# Patient Record
Sex: Female | Born: 1935 | Race: Black or African American | Hispanic: No | Marital: Single | Smoking: Never smoker
Health system: Southern US, Community
[De-identification: ages and names within clinical notes are randomized; demographics above are authoritative.]

## PROBLEM LIST (undated history)

## (undated) DIAGNOSIS — E7800 Pure hypercholesterolemia, unspecified: Secondary | ICD-10-CM

## (undated) DIAGNOSIS — E119 Type 2 diabetes mellitus without complications: Secondary | ICD-10-CM

## (undated) DIAGNOSIS — D136 Benign neoplasm of pancreas: Secondary | ICD-10-CM

## (undated) DIAGNOSIS — M1990 Unspecified osteoarthritis, unspecified site: Secondary | ICD-10-CM

## (undated) DIAGNOSIS — F411 Generalized anxiety disorder: Secondary | ICD-10-CM

## (undated) DIAGNOSIS — E669 Obesity, unspecified: Secondary | ICD-10-CM

## (undated) DIAGNOSIS — I440 Atrioventricular block, first degree: Secondary | ICD-10-CM

## (undated) DIAGNOSIS — D649 Anemia, unspecified: Secondary | ICD-10-CM

## (undated) DIAGNOSIS — K5730 Diverticulosis of large intestine without perforation or abscess without bleeding: Secondary | ICD-10-CM

## (undated) DIAGNOSIS — G43009 Migraine without aura, not intractable, without status migrainosus: Secondary | ICD-10-CM

## (undated) DIAGNOSIS — I10 Essential (primary) hypertension: Secondary | ICD-10-CM

## (undated) DIAGNOSIS — K219 Gastro-esophageal reflux disease without esophagitis: Secondary | ICD-10-CM

## (undated) DIAGNOSIS — I4892 Unspecified atrial flutter: Secondary | ICD-10-CM

## (undated) DIAGNOSIS — G4733 Obstructive sleep apnea (adult) (pediatric): Secondary | ICD-10-CM

## (undated) DIAGNOSIS — E559 Vitamin D deficiency, unspecified: Secondary | ICD-10-CM

## (undated) DIAGNOSIS — I509 Heart failure, unspecified: Secondary | ICD-10-CM

## (undated) DIAGNOSIS — J411 Mucopurulent chronic bronchitis: Secondary | ICD-10-CM

## (undated) DIAGNOSIS — K7460 Unspecified cirrhosis of liver: Secondary | ICD-10-CM

## (undated) DIAGNOSIS — D472 Monoclonal gammopathy: Secondary | ICD-10-CM

## (undated) DIAGNOSIS — H269 Unspecified cataract: Secondary | ICD-10-CM

## (undated) DIAGNOSIS — N189 Chronic kidney disease, unspecified: Secondary | ICD-10-CM

## (undated) HISTORY — DX: Generalized anxiety disorder: F41.1

## (undated) HISTORY — DX: Vitamin D deficiency, unspecified: E55.9

## (undated) HISTORY — DX: Obstructive sleep apnea (adult) (pediatric): G47.33

## (undated) HISTORY — DX: Migraine without aura, not intractable, without status migrainosus: G43.009

## (undated) HISTORY — DX: Unspecified osteoarthritis, unspecified site: M19.90

## (undated) HISTORY — DX: Monoclonal gammopathy: D47.2

## (undated) HISTORY — DX: Diverticulosis of large intestine without perforation or abscess without bleeding: K57.30

## (undated) HISTORY — PX: COLONOSCOPY: SHX174

## (undated) HISTORY — DX: Obesity, unspecified: E66.9

## (undated) HISTORY — DX: Mucopurulent chronic bronchitis: J41.1

## (undated) HISTORY — DX: Type 2 diabetes mellitus without complications: E11.9

## (undated) HISTORY — DX: Benign neoplasm of pancreas: D13.6

## (undated) HISTORY — DX: Pure hypercholesterolemia, unspecified: E78.00

## (undated) HISTORY — DX: Essential (primary) hypertension: I10

## (undated) HISTORY — DX: Anemia, unspecified: D64.9

---

## 1970-02-26 HISTORY — PX: ABDOMINAL HYSTERECTOMY: SHX81

## 2001-11-11 ENCOUNTER — Inpatient Hospital Stay (HOSPITAL_COMMUNITY): Admission: EM | Admit: 2001-11-11 | Discharge: 2001-11-15 | Payer: Self-pay | Admitting: Emergency Medicine

## 2001-11-11 ENCOUNTER — Encounter: Payer: Self-pay | Admitting: Pulmonary Disease

## 2001-11-11 ENCOUNTER — Encounter: Payer: Self-pay | Admitting: Emergency Medicine

## 2001-11-12 ENCOUNTER — Encounter: Payer: Self-pay | Admitting: Internal Medicine

## 2001-11-13 ENCOUNTER — Encounter: Payer: Self-pay | Admitting: Internal Medicine

## 2001-11-14 ENCOUNTER — Encounter: Payer: Self-pay | Admitting: Internal Medicine

## 2001-11-14 ENCOUNTER — Encounter (INDEPENDENT_AMBULATORY_CARE_PROVIDER_SITE_OTHER): Payer: Self-pay | Admitting: *Deleted

## 2002-02-26 HISTORY — PX: OTHER SURGICAL HISTORY: SHX169

## 2004-01-06 ENCOUNTER — Ambulatory Visit: Payer: Self-pay | Admitting: Pulmonary Disease

## 2004-01-27 ENCOUNTER — Ambulatory Visit: Payer: Self-pay | Admitting: Adult Health

## 2004-02-17 ENCOUNTER — Ambulatory Visit: Payer: Self-pay | Admitting: Pulmonary Disease

## 2004-03-02 ENCOUNTER — Ambulatory Visit: Payer: Self-pay | Admitting: Pulmonary Disease

## 2004-04-27 ENCOUNTER — Ambulatory Visit: Payer: Self-pay | Admitting: Pulmonary Disease

## 2004-09-07 ENCOUNTER — Ambulatory Visit: Payer: Self-pay | Admitting: Pulmonary Disease

## 2005-01-04 ENCOUNTER — Ambulatory Visit: Payer: Self-pay | Admitting: Pulmonary Disease

## 2005-01-08 ENCOUNTER — Ambulatory Visit: Payer: Self-pay | Admitting: Pulmonary Disease

## 2005-02-01 ENCOUNTER — Ambulatory Visit: Payer: Self-pay | Admitting: Pulmonary Disease

## 2005-07-19 ENCOUNTER — Ambulatory Visit: Payer: Self-pay | Admitting: Pulmonary Disease

## 2005-07-30 ENCOUNTER — Ambulatory Visit: Payer: Self-pay | Admitting: Pulmonary Disease

## 2006-01-24 ENCOUNTER — Ambulatory Visit: Payer: Self-pay | Admitting: Pulmonary Disease

## 2006-01-24 LAB — CONVERTED CEMR LAB
Bacteria, U Microscopic: NEGATIVE /hpf
Crystals: NEGATIVE
Nitrite: NEGATIVE
Urine Glucose: NEGATIVE mg/dL
Urobilinogen, UA: 0.2 (ref 0.0–1.0)

## 2006-01-31 ENCOUNTER — Ambulatory Visit: Payer: Self-pay | Admitting: Pulmonary Disease

## 2006-04-18 ENCOUNTER — Encounter: Admission: RE | Admit: 2006-04-18 | Discharge: 2006-04-18 | Payer: Self-pay | Admitting: Orthopedic Surgery

## 2006-08-12 ENCOUNTER — Ambulatory Visit: Payer: Self-pay | Admitting: Pulmonary Disease

## 2006-08-15 ENCOUNTER — Ambulatory Visit: Payer: Self-pay | Admitting: Pulmonary Disease

## 2006-08-15 LAB — CONVERTED CEMR LAB
ALT: 17 units/L (ref 0–40)
AST: 17 units/L (ref 0–37)
Albumin: 3.9 g/dL (ref 3.5–5.2)
Alkaline Phosphatase: 60 units/L (ref 39–117)
Alpha-1-Globulin: 4.1 % (ref 2.9–4.9)
Alpha-2-Globulin: 14.3 % — ABNORMAL HIGH (ref 7.1–11.8)
BUN: 22 mg/dL (ref 6–23)
Basophils Absolute: 0 10*3/uL (ref 0.0–0.1)
Calcium: 9.6 mg/dL (ref 8.4–10.5)
Chloride: 113 meq/L — ABNORMAL HIGH (ref 96–112)
Cholesterol: 141 mg/dL (ref 0–200)
Creatinine, Ser: 1 mg/dL (ref 0.4–1.2)
Eosinophils Absolute: 0.2 10*3/uL (ref 0.0–0.6)
GFR calc non Af Amer: 58 mL/min
HCT: 31.2 % — ABNORMAL LOW (ref 36.0–46.0)
Hgb A1c MFr Bld: 6.3 % — ABNORMAL HIGH (ref 4.6–6.0)
IgA: 176 mg/dL (ref 68–378)
IgM, Serum: 60 mg/dL (ref 60–263)
LDL Cholesterol: 59 mg/dL (ref 0–99)
M-Spike, %: 0.37
MCHC: 33.7 g/dL (ref 30.0–36.0)
MCV: 78.4 fL (ref 78.0–100.0)
Monocytes Relative: 10.1 % (ref 3.0–11.0)
Neutrophils Relative %: 57.2 % (ref 43.0–77.0)
Platelets: 379 10*3/uL (ref 150–400)
RBC: 3.98 M/uL (ref 3.87–5.11)
RDW: 14 % (ref 11.5–14.6)
Total Bilirubin: 0.3 mg/dL (ref 0.3–1.2)
Total CHOL/HDL Ratio: 2.1
Total Protein, Serum Electrophoresis: 7.8 g/dL (ref 6.0–8.3)
Triglycerides: 80 mg/dL (ref 0–149)

## 2006-12-07 DIAGNOSIS — F411 Generalized anxiety disorder: Secondary | ICD-10-CM | POA: Insufficient documentation

## 2006-12-07 DIAGNOSIS — M1990 Unspecified osteoarthritis, unspecified site: Secondary | ICD-10-CM | POA: Insufficient documentation

## 2007-02-10 ENCOUNTER — Ambulatory Visit: Payer: Self-pay | Admitting: Pulmonary Disease

## 2007-02-10 DIAGNOSIS — E7800 Pure hypercholesterolemia, unspecified: Secondary | ICD-10-CM | POA: Insufficient documentation

## 2007-02-10 DIAGNOSIS — E669 Obesity, unspecified: Secondary | ICD-10-CM | POA: Insufficient documentation

## 2007-02-10 DIAGNOSIS — K5730 Diverticulosis of large intestine without perforation or abscess without bleeding: Secondary | ICD-10-CM | POA: Insufficient documentation

## 2007-02-10 DIAGNOSIS — E1169 Type 2 diabetes mellitus with other specified complication: Secondary | ICD-10-CM | POA: Insufficient documentation

## 2007-02-10 DIAGNOSIS — J42 Unspecified chronic bronchitis: Secondary | ICD-10-CM | POA: Insufficient documentation

## 2007-02-10 DIAGNOSIS — G43009 Migraine without aura, not intractable, without status migrainosus: Secondary | ICD-10-CM | POA: Insufficient documentation

## 2007-02-10 DIAGNOSIS — D472 Monoclonal gammopathy: Secondary | ICD-10-CM

## 2007-02-10 DIAGNOSIS — I10 Essential (primary) hypertension: Secondary | ICD-10-CM

## 2007-02-10 DIAGNOSIS — D649 Anemia, unspecified: Secondary | ICD-10-CM

## 2007-02-19 ENCOUNTER — Emergency Department (HOSPITAL_COMMUNITY): Admission: EM | Admit: 2007-02-19 | Discharge: 2007-02-19 | Payer: Self-pay | Admitting: Emergency Medicine

## 2007-08-11 ENCOUNTER — Ambulatory Visit: Payer: Self-pay | Admitting: Pulmonary Disease

## 2007-08-16 DIAGNOSIS — D136 Benign neoplasm of pancreas: Secondary | ICD-10-CM | POA: Insufficient documentation

## 2007-08-16 LAB — CONVERTED CEMR LAB
Albumin ELP: 55.8 % (ref 55.8–66.1)
Beta Globulin: 6.2 % (ref 4.7–7.2)
Gamma Globulin: 14.6 % (ref 11.1–18.8)
IgA: 213 mg/dL (ref 68–378)
IgG (Immunoglobin G), Serum: 1260 mg/dL (ref 694–1618)
M-Spike, %: 0.41
Vit D, 1,25-Dihydroxy: 20 — ABNORMAL LOW (ref 30–89)

## 2007-08-18 LAB — CONVERTED CEMR LAB
ALT: 15 units/L (ref 0–35)
AST: 17 units/L (ref 0–37)
Albumin: 3.7 g/dL (ref 3.5–5.2)
Alkaline Phosphatase: 65 units/L (ref 39–117)
BUN: 16 mg/dL (ref 6–23)
Bilirubin, Direct: 0.1 mg/dL (ref 0.0–0.3)
CO2: 26 meq/L (ref 19–32)
Cholesterol: 223 mg/dL (ref 0–200)
Direct LDL: 142.5 mg/dL
Eosinophils Relative: 11 % — ABNORMAL HIGH (ref 0.0–5.0)
Glucose, Bld: 124 mg/dL — ABNORMAL HIGH (ref 70–99)
HCT: 30 % — ABNORMAL LOW (ref 36.0–46.0)
HDL: 55.5 mg/dL (ref >39.0)
Hgb A1c MFr Bld: 6.4 % — ABNORMAL HIGH (ref 4.6–6.0)
Lymphocytes Relative: 20.9 % (ref 12.0–46.0)
Monocytes Relative: 7.7 % (ref 3.0–12.0)
Platelets: 399 10*3/uL (ref 150–400)
Potassium: 4.4 meq/L (ref 3.5–5.1)
RBC: 3.83 M/uL — ABNORMAL LOW (ref 3.87–5.11)
RDW: 13.4 % (ref 11.5–14.6)
Saturation Ratios: 16.9 % — ABNORMAL LOW (ref 20.0–50.0)
Sodium: 139 meq/L (ref 135–145)
TSH: 1.32 microintl units/mL (ref 0.35–5.50)
Total CHOL/HDL Ratio: 4
Total Protein: 7.1 g/dL (ref 6.0–8.3)
VLDL: 21 mg/dL (ref 0–40)

## 2008-02-09 ENCOUNTER — Ambulatory Visit: Payer: Self-pay | Admitting: Pulmonary Disease

## 2008-02-09 DIAGNOSIS — E559 Vitamin D deficiency, unspecified: Secondary | ICD-10-CM | POA: Insufficient documentation

## 2008-02-15 LAB — CONVERTED CEMR LAB
ALT: 22 units/L (ref 0–35)
Basophils Absolute: 0.1 10*3/uL (ref 0.0–0.1)
Basophils Relative: 1 % (ref 0.0–3.0)
CO2: 24 meq/L (ref 19–32)
Calcium: 9.8 mg/dL (ref 8.4–10.5)
Cholesterol: 159 mg/dL (ref 0–200)
Creatinine, Ser: 1.2 mg/dL (ref 0.4–1.2)
Glucose, Bld: 126 mg/dL — ABNORMAL HIGH (ref 70–99)
HCT: 31.2 % — ABNORMAL LOW (ref 36.0–46.0)
Hemoglobin: 10.4 g/dL — ABNORMAL LOW (ref 12.0–15.0)
Hgb A1c MFr Bld: 6.1 % — ABNORMAL HIGH (ref 4.6–6.0)
LDL Cholesterol: 58 mg/dL (ref 0–99)
Lymphocytes Relative: 19.9 % (ref 12.0–46.0)
MCHC: 33.3 g/dL (ref 30.0–36.0)
Monocytes Absolute: 0.6 10*3/uL (ref 0.1–1.0)
Neutro Abs: 4.6 10*3/uL (ref 1.4–7.7)
RBC: 3.94 M/uL (ref 3.87–5.11)
Total Protein: 7.2 g/dL (ref 6.0–8.3)
Vit D, 1,25-Dihydroxy: 30 (ref 30–89)

## 2008-07-12 IMAGING — CR DG KNEE 1-2V*L*
2 series · 2 of 2 positions shown · non-contrast
Comparison: [HOSPITAL] at [HOSPITAL] right knee radiograph, 04/18/06.

CLINICAL DATA: Bilateral anterior, right greater than left, knee pain for many years.
 LEFT KNEE ? TWO VIEWS:

[view not recorded (1 of 2)]
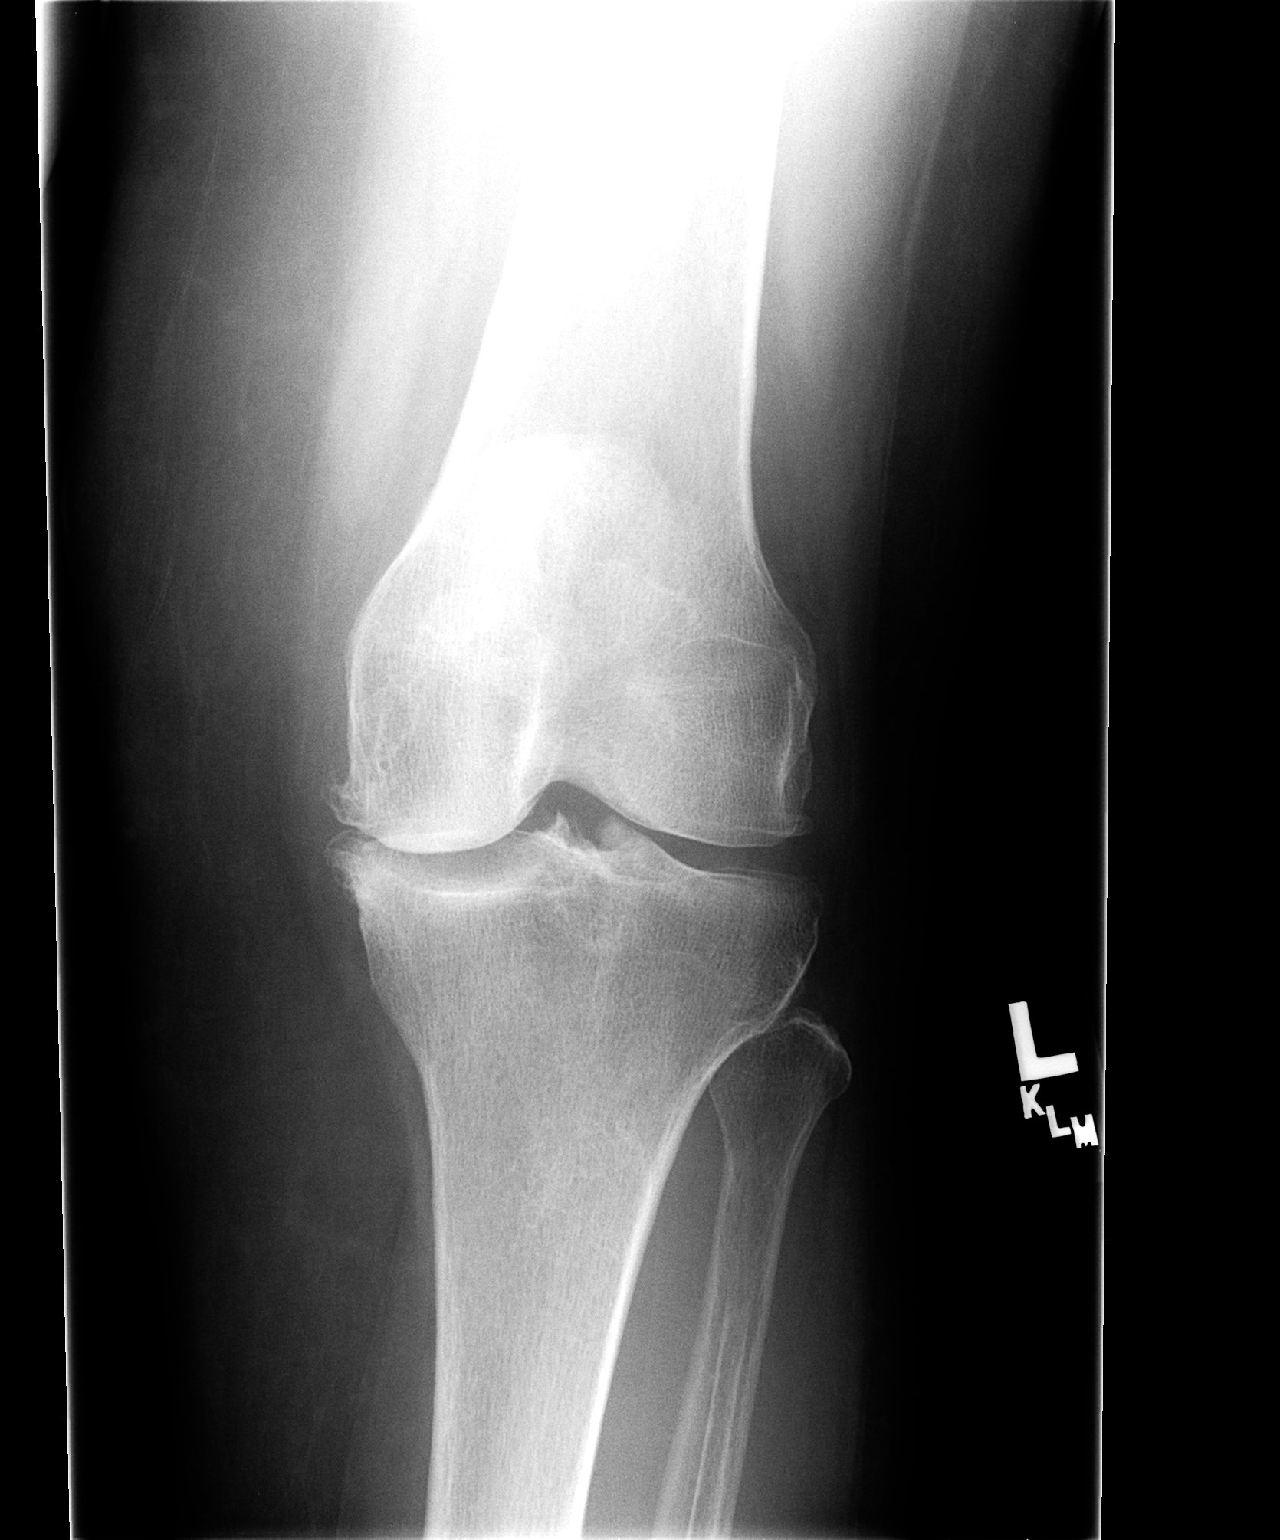

[view not recorded (2 of 2)]
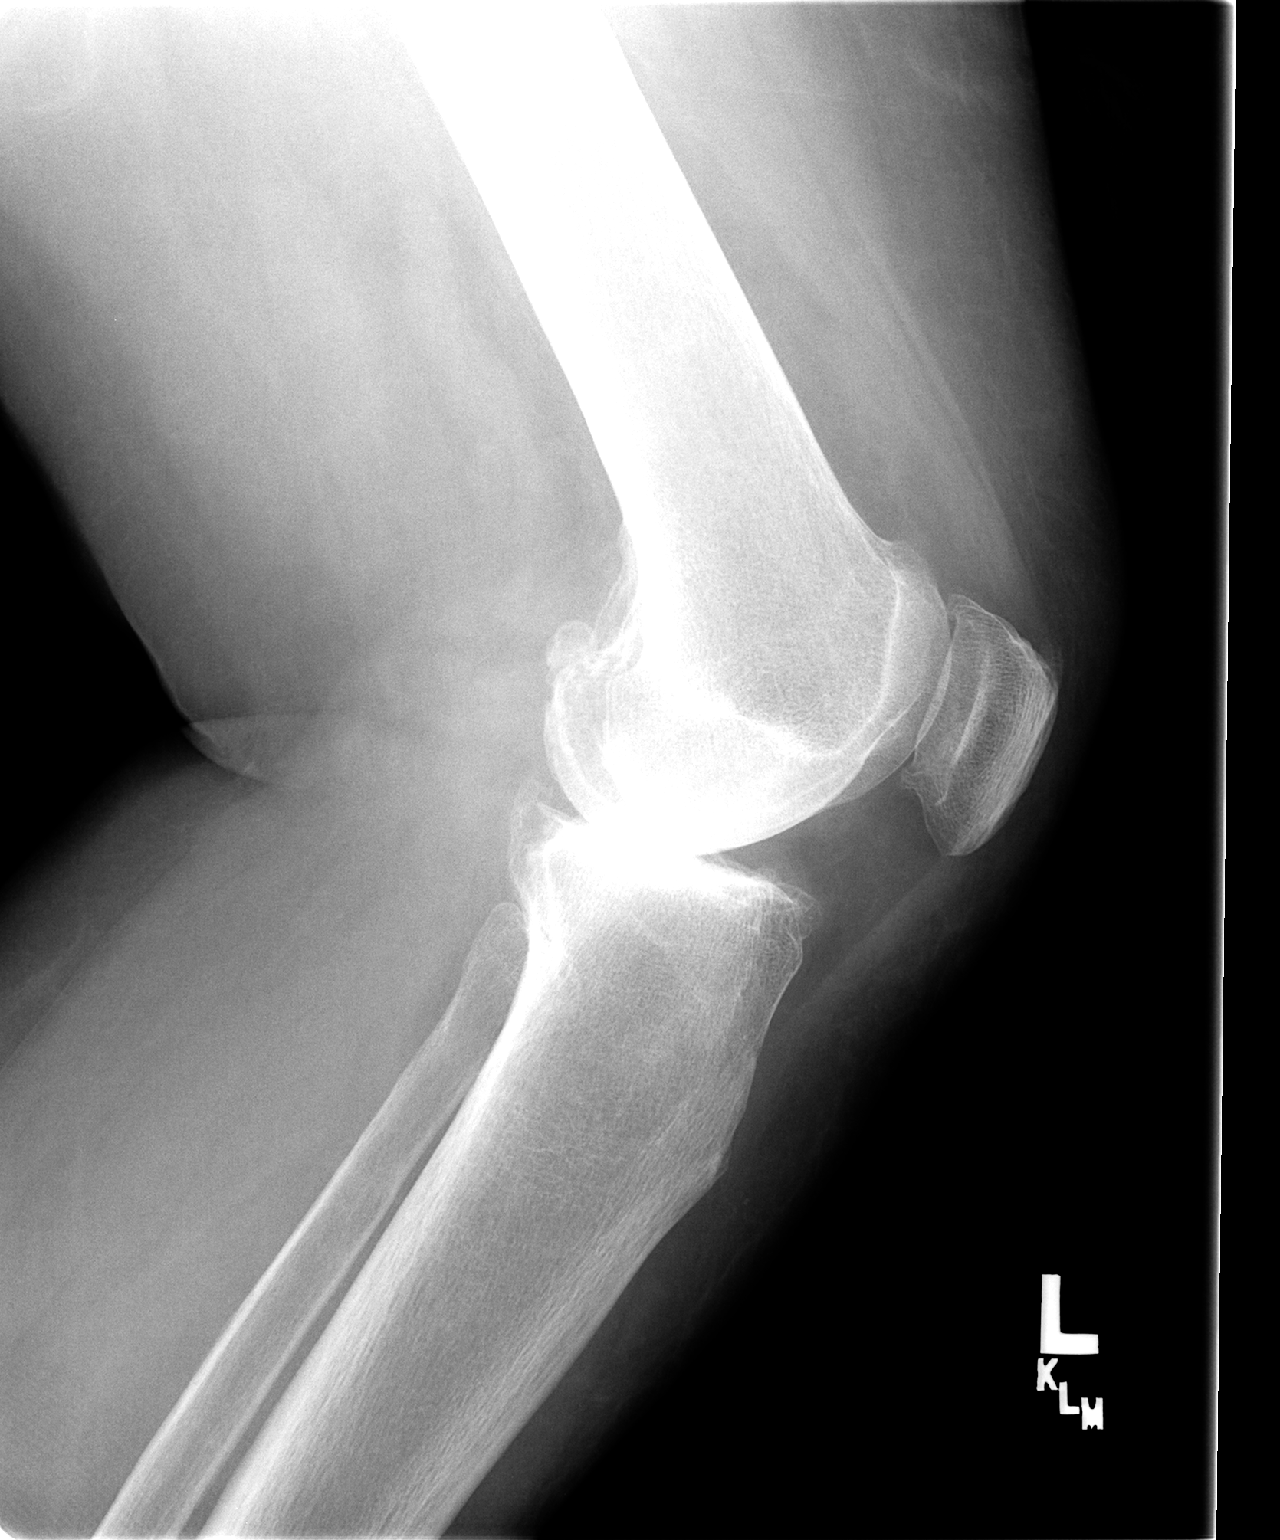

[2 of 2 positions shown; findings below may reference images not displayed]

FINDINGS: Advanced medial and patellofemoral osteoarthritic changes seen without effusion.  Degenerative changes are seen at the tibial spines.  No acute findings are seen.
IMPRESSION: 1.  Advanced medial and patellofemoral osteoarthritic changes.
 2.  Otherwise no significant abnormality.

## 2008-07-12 IMAGING — CR DG KNEE 1-2V*R*
2 series · 2 of 2 positions shown · non-contrast
Comparison: Left knee radiographs 04/18/06.

CLINICAL DATA: Right greater than left knee pain for years. 
 RIGHT KNEE TWO VIEWS:

[view not recorded (1 of 2)]
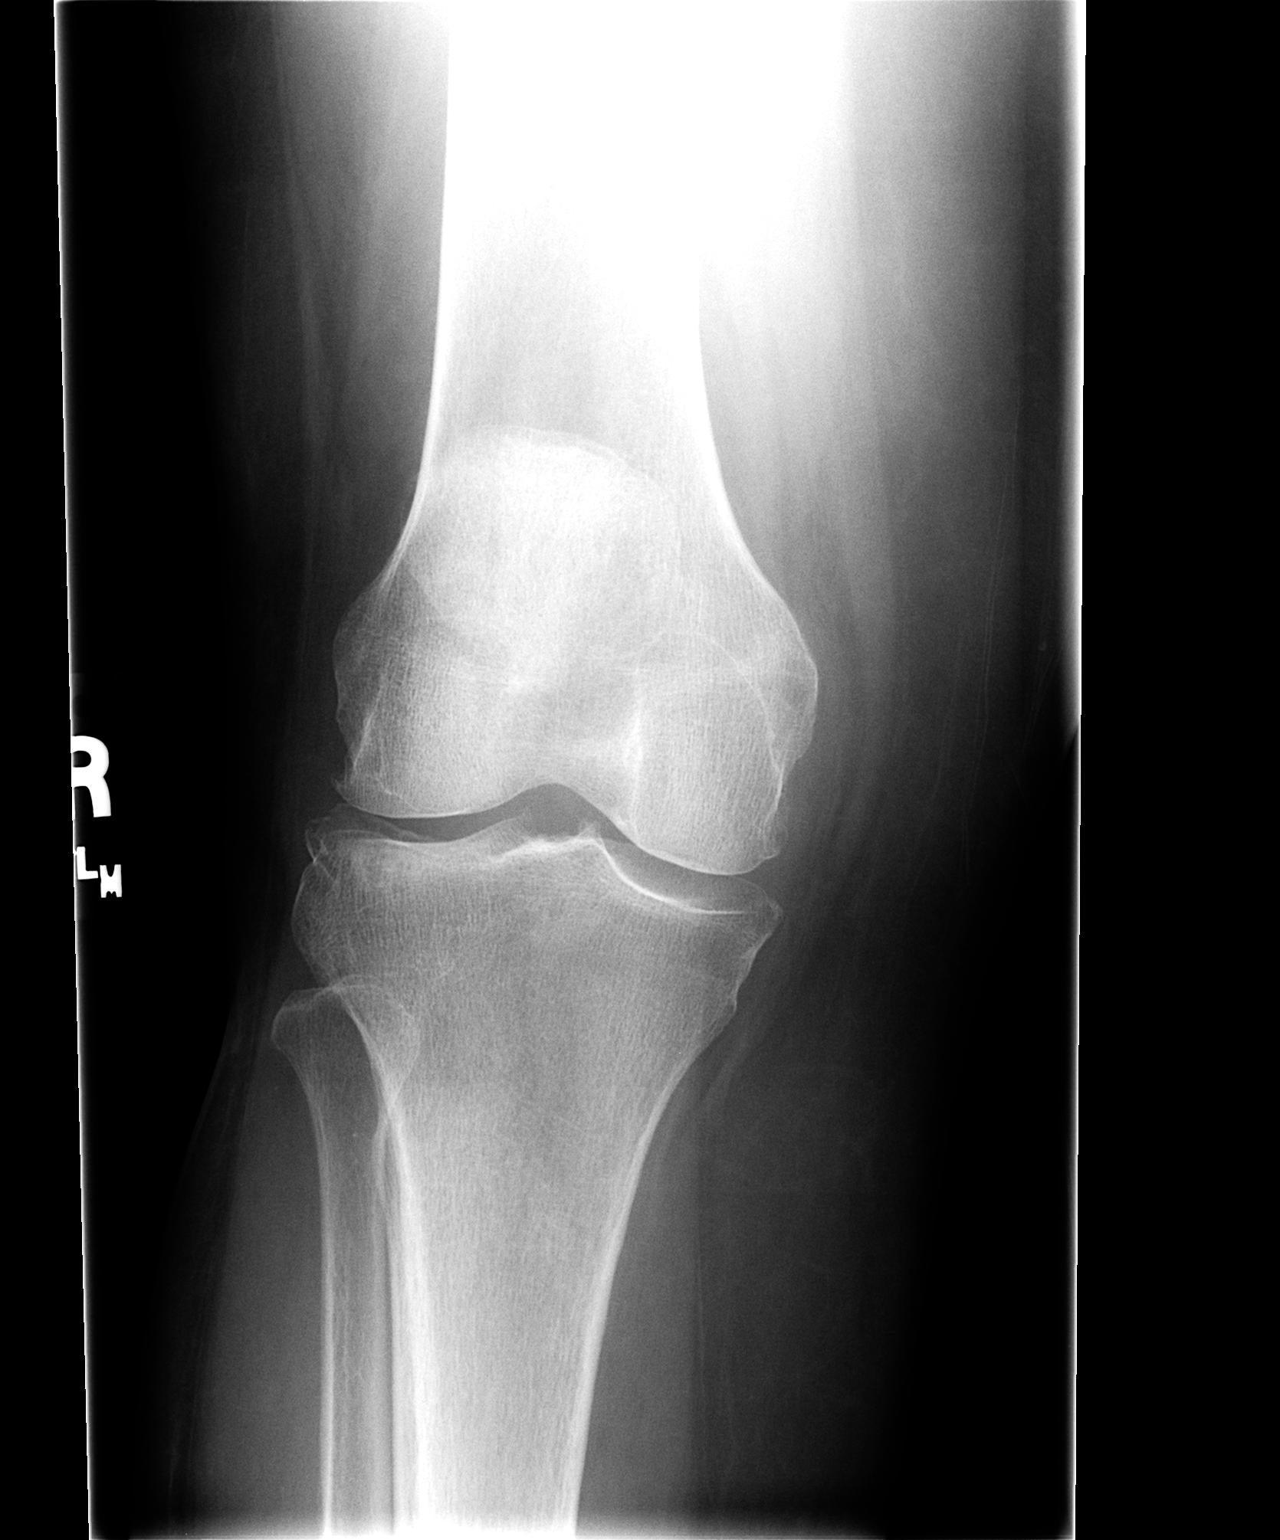

[view not recorded (2 of 2)]
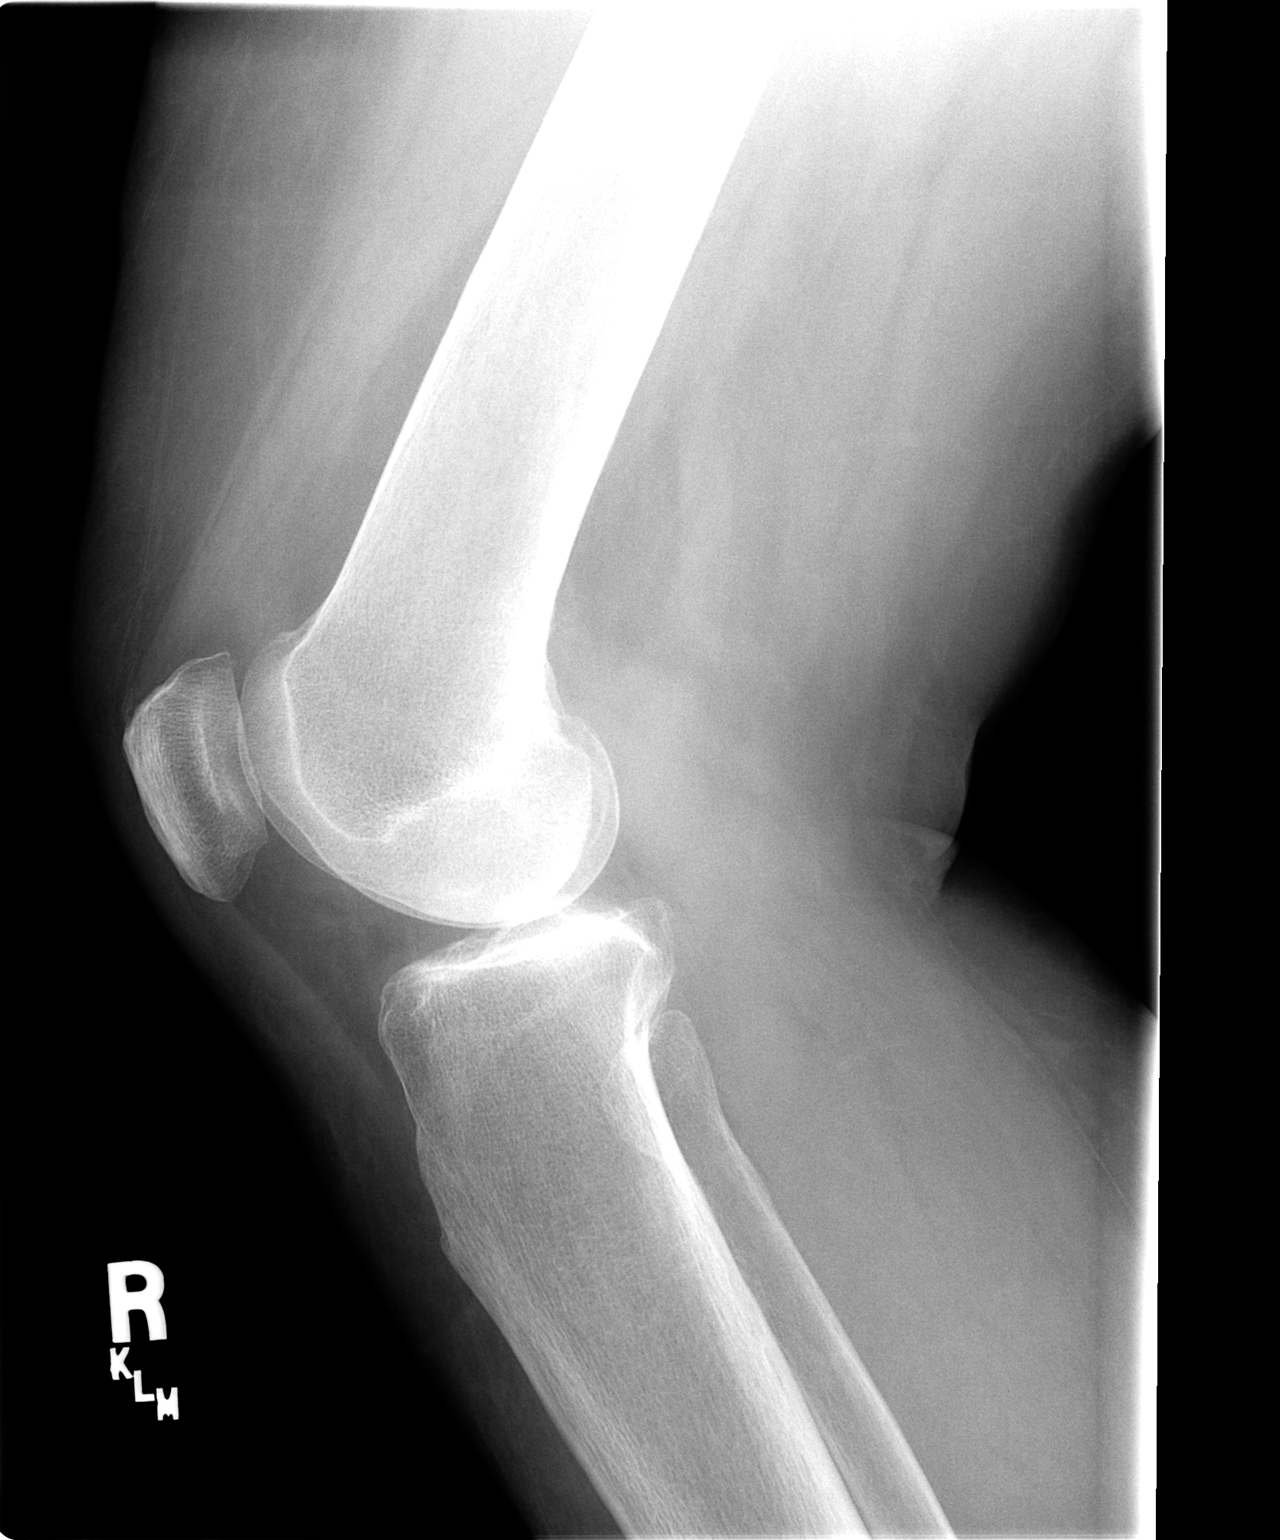

[2 of 2 positions shown; findings below may reference images not displayed]

FINDINGS: Slight patella femoral, medial, and moderate lateral compartment arthritic changes are seen with tiny suprapatellar bursal effusion.  No other significant abnormality is noted.
IMPRESSION: 1.  Tricompartment osteoarthritis most marked at the lateral compartment with associated tiny suprapatellar bursal effusion. 
 2.  Otherwise negative.

## 2008-11-29 ENCOUNTER — Ambulatory Visit: Payer: Self-pay | Admitting: Pulmonary Disease

## 2008-11-30 LAB — CONVERTED CEMR LAB
ALT: 15 units/L (ref 0–35)
AST: 18 units/L (ref 0–37)
Alkaline Phosphatase: 63 units/L (ref 39–117)
BUN: 25 mg/dL — ABNORMAL HIGH (ref 6–23)
Bilirubin, Direct: 0.1 mg/dL (ref 0.0–0.3)
Calcium: 9.5 mg/dL (ref 8.4–10.5)
GFR calc non Af Amer: 69.25 mL/min (ref >60)
Glucose, Bld: 122 mg/dL — ABNORMAL HIGH (ref 70–99)
Total Bilirubin: 0.7 mg/dL (ref 0.3–1.2)

## 2008-12-14 ENCOUNTER — Encounter: Payer: Self-pay | Admitting: Pulmonary Disease

## 2008-12-27 ENCOUNTER — Ambulatory Visit: Payer: Self-pay | Admitting: Gastroenterology

## 2009-01-10 ENCOUNTER — Ambulatory Visit: Payer: Self-pay | Admitting: Gastroenterology

## 2009-01-10 ENCOUNTER — Encounter: Payer: Self-pay | Admitting: Gastroenterology

## 2009-01-11 ENCOUNTER — Encounter: Payer: Self-pay | Admitting: Gastroenterology

## 2009-05-16 ENCOUNTER — Ambulatory Visit: Payer: Self-pay | Admitting: Pulmonary Disease

## 2009-05-21 LAB — CONVERTED CEMR LAB
Alpha-2-Globulin: 14.6 % — ABNORMAL HIGH (ref 7.1–11.8)
BUN: 20 mg/dL (ref 6–23)
Beta Globulin: 6.7 % (ref 4.7–7.2)
Chloride: 112 meq/L (ref 96–112)
Creatinine, Ser: 1.2 mg/dL (ref 0.4–1.2)
GFR calc non Af Amer: 56.04 mL/min (ref >60)
Hgb A1c MFr Bld: 6.3 % (ref 4.6–6.5)
Total Protein, Serum Electrophoresis: 7.2 g/dL (ref 6.0–8.3)
Vit D, 25-Hydroxy: 28 ng/mL — ABNORMAL LOW (ref 30–89)

## 2009-09-22 ENCOUNTER — Encounter: Payer: Self-pay | Admitting: Adult Health

## 2010-01-23 ENCOUNTER — Ambulatory Visit: Payer: Self-pay | Admitting: Pulmonary Disease

## 2010-01-24 ENCOUNTER — Encounter: Admission: RE | Admit: 2010-01-24 | Discharge: 2010-01-24 | Payer: Self-pay | Admitting: Orthopedic Surgery

## 2010-01-26 ENCOUNTER — Ambulatory Visit: Payer: Self-pay | Admitting: Pulmonary Disease

## 2010-01-31 LAB — CONVERTED CEMR LAB
AST: 15 units/L (ref 0–37)
Alkaline Phosphatase: 63 units/L (ref 39–117)
Basophils Absolute: 0 10*3/uL (ref 0.0–0.1)
Bilirubin, Direct: 0 mg/dL (ref 0.0–0.3)
GFR calc non Af Amer: 63.85 mL/min (ref >60)
Glucose, Bld: 103 mg/dL — ABNORMAL HIGH (ref 70–99)
HCT: 27.9 % — ABNORMAL LOW (ref 36.0–46.0)
Lymphs Abs: 1.2 10*3/uL (ref 0.7–4.0)
MCHC: 34.3 g/dL (ref 30.0–36.0)
MCV: 80.1 fL (ref 78.0–100.0)
Monocytes Absolute: 0.6 10*3/uL (ref 0.1–1.0)
Neutro Abs: 3 10*3/uL (ref 1.4–7.7)
Platelets: 334 10*3/uL (ref 150.0–400.0)
Potassium: 5.2 meq/L — ABNORMAL HIGH (ref 3.5–5.1)
RDW: 15.2 % — ABNORMAL HIGH (ref 11.5–14.6)
Sodium: 138 meq/L (ref 135–145)
TSH: 0.44 microintl units/mL (ref 0.35–5.50)
Total Bilirubin: 0.4 mg/dL (ref 0.3–1.2)
Total CHOL/HDL Ratio: 3
VLDL: 12.4 mg/dL (ref 0.0–40.0)

## 2010-03-28 NOTE — Medication Information (Signed)
Summary: Drug interaction/[facility]  Drug interaction/[facility]   Imported By: Sherian Rein 09/26/2009 09:24:20  _____________________________________________________________________  External Attachment:    Type:   Image     Comment:   External Document

## 2010-03-28 NOTE — Assessment & Plan Note (Signed)
Summary: 63m reck/klw   CC:  8 month ROV & review of mult medical problems....  History of Present Illness: 77 y/o BF here for a follow up visit... she has multiple medical problems including HBP;  Hyperchol & DM;  Obesity;  Divertics;  DJD;  & Anemia w/ MGUS...  she still works at [employer]	[place] 3 d per week... her granddau is a Optometrist in [employer] Environmental manager residency).   ~  May 16, 2009:  I last saw Wendy Kerr 12/09- notes reviewed... she is due for f/u of her MGUS... she hasn't been able to lose weight-  BP controlled;  BS under control w/ A1c's in the low 6's;  Chol fair on the Simva80...  we discussed diet + exercise & weight reduction strategies...   ~  January 23, 2010:  despite all her best efforts she has gained 7# up to 207# today... BP remains under control;  FLP looks reasonably well controlled on Simva40;  BS & A1c continue to look good on Metformin & Actos (I told her she could save $$ & stop the actos if she got her wt down)...    Current Problem List:  Hx of BRONCHITIS, RECURRENT (ICD-491.9)  HYPERTENSION (ICD-401.9) - on LABETOLOL 200mg Bid, CARDIZEM CD 240mg /d, HYZAAR 100-25 daily... BP today = 132/60 and tolerates meds well... denies HA, fatigue, visual changes, CP, palipit, dizziness, syncope, dyspnea, edema, etc... she needs to do a better job restricting sodium & losing weight!  DIABETES MELLITUS (ICD-250.00) - on METFORMIN 500mg Bid, ACTOS 15mg /d... she has a difficult time w/ diet & exercise esp due to her knee arthritis & we discussed swimming etc...Marland KitchenMarland Kitchen. BS at home betw 110-140 she says...  ~  labs 6/08 showed BS= 124, HgA1c= 6.3  ~  labs 6/09 showed BS= 124, HgA1c= 6.4.Marland KitchenMarland Kitchen rec- same meds, better diet!  ~  labs 12/09 showed BS= 126, A1c= 6.1  ~  labs 10/10 showed BS= 122, A1c= 6.1  ~  labs 3/11 showed BS= 120, A1c= 6.3  ~  labs 12/11 showed BS= 103, A1c= 6.1  HYPERCHOLESTEROLEMIA (ICD-272.0) - now on SIMVASTATIN 40mg /d (prev Vytorin, then Simva80).  ~  FLP 6/08  showed TChol 141, TG 80, HDL 66, LDL 59... LFT's were normal.  ~  FLP 6/09 on Vytorin10/40 showed TChol 223, TG 105, HDL 56, LDL 143... rec- incr to 10/80...  ~  FLP 12/09 on Vytorin 10/80 showed TChol 159, TG 62, HDL 89, LDL 58... ch to Simva80 for $$  ~  FLP 10/10 on Simva80 showed TChol 187, TG 78, HDL 64, LDL 107  ~  Simvastatin was decreased to 40mg /d based on the new guidelines...  ~  FLP 12/11 on simva40 showed TChol 170, TG 62, HDL 68, LDL 90  OBESITY (ICD-278.00) - we discussed diet + exercise required to lose weight...  ~  weight 1/07 = 188#  ~  weight 12/08 = 202#  ~  weight 12/09 = 201#  ~  weight 3/11 = 201#  ~  weight 11/11 = 207#  DIVERTICULOSIS OF COLON (ICD-562.10) - there is a family hx of colon cancer...  ~  colonoscopy 2/03 by DrSam showed divertics only...   ~  f/u colonoscopy 11/10 by DrJacobs showed 1 sm polyp= tubular adenoma, & divertics...  Hx of BEN NEOPLASM PANCREAS EXCEPT ISLETS LANGERHANS (ICD-211.6) - she is s/p resection of a serous cystadenoma of the pancreas at [facility] by DrTyler in 2004... she has a routine f/u appt sched yearly> now every other yr.  DEGENERATIVE JOINT  DISEASE (ICD-715.90) - uses ETODOLAC 400mg Bid as needed... she recently saw DrDean w/ shots in her knees...  ~  11/11:  she reports early CTS per DrDean & Rx w/ wrist splints & improved...  VITAMIN D DEFICIENCY (ICD-268.9) - Vit D level 6/09 was 20 & pt Rx w/ 50,000 u weekly...  ~  labs 12/09 showed Vit D level = 30... she chose to continue the 50K weekly.  ~  labs 3/11 showed Vit D level = 28... ?taking 50K each week?- continue.  Hx of COMMON MIGRAINE (ICD-346.10) -prev eval by DrAdelman in the past...  ANXIETY DISORDER, GENERALIZED (ICD-300.02)  ANEMIA NOS (ICD-285.9) - on FeSO4 + Vit C daily...  ~  labs 6/09 showed Hg ~ 10, Hct= 30, MCV= 78, Fe= 70...  ~  labs 12/09 showed Hg= 10.4, MCV= 79...  ~  labs 3/11 showed Hg= not done...  ~  labs 11/11 showed Hg= 9.5, MVC= 80, Fe= 61  (12%sat)  MONOCLONAL GAMMOPATHY (ICD-273.1) - MGUS diagnosed w/ prev anemia work up... she has a monoclonal IgG Kappa paraprotein identified... we have been following SPE/ IEP yearly without any progression...   ~  labs 6/08 showed monoclonal IgG kappa protein- 0.37g/dl & quant Ig's showed IgG level = 978 mg/dl.  ~  labs 8/65 showed monoclonal IgG kappa protein- 0.41 g/dl & quant Ig's showed IgG level = 1260 mg/dl.  ~  labs 7/84 showed monoclonal IgG kappa protein- 0.37g/dl & quant Ig's showed IgG level = 1190 mg/dl.   Preventive Screening-Counseling & Management  Alcohol-Tobacco     Smoking Status: never  Allergies: 1)  Feldene  Comments:  Nurse/Medical Assistant: The patient's medications and allergies were reviewed with the patient and were updated in the Medication and Allergy Lists.  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 mother died age 73 from kidney failure father died age 70 from kidney failure no siblings  Social History: Reviewed history from 11/29/2008 and no changes required. 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  The patient denies anorexia, fever, weight loss, weight gain, vision loss,  decreased hearing, hoarseness, chest pain, syncope, peripheral edema, prolonged cough, headaches, hemoptysis, abdominal pain, melena, hematochezia, severe indigestion/heartburn, hematuria, incontinence, muscle weakness, suspicious skin lesions, transient blindness, difficulty walking, depression, unusual weight change, abnormal bleeding, enlarged lymph nodes, and angioedema.    Vital Signs:  Patient profile:   77 year old female Height:      64 inches Weight:      204.38 pounds BMI:     35.21 O2 Sat:      98 % on Room air Temp:     98.4 degrees F oral Pulse rate:   80 / minute BP sitting:   132 / 60  (left arm) Cuff size:   large  Vitals Entered By: Randell Loop CMA (January 23, 2010 2:33 PM)  O2 Sat at Rest %:  98  O2 Flow:  Room air CC: 8 month ROV & review of mult medical problems... Is Patient Diabetic? Yes Pain Assessment Patient in pain? no      Comments no changes in meds today   Physical Exam  Additional Exam:  WD, Overweight,  77 y/o BF in NAD... GENERAL:  Alert & oriented; pleasant & cooperative... HEENT:  /AT, EOM-wnl, PERRLA, EACs-clear, TMs-wnl, NOSE-clear, THROAT-clear & wnl. NECK:  Supple w/ fairROM; no JVD; normal carotid impulses w/o bruits; no thyromegaly or nodules palpated; no lymphadenopathy. CHEST:  Clear to P & A; without wheezes/ rales/ or rhonchi. HEART:  Regular Rhythm; without murmurs/ rubs/ or gallops. ABDOMEN:  Soft & nontender; scar of prev surg; normal bowel sounds; no organomegaly or masses detected. EXT: without deformities, mod arthritic changes; no varicose veins/ +venous insuffic/ no edema. NEURO:  CN's intact;  no focal neuro deficits... DERM:  No lesions noted; no rash etc...    MISC. Report  Procedure date:  01/23/2010  Findings:      DATA REVIEWED:  ~  she will ret for FASTING blood work 01/26/10 & we will communicate to her the results and any needed change in Rx...   Impression & Recommendations:  Problem # 1:   HYPERTENSION (ICD-401.9) BP controlled>  same meds... Her updated medication list for this problem includes:    Labetalol Hcl 200 Mg Tabs (Labetalol hcl) .Marland Kitchen... Take 1 tablet by mouth two times a day    Hyzaar 100-25 Mg Tabs (Losartan potassium-hctz) .Marland Kitchen... Take 1 tablet by mouth once a day    Cardizem Cd 240 Mg Cp24 (Diltiazem hcl coated beads) .Marland Kitchen... Take 1 tablet by mouth once a day  Problem # 2:  HYPERCHOLESTEROLEMIA (ICD-272.0) FLP looks good on the Simva40 + diet> keep up the good work & get the wt down! Her updated medication list for this problem includes:    Simvastatin 40 Mg Tabs (Simvastatin) .Marland Kitchen... 1 by mouth at bedtime  Problem # 3:  DIABETES MELLITUS (ICD-250.00) DM control remains good>  she knows that shwe wll likely be able to decr meds if she loses weight. Her updated medication list for this problem includes:    Adult Aspirin Low Strength 81 Mg Tbdp (Aspirin) .Marland Kitchen... 1 tab daily    Hyzaar 100-25 Mg Tabs (Losartan potassium-hctz) .Marland Kitchen... Take 1 tablet by mouth once a day    Metformin Hcl 500 Mg Tabs (Metformin hcl) .Marland Kitchen... 1 tab by mouth twice daily    Actos 15 Mg Tabs (Pioglitazone hcl) .Marland Kitchen... 1 tab daily  Problem # 4:  OBESITY (ICD-278.00) Weight reduction is key!!!  Problem # 5:  DIVERTICULOSIS OF COLON (ICD-562.10) GI is stable...  Problem # 6:  DEGENERATIVE JOINT DISEASE (ICD-715.90) DJD followed by DrDean... Her updated medication list for this problem includes:    Adult Aspirin Low Strength 81 Mg Tbdp (Aspirin) .Marland Kitchen... 1 tab daily    Etodolac 400 Mg Tabs (Etodolac) .Marland Kitchen... Take 1 tablet two times a day as needed arthritis pain.  Problem # 7:  VITAMIN D DEFICIENCY (ICD-268.9) Continue Vit D 50K weekly...  Problem # 8:  ANEMIA NOS (ICD-285.9) Hg is sl lower but prev MGUS f/u was stable & due for repeat in the spring f/u visit... Her updated medication list for this problem includes:    Ferrous Sulfate 324 Mg Tbec (Ferrous sulfate) .Marland Kitchen... Take 1 tab by mouth once daily w/  a 500mg  vit c tab...  Complete Medication List: 1)  Allergy Vaccine  .... Once weekly 2)  Adult Aspirin Low Strength 81 Mg Tbdp (Aspirin) .Marland Kitchen.. 1 tab daily 3)  Labetalol Hcl 200 Mg Tabs (Labetalol hcl) .... Take 1 tablet by mouth two times a day 4)  Hyzaar 100-25 Mg Tabs (Losartan potassium-hctz) .... Take 1 tablet by mouth once a day 5)  Cardizem Cd 240 Mg Cp24 (Diltiazem hcl coated beads) .... Take 1 tablet by mouth once a day 6)  Simvastatin 40 Mg Tabs (Simvastatin) .Marland Kitchen.. 1 by mouth at bedtime 7)  Metformin Hcl 500 Mg Tabs (Metformin hcl) .Marland Kitchen.. 1 tab by mouth twice daily 8)  Actos 15 Mg Tabs (Pioglitazone hcl) .Marland Kitchen.. 1 tab daily 9)  Etodolac 400 Mg Tabs (Etodolac) .... Take 1 tablet two times a day as needed arthritis pain. 10)  Vitamin D 82956 Unit Caps (Ergocalciferol) .... Take one tablet by mouth once a week 11)  Ferrous Sulfate 324 Mg Tbec (Ferrous sulfate) .... Take 1 tab by mouth once daily w/ a 500mg  vit c tab...  Other Orders: Influenza Vaccine MCR (21308)  Patient Instructions: 1)  Today we updated your med list- see below.... 2)  We refilled your meds per request... 3)  Please return to our lab this week for your FASTING blood work... 4)  Then please call the "phone tree" in a few days for your lab results.Marland KitchenMarland Kitchen  5)  Let'sd get on track w/ our diet + exercise program> we discvussed some good ideas in this regard today... 6)  Call for any questions.Marland KitchenMarland Kitchen 7)  Please schedule a follow-up appointment in 6 months. Prescriptions: FERROUS SULFATE 324 MG TBEC (FERROUS SULFATE) take 1 tab by mouth once daily w/ a 500mg  Vit C tab...  #30 x 12   Entered and Authorized by:   Michele Mcalpine MD   Signed by:   Michele Mcalpine MD on 01/23/2010   Method used:   Print then Give to Patient   RxID:   [id number] VITAMIN D 24401 UNIT  CAPS (ERGOCALCIFEROL) take one tablet by mouth once a week  #4 x 12   Entered and Authorized by:   Michele Mcalpine MD   Signed by:   Michele Mcalpine MD on 01/23/2010    Method used:   Print then Give to Patient   RxID:   [id number] ETODOLAC 400 MG  TABS (ETODOLAC) take 1 tablet two times a day as needed arthritis pain.  #60 x 6   Entered and Authorized by:   Michele Mcalpine MD   Signed by:   Michele Mcalpine MD on 01/23/2010   Method used:   Print then Give to Patient   RxID:   [id number] SIMVASTATIN 40 MG TABS (SIMVASTATIN) 1 by mouth at bedtime  #30 x 12   Entered and Authorized by:   Michele Mcalpine MD   Signed by:   Michele Mcalpine MD on 01/23/2010   Method used:   Print then Give to Patient   RxID:   [id number] ACTOS 15 MG  TABS (PIOGLITAZONE HCL) 1 tab daily  #30 x 12   Entered and Authorized by:   Michele Mcalpine MD   Signed by:   Michele Mcalpine MD on 01/23/2010   Method used:   Print then Give to Patient   RxID:   [id number] METFORMIN HCL 500 MG TABS (METFORMIN HCL) 1 tab by mouth twice daily  #60 x 12   Entered and Authorized by:   Michele Mcalpine MD   Signed by:   Lonzo Cloud  Kriste Basque MD on 01/23/2010   Method used:   Print then Give to Patient   RxID:   [id number] CARDIZEM CD 240 MG  CP24 (DILTIAZEM HCL COATED BEADS) Take 1 tablet by mouth once a day  #30 x 12   Entered and Authorized by:   Michele Mcalpine MD   Signed by:   Michele Mcalpine MD on 01/23/2010   Method used:   Print then Give to Patient   RxID:   [id number] HYZAAR 100-25 MG  TABS (LOSARTAN POTASSIUM-HCTZ) Take 1 tablet by mouth once a day  #30 x 12   Entered and Authorized by:   Michele Mcalpine MD   Signed by:   Michele Mcalpine MD on 01/23/2010   Method used:   Print then Give to Patient   RxID:   [id number] LABETALOL HCL 200 MG  TABS (LABETALOL HCL) take 1 tablet by mouth two times a day  #60 x 12   Entered and Authorized by:   Michele Mcalpine MD   Signed by:   Michele Mcalpine MD on 01/23/2010   Method used:   Print then Give to Patient   RxID:   [id number]    Immunizations Administered:  Influenza Vaccine # 1:    Vaccine Type: Fluvax MCR    Site:  left deltoid    Mfr: GlaxoSmithKline    Dose: 0.5 ml    Route: IM    Given by: Randell Loop CMA    Exp. Date: 08/26/2010    Lot #: [id number]    VIS given: 09/20/09 version given January 23, 2010.  Flu Vaccine Consent Questions:    Do you have a history of severe allergic reactions to this vaccine? no    Any prior history of allergic reactions to egg and/or gelatin? no    Do you have a sensitivity to the preservative Thimersol? no    Do you have a past history of Guillan-Barre Syndrome? no    Do you currently have an acute febrile illness? no    Have you ever had a severe reaction to latex? no    Vaccine information given and explained to patient? yes    Are you currently pregnant? no

## 2010-03-28 NOTE — Miscellaneous (Signed)
Summary: Simvastatin changed to 40mg  -per pharm  recs   Pharm recs for decreased dose of simvastatin recent literature avoid simvastatin 80mg   will change to simvastatin 40mg  at bedtime   Clinical Lists Changes  Medications: Changed medication from SIMVASTATIN 80 MG TABS (SIMVASTATIN) take 1 tab by mouth at bedtime... to SIMVASTATIN 40 MG TABS (SIMVASTATIN) 1 by mouth at bedtime - Signed Rx of SIMVASTATIN 40 MG TABS (SIMVASTATIN) 1 by mouth at bedtime;  #30 x 5;  Signed;  Entered by: Cassadie Pankonin NP;  Authorized by: Rubye Oaks NP;  Method used: [facility] [phone]*, [address] [place], [place], [place]  [id number], Ph: [phone] or [phone], Fax: [phone]    Prescriptions: SIMVASTATIN 40 MG TABS (SIMVASTATIN) 1 by mouth at bedtime  #30 x 5   Entered and Authorized by:   Rubye Oaks NP   Signed by:   Rubye Oaks NP on 09/22/2009   Method used:   [facility]        [facility]  [address] [phone]* (retail)       [address]       [place], [place]  [id number]       Ph: [phone] or [phone]       Fax: [phone]   RxID:   [id number]   Appended Document: Simvastatin changed to 40mg  -per pharm  recs  drug interaction form sent to be scanned in EMR.  pt is aware of the change in dosage.  pt verbalized her understanding.

## 2010-03-28 NOTE — Assessment & Plan Note (Signed)
Summary: rov ///kp   CC:  15 month ROV & review of mult medical problems... .  History of Present Illness: 77 y/o BF here for a follow up visit... she reports a good 6 months and is without new complaints or concerns today... she still works at [employer]	[place] 3 d per week... her granddau is a Optometrist in [employer] Environmental manager residency)...   ~  May 16, 2009:  I last saw Jozey 12/09- notes reviewed... she is due for f/u of her MGUS... she hasn't been able to lose weight-  BP controlled;  BS under control w/ A1c's in the low 6's;  Chol fair on the Simva80...  we discussed diet + exercise & weight reduction strategies...    Current Problem List:  Hx of BRONCHITIS, RECURRENT (ICD-491.9)  HYPERTENSION (ICD-401.9) - on LABETOLOL 200mg Bid, CARDIZEM CD 240mg /d, HYZAAR 100-25 daily... BP today = 120/60 and tolerates meds well... denies HA, fatigue, visual changes, CP, palipit, dizziness, syncope, dyspnea, edema, etc... she needs to do a better job restricting sodium & losing weight!  DIABETES MELLITUS (ICD-250.00) - on METFORMIN 500mg Bid, ACTOS 15mg /d... she has a difficult time w/ diet & exercise esp due to her knee arthritis & we discussed swimming etc...Marland KitchenMarland Kitchen. BS at home betw 110-140 she says...  ~  labs 6/08 showed BS= 124, HgA1c= 6.3  ~  labs 6/09 showed BS= 124, HgA1c= 6.4.Marland KitchenMarland Kitchen rec- same meds, better diet!  ~  labs 12/09 showed BS= 126, A1c= 6.1  ~  labs 10/10 showed BS= 122, A1c= 6.1  ~  labs 3/11 showed BS= 120, A1c= 6.3  HYPERCHOLESTEROLEMIA (ICD-272.0) - on VYTORIN 10-80 now... (12/09- change to Simva80 $$).  ~  FLP 6/08 showed TChol 141, TG 80, HDL 66, LDL 59... LFT's were normal.  ~  FLP 6/09 on Vytorin10/40 showed TChol 223, TG 105, HDL 56, LDL 143... rec- incr to 10/80...  ~  FLP 12/09 on Vytorin 10/80 showed TChol 159, TG 62, HDL 89, LDL 58... ch to Simva80 for $$  ~  FLP 10/10 on Simva80 showed TChol 187, TG 78, HDL 64, LDL 107  ~  FLP 3/11 on Simva80 showed TChol   OBESITY  (ICD-278.00) - we discussed diet + exercise required to lose weight...  ~  weight 1/07 = 188#  ~  weight 12/08 = 202#  ~  weight 12/09 = 201#  ~  weight 3/11 = 201#  DIVERTICULOSIS OF COLON (ICD-562.10) - there is a family hx of colon cancer...  ~  colonoscopy 2/03 by DrSam showed divertics only...   ~  f/u colonoscopy 11/10 by DrJacobs showed 1 sm polyp= tubular adenoma, & divertics...  Hx of BEN NEOPLASM PANCREAS EXCEPT ISLETS LANGERHANS (ICD-211.6) - she is s/p resection of a serous cystadenoma of the pancreas at [facility] by DrTyler in 2004... she has a routine f/u appt sched every yr...  DEGENERATIVE JOINT DISEASE (ICD-715.90) - uses ETODOLAC 400mg Bid as needed... she recently saw DrDean w/ shots in her knees...  VITAMIN D DEFICIENCY (ICD-268.9) - Vit D level 6/09 was 20 & pt Rx w/ 50,000 u weekly...  ~  labs 12/09 showed Vit D level = 30... she chose to continue the 50K weekly.  ~  labs 3/11 showed Vit D level = 28... ?taking 50K each week?- continue.  Hx of COMMON MIGRAINE (ICD-346.10) -prev eval by DrAdelman in the past...  ANXIETY DISORDER, GENERALIZED (ICD-300.02)  ANEMIA NOS (ICD-285.9) - on FeSO4 + Vit C daily...  ~  labs 6/09 showed Hg ~ 10,  Hct= 30, MCV= 78, Fe= 70...  ~  labs 12/09 showed Hg= 10.4, MCV= 79...  ~  labs 3/11 showed Hg= not done,  MONOCLONAL GAMMOPATHY (ICD-273.1) - MGUS diagnosed w/ prev anemia work up... she has a monoclonal IgG Kappa paraprotein identified... we have been following SPE/ IEP yearly without any progression...   ~  labs 6/08 showed monoclonal IgG kappa protein- 0.37g/dl & quant Ig's showed IgG level = 978 mg/dl.  ~  labs 0/45 showed monoclonal IgG kappa protein- 0.41 g/dl & quant Ig's showed IgG level = 1260 mg/dl.  ~  labs 4/09 showed monoclonal IgG kappa protein- 0.37g/dl & quant Ig's showed IgG level = 1190 mg/dl.   Allergies: 1)  Feldene  Comments:  Nurse/Medical Assistant: The patient's medications and allergies were reviewed with  the patient and were updated in the Medication and Allergy Lists.  Past History:  Past Medical History:  BRONCHITIS, RECURRENT (ICD-491.9) HYPERTENSION (ICD-401.9) DIABETES MELLITUS (ICD-250.00) HYPERCHOLESTEROLEMIA (ICD-272.0) OBESITY (ICD-278.00) DIVERTICULOSIS OF COLON (ICD-562.10) Hx of BEN NEOPLASM PANCREAS EXCEPT ISLETS LANGERHANS (ICD-211.6) DEGENERATIVE JOINT DISEASE (ICD-715.90) VITAMIN D DEFICIENCY (ICD-268.9) Hx of COMMON MIGRAINE (ICD-346.10) ANXIETY DISORDER, GENERALIZED (ICD-300.02) ANEMIA NOS (ICD-285.9) MONOCLONAL GAMMOPATHY (ICD-273.1)  Past Surgical History: Hysterectomy Partial Pancreatectomy for Serous Cystademoma 2004 at [facility] History: Reviewed history from 08/11/2007 and no changes required. mother died age 6 from kidney failure father died age 23 from kidney failure no siblings  Social History: Reviewed history from 11/29/2008 and no changes required. 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  The patient denies anorexia, fever, weight loss, weight gain, vision loss, decreased hearing, hoarseness, chest pain, syncope, peripheral edema, prolonged cough, headaches, hemoptysis, abdominal pain, melena, hematochezia, severe indigestion/heartburn, hematuria, incontinence, muscle weakness, suspicious skin lesions, transient blindness, difficulty walking, depression, unusual weight change, abnormal bleeding, enlarged lymph nodes, and angioedema.    Vital Signs:  Patient profile:   77 year old female Height:      64 inches Weight:      201.25 pounds O2 Sat:      100 % on Room air Temp:     98.0 degrees F oral Pulse rate:   73 / minute BP sitting:   120 / 60  (left arm) Cuff size:   regular  Vitals Entered By: Randell Loop CMA (May 16, 2009 11:54 AM)  O2 Sat at Rest %:  100 O2 Flow:  Room  air CC: 15 month ROV & review of mult medical problems...  Is Patient Diabetic? Yes Pain Assessment Patient in pain? no      Comments MEDS UPDATED TODAY   Physical Exam  Additional Exam:  WD, Overweight,  78 y/o BF in NAD... GENERAL:  Alert & oriented; pleasant & cooperative... HEENT:  [place]/AT, EOM-wnl, PERRLA, EACs-clear, TMs-wnl, NOSE-clear, THROAT-clear & wnl. NECK:  Supple w/ fairROM; no JVD; normal carotid impulses w/o bruits; no thyromegaly or nodules palpated; no lymphadenopathy. CHEST:  Clear to P & A; without wheezes/ rales/ or rhonchi. HEART:  Regular Rhythm; without murmurs/ rubs/ or gallops. ABDOMEN:  Soft & nontender; scar of prev surg; normal bowel sounds; no organomegaly or masses detected. EXT: without deformities, mod arthritic changes; no varicose veins/ +venous insuffic/ no edema. NEURO:  CN's intact;  no focal neuro deficits... DERM:  No lesions noted; no rash etc..Marland Kitchen  MISC. Report  Procedure date:  05/17/2009  Findings:      BMP (METABOL)   Sodium                    137 mEq/L                   135-145   Potassium                 4.5 mEq/L                   3.5-5.1   Chloride                  112 mEq/L                   96-112   Carbon Dioxide            19 mEq/L                    19-32   Glucose              [H]  120 mg/dL                   16-10   BUN                       20 mg/dL                    9-60   Creatinine                1.2 mg/dL                   4.5-4.0   Calcium                   9.2 mg/dL                   9.8-11.9   GFR                       56.04 mL/min                >60  Hemoglobin A1C (A1C)   Hemoglobin A1C            6.3 %                       4.6-6.5  Vitamin D (25-Hydroxy) (85810)  Vitamin D (25-Hydroxy)                        [L]  28 ng/mL                    30-89   Impression & Recommendations:  Problem # 1:  HYPERTENSION (ICD-401.9) Controlled-  same meds, get weight down... Her updated medication list for this  problem includes:    Labetalol Hcl 200 Mg Tabs (Labetalol hcl) .Marland Kitchen... Take 1 tablet by mouth two times a day    Hyzaar 100-25 Mg Tabs (Losartan potassium-hctz) .Marland Kitchen... Take 1 tablet by mouth once a day    Cardizem Cd 240 Mg Cp24 (Diltiazem hcl coated beads) .Marland Kitchen... Take 1 tablet by mouth once a day  Orders: TLB-BMP (Basic Metabolic Panel-BMET) (80048-METABOL) TLB-A1C / Hgb A1C (Glycohemoglobin) (83036-A1C) T-Vitamin D (25-Hydroxy) (14782-95621) T-SPE w/reflex to IFE (30865-78469) T- * Misc. Laboratory  test [phone])  Problem # 2:  HYPERCHOLESTEROLEMIA (ICD-272.0) OK on the Simva80... needs better diet. Her updated medication list for this problem includes:    Simvastatin 80 Mg Tabs (Simvastatin) .Marland Kitchen... Take 1 tab by mouth at bedtime...  Problem # 3:  DIABETES MELLITUS (ICD-250.00) Continue current meds... weight reduction is key... Her updated medication list for this problem includes:    Adult Aspirin Low Strength 81 Mg Tbdp (Aspirin) .Marland Kitchen... 1 tab daily    Hyzaar 100-25 Mg Tabs (Losartan potassium-hctz) .Marland Kitchen... Take 1 tablet by mouth once a day    Metformin Hcl 500 Mg Tabs (Metformin hcl) .Marland Kitchen... 1 tab by mouth twice daily    Actos 15 Mg Tabs (Pioglitazone hcl) .Marland Kitchen... 1 tab daily  Problem # 4:  DIVERTICULOSIS OF COLON (ICD-562.10) Divertics and adenomatous colon polyp... plans per DrJacobs.  Problem # 5:  DEGENERATIVE JOINT DISEASE (ICD-715.90) She has mod severe DJD...  Her updated medication list for this problem includes:    Adult Aspirin Low Strength 81 Mg Tbdp (Aspirin) .Marland Kitchen... 1 tab daily    Etodolac 400 Mg Tabs (Etodolac) .Marland Kitchen... Take 1 tablet two times a day as needed arthritis pain.  Problem # 6:  MONOCLONAL GAMMOPATHY (ICD-273.1) She has a mod anemia & known MGUS... SPE/ IEP/ Quant Ig's pending>> stable, continue observation.  Orders: TLB-BMP (Basic Metabolic Panel-BMET) (80048-METABOL) TLB-A1C / Hgb A1C (Glycohemoglobin) (83036-A1C) T-Vitamin D (25-Hydroxy) (60454-09811) T-SPE  w/reflex to IFE (91478-29562) T- * Misc. Laboratory test [phone])  Problem # 7:  OTHER MEDICAL PROBLEMS AS NOTED>>>  Complete Medication List: 1)  Adult Aspirin Low Strength 81 Mg Tbdp (Aspirin) .Marland Kitchen.. 1 tab daily 2)  Labetalol Hcl 200 Mg Tabs (Labetalol hcl) .... Take 1 tablet by mouth two times a day 3)  Hyzaar 100-25 Mg Tabs (Losartan potassium-hctz) .... Take 1 tablet by mouth once a day 4)  Cardizem Cd 240 Mg Cp24 (Diltiazem hcl coated beads) .... Take 1 tablet by mouth once a day 5)  Metformin Hcl 500 Mg Tabs (Metformin hcl) .Marland Kitchen.. 1 tab by mouth twice daily 6)  Actos 15 Mg Tabs (Pioglitazone hcl) .Marland Kitchen.. 1 tab daily 7)  Simvastatin 80 Mg Tabs (Simvastatin) .... Take 1 tab by mouth at bedtime.Marland KitchenMarland Kitchen 8)  Etodolac 400 Mg Tabs (Etodolac) .... Take 1 tablet two times a day as needed arthritis pain. 9)  Vitamin D 57846 Unit Caps (Ergocalciferol) .... Take one tablet by mouth once a week 10)  Ferrous Sulfate 324 Mg Tbec (Ferrous sulfate) .... Take 1 tab by mouth once daily w/ a 500mg  vit c tab... 11)  Allergy Vaccine  .... Once weekly  Patient Instructions: 1)  Today we updated your med list- see below.... 2)  Continue your current medications the same for now... 3)  Today we did your follow up blood work... please call the "phone tree" in a few days for your lab results.Marland KitchenMarland Kitchen  4)  Gabrelle, let's get on track w/ our diet + exercise program...  the goal is to lose 15-20 lbs... YOU CAN DO IT!!! 5)  Call for any problems.Marland KitchenMarland Kitchen 6)  Please schedule a follow-up appointment in 6 months.

## 2010-05-31 LAB — GLUCOSE, CAPILLARY: Glucose-Capillary: 119 mg/dL — ABNORMAL HIGH (ref 70–99)

## 2010-07-14 NOTE — Discharge Summary (Signed)
NAME:  Wendy Kerr, Wendy Kerr                        ACCOUNT NO.:  [id number]   MEDICAL RECORD NO.:  [id number]                   PATIENT TYPE:  INP   LOCATION:  0371                                 FACILITY:  [facility]   PHYSICIAN:  Lonzo Cloud. Kriste Basque, M.D. [facility]            DATE OF BIRTH:  25-Jan-1931   DATE OF ADMISSION:  11/10/2001  DATE OF DISCHARGE:  11/15/2001                                 DISCHARGE SUMMARY   FINAL DIAGNOSES:  1. Admitted on 11/11/01, with abdominal distention, pain, and tenderness with     partial small bowel obstruction likely due to pelvic adhesions.  2. Incidentally noted 3.5 cm pancreatic mass in body of the pancreas without     prodromal symptoms;  normal CEA and CA-19-9 levels;  needle biopsy     consistent with a microcystic cyst adenoma of the pancreas.  The patient     will have outpatient surgical consultation.  3. Diabetes mellitus, controlled on oral agents.  4. Hypertension, controlled on medications.  5. Hypercholesterolemia, controlled on Lipitor.  6. History of asthmatic bronchitis.  7. Status post hysterectomy in 1972.  8. History of anxiety.  9. History of diverticulosis with last colonoscopy in 2/03, Dr. Victorino Dike.  10.      History of mild anemia with slightly low iron saturations last     year.  11.      History of migraine headaches with headache evaluation by Dr.     Meryl Crutch in 1991.   HISTORY OF PRESENT ILLNESS:  The patient is a 77 year old lady known to me  with multiple medical problems as noted above.  She is the mother of Oakleigh Hesketh with [employer].  She has a history of diabetes,  high blood pressure, and hypercholesterolemia, all controlled on medications  and followed regularly.  She states that she was in her usual state of  health until 9 p.m. on the night prior to admission when she developed the  onset of mid abdominal pain with some swelling and slight tenderness.  She  denied nausea or vomiting.  She denied  change in bowel movements, such as  diarrhea, constipation, or blood.  She denied fevers, chills, sweats, or  other systemic manifestations.  As the pain did not subside, she came to the  emergency room where she was evaluated by Dr. Ethelda Chick of the ER staff,  and diagnosed with a partial small bowel obstruction likely due to pelvic  adhesions (the patient had a previous hysterectomy in 1972).  Incidentally  noted, however, was a 3.5 cm pancreatic mass in the body of the pancreas  that was totally unsuspected.  As noted, the patient did not complain of any  previous abdominal pain prior to the evening before admission, nor did she  have any history of nausea, vomiting, etc.  She denied any change in  appetite, weight loss, or constitutional symptoms.   PAST MEDICAL  HISTORY:  1. History of diabetes mellitus, controlled on Glucophage and Avandia.  Her     blood sugar was 117, and hemoglobin A1C was 7.1 this summer.  Previously,     her hemoglobin A1C was in the 6.7 range.  She is a little overweight, and     has been trying desperately to lose weight on diet.  2. History of hypertension, controlled on Cardizem, Hyzaar, and labetalol.  3. History of hypercholesterolemia for which she takes Lipitor.  Her last     cholesterol was 206, HDL of 60, LDL of 124, triglycerides of 113.  4. History of asthmatic bronchitis in the past.  5. History of diverticulosis, and had a colonoscopy by Dr. Victorino Dike in     2/03, with no polyps in her past.  She had a previous colonoscopy in 1996     that was negative.  She has not had any upper GI problems.  6. History of mild anemia with borderline low iron of 55, total iron binding     capacity 464, and a saturation of 12% in 1/02.  7. History of migraines with a negative headache evaluation from Dr. Meryl Crutch     in 1991.   PHYSICAL EXAMINATION:  GENERAL:  A healthy-appearing 77 year old lady in no  acute distress.  VITAL SIGNS:  Blood pressure 170/80,  pulse 92 per minute and regular,  respirations 22 per minute and not labored, temperature 98.6.  HEENT:  Unremarkable.  NECK:  No jugular venous distention, no carotid bruits, no thyromegaly or  lymphadenopathy.  CHEST:  Clear to auscultation and percussion.  CARDIAC:  A regular rhythm, a grade 1/6 systolic ejection murmur at the left  sternal border, no rubs or gallops heard.  ABDOMEN:  Somewhat distended with positive bowel sounds that were slightly  increased.  There was mild tenderness on palpation through the mid abdomen.  There was some slight guarding, but no rebound in the lower quadrants.  RECTAL:  Negative.  Stool is Hematest negative.  EXTREMITIES:  No cyanosis, clubbing, or edema.  NEUROLOGIC:  Intact without focal abnormalities detected.   LABORATORY DATA:  EKG showed normal sinus rhythm, nonspecific ST-T wave  changes, no acute abnormalities.  Chest x-ray showed borderline cardiomegaly  and some mild atelectasis of the right base.  Abdominal films showed NG tube  in the stomach, dilated small bowel loops.  Serial films showed improvement.  CT scan of the abdomen showed a large low attenuation mass in the body of  pancreas measuring 2.6 x 3.1 cm, likely representing a neoplasm.  No  evidence of adenopathy or regional metastases.  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  Dilated proximal small bowel loops extending into the pelvis, no other  adenopathy, free fluid, or inflammatory process, etc.  Pelvic examination  revealed a previous hysterectomy, dilated small bowel loops representing a  partial small bowel obstruction.  Subsequent CT guided biopsy of the  pancreatic lesion with pathology report indicating a probable microcystic  cystadenoma of the pancreas.  Hemoglobin 10.7, hematocrit 31.9, white count 8500, 68% segs.  Followup CBC with a hemoglobin of 11.0.  Sedimentation rate  30.  Prothrombin time 11.1, INR 0.7,  PTT 28 seconds.  Sodium 134, potassium  3.7, chloride 101, CO2 26, BUN 27, creatinine 1.0, blood sugar 154, calcium  9.6.  Total protein 7.6, albumin 4.0, AST 21, ALT 17, alkaline phosphatase  64, total bilirubin 0.5.  Hemoglobin A1C 6.1  TSH 5.06.  CEA 1.1, CA-19-9  less than 3.  Urinalysis clear.   HOSPITAL COURSE:  The patient was admitted with partial small bowel  obstruction that appeared to be due to pelvic adhesions, likely from her  previous hysterectomy, and an incidentally noted 3.5 cm pancreatic mass in  the body of the pancreas.  An NG tube was placed and placed on low  intermittent suction.  She was given IV fluids and morphine for pain.  She  was seen in consultation by Dr. Marina Goodell of the gastroenterology service who  felt that we should treat her small bowel obstruction conservatively with NG  suction, and clear liquids to start gradually increasing her diet as  tolerated.  Then when the distention had resolved, pursue biopsy of the  pancreatic mass.  Accu-Checks were monitored q.i.d., and she was given  sliding scale insulin coverage.  She was also covered with Lovenox  subcutaneously and Protonix IV.  With this conservative management, her  small bowel obstruction gradually improved, and she started passing some  gas.  At that point, her diet was advanced to full liquids, and arrangements  made for needle biopsy of the pancreatic lesion.  This was done by Dr.  Fredia Sorrow of the radiology staff.  No tumor cells were seen on the quick dip  specimens, and additional material was requested and sent for analysis.  I  talked with Dr. Berneta Levins of the pathology department, who favored the  diagnosis of a microcystic cyst adenoma of the pancreas.  There did not  appear to be any mucin production.  The patient was discharged over the  weekend pending pathology review with plans for outpatient surgical  evaluation once the final pathology was revealed.    DISCHARGE MEDICATIONS:   1. Regular home medical regimen, including Glucophage 1000 mg 1/2 tablet     p.o. q.a.m., Avandia 8 mg tablets 1/2 tablet p.o. q.p.m., Cardizem CD 240     mg p.o. q.d., Hyzaar 100 mg p.o. q.d.,  labetalol 200 mg p.o. b.i.d.,     Lipitor 10 mg p.o. q.d.  2. Phenergan p.r.n. nausea.   CONDITION ON DISCHARGE:  Improved.   DISPOSITION:  The patient is being discharged pending pathology report.  The  patient's surgical referral versus oncology evaluation once the results are  available.   As noted above, the pathology report returned with a diagnosis favoring a  microcystic cyst adenoma, and surgical referral is being made with Dr. Dominga Ferry.                                               Lonzo Cloud. Kriste Basque, M.D. [facility]    SMN/MEDQ  D:  11/20/2001  T:  11/20/2001  Job:  [id number]   cc:   Luisa Hart L. Lurene Shadow, M.D.  [address]  [place]  [place] [address] Fax: [phone]

## 2010-07-14 NOTE — Assessment & Plan Note (Signed)
[facility]                             PULMONARY OFFICE NOTE   NAME:JOHNSONNoriah, Osgood                     MRN:          [id number]  DATE:01/24/2006                            DOB:          Apr 25, 1930    HISTORY OF PRESENT ILLNESS:  The patient is an African-American female  patient of Dr. Jodelle Green who has a known history of diabetes mellitus,  hypertension, and hyperlipidemia who presents for an acute office visit.  The patient complains of a one week history of urinary frequency and  urgency x1 week. The patient denies any hematuria, abdominal pain,  nausea, vomiting or back pain. Her last urinary tract infection was 2  years ago. Blood sugars at home have been 120. Last hemoglobin A1c was  5.9.   PAST MEDICAL HISTORY:  Reviewed.   CURRENT MEDICATIONS:  Reviewed.   PHYSICAL EXAMINATION:  GENERAL:  The patient is a pleasant female in no  acute distress.  VITAL SIGNS:  She is afebrile with stable vital signs.  HEENT:  Unremarkable.  NECK:  Supple without adenopathy. No JVD.  LUNGS:  Lung sounds are clear.  CARDIAC:  Regular rate.  ABDOMEN:  Soft and benign.  EXTREMITIES:  Warm without any calf tenderness, cyanosis, clubbing or  edema. Negative CVA tenderness.   IMPRESSION/PLAN:  I suspect acute cystitis. Urinalysis is pending. The  patient will Cipro 250 mg b.i.d. x3 days. Increase fluid intake. Advised  on urinary hygiene measures. The patient will return back with Dr. Kriste Basque  as scheduled or sooner if needed.      Rubye Oaks, NP  Electronically Signed      Lonzo Cloud. Kriste Basque, MD  Electronically Signed   TP/MedQ  DD: 01/24/2006  DT: 01/25/2006  Job #: [id number]

## 2010-07-20 ENCOUNTER — Encounter: Payer: Self-pay | Admitting: Pulmonary Disease

## 2010-07-31 ENCOUNTER — Ambulatory Visit (INDEPENDENT_AMBULATORY_CARE_PROVIDER_SITE_OTHER): Payer: Medicare Other | Admitting: Pulmonary Disease

## 2010-07-31 ENCOUNTER — Other Ambulatory Visit (INDEPENDENT_AMBULATORY_CARE_PROVIDER_SITE_OTHER): Payer: Medicare Other

## 2010-07-31 ENCOUNTER — Encounter: Payer: Self-pay | Admitting: Pulmonary Disease

## 2010-07-31 DIAGNOSIS — E119 Type 2 diabetes mellitus without complications: Secondary | ICD-10-CM

## 2010-07-31 DIAGNOSIS — I10 Essential (primary) hypertension: Secondary | ICD-10-CM

## 2010-07-31 DIAGNOSIS — E669 Obesity, unspecified: Secondary | ICD-10-CM

## 2010-07-31 DIAGNOSIS — D649 Anemia, unspecified: Secondary | ICD-10-CM

## 2010-07-31 DIAGNOSIS — E7800 Pure hypercholesterolemia, unspecified: Secondary | ICD-10-CM

## 2010-07-31 DIAGNOSIS — E559 Vitamin D deficiency, unspecified: Secondary | ICD-10-CM

## 2010-07-31 DIAGNOSIS — D472 Monoclonal gammopathy: Secondary | ICD-10-CM

## 2010-07-31 DIAGNOSIS — F411 Generalized anxiety disorder: Secondary | ICD-10-CM

## 2010-07-31 DIAGNOSIS — D136 Benign neoplasm of pancreas: Secondary | ICD-10-CM

## 2010-07-31 DIAGNOSIS — M1990 Unspecified osteoarthritis, unspecified site: Secondary | ICD-10-CM

## 2010-07-31 DIAGNOSIS — K5730 Diverticulosis of large intestine without perforation or abscess without bleeding: Secondary | ICD-10-CM

## 2010-07-31 LAB — HEPATIC FUNCTION PANEL
ALT: 16 U/L (ref 0–35)
AST: 16 U/L (ref 0–37)
Albumin: 3.9 g/dL (ref 3.5–5.2)
Total Bilirubin: 0.3 mg/dL (ref 0.3–1.2)

## 2010-07-31 LAB — CBC WITH DIFFERENTIAL/PLATELET
Basophils Relative: 0.7 % (ref 0.0–3.0)
Eosinophils Relative: 2.7 % (ref 0.0–5.0)
MCV: 80.8 fl (ref 78.0–100.0)
Monocytes Absolute: 0.5 10*3/uL (ref 0.1–1.0)
Monocytes Relative: 8.2 % (ref 3.0–12.0)
Neutrophils Relative %: 64.2 % (ref 43.0–77.0)
Platelets: 383 10*3/uL (ref 150.0–400.0)
RBC: 3.68 Mil/uL — ABNORMAL LOW (ref 3.87–5.11)
WBC: 6 10*3/uL (ref 4.5–10.5)

## 2010-07-31 LAB — LIPID PANEL
Cholesterol: 170 mg/dL (ref 0–200)
LDL Cholesterol: 85 mg/dL (ref 0–99)
Triglycerides: 86 mg/dL (ref 0.0–149.0)
VLDL: 17.2 mg/dL (ref 0.0–40.0)

## 2010-07-31 LAB — VITAMIN D 25 HYDROXY (VIT D DEFICIENCY, FRACTURES): Vit D, 25-Hydroxy: 40 ng/mL (ref 30–89)

## 2010-07-31 LAB — BASIC METABOLIC PANEL
BUN: 32 mg/dL — ABNORMAL HIGH (ref 6–23)
Chloride: 109 mEq/L (ref 96–112)
Creatinine, Ser: 1.1 mg/dL (ref 0.4–1.2)
GFR: 64.47 mL/min (ref >60.00)
Glucose, Bld: 108 mg/dL — ABNORMAL HIGH (ref 70–99)

## 2010-07-31 LAB — TSH: TSH: 1.35 u[IU]/mL (ref 0.35–5.50)

## 2010-07-31 NOTE — Progress Notes (Signed)
Subjective:    Patient ID: Wendy Kerr, female    DOB: November 06, 1930, 77 y.o.   MRN: [id number]  HPI 77 y/o BF here for a follow up visit... she has multiple medical problems including HBP;  Hyperchol & DM;  Obesity;  Divertics;  DJD;  & Anemia w/ MGUS...  she still works at [employer]	[place] 3 d per week... her granddau is a Optometrist in [employer] manager residency).  ~  May 16, 2009:  I last saw Taraya 12/09- notes reviewed... she is due for f/u of her MGUS... she hasn't been able to lose weight-  BP controlled;  BS under control w/ A1c's in the low 6's;  Chol fair on the Simva80...  we discussed diet + exercise & weight reduction strategies...  ~  January 23, 2010:  despite all her best efforts she has gained 7# up to 207# today... BP remains under control;  FLP looks reasonably well controlled on Simva40;  BS & A1c continue to look good on Metformin & Actos (I told her she could save $$ & stop the actos if she got her wt down)...  ~  June 4,2012:  36mo ROV & she reports stable> still working part time at [employer] thinking again about retirement;  BP stable on Labetolol, Cardizem, & Hyzaar;  Chol controlled on Simva40;  DM controlled on Metformin & Actos;  Weight down 5# to 202# today;  Stable anemia w/ Hg=10 & MMpanel stable MGUS w/o progression...  rec to continue same meds, get on diet, get wt down...   Problem List:  ALLERGIC RHINITIS>  She is still on weekly allergy shots per DrESL...  Hx of BRONCHITIS, RECURRENT (ICD-491.9) - no recent cough, sputum, dyspnea, etc...  HYPERTENSION (ICD-401.9) - on ASA 81mg /d, LABETOLOL 200mg Bid, CARDIZEM CD 240mg /d, HYZAAR 100-25 daily... BP today = 142/84 and tolerates meds well... denies HA, fatigue, visual changes, CP, palipit, dizziness, syncope, dyspnea, edema, etc... she needs to do a better job restricting sodium & losing weight!  DIABETES MELLITUS (ICD-250.00) - on METFORMIN 500mg Bid, ACTOS 15mg /d... she has a difficult time w/ diet &  exercise esp due to her knee arthritis & we discussed swimming etc...Marland KitchenMarland Kitchen. BS at home betw 110-140 she says... ~  labs 6/08 showed BS= 124, HgA1c= 6.3 ~  labs 6/09 showed BS= 124, HgA1c= 6.4.Marland KitchenMarland Kitchen rec- same meds, better diet! ~  labs 12/09 showed BS= 126, A1c= 6.1 ~  labs 10/10 showed BS= 122, A1c= 6.1 ~  labs 3/11 showed BS= 120, A1c= 6.3 ~  labs 12/11 showed BS= 103, A1c= 6.1 ~  Labs 6/12 showed BS= 108, A1c not done...  HYPERCHOLESTEROLEMIA (ICD-272.0) - now on SIMVASTATIN 40mg /d (prev Vytorin, then Simva80). ~  FLP 6/08 showed TChol 141, TG 80, HDL 66, LDL 59... LFT's were normal. ~  FLP 6/09 on Vytorin10/40 showed TChol 223, TG 105, HDL 56, LDL 143... rec- incr to 10/80... ~  FLP 12/09 on Vytorin 10/80 showed TChol 159, TG 62, HDL 89, LDL 58... ch to Simva80 for $$ ~  FLP 10/10 on Simva80 showed TChol 187, TG 78, HDL 64, LDL 107 ~  Simvastatin was decreased to 40mg /d based on the new guidelines... ~  FLP 12/11 on Simva40 showed TChol 170, TG 62, HDL 68, LDL 90 ~  FLP on Simva40 showed TChol 170, TG 86, HDL 68, LDL 85  OBESITY (ICD-278.00) - we discussed diet + exercise required to lose weight... ~  weight 1/07 = 188# ~  weight 12/08 = 202# ~  weight 12/09 = 201# ~  weight 3/11 = 201# ~  weight 11/11 = 207# ~  Weight 6/12 = 202#  DIVERTICULOSIS OF COLON (ICD-562.10) - there is a family hx of colon cancer... ~  colonoscopy 2/03 by DrSam showed divertics only...  ~  f/u colonoscopy 11/10 by DrJacobs showed 1 sm polyp= tubular adenoma, & divertics...  Hx of BEN NEOPLASM PANCREAS EXCEPT ISLETS LANGERHANS (ICD-211.6) - she is s/p resection of a serous cystadenoma of the pancreas at [facility] by DrTyler in 2004... she has a routine f/u appt sched yearly> now every other yr.  DEGENERATIVE JOINT DISEASE (ICD-715.90) - uses ETODOLAC 400mg Bid as needed... she recently saw DrDean w/ shots in her knees... ~  11/11:  she reports early CTS per DrDean & Rx w/ wrist splints & improved...  VITAMIN D  DEFICIENCY (ICD-268.9) - Vit D level 6/09 was 20 & pt Rx w/ 50,000 u weekly... ~  labs 12/09 showed Vit D level = 30... she chose to continue the 50K weekly. ~  labs 3/11 showed Vit D level = 28... ?taking 50K each week?- continue. ~  Labs 6/12 showed Vit D = 40... rec to continue 50K ewach week...  Hx of COMMON MIGRAINE (ICD-346.10) -prev eval by DrAdelman in the past...  ANXIETY DISORDER, GENERALIZED (ICD-300.02)  ANEMIA NOS (ICD-285.9) - on FeSO4 + Vit C daily... ~  labs 6/09 showed Hg~ 10, Hct= 30, MCV= 78, Fe= 70... ~  labs 12/09 showed Hg= 10.4, MCV= 79... ~  labs 3/11 showed Hg= not done... ~  labs 11/11 showed Hg= 9.5, MVC= 80, Fe= 61 (12%sat) ~  Labs 6/12 showed Hg= 10.0, MCV= 81  MONOCLONAL GAMMOPATHY (ICD-273.1) - MGUS diagnosed w/ prev anemia work up... she has a monoclonal IgG Kappa paraprotein identified... we have been following SPE/ IEP yearly without any progression...  ~  labs 6/08 showed monoclonal IgG kappa protein- 0.37g/dl & quant Ig's showed IgG level = 978 mg/dl. ~  labs 0/45 showed monoclonal IgG kappa protein- 0.41 g/dl & quant Ig's showed IgG level = 1260 mg/dl. ~  labs 4/09 showed monoclonal IgG kappa protein- 0.37g/dl & quant Ig's showed IgG level = 1190 mg/dl. ~  Labs 8/11 showed monoclonal IgG kappa protein- 0.41g/dl & quant Ig's showed IgG level = 1190 mg/dl   No past surgical history on file.   Outpatient Encounter Prescriptions as of 07/31/2010  Medication Sig Dispense Refill  . aspirin 81 MG tablet Take 81 mg by mouth daily.        Marland Kitchen diltiazem (CARDIZEM CD) 240 MG 24 hr capsule Take 240 mg by mouth daily.        . ergocalciferol (VITAMIN D2) 50000 UNITS capsule Take 50,000 Units by mouth once a week.        . etodolac (LODINE) 400 MG tablet Take 400 mg by mouth daily. As needed for arthritis pain       . ferrous fumarate (HEMOCYTE - 106 MG FE) 325 (106 FE) MG TABS Take 325 mg by mouth daily. Along with vitamin c 500mg  daily       . labetalol  (NORMODYNE) 200 MG tablet Take 200 mg by mouth 2 (two) times daily.        Marland Kitchen losartan-hydrochlorothiazide (HYZAAR) 100-25 MG per tablet Take 1 tablet by mouth daily.        . metFORMIN (GLUCOPHAGE) 500 MG tablet Take 500 mg by mouth 2 (two) times daily with a meal.        .  pioglitazone (ACTOS) 15 MG tablet Take 15 mg by mouth daily.        . simvastatin (ZOCOR) 40 MG tablet Take 40 mg by mouth at bedtime.          Allergies  Allergen Reactions  . Piroxicam     REACTION: HEADACHE    Review of Systems        See HPI - all other systems neg except as noted... The patient complains of dyspnea on exertion.  The patient denies anorexia, fever, weight loss, weight gain, vision loss, decreased hearing, hoarseness, chest pain, syncope, peripheral edema, prolonged cough, headaches, hemoptysis, abdominal pain, melena, hematochezia, severe indigestion/heartburn, hematuria, incontinence, muscle weakness, suspicious skin lesions, transient blindness, difficulty walking, depression, unusual weight change, abnormal bleeding, enlarged lymph nodes, and angioedema.    Objective:   Physical Exam     WD, Overweight,  77 y/o BF in NAD... GENERAL:  Alert & oriented; pleasant & cooperative... HEENT:  [place]/AT, EOM-wnl, PERRLA, EACs-clear, TMs-wnl, NOSE-clear, THROAT-clear & wnl. NECK:  Supple w/ fairROM; no JVD; normal carotid impulses w/o bruits; no thyromegaly or nodules palpated; no lymphadenopathy. CHEST:  Clear to P & A; without wheezes/ rales/ or rhonchi. HEART:  Regular Rhythm; without murmurs/ rubs/ or gallops. ABDOMEN:  Soft & nontender; scar of prev surg; normal bowel sounds; no organomegaly or masses detected. EXT: without deformities, mod arthritic changes; no varicose veins/ +venous insuffic/ no edema. NEURO:  CN's intact;  no focal neuro deficits... DERM:  No lesions noted; no rash etc...   Assessment & Plan:   AR/ Bronchitis>  Stable & breathing well; she clearly needs to incr her activity  level...  HBP>  Controlled on meds; continue same; get on diet & get wt down!  DM>  Stable on Metform + Actos; but really needs better diet, get wt down...  CHOL>  Stable on Simva40; same thing applies top diet efforts!  OBESITY>  We again reviewed diet + exercise prescriptions...  GI> Divertics/ Polyps>  Followed by DrJacobs and up to date...  Hx Pancreatic serous cystadenoma removed at [facility] 2004> stable & doing well...  DJD>  On Etodolac & followed by DrDean...  Vit D defic>  Remains on Vit D 50K weekly...  Anemia & MGUS>  F/u labs are stable, no progression of the MGUS.

## 2010-07-31 NOTE — Patient Instructions (Signed)
Today we updated your med list in EPIC...    Continue your current meds the same...  Today we did your follow up fasting blood work...    Please call the PHONE TREE in a few days for your results...    Dial [id number] & when prompted enter your patient number followed by the # symbol...    Your patient number is:  [id number]#  Keep up the good work w/ your diet program, and increase your exercise to help w/ wt reduction...  Call for ant problems...  Let's plan another routine follow up in 6 months, sooner if necessary for problems.Marland KitchenMarland Kitchen

## 2010-08-02 LAB — MULTIPLE MYELOMA PANEL, SERUM
Albumin ELP: 55 % — ABNORMAL LOW (ref 55.8–66.1)
Alpha-1-Globulin: 4.3 % (ref 2.9–4.9)
Alpha-2-Globulin: 14 % — ABNORMAL HIGH (ref 7.1–11.8)
Gamma Globulin: 15.1 % (ref 11.1–18.8)
IgA: 200 mg/dL (ref 69–380)
IgG (Immunoglobin G), Serum: 1190 mg/dL (ref 690–1700)
IgM, Serum: 52 mg/dL (ref 52–322)
Total Protein: 7.4 g/dL (ref 6.0–8.3)

## 2010-08-11 ENCOUNTER — Encounter: Payer: Self-pay | Admitting: Pulmonary Disease

## 2010-09-26 ENCOUNTER — Other Ambulatory Visit: Payer: Self-pay | Admitting: Adult Health

## 2010-10-02 NOTE — Telephone Encounter (Signed)
Electronic refill request from Actos 15mg  - per epic was refilled on 7.31.12.

## 2010-10-22 ENCOUNTER — Other Ambulatory Visit: Payer: Self-pay | Admitting: Adult Health

## 2010-10-24 ENCOUNTER — Other Ambulatory Visit: Payer: Self-pay | Admitting: Adult Health

## 2011-01-29 ENCOUNTER — Ambulatory Visit (INDEPENDENT_AMBULATORY_CARE_PROVIDER_SITE_OTHER): Payer: Medicare Other | Admitting: Pulmonary Disease

## 2011-01-29 ENCOUNTER — Ambulatory Visit (INDEPENDENT_AMBULATORY_CARE_PROVIDER_SITE_OTHER)
Admission: RE | Admit: 2011-01-29 | Discharge: 2011-01-29 | Disposition: A | Discharge status: Home / Self Care | Payer: Medicare Other | Source: Ambulatory Visit | Attending: Pulmonary Disease | Admitting: Pulmonary Disease

## 2011-01-29 ENCOUNTER — Other Ambulatory Visit (INDEPENDENT_AMBULATORY_CARE_PROVIDER_SITE_OTHER): Payer: Medicare Other

## 2011-01-29 ENCOUNTER — Other Ambulatory Visit: Payer: Self-pay | Admitting: Pulmonary Disease

## 2011-01-29 ENCOUNTER — Encounter: Payer: Self-pay | Admitting: Pulmonary Disease

## 2011-01-29 VITALS — BP 140/82 | HR 95 | Temp 99.1°F | Ht 64.0 in | Wt 204.6 lb

## 2011-01-29 DIAGNOSIS — D136 Benign neoplasm of pancreas: Secondary | ICD-10-CM

## 2011-01-29 DIAGNOSIS — E7800 Pure hypercholesterolemia, unspecified: Secondary | ICD-10-CM

## 2011-01-29 DIAGNOSIS — I10 Essential (primary) hypertension: Secondary | ICD-10-CM

## 2011-01-29 DIAGNOSIS — E119 Type 2 diabetes mellitus without complications: Secondary | ICD-10-CM

## 2011-01-29 DIAGNOSIS — M1990 Unspecified osteoarthritis, unspecified site: Secondary | ICD-10-CM

## 2011-01-29 DIAGNOSIS — D472 Monoclonal gammopathy: Secondary | ICD-10-CM

## 2011-01-29 DIAGNOSIS — D649 Anemia, unspecified: Secondary | ICD-10-CM

## 2011-01-29 DIAGNOSIS — E559 Vitamin D deficiency, unspecified: Secondary | ICD-10-CM

## 2011-01-29 DIAGNOSIS — J069 Acute upper respiratory infection, unspecified: Secondary | ICD-10-CM

## 2011-01-29 DIAGNOSIS — E669 Obesity, unspecified: Secondary | ICD-10-CM

## 2011-01-29 DIAGNOSIS — F411 Generalized anxiety disorder: Secondary | ICD-10-CM

## 2011-01-29 DIAGNOSIS — J42 Unspecified chronic bronchitis: Secondary | ICD-10-CM

## 2011-01-29 LAB — CBC WITH DIFFERENTIAL/PLATELET
Basophils Relative: 0.4 % (ref 0.0–3.0)
Eosinophils Relative: 1.1 % (ref 0.0–5.0)
HCT: 29.1 % — ABNORMAL LOW (ref 36.0–46.0)
Lymphs Abs: 0.5 10*3/uL — ABNORMAL LOW (ref 0.7–4.0)
MCV: 80.1 fl (ref 78.0–100.0)
Monocytes Absolute: 0.5 10*3/uL (ref 0.1–1.0)
Neutro Abs: 5 10*3/uL (ref 1.4–7.7)
RBC: 3.63 Mil/uL — ABNORMAL LOW (ref 3.87–5.11)
WBC: 6 10*3/uL (ref 4.5–10.5)

## 2011-01-29 LAB — LIPID PANEL
Cholesterol: 176 mg/dL (ref 0–200)
LDL Cholesterol: 83 mg/dL (ref 0–99)
Total CHOL/HDL Ratio: 2

## 2011-01-29 LAB — BASIC METABOLIC PANEL
Chloride: 107 mEq/L (ref 96–112)
Potassium: 4.8 mEq/L (ref 3.5–5.1)

## 2011-01-29 MED ORDER — HYDROCODONE-HOMATROPINE 5-1.5 MG/5ML PO SYRP: 5.0000 mL | ORAL_SOLUTION | Freq: Four times a day (QID) | ORAL | Status: AC | PRN

## 2011-01-29 MED ORDER — PIOGLITAZONE HCL 15 MG PO TABS: 15.0000 mg | ORAL_TABLET | Freq: Every day | ORAL | Status: DC

## 2011-01-29 MED ORDER — METFORMIN HCL 500 MG PO TABS: 500.0000 mg | ORAL_TABLET | Freq: Two times a day (BID) | ORAL | Status: DC

## 2011-01-29 MED ORDER — LOSARTAN POTASSIUM-HCTZ 100-25 MG PO TABS: 1.0000 | ORAL_TABLET | Freq: Every day | ORAL | Status: DC

## 2011-01-29 MED ORDER — ERGOCALCIFEROL 1.25 MG (50000 UT) PO CAPS: 50000.0000 [IU] | ORAL_CAPSULE | ORAL | Status: DC

## 2011-01-29 MED ORDER — DILTIAZEM HCL ER COATED BEADS 240 MG PO CP24: 240.0000 mg | ORAL_CAPSULE | Freq: Every day | ORAL | Status: DC

## 2011-01-29 MED ORDER — SIMVASTATIN 40 MG PO TABS: 40.0000 mg | ORAL_TABLET | Freq: Every day | ORAL | Status: DC

## 2011-01-29 MED ORDER — AZITHROMYCIN 250 MG PO TABS: ORAL_TABLET | ORAL | Status: AC

## 2011-01-29 MED ORDER — LABETALOL HCL 200 MG PO TABS: 200.0000 mg | ORAL_TABLET | Freq: Two times a day (BID) | ORAL | Status: DC

## 2011-01-29 MED ORDER — ETODOLAC 400 MG PO TABS: 400.0000 mg | ORAL_TABLET | Freq: Every day | ORAL | Status: DC

## 2011-01-29 NOTE — Progress Notes (Signed)
Subjective:    Patient ID: Wendy Kerr, female    DOB: February 19, 1931, 77 y.o.   MRN: [id number]  HPI Review of Systems Objective:  Physical Exam Assessment & Plan:     Subjective:    Patient ID: Wendy Kerr, female    DOB: [birth year], 77 y.o.   MRN: [id number]  HPI 77 y/o BF here for a follow up visit... she has multiple medical problems including HBP;  Hyperchol & DM;  Obesity;  Divertics;  DJD;  & Anemia w/ MGUS...  she still works at [employer]	[place] 3 d per week... her granddau is a Optometrist in [employer] residency).  ~  May 16, 2009:  I last saw Wendy Kerr 12/09- notes reviewed... she is due for f/u of her MGUS... she hasn't been able to lose weight-  BP controlled;  BS under control w/ A1c's in the low 6's;  Chol fair on the Simva80...  we discussed diet + exercise & weight reduction strategies...  ~  January 23, 2010:  despite all her best efforts she has gained 7# up to 207# today... BP remains under control;  FLP looks reasonably well controlled on Simva40;  BS & A1c continue to look good on Metformin & Actos (I told her she could save $$ & stop the actos if she got her wt down)...  ~  June 4,2012:  22mo ROV & she reports stable> still working part time at [employer] thinking again about retirement;  BP stable on Labetolol, Cardizem, & Hyzaar;  Chol controlled on Simva40;  DM controlled on Metformin & Actos;  Weight down 5# to 202# today;  Stable anemia w/ Hg=10 & MMpanel stable MGUS w/o progression...  rec to continue same meds, get on diet, get wt down...  ~  January 29, 2011:  22mo ROV & she notes a URI/ "head cold" starting yest w/ cough & whitish sput production; denies f/c/s, SOB, CP, etc... We decided to check CXR (neg- no infiltrate) & treat w/ ZPak & Hycodan... otherw doing well> BP remains controlled on her ; FLP looks good on Simva40; DM control excellent on Metform/Actos; unfortunately wt is not coming down & we reviewed diet + [facility]; athritis discomfort controlled w/ Etololac & Tylenol; known mild anemia & MGUS that is stable but Fe is diminished despite Fe+VitC daily (rec to incr to Bid & may need Iron IV if not improving)...          Problem List:  ALLERGIC RHINITIS>  She is still on weekly allergy shots per DrESL...  Hx of BRONCHITIS, RECURRENT (ICD-491.9) ~  12/12:  Presents w/ URI, bronchitic exac treated w/ ZPak & Hycodan...  HYPERTENSION (ICD-401.9) - on ASA 81mg /d, LABETOLOL 200mg Bid, CARDIZEM CD 240mg /d, HYZAAR 100-25 daily... BP today = 140/82 and tolerates meds well... denies HA, fatigue, visual changes, CP, palipit, dizziness, syncope, dyspnea, edema, etc... she needs to do a better job restricting sodium & losing weight!  DIABETES MELLITUS (ICD-250.00) - on METFORMIN 500mg Bid, ACTOS 15mg /d... she has a difficult time w/ diet & exercise esp due to her knee arthritis & we discussed swimming etc...Marland KitchenMarland Kitchen. BS at home betw 110-140 she says... ~  labs 6/08 showed BS= 124, HgA1c= 6.3 ~  labs 6/09 showed BS= 124, HgA1c= 6.4.Marland KitchenMarland Kitchen rec- same meds, better diet! ~  labs 12/09 showed BS= 126, A1c= 6.1 ~  labs 10/10 showed BS= 122, A1c= 6.1 ~  labs 3/11 showed BS= 120, A1c= 6.3 ~  labs 12/11  showed BS= 103, A1c= 6.1 ~  Labs 6/12 showed BS= 108, A1c not done... ~  Labs 12/12 showed BS= 121, A1c= 6.3  HYPERCHOLESTEROLEMIA (ICD-272.0) - now on SIMVASTATIN 40mg /d (prev Vytorin, then Simva80). ~  FLP 6/08 showed TChol 141, TG 80, HDL 66, LDL 59... LFT's were normal. ~  FLP 6/09 on Vytorin10/40 showed TChol 223, TG 105, HDL 56, LDL 143... rec- incr to 10/80... ~  FLP 12/09 on Vytorin 10/80 showed TChol 159, TG 62, HDL 89, LDL 58... ch to Simva80 for $$ ~  FLP 10/10 on Simva80 showed TChol 187, TG 78, HDL 64, LDL 107 ~  Simvastatin was decreased to 40mg /d based on the new guidelines... ~  FLP 12/11 on Simva40 showed TChol 170, TG 62, HDL 68, LDL 90 ~  FLP 6/12 on Simva40 showed TChol 170, TG 86, HDL 68, LDL 85 ~  FLP 12/12  on Simva40 showed TChol 176, TG 74, HDL 78, LDL 82  OBESITY (ICD-278.00) - we discussed diet + exercise required to lose weight... ~  weight 1/07 = 188# ~  weight 12/08 = 202# ~  weight 12/09 = 201# ~  weight 3/11 = 201# ~  weight 11/11 = 207# ~  Weight 6/12 = 202# ~  Weight 12/12 = 205#  DIVERTICULOSIS OF COLON (ICD-562.10) - there is a family hx of colon cancer... ~  colonoscopy 2/03 by DrSam showed divertics only...  ~  f/u colonoscopy 11/10 by DrJacobs showed 1 sm polyp= tubular adenoma, & divertics...  Hx of BEN NEOPLASM PANCREAS EXCEPT ISLETS LANGERHANS (ICD-211.6) - she is s/p resection of a serous cystadenoma of the pancreas at [facility] by DrTyler in 2004... she has a routine f/u appt sched yearly> now every other yr.  DEGENERATIVE JOINT DISEASE (ICD-715.90) - uses ETODOLAC 400mg Bid as needed... she recently saw DrDean w/ shots in her knees... ~  11/11:  she reports early CTS per DrDean & Rx w/ wrist splints & improved...  VITAMIN D DEFICIENCY (ICD-268.9) - Vit D level 6/09 was 20 & pt Rx w/ 50,000 u weekly... ~  labs 12/09 showed Vit D level = 30... she chose to continue the 50K weekly. ~  labs 3/11 showed Vit D level = 28... ?taking 50K each week?- continue. ~  Labs 6/12 showed Vit D = 40... rec to continue 50K ewach week...  Hx of COMMON MIGRAINE (ICD-346.10) -prev eval by DrAdelman in the past...  ANXIETY DISORDER, GENERALIZED (ICD-300.02)  ANEMIA NOS (ICD-285.9) - on FeSO4 + Vit C daily... ~  labs 6/09 showed Hg~ 10, Hct= 30, MCV= 78, Fe= 70... ~  labs 12/09 showed Hg= 10.4, MCV= 79... ~  labs 3/11 showed Hg= not done... ~  labs 11/11 showed Hg= 9.5, MVC= 80, Fe= 61 (12%sat) ~  Labs 6/12 showed Hg= 10.0, MCV= 81 ~  Labs 12/12 showed Hg= 9.8, MCV= 80, Fe= 34 (7%sat) on Fe+VitC daily; followed for GI by DrJacobs w/ colon 11/10 (1 sm polyp removed); rec to incr Fe/VitC to Bid & may need IV Fe if not responding.  MONOCLONAL GAMMOPATHY (ICD-273.1) - MGUS diagnosed w/ prev  anemia work up... she has a monoclonal IgG Kappa paraprotein identified... we have been following SPE/ IEP yearly without any progression...  ~  labs 6/08 showed monoclonal IgG kappa protein- 0.37g/dl & quant Ig's showed IgG level = 978 mg/dl. ~  labs 1/61 showed monoclonal IgG kappa protein- 0.41 g/dl & quant Ig's showed IgG level = 1260 mg/dl. ~  labs 0/96 showed monoclonal  IgG kappa protein- 0.37g/dl & quant Ig's showed IgG level = 1190 mg/dl. ~  Labs 2/95 showed monoclonal IgG kappa protein- 0.41g/dl & quant Ig's showed IgG level = 1190 mg/dl   No past surgical history on file.   Outpatient Encounter Prescriptions as of 01/29/2011  Medication Sig Dispense Refill  . ACTOS 15 MG tablet TAKE 1 TABLET BY MOUTH EVERY DAY  30 tablet  4  . aspirin 81 MG tablet Take 81 mg by mouth daily.        Marland Kitchen diltiazem (CARDIZEM CD) 240 MG 24 hr capsule Take 240 mg by mouth daily.        . ergocalciferol (VITAMIN D2) 50000 UNITS capsule Take 50,000 Units by mouth once a week.        . etodolac (LODINE) 400 MG tablet Take 400 mg by mouth daily. As needed for arthritis pain       . ferrous fumarate (HEMOCYTE - 106 MG FE) 325 (106 FE) MG TABS Take 325 mg by mouth daily. Along with vitamin c 500mg  daily       . labetalol (NORMODYNE) 200 MG tablet Take 200 mg by mouth 2 (two) times daily.        Marland Kitchen losartan-hydrochlorothiazide (HYZAAR) 100-25 MG per tablet Take 1 tablet by mouth daily.        . metFORMIN (GLUCOPHAGE) 500 MG tablet Take 500 mg by mouth 2 (two) times daily with a meal.        . simvastatin (ZOCOR) 40 MG tablet TAKE 1 TABLET BY MOUTH AT BEDTIME  30 tablet  4    Allergies  Allergen Reactions  . Piroxicam     REACTION: HEADACHE    Current Medications, Allergies, Past Medical History, Past Surgical History, Family History, and Social History were reviewed in [employer] record.    Review of Systems        See HPI - all other systems neg except as noted... The patient  complains of dyspnea on exertion.  The patient denies anorexia, fever, weight loss, weight gain, vision loss, decreased hearing, hoarseness, chest pain, syncope, peripheral edema, prolonged cough, headaches, hemoptysis, abdominal pain, melena, hematochezia, severe indigestion/heartburn, hematuria, incontinence, muscle weakness, suspicious skin lesions, transient blindness, difficulty walking, depression, unusual weight change, abnormal bleeding, enlarged lymph nodes, and angioedema.     Objective:   Physical Exam     WD, Overweight,  77 y/o BF in NAD... GENERAL:  Alert & oriented; pleasant & cooperative... HEENT:  [place]/AT, EOM-wnl, PERRLA, EACs-clear, TMs-wnl, NOSE-clear, THROAT-clear & wnl. NECK:  Supple w/ fairROM; no JVD; normal carotid impulses w/o bruits; no thyromegaly or nodules palpated; no lymphadenopathy. CHEST:  Clear to P & A; without wheezes/ rales/ or rhonchi. HEART:  Regular Rhythm; without murmurs/ rubs/ or gallops. ABDOMEN:  Soft & nontender; scar of prev surg; normal bowel sounds; no organomegaly or masses detected. EXT: without deformities, mod arthritic changes; no varicose veins/ +venous insuffic/ no edema. NEURO:  CN's intact;  no focal neuro deficits... DERM:  No lesions noted; no rash etc...  RADIOLOGY DATA:  Reviewed in the EPIC EMR & discussed w/ the patient...  LABORATORY DATA:  Reviewed in the EPIC EMR & discussed w/ the patient...   Assessment & Plan:   AR/ Bronchitis>  Stable & breathing well; recent URI treated w/ ZPak & Hycodan prn...  HBP>  Controlled on meds; continue same; get on diet & get wt down!  DM>  Stable on Metform + Actos; but really needs  better diet, get wt down...  CHOL>  Stable on Simva40; same thing applies to diet efforts!  OBESITY>  We again reviewed diet + exercise prescriptions...  GI> Divertics/ Polyps>  Followed by DrJacobs and up to date w/ colon 11/10 & 1 sm polyp removed...  Hx Pancreatic serous cystadenoma removed at [facility]  2004> stable & doing well...  DJD>  On Etodolac & followed by DrDean...  Vit D defic>  Remains on Vit D 50K weekly...  Anemia & MGUS>  Persist anemia & Fe is low despite daily supplement> rec to incr to Bid & may need IV Fe; no progression of the MGUS.   Patient's Medications  New Prescriptions   No medications on file  Previous Medications   ASPIRIN 81 MG TABLET    Take 81 mg by mouth daily.     FERROUS FUMARATE (HEMOCYTE - 106 MG FE) 325 (106 FE) MG TABS    Take 325 mg by mouth daily. Along with vitamin c 500mg  daily   Modified Medications   Modified Medication Previous Medication   DILTIAZEM (CARDIZEM CD) 240 MG 24 HR CAPSULE diltiazem (CARDIZEM CD) 240 MG 24 hr capsule      Take 1 capsule (240 mg total) by mouth daily.    Take 240 mg by mouth daily.     ERGOCALCIFEROL (VITAMIN D2) 50000 UNITS CAPSULE ergocalciferol (VITAMIN D2) 50000 UNITS capsule      Take 1 capsule (50,000 Units total) by mouth once a week.    Take 50,000 Units by mouth once a week.     ETODOLAC (LODINE) 400 MG TABLET etodolac (LODINE) 400 MG tablet      Take 1 tablet (400 mg total) by mouth daily. As needed for arthritis pain    Take 400 mg by mouth daily. As needed for arthritis pain    LABETALOL (NORMODYNE) 200 MG TABLET labetalol (NORMODYNE) 200 MG tablet      Take 1 tablet (200 mg total) by mouth 2 (two) times daily.    Take 200 mg by mouth 2 (two) times daily.     LOSARTAN-HYDROCHLOROTHIAZIDE (HYZAAR) 100-25 MG PER TABLET losartan-hydrochlorothiazide (HYZAAR) 100-25 MG per tablet      Take 1 tablet by mouth daily.    Take 1 tablet by mouth daily.     METFORMIN (GLUCOPHAGE) 500 MG TABLET metFORMIN (GLUCOPHAGE) 500 MG tablet      TAKE 1 TABLET BY MOUTH TWICE A DAY    Take 500 mg by mouth 2 (two) times daily with a meal.     PIOGLITAZONE (ACTOS) 15 MG TABLET ACTOS 15 MG tablet      Take 1 tablet (15 mg total) by mouth daily.    TAKE 1 TABLET BY MOUTH EVERY DAY   SIMVASTATIN (ZOCOR) 40 MG TABLET simvastatin  (ZOCOR) 40 MG tablet      Take 1 tablet (40 mg total) by mouth at bedtime.    TAKE 1 TABLET BY MOUTH AT BEDTIME  Discontinued Medications   No medications on file

## 2011-01-29 NOTE — Patient Instructions (Signed)
Today we updated your med list in our EPIC system...    Continue your current medications the same...  For your upper resp infection:    We have prescribed a ZPAK & HYCODAN cough syrup...     Rest, Tylenol, lots of water/ fluids...  Today we did your follow up CXR & fasting blood work...    Please call the PHONE TREE in a few days for your results...    Dial [id number] & when prompted enter your patient number followed by the # symbol...    Your patient number is:  [id number]#  Let's get on track w/ our diet & the Silver Sneakers exercise program!!!  Call for any questions...  Let's continue our every 9month follow ups> & we will check your full fasting labs at that time.Marland KitchenMarland Kitchen

## 2011-01-30 ENCOUNTER — Other Ambulatory Visit: Payer: Self-pay | Admitting: Pulmonary Disease

## 2011-01-30 DIAGNOSIS — D472 Monoclonal gammopathy: Secondary | ICD-10-CM

## 2011-01-30 DIAGNOSIS — D136 Benign neoplasm of pancreas: Secondary | ICD-10-CM

## 2011-01-30 LAB — IBC PANEL: Iron: 34 ug/dL — ABNORMAL LOW (ref 42–145)

## 2011-01-31 ENCOUNTER — Telehealth: Payer: Self-pay | Admitting: Pulmonary Disease

## 2011-01-31 NOTE — Telephone Encounter (Signed)
ATC pt's son, not a working number.  Called spoke with patient at home, advised that SN prescribed her a zpak and cough syrup when she was in the office.  Pt stated that she "didn't know if a zpak was an antibiotic or not."  i advised pt that yes, it is an abx and will take a few days to get into her system and thereby improving her symptoms.  Pt then informed me that she does feel better today.  Encouraged pt to also try taking mucinex dm 1-2 twice daily with plenty of fluids to help with the cough/congestion.  Pt verbalized her understanding and will call if symptoms do not improve or worsen.  Pt to call her son to let him know that we have spoken.  Nothing further needed, will sign off.

## 2011-02-01 ENCOUNTER — Other Ambulatory Visit: Payer: Self-pay | Admitting: Pulmonary Disease

## 2011-02-24 ENCOUNTER — Encounter: Payer: Self-pay | Admitting: Pulmonary Disease

## 2011-04-30 ENCOUNTER — Telehealth: Payer: Self-pay | Admitting: Pulmonary Disease

## 2011-04-30 NOTE — Telephone Encounter (Signed)
Called and gave verbal for refill until 07-2011 as patient is due in then and can get new Rx for 1 year.

## 2011-06-23 ENCOUNTER — Other Ambulatory Visit: Payer: Self-pay | Admitting: Pulmonary Disease

## 2011-08-02 ENCOUNTER — Other Ambulatory Visit (INDEPENDENT_AMBULATORY_CARE_PROVIDER_SITE_OTHER): Payer: Medicare Other

## 2011-08-02 ENCOUNTER — Ambulatory Visit (INDEPENDENT_AMBULATORY_CARE_PROVIDER_SITE_OTHER): Payer: Medicare Other | Admitting: Pulmonary Disease

## 2011-08-02 ENCOUNTER — Encounter: Payer: Self-pay | Admitting: Pulmonary Disease

## 2011-08-02 VITALS — BP 144/80 | HR 73 | Temp 97.8°F | Ht 64.0 in | Wt 203.0 lb

## 2011-08-02 DIAGNOSIS — E559 Vitamin D deficiency, unspecified: Secondary | ICD-10-CM

## 2011-08-02 DIAGNOSIS — M1990 Unspecified osteoarthritis, unspecified site: Secondary | ICD-10-CM

## 2011-08-02 DIAGNOSIS — F411 Generalized anxiety disorder: Secondary | ICD-10-CM

## 2011-08-02 DIAGNOSIS — E669 Obesity, unspecified: Secondary | ICD-10-CM

## 2011-08-02 DIAGNOSIS — E119 Type 2 diabetes mellitus without complications: Secondary | ICD-10-CM

## 2011-08-02 DIAGNOSIS — E7800 Pure hypercholesterolemia, unspecified: Secondary | ICD-10-CM

## 2011-08-02 DIAGNOSIS — D472 Monoclonal gammopathy: Secondary | ICD-10-CM

## 2011-08-02 DIAGNOSIS — D649 Anemia, unspecified: Secondary | ICD-10-CM

## 2011-08-02 DIAGNOSIS — D136 Benign neoplasm of pancreas: Secondary | ICD-10-CM

## 2011-08-02 DIAGNOSIS — I10 Essential (primary) hypertension: Secondary | ICD-10-CM

## 2011-08-02 DIAGNOSIS — K5730 Diverticulosis of large intestine without perforation or abscess without bleeding: Secondary | ICD-10-CM

## 2011-08-02 LAB — CBC WITH DIFFERENTIAL/PLATELET
Basophils Relative: 1.9 % (ref 0.0–3.0)
Eosinophils Relative: 4.9 % (ref 0.0–5.0)
Lymphocytes Relative: 22.4 % (ref 12.0–46.0)
Neutrophils Relative %: 60.7 % (ref 43.0–77.0)
RBC: 3.53 Mil/uL — ABNORMAL LOW (ref 3.87–5.11)
WBC: 5.5 10*3/uL (ref 4.5–10.5)

## 2011-08-02 LAB — BASIC METABOLIC PANEL
BUN: 20 mg/dL (ref 6–23)
CO2: 25 mEq/L (ref 19–32)
Chloride: 110 mEq/L (ref 96–112)
Creatinine, Ser: 1.1 mg/dL (ref 0.4–1.2)
Glucose, Bld: 103 mg/dL — ABNORMAL HIGH (ref 70–99)

## 2011-08-02 LAB — IBC PANEL
Iron: 55 ug/dL (ref 42–145)
Saturation Ratios: 13.4 % — ABNORMAL LOW (ref 20.0–50.0)

## 2011-08-02 LAB — TSH: TSH: 1.86 u[IU]/mL (ref 0.35–5.50)

## 2011-08-02 LAB — LIPID PANEL
Cholesterol: 146 mg/dL (ref 0–200)
HDL: 71.7 mg/dL (ref >39.00)
VLDL: 14.8 mg/dL (ref 0.0–40.0)

## 2011-08-02 LAB — HEPATIC FUNCTION PANEL
ALT: 11 U/L (ref 0–35)
Albumin: 3.7 g/dL (ref 3.5–5.2)
Total Protein: 7 g/dL (ref 6.0–8.3)

## 2011-08-02 LAB — HEMOGLOBIN A1C: Hgb A1c MFr Bld: 6 % (ref 4.6–6.5)

## 2011-08-02 NOTE — Patient Instructions (Signed)
Today we updated your med list in our EPIC system...    Continue your current medications the same for now...  Today we did your follow up FASTING blood work...    We will call you w/ the results when avail...  Call for any questions...  Congrats on your retirement after 53+ yrs ant [employer]...  Let's plan a follow up visit in 6 months.Marland KitchenMarland Kitchen

## 2011-08-02 NOTE — Progress Notes (Signed)
Subjective:    Patient ID: Wendy Kerr, female    DOB: February 10, 1931, 78 y.o.   MRN: [id number]  HPI  Review of Systems  Objective:  Physical Exam  Assessment & Plan:     Subjective:    Patient ID: Wendy Kerr, female    DOB: 1931/02/07, 78 y.o.   MRN: [id number]  HPI 78 y/o BF here for a follow up visit... she has multiple medical problems including HBP;  Hyperchol & DM;  Obesity;  Divertics;  DJD;  & Anemia w/ MGUS...  she still works at [employer]	[place] 3 d per week... her granddau is a Optometrist in [employer] residency).  ~  May 16, 2009:  I last saw Wendy Kerr 12/09- notes reviewed... she is due for f/u of her MGUS... she hasn't been able to lose weight-  BP controlled;  BS under control w/ A1c's in the low 6's;  Chol fair on the Simva80...  we discussed diet + exercise & weight reduction strategies...  ~  January 23, 2010:  despite all her best efforts she has gained 7# up to 207# today... BP remains under control;  FLP looks reasonably well controlled on Simva40;  BS & A1c continue to look good on Metformin & Actos (I told her she could save $$ & stop the actos if she got her wt down)...  ~  June 4,2012:  44mo ROV & she reports stable> still working part time at [employer] thinking again about retirement;  BP stable on Labetolol, Cardizem, & Hyzaar;  Chol controlled on Simva40;  DM controlled on Metformin & Actos;  Weight down 5# to 202# today;  Stable anemia w/ Hg=10 & MMpanel stable MGUS w/o progression...  rec to continue same meds, get on diet, get wt down...  ~  January 29, 2011:  44mo ROV & she notes a URI/ "head cold" starting yest w/ cough & whitish sput production; denies f/c/s, SOB, CP, etc... We decided to check CXR (neg- no infiltrate) & treat w/ ZPak & Hycodan... otherw doing well> BP remains controlled on her ; FLP looks good on Simva40; DM control excellent on Metform/Actos; unfortunately wt is not coming down & we reviewed diet + she'll [facility]; athritis discomfort controlled w/ Etololac & Tylenol; known mild anemia & MGUS that is stable but Fe is diminished despite Fe+VitC daily (rec to incr to Bid & may need Iron IV if not improving)...  ~  August 02, 2011:  44mo ROV & Wendy Kerr is finally retiring from [employer]	[place] after 53 yrs on the job!  She is planning on joining the Y & silver sneakers to aid in wt reduction; her CC is arthritis pain (esp hands) but she has LODINE for prn use & encouraged to take it & try hot soaks...      We reviewed prob list, meds, xrays and labs> see below>> LABS 6/13:  FLP- at goals on Simva40;  Chems- ok w/ BS=103 A1c=6.0;  CBC- anemic w/ Hg=9.1 Fe=55 (13%);  TSH=1.86;  VitD=43;  SPE/IEP- pending          Problem List:  ALLERGIC RHINITIS>  She is still on weekly allergy shots per DrESL...  Hx of BRONCHITIS, RECURRENT (ICD-491.9) ~  12/12:  Presents w/ URI, bronchitic exac treated w/ ZPak & Hycodan... ~  CXR 12/12 showed normal heart size, prominent right paratrach region (vasc structures), clear lungs...  HYPERTENSION (ICD-401.9) - on ASA 81mg /d, LABETOLOL 200mg Bid, CARDIZEM CD 240mg /d, HYZAAR 100-25 daily...  ~  12/12: BP= 140/82 and denies HA, fatigue, visual changes, CP, palipit, dizziness, syncope, dyspnea, edema, etc... ~  6/13: BP= 144/80 & she remains asymptomatic...  DIABETES MELLITUS (ICD-250.00) - on METFORMIN 500mg Bid, ACTOS 15mg /d... she has a difficult time w/ diet & exercise esp due to her knee arthritis & we discussed swimming etc...Marland KitchenMarland Kitchen. BS at home betw 110-140 she says... ~  labs 6/08 showed BS= 124, HgA1c= 6.3 ~  labs 6/09 showed BS= 124, HgA1c= 6.4.Marland KitchenMarland Kitchen rec- same meds, better diet! ~  labs 12/09 showed BS= 126, A1c= 6.1 ~  labs 10/10 showed BS= 122, A1c= 6.1 ~  labs 3/11 showed BS= 120, A1c= 6.3 ~  labs 12/11 showed BS= 103, A1c= 6.1 ~  Labs 6/12 showed BS= 108, A1c not done... ~  Labs 12/12 showed BS= 121, A1c= 6.3 ~  Labs 6/13 on Metform500Bid+Actos15 showed BS=103, A1c=6.0;  rec to STOP Actos...  HYPERCHOLESTEROLEMIA (ICD-272.0) - now on SIMVASTATIN 40mg /d (prev Vytorin, then Simva80). ~  FLP 6/08 showed TChol 141, TG 80, HDL 66, LDL 59... LFT's were normal. ~  FLP 6/09 on Vytorin10/40 showed TChol 223, TG 105, HDL 56, LDL 143... rec- incr to 10/80... ~  FLP 12/09 on Vytorin 10/80 showed TChol 159, TG 62, HDL 89, LDL 58... ch to Simva80 for $$ ~  FLP 10/10 on Simva80 showed TChol 187, TG 78, HDL 64, LDL 107 ~  Simvastatin was decreased to 40mg /d based on the new guidelines... ~  FLP 12/11 on Simva40 showed TChol 170, TG 62, HDL 68, LDL 90 ~  FLP 6/12 on Simva40 showed TChol 170, TG 86, HDL 68, LDL 85 ~  FLP 12/12 on Simva40 showed TChol 176, TG 74, HDL 78, LDL 82 ~  FLP 6/13 on Simva40 showed TChol 146, TG 74, HDL 72, LDL 60  OBESITY (ICD-278.00) - we discussed diet + exercise required to lose weight... ~  weight 1/07 = 188# ~  weight 12/08 = 202# ~  weight 12/09 = 201# ~  weight 3/11 = 201# ~  weight 11/11 = 207# ~  Weight 6/12 = 202# ~  Weight 12/12 = 205# ~  Weight 6/13 = 204#  DIVERTICULOSIS OF COLON (ICD-562.10) - there is a family hx of colon cancer... ~  colonoscopy 2/03 by DrSam showed divertics only...  ~  f/u colonoscopy 11/10 by DrJacobs showed 1 sm polyp= tubular adenoma, & divertics...  Hx of BEN NEOPLASM PANCREAS EXCEPT ISLETS LANGERHANS (ICD-211.6) - she is s/p resection of a serous cystadenoma of the pancreas at [facility] by DrTyler in 2004... she has a routine f/u appt sched yearly> now every other yr.  DEGENERATIVE JOINT DISEASE (ICD-715.90) - uses ETODOLAC 400mg Bid as needed... she recently saw DrDean w/ shots in her knees... ~  11/11:  she reports early CTS per DrDean & Rx w/ wrist splints & improved...  VITAMIN D DEFICIENCY (ICD-268.9) - Vit D level 6/09 was 20 & pt Rx w/ 50,000 u weekly... ~  labs 12/09 showed Vit D level = 30... she chose to continue the 50K weekly. ~  labs 3/11 showed Vit D level = 28... ?taking 50K each week?-  continue. ~  Labs 6/12 showed Vit D = 40... rec to continue 50K ewach week... ~  Labs 6/13 showed Vit D level = 43  Hx of COMMON MIGRAINE (ICD-346.10) -prev eval by DrAdelman in the past...  ANXIETY DISORDER, GENERALIZED (ICD-300.02)  ANEMIA NOS (ICD-285.9) - on FeSO4 + Vit C daily... ~  labs 6/09 showed Hg~ 10, Hct=  30, MCV= 78, Fe= 70... ~  labs 12/09 showed Hg= 10.4, MCV= 79... ~  labs 3/11 showed Hg= not done... ~  labs 11/11 showed Hg= 9.5, MVC= 80, Fe= 61 (12%sat) ~  Labs 6/12 showed Hg= 10.0, MCV= 81 ~  Labs 12/12 showed Hg= 9.8, MCV= 80, Fe= 34 (7%sat) on Fe+VitC daily; followed for GI by DrJacobs w/ colon 11/10 (1 sm polyp removed); rec to incr Fe/VitC to Bid & may need IV Fe if not responding. ~  Labs 6/13 on Fe one daily showed Hg= 9.1, MCV= 81, Fe= 55 (13%sat); rec incr Fe to Bid dosing...  MONOCLONAL GAMMOPATHY (ICD-273.1) - MGUS diagnosed w/ prev anemia work up... she has a monoclonal IgG Kappa paraprotein identified... we have been following SPE/ IEP yearly without any progression...  ~  labs 6/08 showed monoclonal IgG kappa protein- 0.37g/dl & quant Ig's showed IgG level = 978 mg/dl. ~  labs 1/61 showed monoclonal IgG kappa protein- 0.41 g/dl & quant Ig's showed IgG level = 1260 mg/dl. ~  labs 0/96 showed monoclonal IgG kappa protein- 0.37g/dl & quant Ig's showed IgG level = 1190 mg/dl. ~  Labs 0/45 showed monoclonal IgG kappa protein- 0.41g/dl & quant Ig's showed IgG level = 1190 mg/dl ~  Labs 4/09 showed monoclonal IgG kappa protein- 0.39 g/dl & quant Ig's showed IgG level = 1110mg /dl  No past surgical history on file.   Outpatient Encounter Prescriptions as of 08/02/2011  Medication Sig Dispense Refill  . aspirin 81 MG tablet Take 81 mg by mouth daily.        Marland Kitchen diltiazem (CARDIZEM CD) 240 MG 24 hr capsule Take 1 capsule (240 mg total) by mouth daily.  30 capsule  11  . ergocalciferol (VITAMIN D2) 50000 UNITS capsule Take 1 capsule (50,000 Units total) by mouth once a  week.  4 capsule  11  . etodolac (LODINE) 400 MG tablet Take 1 tablet (400 mg total) by mouth daily. As needed for arthritis pain  30 tablet  11  . ferrous sulfate 324 (65 FE) MG TBEC TAKE 1 TABLET BY MOUTH DAIL YWITH 500 MG VITAMIN C  30 tablet  3  . labetalol (NORMODYNE) 200 MG tablet Take 1 tablet (200 mg total) by mouth 2 (two) times daily.  60 tablet  11  . losartan-hydrochlorothiazide (HYZAAR) 100-25 MG per tablet Take 1 tablet by mouth daily.  30 tablet  11  . metFORMIN (GLUCOPHAGE) 500 MG tablet TAKE 1 TABLET BY MOUTH TWICE A DAY  60 tablet  11  . pioglitazone (ACTOS) 15 MG tablet Take 1 tablet (15 mg total) by mouth daily.  30 tablet  11  . simvastatin (ZOCOR) 40 MG tablet Take 1 tablet (40 mg total) by mouth at bedtime.  30 tablet  11  . DISCONTD: ferrous fumarate (HEMOCYTE - 106 MG FE) 325 (106 FE) MG TABS Take 325 mg by mouth daily. Along with vitamin c 500mg  daily         Allergies  Allergen Reactions  . Piroxicam     REACTION: HEADACHE    Current Medications, Allergies, Past Medical History, Past Surgical History, Family History, and Social History were reviewed in [employer] record.    Review of Systems        See HPI - all other systems neg except as noted... The patient complains of dyspnea on exertion.  The patient denies anorexia, fever, weight loss, weight gain, vision loss, decreased hearing, hoarseness, chest pain, syncope, peripheral edema,  prolonged cough, headaches, hemoptysis, abdominal pain, melena, hematochezia, severe indigestion/heartburn, hematuria, incontinence, muscle weakness, suspicious skin lesions, transient blindness, difficulty walking, depression, unusual weight change, abnormal bleeding, enlarged lymph nodes, and angioedema.     Objective:   Physical Exam     WD, Overweight,  78 y/o BF in NAD... GENERAL:  Alert & oriented; pleasant & cooperative... HEENT:  [place]/AT, EOM-wnl, PERRLA, EACs-clear, TMs-wnl, NOSE-clear,  THROAT-clear & wnl. NECK:  Supple w/ fairROM; no JVD; normal carotid impulses w/o bruits; no thyromegaly or nodules palpated; no lymphadenopathy. CHEST:  Clear to P & A; without wheezes/ rales/ or rhonchi. HEART:  Regular Rhythm; without murmurs/ rubs/ or gallops. ABDOMEN:  Soft & nontender; scar of prev surg; normal bowel sounds; no organomegaly or masses detected. EXT: without deformities, mod arthritic changes; no varicose veins/ +venous insuffic/ no edema. NEURO:  CN's intact;  no focal neuro deficits... DERM:  No lesions noted; no rash etc...  RADIOLOGY DATA:  Reviewed in the EPIC EMR & discussed w/ the patient...  LABORATORY DATA:  Reviewed in the EPIC EMR & discussed w/ the patient...   Assessment & Plan:   AR/ Bronchitis>  Stable & breathing well; recent URI treated w/ ZPak & Hycodan prn...  HBP>  Controlled on meds; continue same; get on diet & get wt down!  DM>  Control is tight on Metform + Actos; ok to STOP Actos & needs to get wt down!  CHOL>  Stable on Simva40; same thing applies to diet efforts!  OBESITY>  We again reviewed diet + exercise prescriptions...  GI> Divertics/ Polyps>  Followed by DrJacobs and up to date w/ colon 11/10 & 1 sm polyp removed...  Hx Pancreatic serous cystadenoma removed at [facility] 2004> stable & doing well; she follows up at [facility] every other yr.  DJD>  On Etodolac & followed by DrDean...  Vit D defic>  Remains on Vit D 50K weekly...  Anemia & MGUS>  Persist anemia (Hg=9.1 now) & Fe is borderline (55- 13%sat) on daily supplement> rec to incr to Bid & may need IV Fe; no progression of the MGUS w/ IgGkappa prot & 0.39gm/dL of 4.54UJ/WJ total (all parameters about the same as last yr)...   Patient's Medications  New Prescriptions   No medications on file  Previous Medications   ASPIRIN 81 MG TABLET    Take 81 mg by mouth daily.     DILTIAZEM (CARDIZEM CD) 240 MG 24 HR CAPSULE    Take 1 capsule (240 mg total) by mouth daily.   ERGOCALCIFEROL  (VITAMIN D2) 50000 UNITS CAPSULE    Take 1 capsule (50,000 Units total) by mouth once a week.   ETODOLAC (LODINE) 400 MG TABLET    Take 1 tablet (400 mg total) by mouth daily. As needed for arthritis pain   FERROUS SULFATE 324 (65 FE) MG TBEC    TAKE 1 TABLET BY MOUTH DAIL YWITH 500 MG VITAMIN C   LABETALOL (NORMODYNE) 200 MG TABLET    Take 1 tablet (200 mg total) by mouth 2 (two) times daily.   LOSARTAN-HYDROCHLOROTHIAZIDE (HYZAAR) 100-25 MG PER TABLET    Take 1 tablet by mouth daily.   METFORMIN (GLUCOPHAGE) 500 MG TABLET    TAKE 1 TABLET BY MOUTH TWICE A DAY   PIOGLITAZONE (ACTOS) 15 MG TABLET    Take 1 tablet (15 mg total) by mouth daily.   SIMVASTATIN (ZOCOR) 40 MG TABLET    Take 1 tablet (40 mg total) by mouth at bedtime.  Modified  Medications   No medications on file  Discontinued Medications   FERROUS FUMARATE (HEMOCYTE - 106 MG FE) 325 (106 FE) MG TABS    Take 325 mg by mouth daily. Along with vitamin c 500mg  daily

## 2011-08-03 LAB — VITAMIN D 25 HYDROXY (VIT D DEFICIENCY, FRACTURES): Vit D, 25-Hydroxy: 43 ng/mL (ref 30–89)

## 2011-08-06 LAB — IFE INTERPRETATION

## 2011-08-06 LAB — PROTEIN ELECTROPHORESIS, SERUM, WITH REFLEX
Albumin ELP: 54.3 % — ABNORMAL LOW (ref 55.8–66.1)
Alpha-1-Globulin: 4.7 % (ref 2.9–4.9)

## 2011-08-14 ENCOUNTER — Telehealth: Payer: Self-pay | Admitting: Pulmonary Disease

## 2011-08-14 NOTE — Telephone Encounter (Signed)
Notes Recorded by Michele Mcalpine, MD on 08/07/2011 at 6:50 AM Please notify patient>  FLP looks great on Simva40> continue same... Chems look good w/ BS=103, A1c=6.0> DM control too tight & rec continue MetformBid but STOP ACTOS now... CBC w/ Anemia & rec to incr Fe to Bid; MGUS is stable, no change, & we will continue to monitor her labs... Thyroid/ VitD/ etc were all ok...  I spoke with patient about results and she verbalized understanding and had no questions

## 2011-08-20 ENCOUNTER — Telehealth: Payer: Self-pay | Admitting: Pulmonary Disease

## 2011-08-20 MED ORDER — FERROUS SULFATE 324 (65 FE) MG PO TBEC: 1.0000 | DELAYED_RELEASE_TABLET | Freq: Two times a day (BID) | ORAL | Status: DC

## 2011-08-20 NOTE — Telephone Encounter (Signed)
RX has been sent to reflect change per SN recs. Nothing further was needed

## 2012-01-14 ENCOUNTER — Other Ambulatory Visit: Payer: Self-pay | Admitting: Pulmonary Disease

## 2012-01-28 ENCOUNTER — Encounter: Payer: Self-pay | Admitting: *Deleted

## 2012-01-29 ENCOUNTER — Other Ambulatory Visit (INDEPENDENT_AMBULATORY_CARE_PROVIDER_SITE_OTHER): Payer: Medicare Other

## 2012-01-29 ENCOUNTER — Encounter: Payer: Self-pay | Admitting: Pulmonary Disease

## 2012-01-29 ENCOUNTER — Ambulatory Visit (INDEPENDENT_AMBULATORY_CARE_PROVIDER_SITE_OTHER): Payer: Medicare Other | Admitting: Pulmonary Disease

## 2012-01-29 VITALS — BP 164/82 | HR 103 | Temp 99.3°F | Ht 64.0 in | Wt 203.0 lb

## 2012-01-29 DIAGNOSIS — K5730 Diverticulosis of large intestine without perforation or abscess without bleeding: Secondary | ICD-10-CM

## 2012-01-29 DIAGNOSIS — D649 Anemia, unspecified: Secondary | ICD-10-CM

## 2012-01-29 DIAGNOSIS — E119 Type 2 diabetes mellitus without complications: Secondary | ICD-10-CM

## 2012-01-29 DIAGNOSIS — M1990 Unspecified osteoarthritis, unspecified site: Secondary | ICD-10-CM

## 2012-01-29 DIAGNOSIS — I10 Essential (primary) hypertension: Secondary | ICD-10-CM

## 2012-01-29 DIAGNOSIS — E669 Obesity, unspecified: Secondary | ICD-10-CM

## 2012-01-29 DIAGNOSIS — D472 Monoclonal gammopathy: Secondary | ICD-10-CM

## 2012-01-29 DIAGNOSIS — D136 Benign neoplasm of pancreas: Secondary | ICD-10-CM

## 2012-01-29 DIAGNOSIS — E559 Vitamin D deficiency, unspecified: Secondary | ICD-10-CM

## 2012-01-29 DIAGNOSIS — G43009 Migraine without aura, not intractable, without status migrainosus: Secondary | ICD-10-CM

## 2012-01-29 DIAGNOSIS — E7800 Pure hypercholesterolemia, unspecified: Secondary | ICD-10-CM

## 2012-01-29 DIAGNOSIS — F411 Generalized anxiety disorder: Secondary | ICD-10-CM

## 2012-01-29 LAB — BASIC METABOLIC PANEL
Chloride: 105 mEq/L (ref 96–112)
GFR: 48.17 mL/min — ABNORMAL LOW (ref >60.00)
Glucose, Bld: 138 mg/dL — ABNORMAL HIGH (ref 70–99)
Potassium: 4.9 mEq/L (ref 3.5–5.1)
Sodium: 135 mEq/L (ref 135–145)

## 2012-01-29 LAB — CBC WITH DIFFERENTIAL/PLATELET
Eosinophils Relative: 2.6 % (ref 0.0–5.0)
HCT: 32.3 % — ABNORMAL LOW (ref 36.0–46.0)
Hemoglobin: 10.5 g/dL — ABNORMAL LOW (ref 12.0–15.0)
Lymphs Abs: 1.3 10*3/uL (ref 0.7–4.0)
MCV: 79.5 fl (ref 78.0–100.0)
Monocytes Relative: 7 % (ref 3.0–12.0)
Neutro Abs: 5.2 10*3/uL (ref 1.4–7.7)
WBC: 7.3 10*3/uL (ref 4.5–10.5)

## 2012-01-29 LAB — HEMOGLOBIN A1C: Hgb A1c MFr Bld: 6.5 % (ref 4.6–6.5)

## 2012-01-29 MED ORDER — LABETALOL HCL 200 MG PO TABS: ORAL_TABLET | ORAL | Status: DC

## 2012-01-29 NOTE — Patient Instructions (Addendum)
Today we updated your med list in our EPIC system...    Continue your current medications the same...  We decided to increase your LABETOLOL to 2 tabs twice daily for your BP...  We also did your follow up labs today...    We will call you w/ the results when avail...  Let's get on track w/ our diet & exercise program...    The goal is to lose 10-15 lbs (or more)...  Call for any questions...  Let's plan a follow up visit in 6 months time.Marland KitchenMarland Kitchen

## 2012-01-29 NOTE — Progress Notes (Signed)
Subjective:    Patient ID: Wendy Kerr, female    DOB: Feb 23, 1931, 78 y.o.   MRN: [id number]  HPI  Review of Systems    Physical Exam     Subjective:    Patient ID: Wendy Kerr, female    DOB: 1930-04-05, 78 y.o.   MRN: [id number]  HPI 78 y/o BF here for a follow up visit... she has multiple medical problems including HBP;  Hyperchol & DM;  Obesity;  Divertics;  DJD;  & Anemia w/ MGUS... her granddau is a Optometrist in [employer] manager residency)... She worked at [employer]	[place] for [employer]...  ~  January 29, 2011:  65mo ROV & she notes a URI/ "head cold" starting yest w/ cough & whitish sput production; denies f/c/s, SOB, CP, etc... We decided to check CXR (neg- no infiltrate) & treat w/ ZPak & Hycodan... otherw doing well> BP remains controlled on her ; FLP looks good on Simva40; DM control excellent on Metform/Actos; unfortunately wt is not coming down & we reviewed diet + [facility]; athritis discomfort controlled w/ Etololac & Tylenol; known mild anemia & MGUS that is stable but Fe is diminished despite Fe+VitC daily (rec to incr to Bid & may need Iron IV if not improving)...  ~  August 02, 2011:  65mo ROV & Wendy Kerr is finally retiring from [employer]	[place] after 53 yrs on the job!  She is planning on joining the Y & silver sneakers to aid in wt reduction; her CC is arthritis pain (esp hands) but she has LODINE for prn use & encouraged to take it & try hot soaks...      We reviewed prob list, meds, xrays and labs> see below>> LABS 6/13:  FLP- at goals on Simva40;  Chems- ok w/ BS=103 A1c=6.0;  CBC- anemic w/ Hg=9.1 Fe=55 (13%);  TSH=1.86;  VitD=43;  SPE/IEP- pending   ~  January 29, 2012:  65mo ROV & Wendy Kerr is doing satis- no new complaints or concerns... We reviewed the following medical problems during today's office visit >>      AR, Hx bronchitis> on allergy shots and Patanase per DrVanWinkle...    HBP> on ASA81, Labet200Bid, Cardizem240, LosarHCT100-25;   BP=164/82 but she denies CP, palpit, dizzy, SOB, edema, etc;  we decided to incr Labet200-2Bid...    DM> on Metform500bid, & still on Actos15;  BS=138, A1c=6.5;  rec to continue same, improve diet, get wt down...    Chol> on Simva40;  Last FLP 6/13 showed TChol146, TG 74, HDL 72, LDL 60;  rec to continue same...    Overwt> wt= 203# unchanged & BMI= 35;  We reviewed diet, exercise, wt reduction strategies...    GI- Divertics, polyp> last colon 11/10 by DrJacobs w/ divertics & one sm adenoma removed (there is a pos FamHx colon ca)...    Hx benign pancreatic neoplasm> s/p surg at [facility] for pancreatic lesion= benign serous cystadenoma & they continue to check her periodically...    DJD, VitD defic> on Etodolac400bid prn & VitD 50K/wk; follwed by DrSDean w/ shots in her knees & wrist splints for CTS; last Vit D level 6/13 = 43...    Anemia, MGUS> on FeSO4 daily; Labs show Hg= 10.5, Fe=56 (15%sat); last SPE 6/13 showed stable IgG kappa paraprot...  We reviewed prob list, meds, xrays and labs> see below for updates >> she had Flu shot 10/13           Problem List:  ALLERGIC RHINITIS>  She is still on weekly allergy shots per DrESL/ VanWinkle... ~  Seen 8/13 by DrVanWinkle- AR and sinusitis, treated w/ Allegra, Nasonex, Patanase, Saline, & given Depo shot & Amox...  Hx of BRONCHITIS, RECURRENT (ICD-491.9) ~  12/12:  Presents w/ URI, bronchitic exac treated w/ ZPak & Hycodan... ~  CXR 12/12 showed normal heart size, prominent right paratrach region (vasc structures), clear lungs...  HYPERTENSION (ICD-401.9) - on ASA 81mg /d, LABETOLOL 200mg Bid, CARDIZEM CD 240mg /d, HYZAAR 100-25 daily...  ~  12/12: BP= 140/82 and denies HA, fatigue, visual changes, CP, palipit, dizziness, syncope, dyspnea, edema, etc... ~  6/13:  BP= 144/80 & she remains asymptomatic... ~  12/13:  on ASA81, Labet200Bid, Cardizem240, LosarHCT100-25;  BP=164/82 but she denies CP, palpit, dizzy, SOB, edema, etc;  we decided to incr  Labet200-2Bid.  DIABETES MELLITUS (ICD-250.00) - on METFORMIN 500mg Bid, ACTOS 15mg /d... she has a difficult time w/ diet & exercise esp due to her knee arthritis & we discussed swimming etc...Marland KitchenMarland Kitchen. BS at home betw 110-140 she says... ~  labs 6/08 showed BS= 124, HgA1c= 6.3 ~  labs 6/09 showed BS= 124, HgA1c= 6.4.Marland KitchenMarland Kitchen rec- same meds, better diet! ~  labs 12/09 showed BS= 126, A1c= 6.1 ~  labs 10/10 showed BS= 122, A1c= 6.1 ~  labs 3/11 showed BS= 120, A1c= 6.3 ~  labs 12/11 showed BS= 103, A1c= 6.1 ~  Labs 6/12 showed BS= 108, A1c not done... ~  Labs 12/12 showed BS= 121, A1c= 6.3 ~  Labs 6/13 on Metform500Bid+Actos15 showed BS=103, A1c=6.0; rec to STOP Actos (but she never stopped this med)... ~  Labs 12/13 on Metform500bid, & still on Actos15;  BS=138, A1c=6.5;  rec to continue same, improve diet, get wt down...  HYPERCHOLESTEROLEMIA (ICD-272.0) - now on SIMVASTATIN 40mg /d (prev Vytorin, then Simva80). ~  FLP 6/08 showed TChol 141, TG 80, HDL 66, LDL 59... LFT's were normal. ~  FLP 6/09 on Vytorin10/40 showed TChol 223, TG 105, HDL 56, LDL 143... rec- incr to 10/80... ~  FLP 12/09 on Vytorin 10/80 showed TChol 159, TG 62, HDL 89, LDL 58... ch to Simva80 for $$ ~  FLP 10/10 on Simva80 showed TChol 187, TG 78, HDL 64, LDL 107 ~  Simvastatin was decreased to 40mg /d based on the new guidelines... ~  FLP 12/11 on Simva40 showed TChol 170, TG 62, HDL 68, LDL 90 ~  FLP 6/12 on Simva40 showed TChol 170, TG 86, HDL 68, LDL 85 ~  FLP 12/12 on Simva40 showed TChol 176, TG 74, HDL 78, LDL 82 ~  FLP 6/13 on Simva40 showed TChol 146, TG 74, HDL 72, LDL 60  OBESITY (ICD-278.00) - we discussed diet + exercise required to lose weight... ~  weight 1/07 = 188# ~  weight 12/08 = 202# ~  weight 12/09 = 201# ~  weight 3/11 = 201# ~  weight 11/11 = 207# ~  Weight 6/12 = 202# ~  Weight 12/12 = 205# ~  Weight 6/13 = 204# ~  Weight 12/13 = 203#  DIVERTICULOSIS OF COLON (ICD-562.10) - there is a family hx of  colon cancer... ~  colonoscopy 2/03 by DrSam showed divertics only...  ~  f/u colonoscopy 11/10 by DrJacobs showed 1 sm polyp= tubular adenoma, & divertics...  Hx of BEN NEOPLASM PANCREAS EXCEPT ISLETS LANGERHANS (ICD-211.6) - she is s/p resection of a serous cystadenoma of the pancreas at [facility] by DrTyler in 2004... she has a routine f/u appt sched yearly> now every other yr.  DEGENERATIVE JOINT  DISEASE (ICD-715.90) - uses ETODOLAC 400mg Bid as needed... she recently saw DrDean w/ shots in her knees... ~  11/11:  she reports early CTS per DrDean & Rx w/ wrist splints & improved...  VITAMIN D DEFICIENCY (ICD-268.9) - Vit D level 6/09 was 20 & pt Rx w/ 50,000 u weekly... ~  labs 12/09 showed Vit D level = 30... she chose to continue the 50K weekly. ~  labs 3/11 showed Vit D level = 28... ?taking 50K each week?- continue. ~  Labs 6/12 showed Vit D = 40... rec to continue 50K ewach week... ~  Labs 6/13 showed Vit D level = 43  Hx of COMMON MIGRAINE (ICD-346.10) -prev eval by DrAdelman in the past...  ANXIETY DISORDER, GENERALIZED (ICD-300.02)  ANEMIA NOS (ICD-285.9) - on FeSO4 + Vit C daily... ~  labs 6/09 showed Hg~ 10, Hct= 30, MCV= 78, Fe= 70... ~  labs 12/09 showed Hg= 10.4, MCV= 79... ~  labs 3/11 showed Hg= not done... ~  labs 11/11 showed Hg= 9.5, MVC= 80, Fe= 61 (12%sat) ~  Labs 6/12 showed Hg= 10.0, MCV= 81 ~  Labs 12/12 showed Hg= 9.8, MCV= 80, Fe= 34 (7%sat) on Fe+VitC daily; followed for GI by DrJacobs w/ colon 11/10 (1 sm polyp removed); rec to incr Fe/VitC to Bid & may need IV Fe if not responding. ~  Labs 6/13 on Fe one daily showed Hg= 9.1, MCV= 81, Fe= 55 (13%sat); rec incr Fe to Bid dosing... ~  Labs 12/13 on Fe daily showed Hg= 10.5, Fe=56 (15%sat); last SPE 6/13 showed stable IgG kappa paraprot...  MONOCLONAL GAMMOPATHY (ICD-273.1) - MGUS diagnosed w/ prev anemia work up... she has a monoclonal IgG Kappa paraprotein identified... we have been following SPE/ IEP yearly  without any progression...  ~  labs 6/08 showed monoclonal IgG kappa protein- 0.37g/dl & quant Ig's showed IgG level = 978 mg/dl. ~  labs 4/09 showed monoclonal IgG kappa protein- 0.41 g/dl & quant Ig's showed IgG level = 1260 mg/dl. ~  labs 8/11 showed monoclonal IgG kappa protein- 0.37g/dl & quant Ig's showed IgG level = 1190 mg/dl. ~  Labs 9/14 showed monoclonal IgG kappa protein- 0.41g/dl & quant Ig's showed IgG level = 1190 mg/dl ~  Labs 7/82 showed monoclonal IgG kappa protein- 0.39 g/dl & quant Ig's showed IgG level = 1110mg /dl   Past Surgical History  Procedure Date  . Resection serous cystadenoma of pancreas 2004    at [facility]  . Abdominal hysterectomy     Outpatient Encounter Prescriptions as of 01/29/2012  Medication Sig Dispense Refill  . aspirin 81 MG tablet Take 81 mg by mouth daily.        Marland Kitchen diltiazem (CARDIZEM CD) 240 MG 24 hr capsule Take 1 capsule (240 mg total) by mouth daily.  30 capsule  11  . ergocalciferol (VITAMIN D2) 50000 UNITS capsule Take 1 capsule (50,000 Units total) by mouth once a week.  4 capsule  11  . etodolac (LODINE) 400 MG tablet Take 1 tablet (400 mg total) by mouth daily. As needed for arthritis pain  30 tablet  11  . ferrous sulfate 324 (65 FE) MG TBEC Take 1 tablet (325 mg total) by mouth 2 (two) times daily. WITH 500 MG VITAMIN C  60 tablet  3  . labetalol (NORMODYNE) 200 MG tablet TAKE 1 TABLET (200 MG TOTAL) BY MOUTH 2 (TWO) TIMES DAILY.  60 tablet  5  . losartan-hydrochlorothiazide (HYZAAR) 100-25 MG per tablet Take 1 tablet by  mouth daily.  30 tablet  11  . metFORMIN (GLUCOPHAGE) 500 MG tablet TAKE 1 TABLET BY MOUTH TWICE A DAY  60 tablet  11  . pioglitazone (ACTOS) 15 MG tablet Take 1 tablet (15 mg total) by mouth daily.  30 tablet  11  . simvastatin (ZOCOR) 40 MG tablet Take 1 tablet (40 mg total) by mouth at bedtime.  30 tablet  11    Allergies  Allergen Reactions  . Piroxicam     REACTION: HEADACHE    Current Medications, Allergies,  Past Medical History, Past Surgical History, Family History, and Social History were reviewed in [employer] record.    Review of Systems        See HPI - all other systems neg except as noted... The patient complains of dyspnea on exertion.  The patient denies anorexia, fever, weight loss, weight gain, vision loss, decreased hearing, hoarseness, chest pain, syncope, peripheral edema, prolonged cough, headaches, hemoptysis, abdominal pain, melena, hematochezia, severe indigestion/heartburn, hematuria, incontinence, muscle weakness, suspicious skin lesions, transient blindness, difficulty walking, depression, unusual weight change, abnormal bleeding, enlarged lymph nodes, and angioedema.     Objective:   Physical Exam     WD, Overweight,  78 y/o BF in NAD... GENERAL:  Alert & oriented; pleasant & cooperative... HEENT:  [place]/AT, EOM-wnl, PERRLA, EACs-clear, TMs-wnl, NOSE-clear, THROAT-clear & wnl. NECK:  Supple w/ fairROM; no JVD; normal carotid impulses w/o bruits; no thyromegaly or nodules palpated; no lymphadenopathy. CHEST:  Clear to P & A; without wheezes/ rales/ or rhonchi. HEART:  Regular Rhythm; without murmurs/ rubs/ or gallops. ABDOMEN:  Soft & nontender; scar of prev surg; normal bowel sounds; no organomegaly or masses detected. EXT: without deformities, mod arthritic changes; no varicose veins/ +venous insuffic/ no edema. NEURO:  CN's intact;  no focal neuro deficits... DERM:  No lesions noted; no rash etc...  RADIOLOGY DATA:  Reviewed in the EPIC EMR & discussed w/ the patient...  LABORATORY DATA:  Reviewed in the EPIC EMR & discussed w/ the patient...   Assessment & Plan:    AR/ Bronchitis>  Stable & breathing well; prev URI treated w/ ZPak & Hycodan prn...  HBP>  BP sl elev & we decided to incr Labet 200mg  to 2Bid; get on diet & get wt down!  DM>  Control is tight on Metform + Actos; she did not prev stop the Actos 7 ok to continue both meds; needs  to get wt down!  CHOL>  Stable on Simva40; same thing applies to diet efforts!  OBESITY>  We again reviewed diet + exercise prescriptions...  GI> Divertics/ Polyps>  Followed by DrJacobs and up to date w/ colon 11/10 & 1 sm polyp removed...  Hx Pancreatic serous cystadenoma removed at [facility] 2004> stable & doing well; she follows up at [facility] every other yr.  DJD>  On Etodolac & followed by DrDean...  Vit D defic>  Remains on Vit D 50K weekly...  Anemia & MGUS>  Persist anemia (Hg=9.1 now) & Fe is borderline (55- 13%sat) on daily supplement> rec to incr to Bid & may need IV Fe; no progression of the MGUS w/ IgGkappa prot & 0.39gm/dL of 1.61WR/UE total (all parameters about the same as last yr)...   Patient's Medications  New Prescriptions   No medications on file  Previous Medications   ASPIRIN 81 MG TABLET    Take 81 mg by mouth daily.     DILTIAZEM (CARDIZEM CD) 240 MG 24 HR CAPSULE  Take 1 capsule (240 mg total) by mouth daily.   ERGOCALCIFEROL (VITAMIN D2) 50000 UNITS CAPSULE    Take 1 capsule (50,000 Units total) by mouth once a week.   ETODOLAC (LODINE) 400 MG TABLET    Take 1 tablet (400 mg total) by mouth daily. As needed for arthritis pain   FERROUS SULFATE 324 (65 FE) MG TBEC    Take 1 tablet (325 mg total) by mouth 2 (two) times daily. WITH 500 MG VITAMIN C   LABETALOL (NORMODYNE) 200 MG TABLET    TAKE 1 TABLET (200 MG TOTAL) BY MOUTH 2 (TWO) TIMES DAILY.   LOSARTAN-HYDROCHLOROTHIAZIDE (HYZAAR) 100-25 MG PER TABLET    Take 1 tablet by mouth daily.   METFORMIN (GLUCOPHAGE) 500 MG TABLET    TAKE 1 TABLET BY MOUTH TWICE A DAY   PIOGLITAZONE (ACTOS) 15 MG TABLET    Take 1 tablet (15 mg total) by mouth daily.   SIMVASTATIN (ZOCOR) 40 MG TABLET    Take 1 tablet (40 mg total) by mouth at bedtime.  Modified Medications   No medications on file  Discontinued Medications   No medications on file

## 2012-01-31 ENCOUNTER — Other Ambulatory Visit: Payer: Self-pay | Admitting: *Deleted

## 2012-02-09 ENCOUNTER — Other Ambulatory Visit: Payer: Self-pay | Admitting: Pulmonary Disease

## 2012-02-12 ENCOUNTER — Other Ambulatory Visit: Payer: Self-pay | Admitting: Pulmonary Disease

## 2012-02-18 ENCOUNTER — Telehealth: Payer: Self-pay | Admitting: Pulmonary Disease

## 2012-02-18 MED ORDER — HYDROCODONE-HOMATROPINE 5-1.5 MG/5ML PO SYRP: 5.0000 mL | ORAL_SOLUTION | Freq: Four times a day (QID) | ORAL | Status: DC | PRN

## 2012-02-18 NOTE — Telephone Encounter (Signed)
Called and spoke with pt She states having nasal congestion and dry cough x 3 days Has not tried any meds otc b/c she prefers SN's recs Please advise, thanks Last ov 01/29/12 Next ov 07/30/12 Allergies  Allergen Reactions  . Piroxicam     REACTION: HEADACHE

## 2012-02-18 NOTE — Telephone Encounter (Signed)
Pt aware and rx called in

## 2012-02-18 NOTE — Telephone Encounter (Signed)
Per SN---ok to call in hycodan #6oz  1 tsp po every 4 hours prn for cough and refill x 2.  thanks

## 2012-03-13 ENCOUNTER — Encounter: Payer: Self-pay | Admitting: Pulmonary Disease

## 2012-04-19 IMAGING — CR DG WRIST COMPLETE 3+V*R*
4 series · 4 of 4 positions shown · non-contrast
Comparison: None.

CLINICAL DATA: Wrist and hand pain

RIGHT WRIST - COMPLETE 3+ VIEW

[view not recorded (1 of 4)]
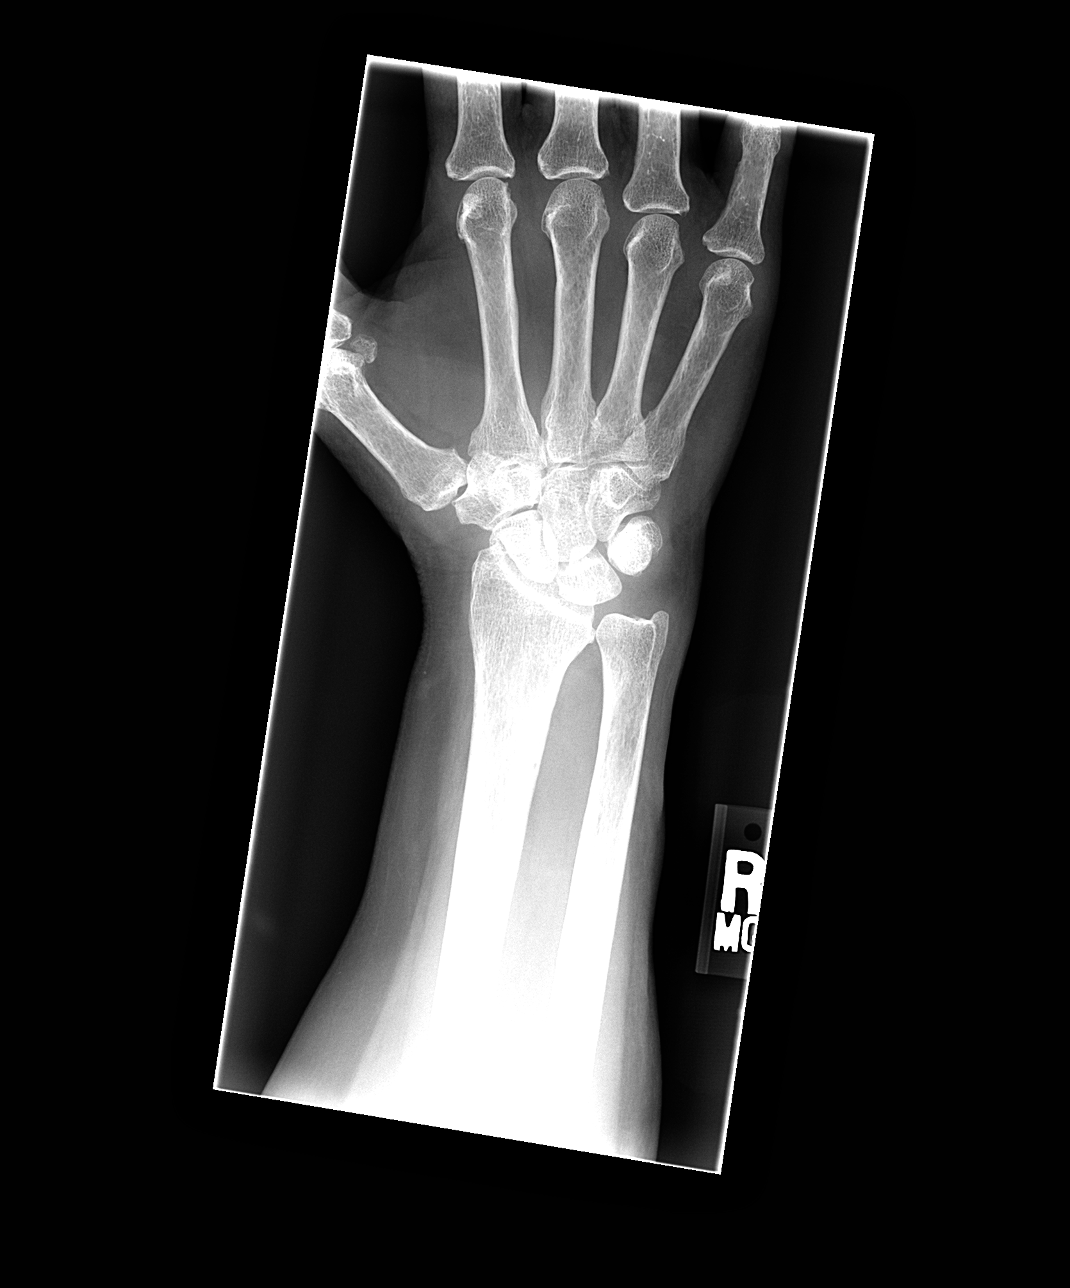

[view not recorded (2 of 4)]
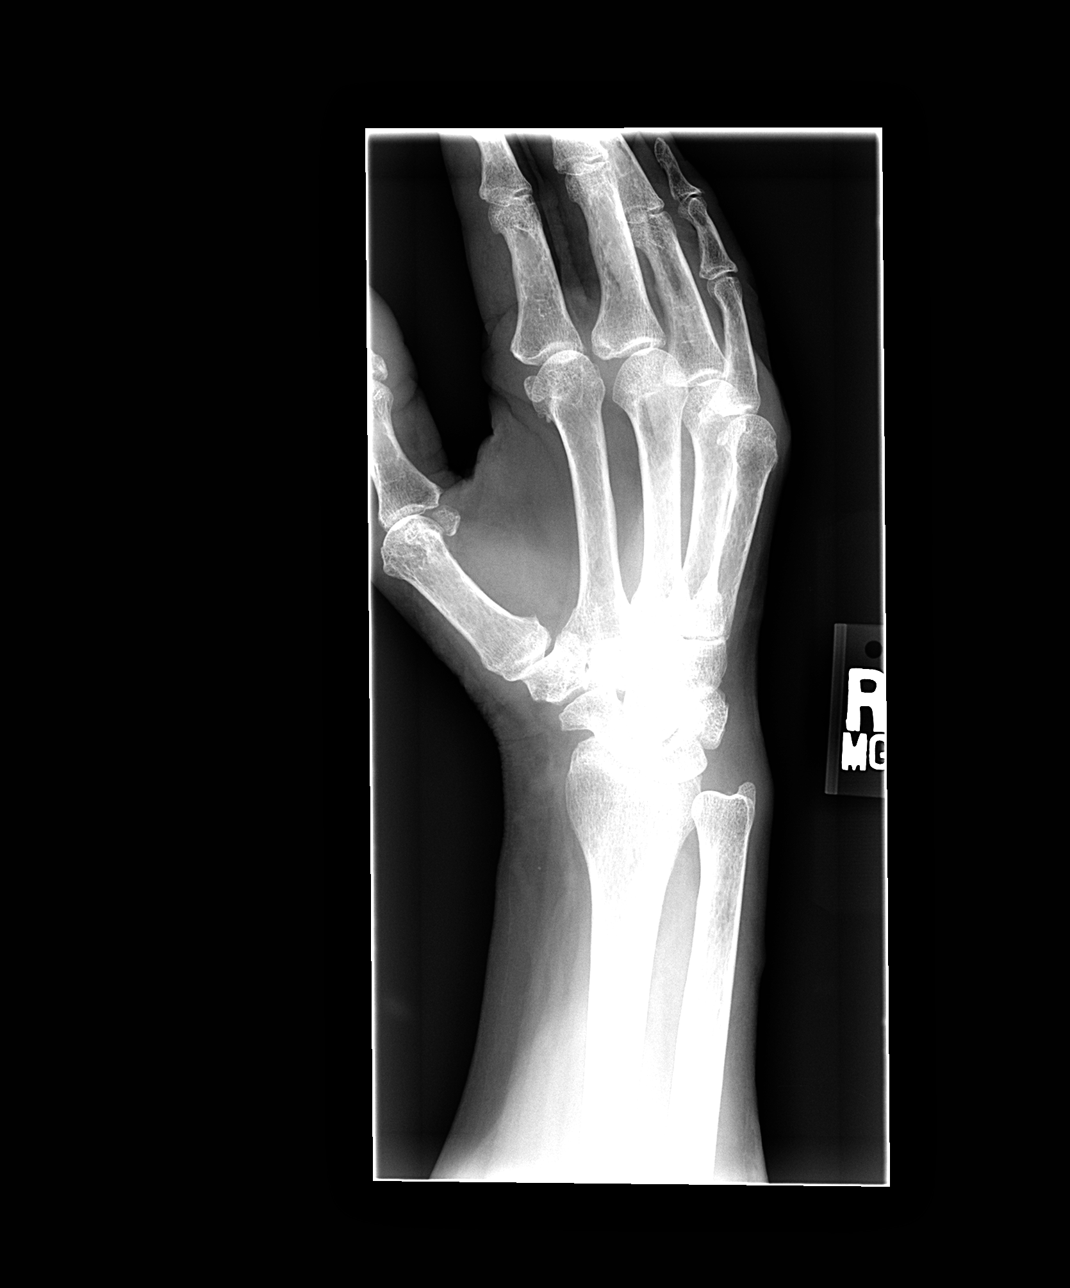

[view not recorded (3 of 4)]
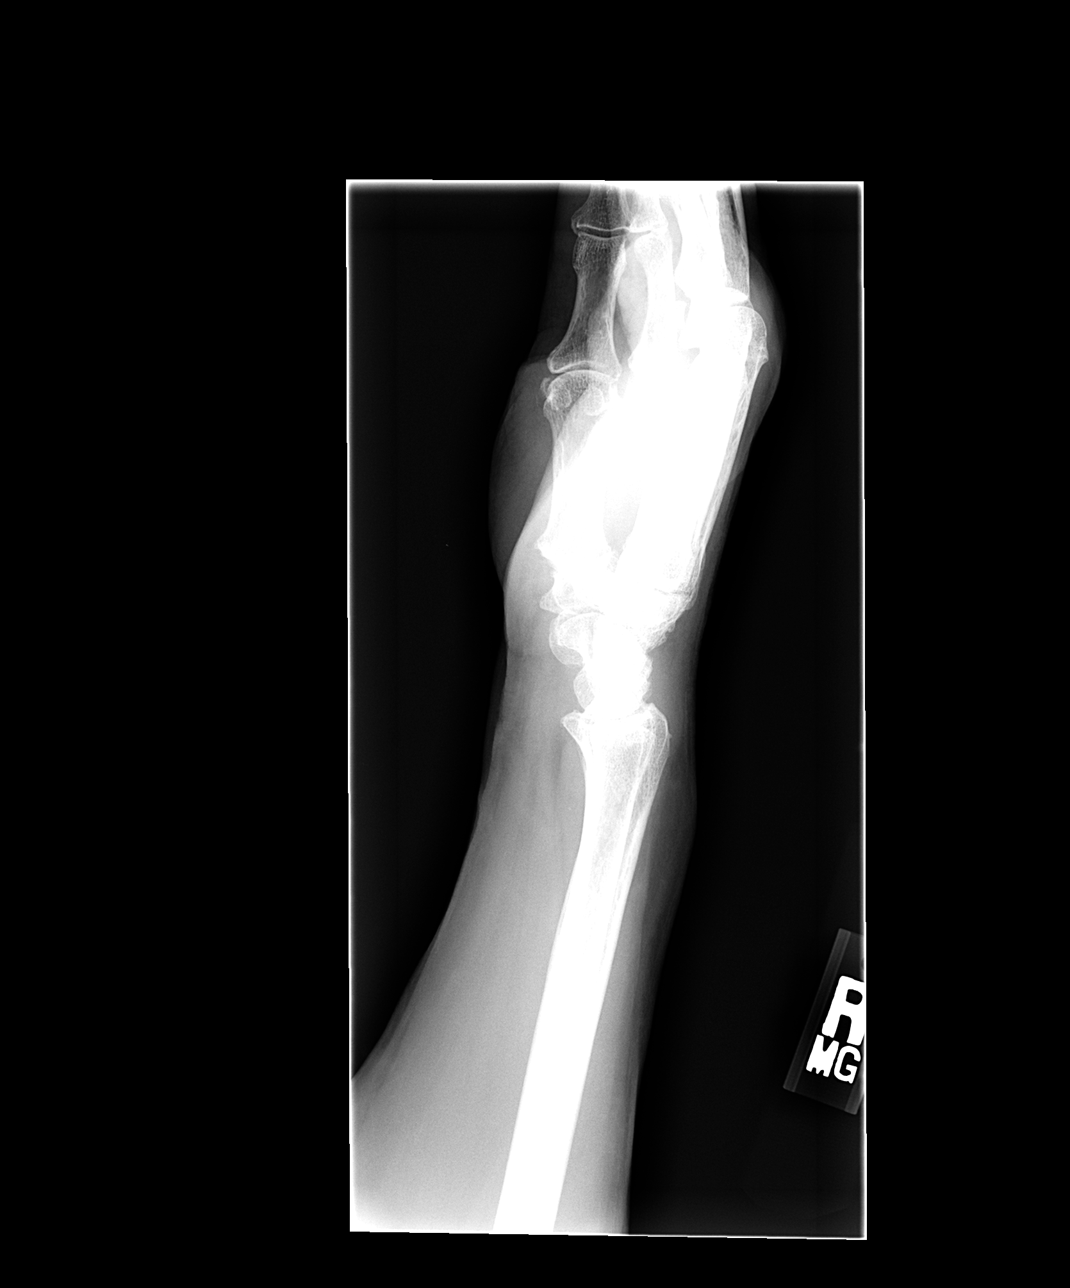

[view not recorded (4 of 4)]
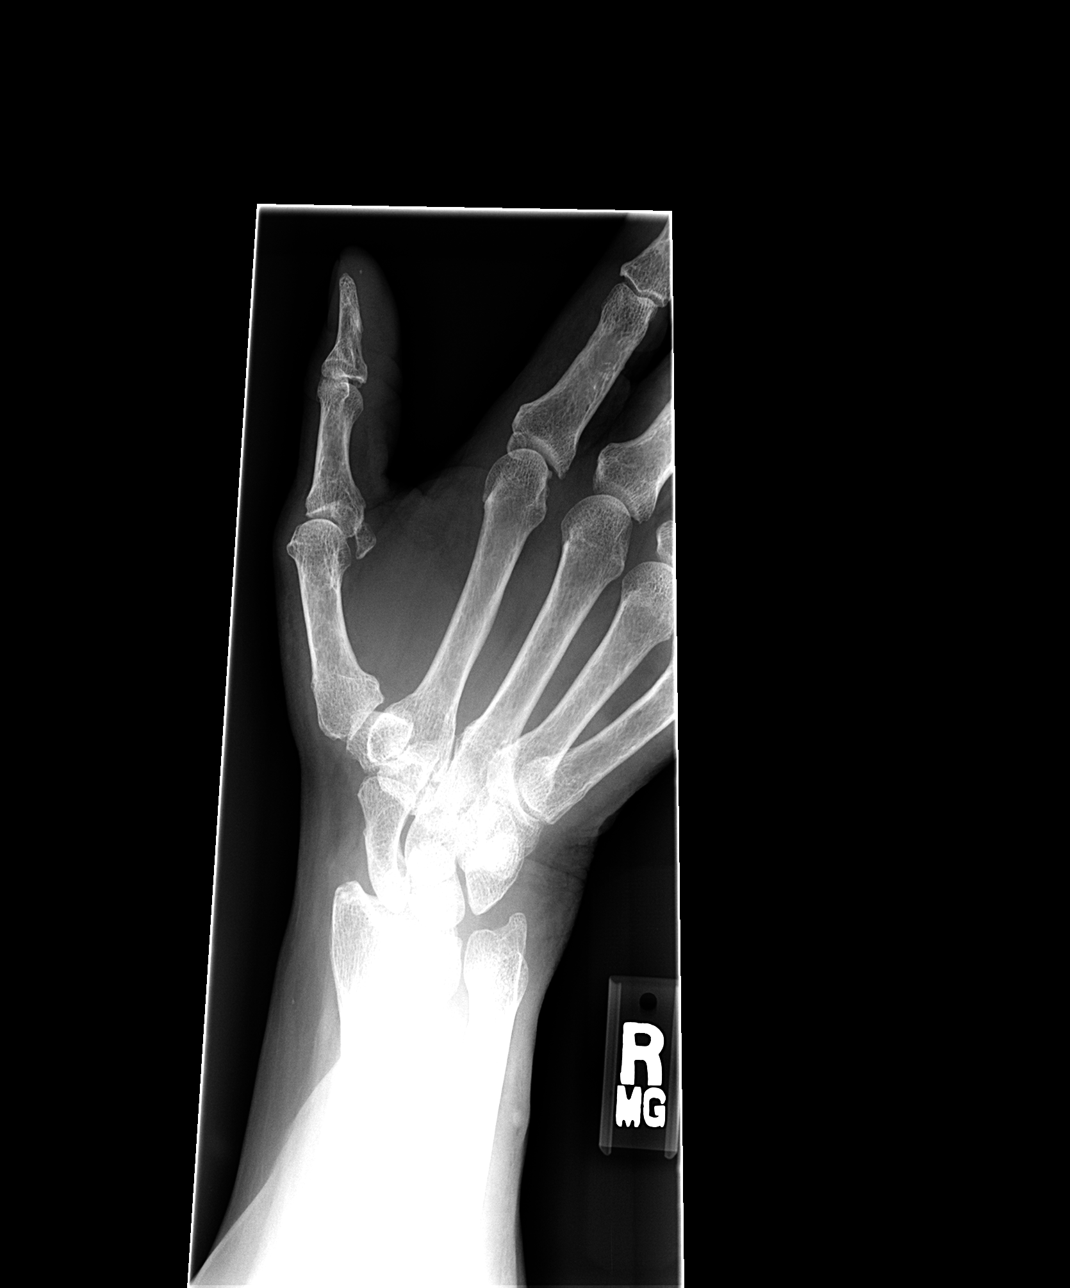

[4 of 4 positions shown; findings below may reference images not displayed]

FINDINGS: The right radiocarpal joint space appears normal and the
ulnar styloid is intact.  The carpal bones are in normal position.
Only mild degenerative changes present at the articulation of the
base of the first metacarpal carpal.
IMPRESSION: No significant abnormality.  Minimal degenerative change.

## 2012-04-19 IMAGING — CR DG WRIST COMPLETE 3+V*L*
4 series · 4 of 4 positions shown · non-contrast
Comparison: None.

CLINICAL DATA: Wrist and hand pain

LEFT WRIST - COMPLETE 3+ VIEW

[view not recorded (1 of 4)]
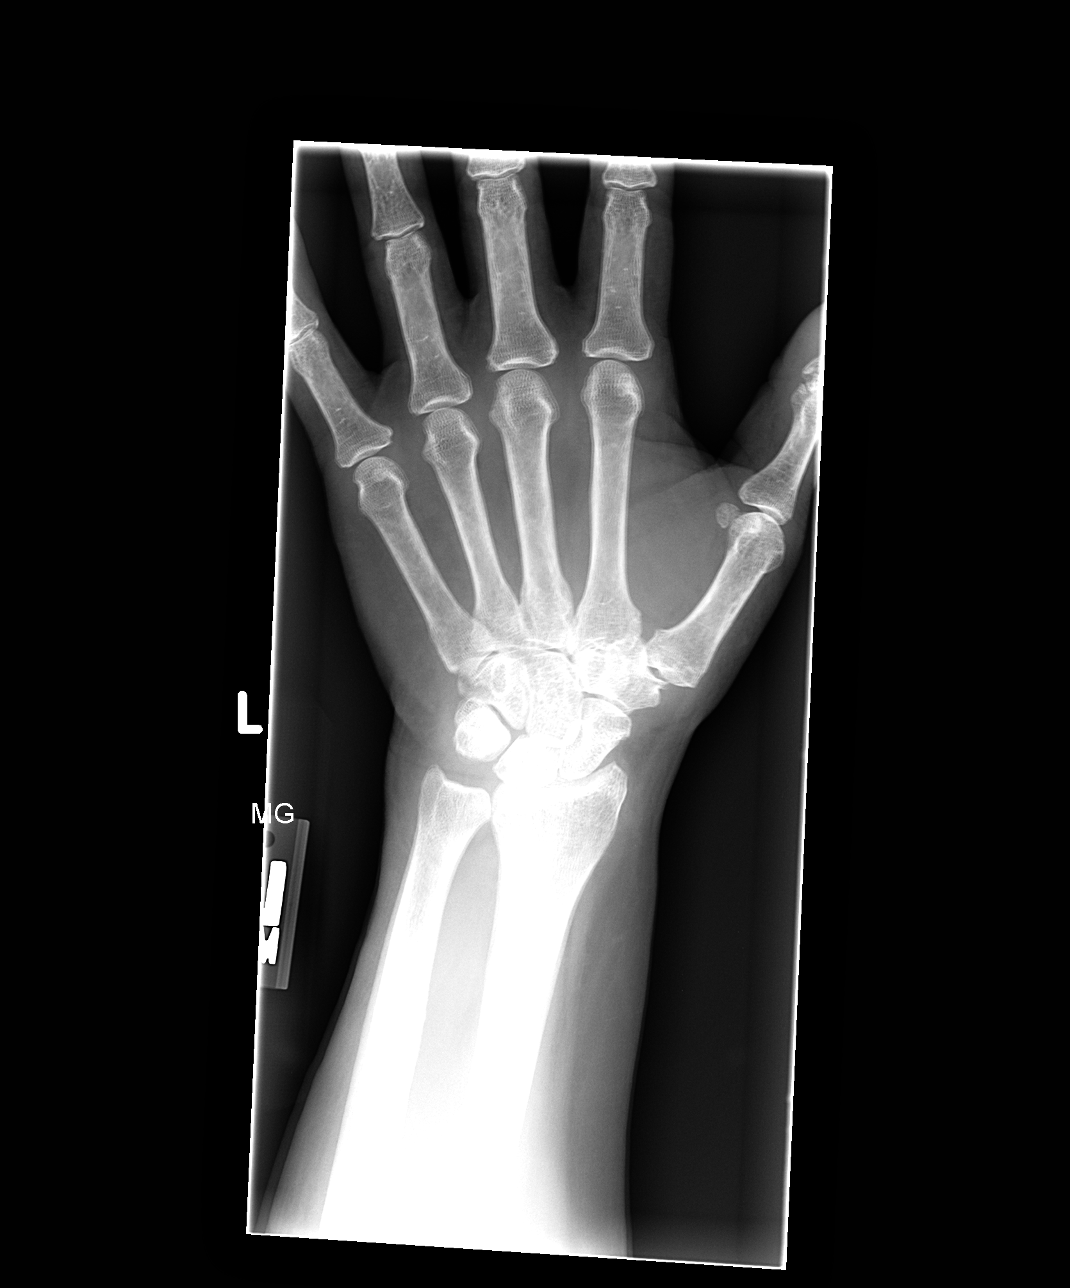

[view not recorded (2 of 4)]
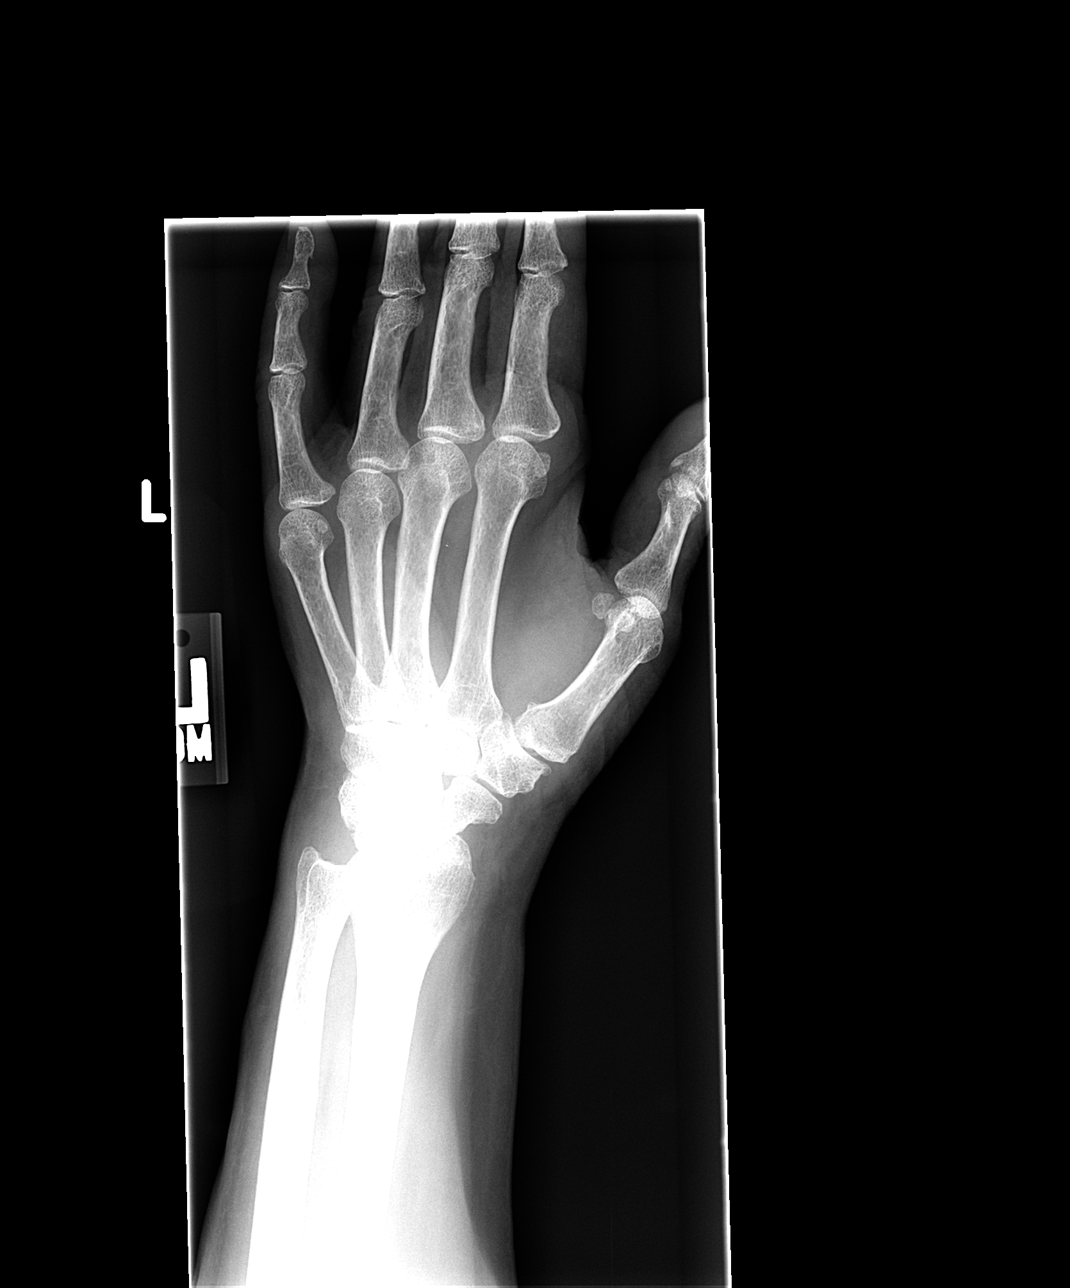

[view not recorded (3 of 4)]
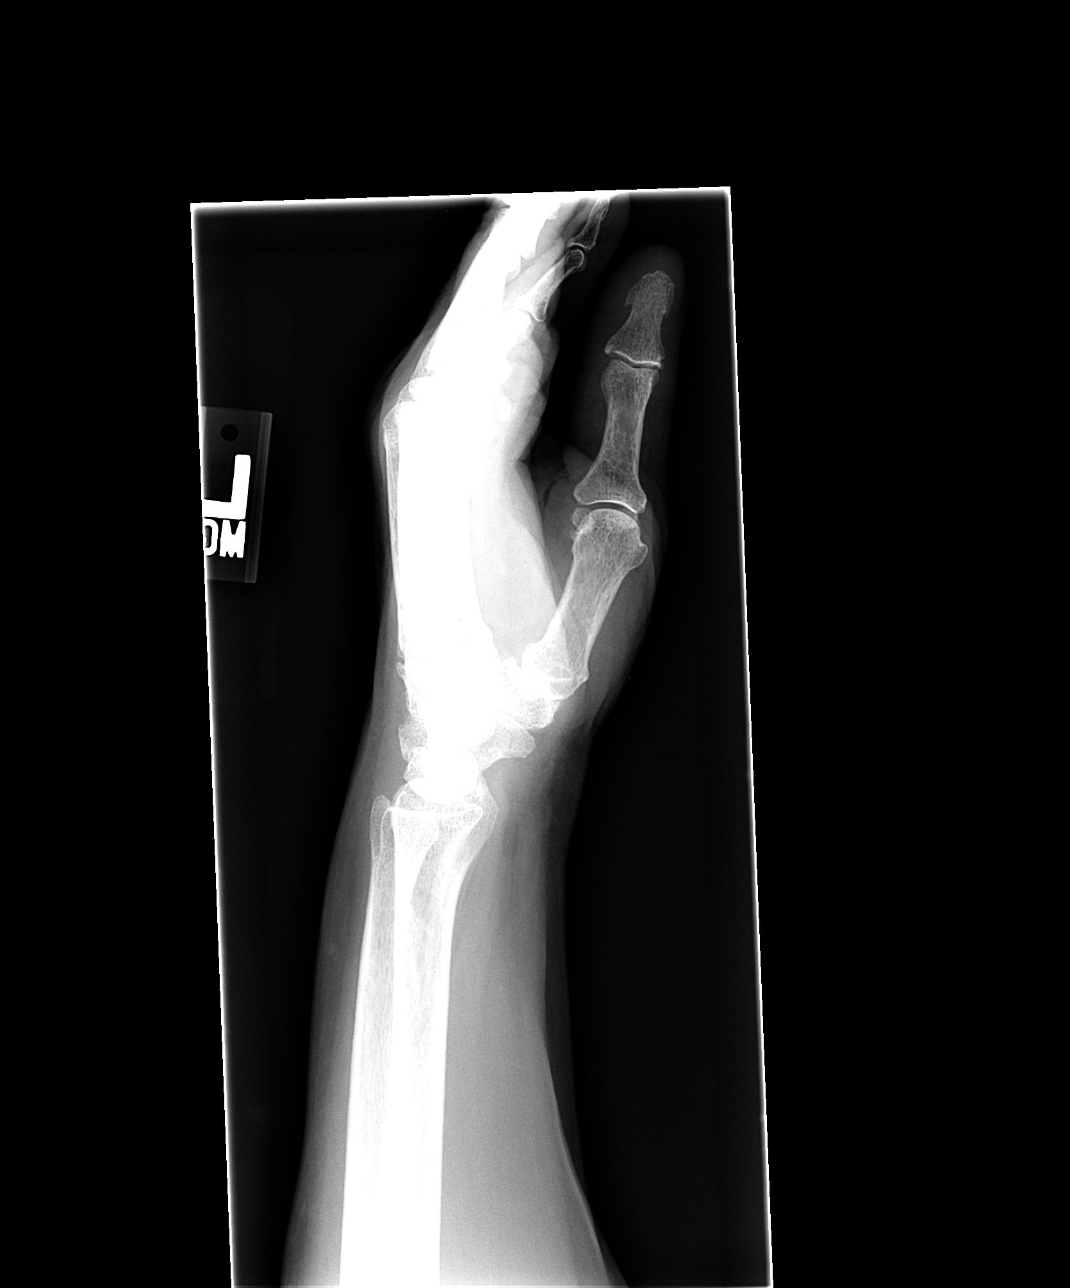

[view not recorded (4 of 4)]
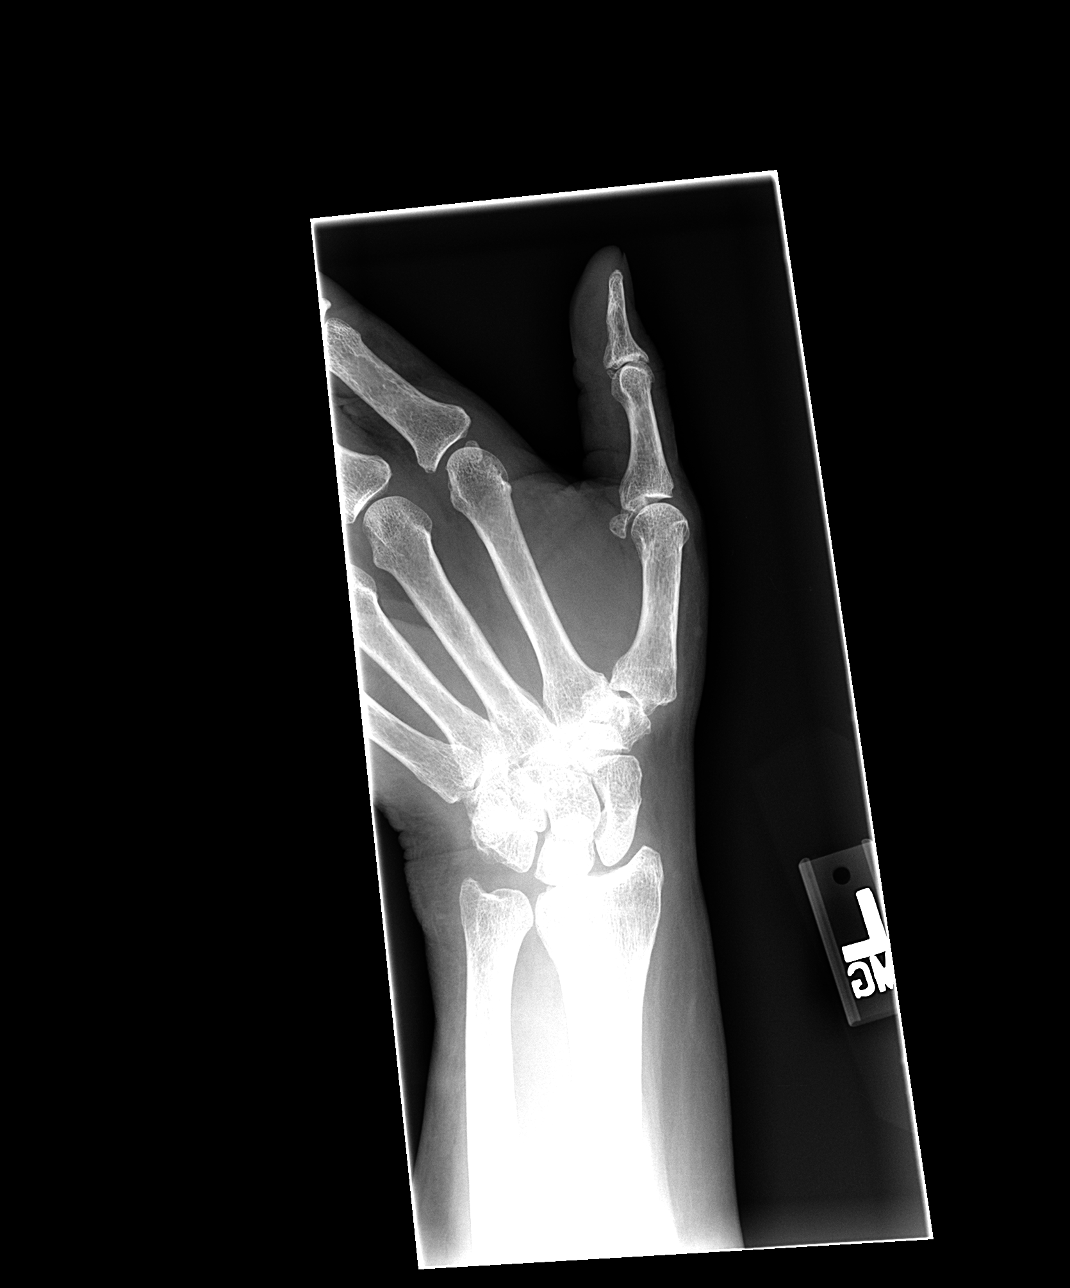

[4 of 4 positions shown; findings below may reference images not displayed]

FINDINGS: The left radiocarpal joint space appears normal and the
ulnar styloid is intact.  The carpal bones are in normal position.
Mild degenerative change is present at the left first metacarpal -
carpal articulation.
IMPRESSION: Mild degenerative change.  No acute abnormality.

## 2012-05-19 ENCOUNTER — Other Ambulatory Visit: Payer: Self-pay | Admitting: Pulmonary Disease

## 2012-06-05 ENCOUNTER — Other Ambulatory Visit: Payer: Self-pay | Admitting: Pulmonary Disease

## 2012-07-30 ENCOUNTER — Other Ambulatory Visit (INDEPENDENT_AMBULATORY_CARE_PROVIDER_SITE_OTHER): Payer: Medicare Other

## 2012-07-30 ENCOUNTER — Encounter: Payer: Self-pay | Admitting: Pulmonary Disease

## 2012-07-30 ENCOUNTER — Ambulatory Visit (INDEPENDENT_AMBULATORY_CARE_PROVIDER_SITE_OTHER): Payer: Medicare Other | Admitting: Pulmonary Disease

## 2012-07-30 VITALS — BP 140/78 | HR 75 | Temp 98.6°F | Ht 62.5 in | Wt 201.0 lb

## 2012-07-30 DIAGNOSIS — D136 Benign neoplasm of pancreas: Secondary | ICD-10-CM

## 2012-07-30 DIAGNOSIS — E119 Type 2 diabetes mellitus without complications: Secondary | ICD-10-CM

## 2012-07-30 DIAGNOSIS — F411 Generalized anxiety disorder: Secondary | ICD-10-CM

## 2012-07-30 DIAGNOSIS — K5730 Diverticulosis of large intestine without perforation or abscess without bleeding: Secondary | ICD-10-CM

## 2012-07-30 DIAGNOSIS — D472 Monoclonal gammopathy: Secondary | ICD-10-CM

## 2012-07-30 DIAGNOSIS — E7800 Pure hypercholesterolemia, unspecified: Secondary | ICD-10-CM

## 2012-07-30 DIAGNOSIS — I10 Essential (primary) hypertension: Secondary | ICD-10-CM

## 2012-07-30 DIAGNOSIS — E559 Vitamin D deficiency, unspecified: Secondary | ICD-10-CM

## 2012-07-30 DIAGNOSIS — M1990 Unspecified osteoarthritis, unspecified site: Secondary | ICD-10-CM

## 2012-07-30 DIAGNOSIS — D649 Anemia, unspecified: Secondary | ICD-10-CM

## 2012-07-30 DIAGNOSIS — E669 Obesity, unspecified: Secondary | ICD-10-CM

## 2012-07-30 LAB — BASIC METABOLIC PANEL
CO2: 21 mEq/L (ref 19–32)
Chloride: 109 mEq/L (ref 96–112)
Creatinine, Ser: 1.4 mg/dL — ABNORMAL HIGH (ref 0.4–1.2)

## 2012-07-30 LAB — LIPID PANEL
Total CHOL/HDL Ratio: 3
Triglycerides: 119 mg/dL (ref 0.0–149.0)

## 2012-07-30 LAB — CBC WITH DIFFERENTIAL/PLATELET
Basophils Absolute: 0 10*3/uL (ref 0.0–0.1)
Eosinophils Absolute: 0.1 10*3/uL (ref 0.0–0.7)
Lymphocytes Relative: 16.7 % (ref 12.0–46.0)
MCHC: 32.7 g/dL (ref 30.0–36.0)
Neutrophils Relative %: 74 % (ref 43.0–77.0)
Platelets: 410 10*3/uL — ABNORMAL HIGH (ref 150.0–400.0)
RBC: 4.12 Mil/uL (ref 3.87–5.11)
RDW: 17.5 % — ABNORMAL HIGH (ref 11.5–14.6)

## 2012-07-30 LAB — HEPATIC FUNCTION PANEL
ALT: 14 U/L (ref 0–35)
AST: 15 U/L (ref 0–37)
Albumin: 3.7 g/dL (ref 3.5–5.2)
Alkaline Phosphatase: 83 U/L (ref 39–117)
Bilirubin, Direct: 0 mg/dL (ref 0.0–0.3)
Total Bilirubin: 0.4 mg/dL (ref 0.3–1.2)
Total Protein: 7.4 g/dL (ref 6.0–8.3)

## 2012-07-30 LAB — HEMOGLOBIN A1C: Hgb A1c MFr Bld: 6.3 % (ref 4.6–6.5)

## 2012-07-30 LAB — IBC PANEL
Saturation Ratios: 16.6 % — ABNORMAL LOW (ref 20.0–50.0)
Transferrin: 270.9 mg/dL (ref 212.0–360.0)

## 2012-07-30 NOTE — Progress Notes (Signed)
Subjective:    Patient ID: Wendy Kerr, female    DOB: [birth year], 79 y.o.   MRN: [id number]  HPI  Review of Systems  Physical Exam  Subjective:    Patient ID: Wendy Kerr, female    DOB: 15-Oct-1930, 79 y.o.   MRN: [id number]  HPI 79 y/o BF here for a follow up visit... she has multiple medical problems including HBP;  Hyperchol & DM;  Obesity;  Divertics;  DJD;  & Anemia w/ MGUS... her granddau is a Optometrist in [employer] Environmental manager residency)... She worked at [employer]	[place] for [employer]...  ~  January 29, 2011:  87mo ROV & she notes a URI/ "head cold" starting yest w/ cough & whitish sput production; denies f/c/s, SOB, CP, etc... We decided to check CXR (neg- no infiltrate) & treat w/ ZPak & Hycodan... otherw doing well> BP remains controlled on her ; FLP looks good on Simva40; DM control excellent on Metform/Actos; unfortunately wt is not coming down & we reviewed diet + [facility]; athritis discomfort controlled w/ Etololac & Tylenol; known mild anemia & MGUS that is stable but Fe is diminished despite Fe+VitC daily (rec to incr to Bid & may need Iron IV if not improving)...  ~  August 02, 2011:  87mo ROV & Wendy Kerr is finally retiring from [employer]	[place] after 53 yrs on the job!  She is planning on joining the Y & silver sneakers to aid in wt reduction; her CC is arthritis pain (esp hands) but she has LODINE for prn use & encouraged to take it & try hot soaks...      We reviewed prob list, meds, xrays and labs> see below>> LABS 6/13:  FLP- at goals on Simva40;  Chems- ok w/ BS=103 A1c=6.0;  CBC- anemic w/ Hg=9.1 Fe=55 (13%);  TSH=1.86;  VitD=43;  SPE/IEP- pending   ~  January 29, 2012:  87mo ROV & Wendy Kerr is doing satis- no new complaints or concerns... We reviewed the following medical problems during today's office visit >>     AR, Hx bronchitis> on allergy shots and Patanase per DrVanWinkle...    HBP> on ASA81, Labet200Bid, Cardizem240, LosarHCT100-25;  BP=164/82  but she denies CP, palpit, dizzy, SOB, edema, etc;  we decided to incr Labet200-2Bid...    DM> on Metform500bid, & still on Actos15;  BS=138, A1c=6.5;  rec to continue same, improve diet, get wt down...    Chol> on Simva40;  Last FLP 6/13 showed TChol146, TG 74, HDL 72, LDL 60;  rec to continue same...    Overwt> wt= 203# unchanged & BMI= 35;  We reviewed diet, exercise, wt reduction strategies...    GI- Divertics, polyp> last colon 11/10 by DrJacobs w/ divertics & one sm adenoma removed (there is a pos FamHx colon ca)...    Hx benign pancreatic neoplasm> s/p surg at [facility] for pancreatic lesion= benign serous cystadenoma & they continue to check her periodically...    DJD, VitD defic> on Etodolac400bid prn & VitD 50K/wk; follwed by DrSDean w/ shots in her knees & wrist splints for CTS; last Vit D level 6/13 = 43...    Anemia, MGUS> on FeSO4 daily; Labs show Hg= 10.5, Fe=56 (15%sat); last SPE 6/13 showed stable IgG kappa paraprot... We reviewed prob list, meds, xrays and labs> see below for updates >> she had Flu shot 10/13  ~  July 30, 2012:  87mo ROV & Wendy Kerr's CC is some left hip discomfort- she has Lodine for this but  hasn't taken- asked to try this & some warm soaks (if not responding- refer to Ortho)... We reviewed the following medical problems during today's office visit >>     AR, Hx bronchitis> on allergy shots and Patanase per DrVanWinkle; saw him 4/14 for Depo80, Allegra, Nasonex, Patanase refills...     HBP> on ASA81, Labet200-2Bid, Cardizem240, LosarHCT100-25;  BP=140/78 & she denies CP, palpit, dizzy, SOB, edema, etc...    DM> on Metform500bid;  BS=120, A1c=6.3;  rec to continue same, improve diet, get wt down...    Chol> on Simva40; FLP 6/14 shows TChol 185, TG 119, HDL 60, LDL 102;  rec to continue same, plus better diet...    Overwt> wt= 201# unchanged & BMI= 35;  We reviewed diet, exercise, wt reduction strategies...    GI- Divertics, polyp> last colon 11/10 by DrJacobs w/  divertics & one sm adenoma removed (there is a pos FamHx colon ca)...    Hx benign pancreatic neoplasm> s/p surg at [facility] for pancreatic lesion= benign serous cystadenoma & they continue to check her periodically...    DJD, VitD defic> on Etodolac400bid prn & VitD 50K/wk; follwed by DrSDean w/ shots in her knees & wrist splints for CTS; last Vit D level 6/13 = 43...    Anemia, MGUS> on FeSO4 daily; Labs show Hg= 10.5, Fe=63 (17%sat); SPE 6/14 shows stable IgG kappa paraprot... We reviewed prob list, meds, xrays and labs> see below for updates >>  LABS 6/14:  FLP- at goals on Simva40, LDL=102;  Chems- ok w/ BS=120 A1c=6.3 on MetformBid;  CBC- anemic w/ Hg=10.5, stable & Fe=63 stable on Iron supplement (continue same);  TSH=1.40;  SPE shows stable MGUS, no change...            Problem List:  ALLERGIC RHINITIS>  She is still on weekly allergy shots per DrESL/ VanWinkle... ~  Seen 8/13 by DrVanWinkle- AR and sinusitis, treated w/ Allegra, Nasonex, Patanase, Saline, & given Depo shot & Amox... ~  Seen 4/14 by DrVanWinkle- AR and wanted Depo shot, refilles Allegra, Nasonex, Patanase...  Hx of BRONCHITIS, RECURRENT (ICD-491.9) ~  12/12:  Presents w/ URI, bronchitic exac treated w/ ZPak & Hycodan... ~  CXR 12/12 showed normal heart size, prominent right paratrach region (vasc structures), clear lungs...  HYPERTENSION (ICD-401.9) >>  ~  on ASA 81mg /d, LABETOLOL 200mg Bid, CARDIZEM CD 240mg /d, HYZAAR 100-25 daily...  ~  12/12: BP= 140/82 and denies HA, fatigue, visual changes, CP, palipit, dizziness, syncope, dyspnea, edema, etc... ~  6/13:  BP= 144/80 & she remains asymptomatic... ~  12/13:  on ASA81, Labet200Bid, Cardizem240, LosarHCT100-25;  BP=164/82 but she denies CP, palpit, dizzy, SOB, edema, etc;  we decided to incr Labet200-2Bid. ~  6/14:  on ASA81, Labet200-2Bid, Cardizem240, LosarHCT100-25;  BP=140/78 & she denies CP, palpit, dizzy, SOB, edema, etc...  DIABETES MELLITUS (ICD-250.00) -  on METFORMIN 500mg Bid, ACTOS 15mg /d... she has a difficult time w/ diet & exercise esp due to her knee arthritis & we discussed swimming etc...Marland KitchenMarland Kitchen. BS at home betw 110-140 she says... ~  labs 6/08 showed BS= 124, HgA1c= 6.3 ~  labs 6/09 showed BS= 124, HgA1c= 6.4.Marland KitchenMarland Kitchen rec- same meds, better diet! ~  labs 12/09 showed BS= 126, A1c= 6.1 ~  labs 10/10 showed BS= 122, A1c= 6.1 ~  labs 3/11 showed BS= 120, A1c= 6.3 ~  labs 12/11 showed BS= 103, A1c= 6.1 ~  Labs 6/12 showed BS= 108, A1c not done... ~  Labs 12/12 showed BS= 121, A1c= 6.3 ~  Labs 6/13 on Metform500Bid+Actos15 showed BS=103, A1c=6.0; rec to STOP Actos (but she never stopped this med)... ~  Labs 12/13 on Metform500bid, & still on Actos15;  BS=138, A1c=6.5;  rec to continue same, improve diet, get wt down... ~  6/14:  on Metform500bid;  BS=120, A1c=6.3;  rec to continue same, improve diet, get wt down.  HYPERCHOLESTEROLEMIA (ICD-272.0) - now on SIMVASTATIN 40mg /d (prev Vytorin, then Simva80). ~  FLP 6/08 showed TChol 141, TG 80, HDL 66, LDL 59... LFT's were normal. ~  FLP 6/09 on Vytorin10/40 showed TChol 223, TG 105, HDL 56, LDL 143... rec- incr to 10/80... ~  FLP 12/09 on Vytorin 10/80 showed TChol 159, TG 62, HDL 89, LDL 58... ch to Simva80 for $$ ~  FLP 10/10 on Simva80 showed TChol 187, TG 78, HDL 64, LDL 107 ~  Simvastatin was decreased to 40mg /d based on the new guidelines... ~  FLP 12/11 on Simva40 showed TChol 170, TG 62, HDL 68, LDL 90 ~  FLP 6/12 on Simva40 showed TChol 170, TG 86, HDL 68, LDL 85 ~  FLP 12/12 on Simva40 showed TChol 176, TG 74, HDL 78, LDL 82 ~  FLP 6/13 on Simva40 showed TChol 146, TG 74, HDL 72, LDL 60 ~  FLP 6/14 on simva40 showed TChol 185, TG 119, HDL 60, LDL 102   OBESITY (ICD-278.00) - we discussed diet + exercise required to lose weight... ~  weight 1/07 = 188# ~  weight 12/08 = 202# ~  weight 12/09 = 201# ~  weight 3/11 = 201# ~  weight 11/11 = 207# ~  Weight 6/12 = 202# ~  Weight 12/12 =  205# ~  Weight 6/13 = 204# ~  Weight 12/13 = 203# ~  Weight 6/14 = 201#  DIVERTICULOSIS OF COLON (ICD-562.10) - there is a family hx of colon cancer... ~  colonoscopy 2/03 by DrSam showed divertics only...  ~  f/u colonoscopy 11/10 by DrJacobs showed 1 sm polyp= tubular adenoma, & divertics...  Hx of BEN NEOPLASM PANCREAS EXCEPT ISLETS LANGERHANS (ICD-211.6) - she is s/p resection of a serous cystadenoma of the pancreas at [facility] by DrTyler in 2004... she has a routine f/u appt sched yearly> now every other yr.  DEGENERATIVE JOINT DISEASE (ICD-715.90) - uses ETODOLAC 400mg Bid as needed... she recently saw DrDean w/ shots in her knees... ~  11/11:  she reports early CTS per DrDean & Rx w/ wrist splints & improved...  VITAMIN D DEFICIENCY (ICD-268.9) - Vit D level 6/09 was 20 & pt Rx w/ 50,000 u weekly... ~  labs 12/09 showed Vit D level = 30... she chose to continue the 50K weekly. ~  labs 3/11 showed Vit D level = 28... ?taking 50K each week?- continue. ~  Labs 6/12 showed Vit D = 40... rec to continue 50K ewach week... ~  Labs 6/13 showed Vit D level = 43  Hx of COMMON MIGRAINE (ICD-346.10) -prev eval by DrAdelman in the past...  ANXIETY DISORDER, GENERALIZED (ICD-300.02)  ANEMIA NOS (ICD-285.9) - on FeSO4 + Vit C daily... ~  labs 6/09 showed Hg~ 10, Hct= 30, MCV= 78, Fe= 70... ~  labs 12/09 showed Hg= 10.4, MCV= 79... ~  labs 3/11 showed Hg= not done... ~  labs 11/11 showed Hg= 9.5, MVC= 80, Fe= 61 (12%sat) ~  Labs 6/12 showed Hg= 10.0, MCV= 81 ~  Labs 12/12 showed Hg= 9.8, MCV= 80, Fe= 34 (7%sat) on Fe+VitC daily; followed for GI by DrJacobs w/ colon 11/10 (1  sm polyp removed); rec to incr Fe/VitC to Bid & may need IV Fe if not responding. ~  Labs 6/13 on Fe one daily showed Hg= 9.1, MCV= 81, Fe= 55 (13%sat); rec incr Fe to Bid dosing... ~  Labs 12/13 on Fe daily showed Hg= 10.5, Fe=56 (15%sat); last SPE 6/13 showed stable IgG kappa paraprot... ~  Labs 6/14 on Fe daily showed Hg=  10.5, Fe=63 (17%sat); SPE 6/14 shows stable IgG kappa paraprot...  MONOCLONAL GAMMOPATHY (ICD-273.1) - MGUS diagnosed w/ prev anemia work up... she has a monoclonal IgG Kappa paraprotein identified... we have been following SPE/ IEP yearly without any progression...  ~  labs 6/08 showed monoclonal IgG kappa protein- 0.37g/dl & quant Ig's showed IgG level = 978 mg/dl. ~  labs 2/95 showed monoclonal IgG kappa protein- 0.41 g/dl & quant Ig's showed IgG level = 1260 mg/dl. ~  labs 6/21 showed monoclonal IgG kappa protein- 0.37g/dl & quant Ig's showed IgG level = 1190 mg/dl. ~  Labs 3/08 showed monoclonal IgG kappa protein- 0.41g/dl & quant Ig's showed IgG level = 1190 mg/dl ~  Labs 6/57 showed monoclonal IgG kappa protein- 0.39 g/dl & quant Ig's showed IgG level = 1110mg /dl ~  Labs 8/46 showed monoclonal IgG kappa protein- 0.53gm/dl & quant Ig's showed IgG level = 1240mg /dl   Past Surgical History  Procedure Laterality Date  . Resection serous cystadenoma of pancreas  2004    at [facility]  . Abdominal hysterectomy      Outpatient Encounter Prescriptions as of 07/30/2012  Medication Sig Dispense Refill  . aspirin 81 MG tablet Take 81 mg by mouth daily.        Marland Kitchen diltiazem (CARDIZEM CD) 240 MG 24 hr capsule TAKE 1 CAPSULE (240 MG TOTAL) BY MOUTH DAILY.  30 capsule  11  . etodolac (LODINE) 400 MG tablet Take 1 tablet (400 mg total) by mouth daily. As needed for arthritis pain  30 tablet  11  . ferrous sulfate 324 (65 FE) MG TBEC TAKE 1 TABLET TWICE DAILY WITH 500 MG VITAMIN C  60 tablet  3  . HYDROcodone-homatropine (HYCODAN) 5-1.5 MG/5ML syrup Take 5 mLs by mouth every 6 (six) hours as needed for cough.  160 mL  2  . labetalol (NORMODYNE) 200 MG tablet Take 2 tablets by mouth two times daily  120 tablet  11  . labetalol (NORMODYNE) 200 MG tablet TAKE 1 TABLET (200 MG TOTAL) BY MOUTH 2 (TWO) TIMES DAILY.  60 tablet  11  . losartan-hydrochlorothiazide (HYZAAR) 100-25 MG per tablet TAKE 1 TABLET BY MOUTH  DAILY.  30 tablet  8  . metFORMIN (GLUCOPHAGE) 500 MG tablet TAKE 1 TABLET BY MOUTH TWICE A DAY  60 tablet  11  . metFORMIN (GLUCOPHAGE) 500 MG tablet TAKE 1 TABLET (500 MG TOTAL) BY MOUTH 2 (TWO) TIMES DAILY WITH A MEAL.  60 tablet  11  . simvastatin (ZOCOR) 40 MG tablet TAKE 1 TABLET (40 MG TOTAL) BY MOUTH AT BEDTIME.  30 tablet  11  . simvastatin (ZOCOR) 40 MG tablet TAKE 1 TABLET (40 MG TOTAL) BY MOUTH AT BEDTIME.  30 tablet  11  . simvastatin (ZOCOR) 40 MG tablet TAKE 1 TABLET (40 MG TOTAL) BY MOUTH AT BEDTIME.  30 tablet  11  . Vitamin D, Ergocalciferol, (DRISDOL) 50000 UNITS CAPS TAKE 1 CAPSULE (50,000 UNITS TOTAL) BY MOUTH ONCE A WEEK.  4 capsule  8   No facility-administered encounter medications on file as of 07/30/2012.  Allergies  Allergen Reactions  . Piroxicam     REACTION: HEADACHE    Current Medications, Allergies, Past Medical History, Past Surgical History, Family History, and Social History were reviewed in [employer] record.    Review of Systems        See HPI - all other systems neg except as noted... The patient complains of dyspnea on exertion.  The patient denies anorexia, fever, weight loss, weight gain, vision loss, decreased hearing, hoarseness, chest pain, syncope, peripheral edema, prolonged cough, headaches, hemoptysis, abdominal pain, melena, hematochezia, severe indigestion/heartburn, hematuria, incontinence, muscle weakness, suspicious skin lesions, transient blindness, difficulty walking, depression, unusual weight change, abnormal bleeding, enlarged lymph nodes, and angioedema.     Objective:   Physical Exam     WD, Overweight,  79 y/o BF in NAD... GENERAL:  Alert & oriented; pleasant & cooperative... HEENT:  [place]/AT, EOM-wnl, PERRLA, EACs-clear, TMs-wnl, NOSE-clear, THROAT-clear & wnl. NECK:  Supple w/ fairROM; no JVD; normal carotid impulses w/o bruits; no thyromegaly or nodules palpated; no lymphadenopathy. CHEST:  Clear to  P & A; without wheezes/ rales/ or rhonchi. HEART:  Regular Rhythm; without murmurs/ rubs/ or gallops. ABDOMEN:  Soft & nontender; scar of prev surg; normal bowel sounds; no organomegaly or masses detected. EXT: without deformities, mod arthritic changes; no varicose veins/ +venous insuffic/ no edema. NEURO:  CN's intact;  no focal neuro deficits... DERM:  No lesions noted; no rash etc...  RADIOLOGY DATA:  Reviewed in the EPIC EMR & discussed w/ the patient...  LABORATORY DATA:  Reviewed in the EPIC EMR & discussed w/ the patient...   Assessment & Plan:    AR/ Bronchitis>  Stable & breathing well; prev URI treated w/ ZPak & Hycodan prn...  HBP>  BP improved on incr Labet 200mg  to 2Bid; get on diet & get wt down!  DM>  Control is good on Metform monotherapy- continue same...  CHOL>  Stable on Simva40; same thing applies to diet efforts!  OBESITY>  We again reviewed diet + exercise prescriptions...  GI> Divertics/ Polyps>  Followed by DrJacobs and up to date w/ colon 11/10 & 1 sm polyp removed...  Hx Pancreatic serous cystadenoma removed at [facility] 2004> stable & doing well; she follows up at [facility] every other yr.  DJD>  On Etodolac & followed by DrDean...  Vit D defic>  Remains on Vit D 50K weekly...  Anemia & MGUS>  Persist anemia (Hg=10/5) & Fe is borderline (63- 17%sat) on daily supplement; no progression of the MGUS w/ IgG kappa prot & 0.53gm/dL of 1.19JY/NW total (all parameters about the same as last yr)...   Patient's Medications  New Prescriptions   No medications on file  Previous Medications   ASPIRIN 81 MG TABLET    Take 81 mg by mouth daily.     DILTIAZEM (CARDIZEM CD) 240 MG 24 HR CAPSULE    TAKE 1 CAPSULE (240 MG TOTAL) BY MOUTH DAILY.   ETODOLAC (LODINE) 400 MG TABLET    Take 1 tablet (400 mg total) by mouth daily. As needed for arthritis pain   FERROUS SULFATE 324 (65 FE) MG TBEC    TAKE 1 TABLET TWICE DAILY WITH 500 MG VITAMIN C   LABETALOL (NORMODYNE) 200 MG  TABLET    Take 2 tablets by mouth two times daily   LOSARTAN-HYDROCHLOROTHIAZIDE (HYZAAR) 100-25 MG PER TABLET    TAKE 1 TABLET BY MOUTH DAILY.   METFORMIN (GLUCOPHAGE) 500 MG TABLET    TAKE 1 TABLET  BY MOUTH TWICE A DAY   SIMVASTATIN (ZOCOR) 40 MG TABLET    TAKE 1 TABLET (40 MG TOTAL) BY MOUTH AT BEDTIME.   VITAMIN D, ERGOCALCIFEROL, (DRISDOL) 50000 UNITS CAPS    TAKE 1 CAPSULE (50,000 UNITS TOTAL) BY MOUTH ONCE A WEEK.  Modified Medications   No medications on file  Discontinued Medications   HYDROCODONE-HOMATROPINE (HYCODAN) 5-1.5 MG/5ML SYRUP    Take 5 mLs by mouth every 6 (six) hours as needed for cough.   LABETALOL (NORMODYNE) 200 MG TABLET    TAKE 1 TABLET (200 MG TOTAL) BY MOUTH 2 (TWO) TIMES DAILY.   METFORMIN (GLUCOPHAGE) 500 MG TABLET    TAKE 1 TABLET (500 MG TOTAL) BY MOUTH 2 (TWO) TIMES DAILY WITH A MEAL.   SIMVASTATIN (ZOCOR) 40 MG TABLET    TAKE 1 TABLET (40 MG TOTAL) BY MOUTH AT BEDTIME.   SIMVASTATIN (ZOCOR) 40 MG TABLET    TAKE 1 TABLET (40 MG TOTAL) BY MOUTH AT BEDTIME.

## 2012-07-30 NOTE — Patient Instructions (Addendum)
Today we updated your med list in our EPIC system...    Continue your current medications the same...  Today we did your folloe up FASTING blood work...    We will contact you w/ the results when available...   Keep up the good work w/ DIET & EXERCISE!!!  Call for any questions...  Let's plan a follow up visit in 59mo, sooner if needed for problems.Marland KitchenMarland Kitchen

## 2012-08-01 LAB — MULTIPLE MYELOMA PANEL, SERUM
Albumin ELP: 53.8 % — ABNORMAL LOW (ref 55.8–66.1)
Alpha-1-Globulin: 4.9 % (ref 2.9–4.9)
Alpha-2-Globulin: 15 % — ABNORMAL HIGH (ref 7.1–11.8)
IgA: 192 mg/dL (ref 69–380)
IgG (Immunoglobin G), Serum: 1240 mg/dL (ref 690–1700)
IgM, Serum: 43 mg/dL — ABNORMAL LOW (ref 52–322)
Total Protein: 7 g/dL (ref 6.0–8.3)

## 2013-01-02 ENCOUNTER — Encounter (HOSPITAL_COMMUNITY): Payer: Self-pay | Admitting: Emergency Medicine

## 2013-01-02 ENCOUNTER — Emergency Department (HOSPITAL_COMMUNITY)
Admission: EM | Admit: 2013-01-02 | Discharge: 2013-01-03 | Disposition: A | Discharge status: Home / Self Care | Payer: Medicare Other | Attending: Emergency Medicine | Admitting: Emergency Medicine

## 2013-01-02 DIAGNOSIS — E669 Obesity, unspecified: Secondary | ICD-10-CM | POA: Insufficient documentation

## 2013-01-02 DIAGNOSIS — E119 Type 2 diabetes mellitus without complications: Secondary | ICD-10-CM | POA: Insufficient documentation

## 2013-01-02 DIAGNOSIS — R059 Cough, unspecified: Secondary | ICD-10-CM | POA: Insufficient documentation

## 2013-01-02 DIAGNOSIS — Z8719 Personal history of other diseases of the digestive system: Secondary | ICD-10-CM | POA: Insufficient documentation

## 2013-01-02 DIAGNOSIS — R079 Chest pain, unspecified: Secondary | ICD-10-CM | POA: Insufficient documentation

## 2013-01-02 DIAGNOSIS — D649 Anemia, unspecified: Secondary | ICD-10-CM

## 2013-01-02 DIAGNOSIS — J3489 Other specified disorders of nose and nasal sinuses: Secondary | ICD-10-CM | POA: Insufficient documentation

## 2013-01-02 DIAGNOSIS — N289 Disorder of kidney and ureter, unspecified: Secondary | ICD-10-CM | POA: Insufficient documentation

## 2013-01-02 DIAGNOSIS — M1990 Unspecified osteoarthritis, unspecified site: Secondary | ICD-10-CM | POA: Insufficient documentation

## 2013-01-02 DIAGNOSIS — Z8659 Personal history of other mental and behavioral disorders: Secondary | ICD-10-CM | POA: Insufficient documentation

## 2013-01-02 DIAGNOSIS — Z79899 Other long term (current) drug therapy: Secondary | ICD-10-CM | POA: Insufficient documentation

## 2013-01-02 DIAGNOSIS — I509 Heart failure, unspecified: Secondary | ICD-10-CM

## 2013-01-02 DIAGNOSIS — R05 Cough: Secondary | ICD-10-CM | POA: Insufficient documentation

## 2013-01-02 DIAGNOSIS — E7800 Pure hypercholesterolemia, unspecified: Secondary | ICD-10-CM | POA: Insufficient documentation

## 2013-01-02 DIAGNOSIS — Z7982 Long term (current) use of aspirin: Secondary | ICD-10-CM | POA: Insufficient documentation

## 2013-01-02 DIAGNOSIS — R0789 Other chest pain: Secondary | ICD-10-CM

## 2013-01-02 DIAGNOSIS — E559 Vitamin D deficiency, unspecified: Secondary | ICD-10-CM | POA: Insufficient documentation

## 2013-01-02 NOTE — ED Notes (Signed)
Pt arrived to Ed with a complaint of "a warm feeling rising through my chest."  Pt states she has had a cold for the last month.  Pt states that she had this sensation previously and it was associated with hypertension.  Pt hx hypertension.  Pt states she felt nauseated as well when this warm sensation occurred.

## 2013-01-03 ENCOUNTER — Emergency Department (HOSPITAL_COMMUNITY): Payer: Medicare Other

## 2013-01-03 LAB — COMPREHENSIVE METABOLIC PANEL
ALT: 15 U/L (ref 0–35)
AST: 21 U/L (ref 0–37)
Albumin: 3.7 g/dL (ref 3.5–5.2)
Alkaline Phosphatase: 89 U/L (ref 39–117)
BUN: 29 mg/dL — ABNORMAL HIGH (ref 6–23)
Chloride: 98 mEq/L (ref 96–112)
GFR calc Af Amer: 39 mL/min — ABNORMAL LOW (ref >90)
Potassium: 4.7 mEq/L (ref 3.5–5.1)
Sodium: 132 mEq/L — ABNORMAL LOW (ref 135–145)
Total Bilirubin: 0.2 mg/dL — ABNORMAL LOW (ref 0.3–1.2)
Total Protein: 7.7 g/dL (ref 6.0–8.3)

## 2013-01-03 LAB — POCT I-STAT TROPONIN I
Troponin i, poc: 0.04 ng/mL (ref 0.00–0.08)
Troponin i, poc: 0.04 ng/mL (ref 0.00–0.08)

## 2013-01-03 LAB — CBC WITH DIFFERENTIAL/PLATELET
Basophils Absolute: 0 10*3/uL (ref 0.0–0.1)
Basophils Relative: 0 % (ref 0–1)
Eosinophils Absolute: 0.2 10*3/uL (ref 0.0–0.7)
HCT: 25.7 % — ABNORMAL LOW (ref 36.0–46.0)
Hemoglobin: 8.7 g/dL — ABNORMAL LOW (ref 12.0–15.0)
MCH: 25.6 pg — ABNORMAL LOW (ref 26.0–34.0)
MCHC: 33.9 g/dL (ref 30.0–36.0)
Monocytes Relative: 6 % (ref 3–12)
Neutro Abs: 9 10*3/uL — ABNORMAL HIGH (ref 1.7–7.7)
Neutrophils Relative %: 82 % — ABNORMAL HIGH (ref 43–77)
Platelets: 446 10*3/uL — ABNORMAL HIGH (ref 150–400)
RDW: 15.6 % — ABNORMAL HIGH (ref 11.5–15.5)

## 2013-01-03 LAB — PRO B NATRIURETIC PEPTIDE: Pro B Natriuretic peptide (BNP): 8145 pg/mL — ABNORMAL HIGH (ref 0–450)

## 2013-01-03 LAB — OCCULT BLOOD, POC DEVICE: Fecal Occult Bld: NEGATIVE

## 2013-01-03 NOTE — ED Notes (Signed)
Pt verbalizes understanding 

## 2013-01-03 NOTE — ED Notes (Signed)
Contact # for Wendy Kerr is [place] at [phone]

## 2013-01-03 NOTE — ED Provider Notes (Signed)
CSN: [id number]     Arrival date & time 01/02/13  2312 History   First MD Initiated Contact with Patient 01/03/13 0016     Chief Complaint  Patient presents with  . Hypertension   HPI  History provided by the patient. Patient is an 79 year old female with history of hypertension, diabetes, hyper cholesterolemia who presents with complaints of chest discomfort and concerns for elevated blood pressure. Patient states that earlier today she was having some nausea symptoms with decreased appetite. This evening just prior to arrival she began to have a "warm feeling" in her central chest area. Symptoms lasted approximately 30 minutes and resolved after arriving to the emergency department. She states he symptoms felt like "anxiety" and similar to something she has experienced in the past. Symptoms were not associated with any worsens nausea or any shortness of breath. Patient does report having cough and cold symptoms for the past several weeks. She reports having continued occasional cough that is dry with some congestion symptoms. She has recently been using Delsym for her symptoms. Patient also states that she did not take her normal nightly medications due to her nausea feeling from all day. No episodes of vomiting. No other aggravating or alleviating factors. No other associated symptoms.    Past Medical History  Diagnosis Date  . Bronchitis, mucopurulent recurrent   . Hypertension   . Diabetes mellitus   . Hypercholesterolemia   . Obesity   . Diverticulosis of colon   . Benign neoplasm of pancreas, except islets of Langerhans   . DJD (degenerative joint disease)   . Vitamin D deficiency   . Common migraine   . Generalized anxiety disorder   . Anemia   . Monoclonal gammopathy    Past Surgical History  Procedure Laterality Date  . Resection serous cystadenoma of pancreas  2004    at [facility]  . Abdominal hysterectomy     History reviewed. No pertinent family history. History  Substance  Use Topics  . Smoking status: Never Smoker   . Smokeless tobacco: Never Used  . Alcohol Use: No   OB History   Grav Para Term Preterm Abortions TAB SAB Ect Mult Living                 Review of Systems  Constitutional: Negative for fever and diaphoresis.  HENT: Positive for congestion.   Respiratory: Positive for cough. Negative for shortness of breath and wheezing.   Cardiovascular: Negative for chest pain, palpitations and leg swelling.  Gastrointestinal: Positive for nausea. Negative for vomiting, diarrhea and constipation.  All other systems reviewed and are negative.    Allergies  Piroxicam  Home Medications   Current Outpatient Rx  Name  Route  Sig  Dispense  Refill  . aspirin 81 MG tablet   Oral   Take 81 mg by mouth daily.           Marland Kitchen aspirin EC 325 MG tablet   Oral   Take 650 mg by mouth once.         . diltiazem (CARDIZEM CD) 240 MG 24 hr capsule      TAKE 1 CAPSULE (240 MG TOTAL) BY MOUTH DAILY.   30 capsule   11   . etodolac (LODINE) 400 MG tablet   Oral   Take 1 tablet (400 mg total) by mouth daily. As needed for arthritis pain   30 tablet   11   . ferrous sulfate 324 (65 FE) MG TBEC  TAKE 1 TABLET TWICE DAILY WITH 500 MG VITAMIN C   60 tablet   3   . labetalol (NORMODYNE) 200 MG tablet      Take 2 tablets by mouth two times daily   120 tablet   11   . losartan-hydrochlorothiazide (HYZAAR) 100-25 MG per tablet      TAKE 1 TABLET BY MOUTH DAILY.   30 tablet   8   . metFORMIN (GLUCOPHAGE) 500 MG tablet      TAKE 1 TABLET BY MOUTH TWICE A DAY   60 tablet   11   . simvastatin (ZOCOR) 40 MG tablet      TAKE 1 TABLET (40 MG TOTAL) BY MOUTH AT BEDTIME.   30 tablet   11   . Vitamin D, Ergocalciferol, (DRISDOL) 50000 UNITS CAPS      TAKE 1 CAPSULE (50,000 UNITS TOTAL) BY MOUTH ONCE A WEEK.   4 capsule   8    BP 173/99  Pulse 83  Temp(Src) 97.7 F (36.5 C) (Oral)  Resp 18  SpO2 97% Physical Exam  Nursing note and  vitals reviewed. Constitutional: She is oriented to person, place, and time. She appears well-developed and well-nourished.  HENT:  Head: Normocephalic and atraumatic.  Neck: Normal range of motion. Neck supple.  Cardiovascular: Normal rate and regular rhythm.   No murmur heard. Pulmonary/Chest: Effort normal and breath sounds normal. No respiratory distress. She has no wheezes. She has no rales.  Abdominal: Soft. There is no tenderness. There is no rebound and no guarding.  Musculoskeletal: Normal range of motion. She exhibits no edema and no tenderness.  Neurological: She is alert and oriented to person, place, and time.  Skin: Skin is warm and dry. No rash noted.  Psychiatric: She has a normal mood and affect. Her behavior is normal.    ED Course  Procedures  DIAGNOSTIC STUDIES: Oxygen Saturation is 97% on room air.    COORDINATION OF CARE:  Nursing notes reviewed. Vital signs reviewed. Initial pt interview and examination performed.   12:25AM patient seen and evaluated. Patient is well-appearing no acute distress. Currently she is asymptomatic. Symptoms are vague and atypical lasting only 30 minutes. Discussed work up plan with pt at bedside, which includes basic labs, troponin, CXR, and EKG. Pt agrees with plan.  Initial diagnostic testing ordered.    Patient was also seen and evaluated with attending physician. Patient is not having any complaints of shortness of breath. She is able to lay flat comfortably. She has not had any exertional dyspnea home. No prior history of CHF.    Results for orders placed during the hospital encounter of 01/02/13  CBC WITH DIFFERENTIAL      Result Value Range   WBC 10.9 (*) 4.0 - 10.5 K/uL   RBC 3.40 (*) 3.87 - 5.11 MIL/uL   Hemoglobin 8.7 (*) 12.0 - 15.0 g/dL   HCT 82.9 (*) 56.2 - 13.0 %   MCV 75.6 (*) 78.0 - 100.0 fL   MCH 25.6 (*) 26.0 - 34.0 pg   MCHC 33.9  30.0 - 36.0 g/dL   RDW 86.5 (*) 78.4 - 69.6 %   Platelets 446 (*) 150 -  400 K/uL   Neutrophils Relative % 82 (*) 43 - 77 %   Neutro Abs 9.0 (*) 1.7 - 7.7 K/uL   Lymphocytes Relative 10 (*) 12 - 46 %   Lymphs Abs 1.1  0.7 - 4.0 K/uL   Monocytes Relative 6  3 - 12 %  Monocytes Absolute 0.7  0.1 - 1.0 K/uL   Eosinophils Relative 2  0 - 5 %   Eosinophils Absolute 0.2  0.0 - 0.7 K/uL   Basophils Relative 0  0 - 1 %   Basophils Absolute 0.0  0.0 - 0.1 K/uL  COMPREHENSIVE METABOLIC PANEL      Result Value Range   Sodium 132 (*) 135 - 145 mEq/L   Potassium 4.7  3.5 - 5.1 mEq/L   Chloride 98  96 - 112 mEq/L   CO2 21  19 - 32 mEq/L   Glucose, Bld 156 (*) 70 - 99 mg/dL   BUN 29 (*) 6 - 23 mg/dL   Creatinine, Ser 8.11 (*) 0.50 - 1.10 mg/dL   Calcium 9.7  8.4 - 91.4 mg/dL   Total Protein 7.7  6.0 - 8.3 g/dL   Albumin 3.7  3.5 - 5.2 g/dL   AST 21  0 - 37 U/L   ALT 15  0 - 35 U/L   Alkaline Phosphatase 89  39 - 117 U/L   Total Bilirubin 0.2 (*) 0.3 - 1.2 mg/dL   GFR calc non Af Amer 33 (*) >90 mL/min   GFR calc Af Amer 39 (*) >90 mL/min  PRO B NATRIURETIC PEPTIDE      Result Value Range   Pro B Natriuretic peptide (BNP) 8145.0 (*) 0 - 450 pg/mL  POCT I-STAT TROPONIN I      Result Value Range   Troponin i, poc 0.04  0.00 - 0.08 ng/mL   Comment 3           OCCULT BLOOD, POC DEVICE      Result Value Range   Fecal Occult Bld NEGATIVE  NEGATIVE  POCT I-STAT TROPONIN I      Result Value Range   Troponin i, poc 0.04  0.00 - 0.08 ng/mL   Comment 3           POCT I-STAT TROPONIN I      Result Value Range   Troponin i, poc 0.04  0.00 - 0.08 ng/mL   Comment 3               Imaging Review Dg Chest 2 View  01/03/2013   CLINICAL DATA:  Warm sensation in chest  EXAM: CHEST  2 VIEW  COMPARISON:  Prior radiograph from 01/29/2011  FINDINGS: There is marked the cardiomegaly, which is likely partly accentuated by AP technique.  Lungs are normally inflated. There is diffuse pulmonary vascular congestion with mild interstitial thickening. No definite focal infiltrate is  identified. There is no pleural effusion or pneumothorax.  Osseous structures are within normal limits.  IMPRESSION: Cardiomegaly with mild diffuse pulmonary vascular congestion.   Electronically Signed   By: Rise Mu M.D.   On: 01/03/2013 01:05    EKG Interpretation   None      Date: 01/03/2013  Rate: 77  Rhythm: normal sinus rhythm  QRS Axis: normal  Intervals: normal  ST/T Wave abnormalities: nonspecific ST/T changes  Conduction Disutrbances:right bundle branch block and left anterior fascicular block  Narrative Interpretation:   Old EKG Reviewed: none available    MDM   1. Chest discomfort   2. Congestive heart failure   3. Anemia   4. Renal insufficiency        Angus Seller, PA-C 01/03/13 [phone]

## 2013-01-04 NOTE — ED Provider Notes (Signed)
Medical screening examination/treatment/procedure(s) were conducted as a shared visit with non-physician practitioner(s) and myself.  I personally evaluated the patient during the encounter 79 year old female presents with a warm sensation to her chest and hypertension. She denied any chest pain. Denies any shortness of breath with exertion or lying flat. Elevated BNP and mild vascular congestion on chest x-ray. Discuss with cardiology fellow. Recommends close followup but does not the patient needs admission at this point.  Loren Racer, MD 01/04/13 [phone]

## 2013-01-08 ENCOUNTER — Ambulatory Visit (INDEPENDENT_AMBULATORY_CARE_PROVIDER_SITE_OTHER): Payer: Medicare Other | Admitting: Pulmonary Disease

## 2013-01-08 ENCOUNTER — Encounter: Payer: Self-pay | Admitting: Pulmonary Disease

## 2013-01-08 ENCOUNTER — Other Ambulatory Visit (INDEPENDENT_AMBULATORY_CARE_PROVIDER_SITE_OTHER): Payer: Medicare Other

## 2013-01-08 ENCOUNTER — Telehealth: Payer: Self-pay | Admitting: Pulmonary Disease

## 2013-01-08 VITALS — BP 140/74 | HR 61 | Temp 97.9°F | Ht 62.0 in | Wt 201.8 lb

## 2013-01-08 DIAGNOSIS — I10 Essential (primary) hypertension: Secondary | ICD-10-CM

## 2013-01-08 DIAGNOSIS — F411 Generalized anxiety disorder: Secondary | ICD-10-CM

## 2013-01-08 DIAGNOSIS — Z23 Encounter for immunization: Secondary | ICD-10-CM

## 2013-01-08 DIAGNOSIS — D136 Benign neoplasm of pancreas: Secondary | ICD-10-CM

## 2013-01-08 DIAGNOSIS — M1990 Unspecified osteoarthritis, unspecified site: Secondary | ICD-10-CM

## 2013-01-08 DIAGNOSIS — E7800 Pure hypercholesterolemia, unspecified: Secondary | ICD-10-CM

## 2013-01-08 DIAGNOSIS — K5730 Diverticulosis of large intestine without perforation or abscess without bleeding: Secondary | ICD-10-CM

## 2013-01-08 DIAGNOSIS — R0789 Other chest pain: Secondary | ICD-10-CM

## 2013-01-08 DIAGNOSIS — D472 Monoclonal gammopathy: Secondary | ICD-10-CM

## 2013-01-08 DIAGNOSIS — D649 Anemia, unspecified: Secondary | ICD-10-CM

## 2013-01-08 DIAGNOSIS — E119 Type 2 diabetes mellitus without complications: Secondary | ICD-10-CM

## 2013-01-08 DIAGNOSIS — E669 Obesity, unspecified: Secondary | ICD-10-CM

## 2013-01-08 DIAGNOSIS — I509 Heart failure, unspecified: Secondary | ICD-10-CM

## 2013-01-08 LAB — CBC WITH DIFFERENTIAL/PLATELET
Basophils Absolute: 0 10*3/uL (ref 0.0–0.1)
Basophils Relative: 0.1 % (ref 0.0–3.0)
HCT: 31.5 % — ABNORMAL LOW (ref 36.0–46.0)
Hemoglobin: 10.3 g/dL — ABNORMAL LOW (ref 12.0–15.0)
Lymphs Abs: 1.1 10*3/uL (ref 0.7–4.0)
MCV: 77.9 fl — ABNORMAL LOW (ref 78.0–100.0)
Monocytes Absolute: 0.8 10*3/uL (ref 0.1–1.0)
Monocytes Relative: 10.8 % (ref 3.0–12.0)
Neutrophils Relative %: 71.4 % (ref 43.0–77.0)
Platelets: 518 10*3/uL — ABNORMAL HIGH (ref 150.0–400.0)
RDW: 16 % — ABNORMAL HIGH (ref 11.5–14.6)
WBC: 7.4 10*3/uL (ref 4.5–10.5)

## 2013-01-08 LAB — IBC PANEL
Iron: 46 ug/dL (ref 42–145)
Transferrin: 286.5 mg/dL (ref 212.0–360.0)

## 2013-01-08 MED ORDER — LOSARTAN POTASSIUM 100 MG PO TABS: 100.0000 mg | ORAL_TABLET | Freq: Every day | ORAL | Status: DC

## 2013-01-08 MED ORDER — FUROSEMIDE 40 MG PO TABS: 40.0000 mg | ORAL_TABLET | Freq: Every day | ORAL | Status: DC

## 2013-01-08 NOTE — Telephone Encounter (Signed)
Spoke with the pt's son, Harvie Heck  He wants to let SN know that he very much appreciates the care that we give the pt He also reports that the pt went to ED with nausea 01/02/13, was kept overnight and was found to have an enlarged heart They advised that she see a cardiologist, and he wants her to see Dr. Verdis Prime  He also wants to have SN talk with the pt today about the importance of staying active  Ever since she has retired a year ago, she sits in her chair all day   I advised will get the msg to SN now so that he may address these concerns at her ov today   Thanks!

## 2013-01-08 NOTE — Progress Notes (Signed)
Subjective:    Patient ID: Wendy Kerr, female    DOB: 25-Jul-1930, 79 y.o.   MRN: [id number]  HPI  Review of Systems  Physical Exam  Subjective:  Patient ID: Wendy Kerr, female    DOB: [birth year], 79 y.o.   MRN: [id number]  HPI 79 y/o BF here for a follow up visit... she has multiple medical problems including HBP;  Hyperchol & DM;  Obesity;  Divertics;  DJD;  & Anemia w/ MGUS... her granddau is a Optometrist in [employer] residency)... She worked at [employer]	[place] for [employer]...  ~  August 02, 2011:  88mo ROV & Shigeko is finally retiring from [employer]	[place] after 53 yrs on the job!  She is planning on joining the [employer] & silver sneakers to aid in wt reduction; her CC is arthritis pain (esp hands) but she has LODINE for prn use & encouraged to take it & try hot soaks...      We reviewed prob list, meds, xrays and labs> see below>> LABS 6/13:  FLP- at goals on Simva40;  Chems- ok w/ BS=103 A1c=6.0;  CBC- anemic w/ Hg=9.1 Fe=55 (13%);  TSH=1.86;  VitD=43;  SPE/IEP- pending   ~  January 29, 2012:  88mo ROV & [name] is doing satis- no new complaints or concerns... We reviewed the following medical problems during today's office visit >>     AR, Hx bronchitis> on allergy shots and Patanase per DrVanWinkle...    HBP> on ASA81, Labet200Bid, Cardizem240, LosarHCT100-25;  BP=164/82 but she denies CP, palpit, dizzy, SOB, edema, etc;  we decided to incr Labet200-2Bid...    DM> on Metform500bid, & still on Actos15;  BS=138, A1c=6.5;  rec to continue same, improve diet, get wt down...    Chol> on Simva40;  Last FLP 6/13 showed TChol146, TG 74, HDL 72, LDL 60;  rec to continue same...    Overwt> wt= 203# unchanged & BMI= 35;  We reviewed diet, exercise, wt reduction strategies...    GI- Divertics, polyp> last colon 11/10 by DrJacobs w/ divertics & one sm adenoma removed (there is a pos FamHx colon ca)...    Hx benign pancreatic neoplasm> s/p surg at [facility] for pancreatic lesion= benign serous  cystadenoma & they continue to check her periodically...    DJD, VitD defic> on Etodolac400bid prn & VitD 50K/wk; follwed by DrSDean w/ shots in her knees & wrist splints for CTS; last Vit D level 6/13 = 43...    Anemia, MGUS> on FeSO4 daily; Labs show Hg= 10.5, Fe=56 (15%sat); last SPE 6/13 showed stable IgG kappa paraprot... We reviewed prob list, meds, xrays and labs> see below for updates >> she had Flu shot 10/13  ~  July 30, 2012:  88mo ROV & [name]'s CC is some left hip discomfort- she has Lodine for this but hasn't taken- asked to try this & some warm soaks (if not responding- refer to Ortho)... We reviewed the following medical problems during today's office visit >>     AR, Hx bronchitis> on allergy shots and Patanase per DrVanWinkle; saw him 4/14 for Depo80, Allegra, Nasonex, Patanase refills...     HBP> on ASA81, Labet200-2Bid, Cardizem240, LosarHCT100-25;  BP=140/78 & she denies CP, palpit, dizzy, SOB, edema, etc...    DM> on Metform500bid;  BS=120, A1c=6.3;  rec to continue same, improve diet, get wt down...    Chol> on Simva40; FLP 6/14 shows TChol 185, TG 119, HDL 60, LDL 102;  rec to continue same, plus better diet.Marland KitchenMarland Kitchen  Overwt> wt= 201# unchanged & BMI= 35;  We reviewed diet, exercise, wt reduction strategies...    GI- Divertics, polyp> last colon 11/10 by DrJacobs w/ divertics & one sm adenoma removed (there is a pos FamHx colon ca)...    Hx benign pancreatic neoplasm> s/p surg at [facility] for pancreatic lesion= benign serous cystadenoma & they continue to check her periodically...    DJD, VitD defic> on Etodolac400bid prn & VitD 50K/wk; follwed by DrSDean w/ shots in her knees & wrist splints for CTS; last Vit D level 6/13 = 43...    Anemia, MGUS> on FeSO4 daily; Labs show Hg= 10.5, Fe=63 (17%sat); SPE 6/14 shows stable IgG kappa paraprot... We reviewed prob list, meds, xrays and labs> see below for updates >>  LABS 6/14:  FLP- at goals on Simva40, LDL=102;  Chems- ok w/ BS=120  A1c=6.3 on MetformBid;  CBC- anemic w/ Hg=10.5, stable & Fe=63 stable on Iron supplement (continue same);  TSH=1.40;  SPE shows stable MGUS, no change...   ~  January 08, 2013:  55mo ROV & post-ER check> went to ER 11/7 w/ "stress feeling" in her chest, kept overnight- ruled out for MI & disch for outpt follow up- she would like Cardiology eval by DrHSmith & we will set this up for her...    In the ER 01/02/13>  They noted her hx of HBP, Chol, DM risk factors- presented w/ nausea and chest discomfort (self limited- resolved spont after ), recent URI, some anxiety- CXR showed cardiomeg, clear lungs w/ some vasc congestion; EKG showed NSR, rate77, RBBB, LAD, NSSTTWA;  LABS showed Enz=neg, BS=156, BUN=29, Cr=1.4, BNP=8100, Hg=8.7 MCV=76 w/ neg stool hemoccult...    HBP> off ASA81 since ER, on Labet200-2Bid, Cardizem240, LosarHCT100-25;  BP=140/74 & she denies furtherCP, palpit, dizzy, SOB, edema, etc; we decided to change Hyzaar to LOSARTAN100 + LASIX40...    DM> on Metform500bid; Labs 6/14 BS=120, A1c=6.3;  rec to continue same, improve diet, get wt down; BS in ER was 156...    Chol> on Simva40; FLP 6/14 shows TChol 185, TG 119, HDL 60, LDL 102;  rec to continue same, plus better diet...    Overwt> wt= 202# unchanged & BMI= 35;  We reviewed diet, exercise, wt reduction strategies...    Anemia, MGUS> on FeSO4 daily; Labs show Hg= 10.3, MCV~78, Fe=46 (12%sat); SPE 6/14 shows stable IgG kappa paraprot; we decided to give 2 doses of IV Feraheme... We reviewed prob list, meds, xrays and labs> see below for updates >>  LABS 11/14 in office:  CBC showed Hg=10.3, MCV=78, Fe=46 (12%sat) & we decided to give 2 doses of IV Feraheme 510mg  each...             Problem List:  ALLERGIC RHINITIS>  She is still on weekly allergy shots per DrESL/ VanWinkle... ~  Seen 8/13 by DrVanWinkle- AR and sinusitis, treated w/ Allegra, Nasonex, Patanase, Saline, & given Depo shot & Amox... ~  Seen 4/14 by DrVanWinkle- AR and  wanted Depo shot, refilles Allegra, Nasonex, Patanase... ~  Seen by DrVanWinkle 9/14> they decided to stop immunotherapy, and continue Allegra/ Nasonex, Patanase, etc...  Hx of BRONCHITIS, RECURRENT (ICD-491.9) ~  12/12:  Presents w/ URI, bronchitic exac treated w/ ZPak & Hycodan... ~  CXR 12/12 showed normal heart size, prominent right paratrach region (vasc structures), clear lungs... ~  CXR 11/14 showed cardiomeg, clear lungs w/ some vasc congestion...  HYPERTENSION (ICD-401.9) >>  ~  on ASA 81mg /d, LABETOLOL 200mg Bid, CARDIZEM CD 240mg /d,  HYZAAR 100-25 daily...  ~  12/12: BP= 140/82 and denies HA, fatigue, visual changes, CP, palipit, dizziness, syncope, dyspnea, edema, etc... ~  6/13:  BP= 144/80 & she remains asymptomatic... ~  12/13:  on ASA81, Labet200Bid, Cardizem240, LosarHCT100-25;  BP=164/82 but she denies CP, palpit, dizzy, SOB, edema, etc;  we decided to incr Labet200-2Bid. ~  6/14:  on ASA81, Labet200-2Bid, Cardizem240, LosarHCT100-25;  BP=140/78 & she denies CP, palpit, dizzy, SOB, edema, etc... ~  10/14: off ASA81 since ER, on Labet200-2Bid, Cardizem240, LosarHCT100-25;  BP=140/74 & she denies furtherCP, palpit, dizzy, SOB, edema, etc; we decided to change Hyzaar to LOSARTAN100 + LASIX40.  CARDIAC EVALUATION >> by DrHSmith... ~  CXR 11/14 showed cardiomeg, clear lungs w/ some vasc congestion (w/ BNP=8100 in ER)... ~  EKG showed  NSR, rate77, RBBB, LAD, NSSTTWA... ~  2DEcho => pending... ~  Myoview => pending...  DIABETES MELLITUS (ICD-250.00) - on METFORMIN 500mg Bid, ACTOS 15mg /d... she has a difficult time w/ diet & exercise esp due to her knee arthritis & we discussed swimming etc...Marland KitchenMarland Kitchen. BS at home betw 110-140 she says... ~  labs 6/08 showed BS= 124, HgA1c= 6.3 ~  labs 6/09 showed BS= 124, HgA1c= 6.4.Marland KitchenMarland Kitchen rec- same meds, better diet! ~  labs 12/09 showed BS= 126, A1c= 6.1 ~  labs 10/10 showed BS= 122, A1c= 6.1 ~  labs 3/11 showed BS= 120, A1c= 6.3 ~  labs 12/11 showed BS=  103, A1c= 6.1 ~  Labs 6/12 showed BS= 108, A1c not done... ~  Labs 12/12 showed BS= 121, A1c= 6.3 ~  Labs 6/13 on Metform500Bid+Actos15 showed BS=103, A1c=6.0; rec to STOP Actos (but she never stopped this med)... ~  Labs 12/13 on Metform500bid, & still on Actos15;  BS=138, A1c=6.5;  rec to continue same, improve diet, get wt down... ~  6/14:  on Metform500bid;  BS=120, A1c=6.3;  rec to continue same, improve diet, get wt down.  HYPERCHOLESTEROLEMIA (ICD-272.0) - now on SIMVASTATIN 40mg /d (prev Vytorin, then Simva80). ~  FLP 6/08 showed TChol 141, TG 80, HDL 66, LDL 59... LFT's were normal. ~  FLP 6/09 on Vytorin10/40 showed TChol 223, TG 105, HDL 56, LDL 143... rec- incr to 10/80... ~  FLP 12/09 on Vytorin 10/80 showed TChol 159, TG 62, HDL 89, LDL 58... ch to Simva80 for $$ ~  FLP 10/10 on Simva80 showed TChol 187, TG 78, HDL 64, LDL 107 ~  Simvastatin was decreased to 40mg /d based on the new guidelines... ~  FLP 12/11 on Simva40 showed TChol 170, TG 62, HDL 68, LDL 90 ~  FLP 6/12 on Simva40 showed TChol 170, TG 86, HDL 68, LDL 85 ~  FLP 12/12 on Simva40 showed TChol 176, TG 74, HDL 78, LDL 82 ~  FLP 6/13 on Simva40 showed TChol 146, TG 74, HDL 72, LDL 60 ~  FLP 6/14 on simva40 showed TChol 185, TG 119, HDL 60, LDL 102   OBESITY (ICD-278.00) - we discussed diet + exercise required to lose weight... ~  weight 1/07 = 188# ~  weight 12/08 = 202# ~  weight 12/09 = 201# ~  weight 3/11 = 201# ~  weight 11/11 = 207# ~  Weight 6/12 = 202# ~  Weight 12/12 = 205# ~  Weight 6/13 = 204# ~  Weight 12/13 = 203# ~  Weight 6/14 = 201# ~  Weight 10/14 = 202#  DIVERTICULOSIS OF COLON (ICD-562.10) - there is a family hx of colon cancer... ~  colonoscopy 2/03 by DrSam showed divertics  only...  ~  f/u colonoscopy 11/10 by DrJacobs showed 1 sm polyp= tubular adenoma, & divertics...  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 she has a routine f/u appt sched yearly> now every other yr.  DEGENERATIVE JOINT DISEASE (ICD-715.90) - uses ETODOLAC 400mg Bid as needed... she recently saw DrDean w/ shots in her knees... ~  11/11:  she reports early CTS per DrDean & Rx w/ wrist splints & improved...  VITAMIN D DEFICIENCY (ICD-268.9) - Vit D level 6/09 was 20 & pt Rx w/ 50,000 u weekly... ~  labs 12/09 showed Vit D level = 30... she chose to continue the 50K weekly. ~  labs 3/11 showed Vit D level = 28... ?taking 50K each week?- continue. ~  Labs 6/12 showed Vit D = 40... rec to continue 50K ewach week... ~  Labs 6/13 showed Vit D level = 43  Hx of COMMON MIGRAINE (ICD-346.10) -prev eval by DrAdelman in the past...  ANXIETY DISORDER, GENERALIZED (ICD-300.02)  ANEMIA NOS (ICD-285.9) - on FeSO4 + Vit C daily... ~  labs 6/09 showed Hg~ 10, Hct= 30, MCV= 78, Fe= 70... ~  labs 12/09 showed Hg= 10.4, MCV= 79... ~  labs 3/11 showed Hg= not done... ~  labs 11/11 showed Hg= 9.5, MVC= 80, Fe= 61 (12%sat) ~  Labs 6/12 showed Hg= 10.0, MCV= 81 ~  Labs 12/12 showed Hg= 9.8, MCV= 80, Fe= 34 (7%sat) on Fe+VitC daily; followed for GI by DrJacobs w/ colon 11/10 (1 sm polyp removed); rec to incr Fe/VitC to Bid & may need IV Fe if not responding. ~  Labs 6/13 on Fe one daily showed Hg= 9.1, MCV= 81, Fe= 55 (13%sat); rec incr Fe to Bid dosing... ~  Labs 12/13 on Fe daily showed Hg= 10.5, Fe=56 (15%sat); last SPE 6/13 showed stable IgG kappa paraprot... ~  Labs 6/14 on Fe daily showed Hg= 10.5, Fe=63 (17%sat); SPE 6/14 shows stable IgG kappa paraprot... ~  Labs 10/14 in ER showed Hg= 8.7 w/ MCV=76, stool check neg for blood; in office Hg= 10.3, Fe=46 (12%sat) & we decided to give Feraheme 510mg  x2 IV as outpt...  MONOCLONAL GAMMOPATHY (ICD-273.1) - MGUS diagnosed w/ prev anemia work up... she has a monoclonal IgG Kappa paraprotein identified... we have been following SPE/ IEP yearly without any  progression...  ~  labs 6/08 showed monoclonal IgG kappa protein- 0.37g/dl & quant Ig's showed IgG level = 978 mg/dl. ~  labs 4/78 showed monoclonal IgG kappa protein- 0.41 g/dl & quant Ig's showed IgG level = 1260 mg/dl. ~  labs 2/95 showed monoclonal IgG kappa protein- 0.37g/dl & quant Ig's showed IgG level = 1190 mg/dl. ~  Labs 6/21 showed monoclonal IgG kappa protein- 0.41g/dl & quant Ig's showed IgG level = 1190 mg/dl ~  Labs 3/08 showed monoclonal IgG kappa protein- 0.39 g/dl & quant Ig's showed IgG level = 1110mg /dl ~  Labs 6/57 showed monoclonal IgG kappa protein- 0.53gm/dl & quant Ig's showed IgG level = 1240mg /dl   Past Surgical History  Procedure Laterality Date  . Resection serous cystadenoma of pancreas  2004    at [facility]  . Abdominal hysterectomy      Outpatient Encounter Prescriptions as of 01/08/2013  Medication Sig  . aspirin 81 MG tablet Take 81 mg by mouth daily.    Marland Kitchen aspirin EC 325 MG tablet Take 325 mg by mouth as  needed.   . diltiazem (CARDIZEM CD) 240 MG 24 hr capsule TAKE 1 CAPSULE (240 MG TOTAL) BY MOUTH DAILY.  Marland Kitchen etodolac (LODINE) 400 MG tablet Take 1 tablet (400 mg total) by mouth daily. As needed for arthritis pain  . ferrous sulfate 324 (65 FE) MG TBEC TAKE 1 TABLET TWICE DAILY WITH 500 MG VITAMIN C  . labetalol (NORMODYNE) 200 MG tablet Take 2 tablets by mouth two times daily  . losartan-hydrochlorothiazide (HYZAAR) 100-25 MG per tablet TAKE 1 TABLET BY MOUTH DAILY.  . metFORMIN (GLUCOPHAGE) 500 MG tablet TAKE 1 TABLET BY MOUTH TWICE A DAY  . simvastatin (ZOCOR) 40 MG tablet TAKE 1 TABLET (40 MG TOTAL) BY MOUTH AT BEDTIME.  Marland Kitchen Vitamin D, Ergocalciferol, (DRISDOL) 50000 UNITS CAPS TAKE 1 CAPSULE (50,000 UNITS TOTAL) BY MOUTH ONCE A WEEK.    Allergies  Allergen Reactions  . Piroxicam     REACTION: HEADACHE    Current Medications, Allergies, Past Medical History, Past Surgical History, Family History, and Social History were reviewed in [employer] record.    Review of Systems        See HPI - all other systems neg except as noted... The patient complains of dyspnea on exertion.  The patient denies anorexia, fever, weight loss, weight gain, vision loss, decreased hearing, hoarseness, chest pain, syncope, peripheral edema, prolonged cough, headaches, hemoptysis, abdominal pain, melena, hematochezia, severe indigestion/heartburn, hematuria, incontinence, muscle weakness, suspicious skin lesions, transient blindness, difficulty walking, depression, unusual weight change, abnormal bleeding, enlarged lymph nodes, and angioedema.     Objective:   Physical Exam     WD, Overweight,  79 y/o BF in NAD... GENERAL:  Alert & oriented; pleasant & cooperative... HEENT:  [place]/AT, EOM-wnl, PERRLA, EACs-clear, TMs-wnl, NOSE-clear, THROAT-clear & wnl. NECK:  Supple w/ fairROM; no JVD; normal carotid impulses w/o bruits; no thyromegaly or nodules palpated; no lymphadenopathy. CHEST:  Clear to P & A; without wheezes/ rales/ or rhonchi. HEART:  Regular Rhythm; without murmurs/ rubs/ or gallops. ABDOMEN:  Soft & nontender; scar of prev surg; normal bowel sounds; no organomegaly or masses detected. EXT: without deformities, mod arthritic changes; no varicose veins/ +venous insuffic/ no edema. NEURO:  CN's intact;  no focal neuro deficits... DERM:  No lesions noted; no rash etc...  RADIOLOGY DATA:  Reviewed in the EPIC EMR & discussed w/ the patient...  LABORATORY DATA:  Reviewed in the EPIC EMR & discussed w/ the patient...   Assessment & Plan:    Episode of CP, SOB, cardiomeg, abnEKG=> refer to Cards DrHSmith for further eval...   AR/ Bronchitis>  Stable & breathing well; prev URI treated w/ ZPak & Hycodan prn...  HBP>  BP stable on Labet 200mg -2Bid, DiltiazemCD240, & Hyzaar100-25; we decided to change the latter to Losartan100 + Lasix40...  DM>  Control is good on Metform monotherapy- continue same...  CHOL>  Stable on  Simva40; same thing applies to diet efforts!  OBESITY>  We again reviewed diet + exercise prescriptions...  GI> Divertics/ Polyps>  Followed by DrJacobs and up to date w/ colon 11/10 & 1 sm polyp removed...  Hx Pancreatic serous cystadenoma removed at [facility] 2004> stable & doing well; she follows up at [facility] every other yr.  DJD>  On Etodolac & followed by DrDean...  Vit D defic>  Remains on Vit D 50K weekly...  Anemia & MGUS>  Persist anemia (Hg=10.3) & Fe is borderline low (46- 12%sat) on daily supplement by mouth (we decided to supplement w/  IV Feraheme 510mg  x2; no progression of the MGUS w/ IgG kappa prot & 0.53gm/dL of 1.61WR/UE total (all parameters about the same as last yr)...   Patient's Medications  New Prescriptions   No medications on file  Previous Medications   ASPIRIN 81 MG TABLET    Take 81 mg by mouth daily.     ASPIRIN EC 325 MG TABLET    Take 325 mg by mouth as needed.    DILTIAZEM (CARDIZEM CD) 240 MG 24 HR CAPSULE    TAKE 1 CAPSULE (240 MG TOTAL) BY MOUTH DAILY.   ETODOLAC (LODINE) 400 MG TABLET    Take 1 tablet (400 mg total) by mouth daily. As needed for arthritis pain   FERROUS SULFATE 324 (65 FE) MG TBEC    TAKE 1 TABLET TWICE DAILY WITH 500 MG VITAMIN C   LABETALOL (NORMODYNE) 200 MG TABLET    Take 2 tablets by mouth two times daily   LOSARTAN-HYDROCHLOROTHIAZIDE (HYZAAR) 100-25 MG PER TABLET    TAKE 1 TABLET BY MOUTH DAILY.   METFORMIN (GLUCOPHAGE) 500 MG TABLET    TAKE 1 TABLET BY MOUTH TWICE A DAY   SIMVASTATIN (ZOCOR) 40 MG TABLET    TAKE 1 TABLET (40 MG TOTAL) BY MOUTH AT BEDTIME.   VITAMIN D, ERGOCALCIFEROL, (DRISDOL) 50000 UNITS CAPS    TAKE 1 CAPSULE (50,000 UNITS TOTAL) BY MOUTH ONCE A WEEK.  Modified Medications   No medications on file  Discontinued Medications   No medications on file

## 2013-01-08 NOTE — Patient Instructions (Signed)
Today we updated your med list in our EPIC system...     We decided to change your Hyzaar to>>    LOSARTAN 100mg  per day, and     LASIX (Furosemide) 40mg  one tab each AM...  We will refer you to DrHSmith for a thorough cardiac evaluation w/ a 2DEchocardiogram...  Today we rechecked your blood count 7 iron level due to the 2 pt drop seen on the ER labs...    We will contact you w/ the results when available...   Let's get on track w/ our diet & exercise program, get the weight down!  Call for any questions...  Let's plan a follow up visit in 1 month.Marland KitchenMarland Kitchen

## 2013-01-12 ENCOUNTER — Other Ambulatory Visit: Payer: Self-pay | Admitting: Pulmonary Disease

## 2013-01-12 DIAGNOSIS — D649 Anemia, unspecified: Secondary | ICD-10-CM

## 2013-01-20 ENCOUNTER — Encounter: Payer: Self-pay | Admitting: Interventional Cardiology

## 2013-01-20 ENCOUNTER — Ambulatory Visit (INDEPENDENT_AMBULATORY_CARE_PROVIDER_SITE_OTHER): Payer: Medicare Other | Admitting: Interventional Cardiology

## 2013-01-20 VITALS — BP 140/78 | HR 87 | Ht 62.0 in | Wt 203.0 lb

## 2013-01-20 DIAGNOSIS — E119 Type 2 diabetes mellitus without complications: Secondary | ICD-10-CM

## 2013-01-20 DIAGNOSIS — I5023 Acute on chronic systolic (congestive) heart failure: Secondary | ICD-10-CM

## 2013-01-20 DIAGNOSIS — I10 Essential (primary) hypertension: Secondary | ICD-10-CM

## 2013-01-20 DIAGNOSIS — I5022 Chronic systolic (congestive) heart failure: Secondary | ICD-10-CM

## 2013-01-20 DIAGNOSIS — R0789 Other chest pain: Secondary | ICD-10-CM

## 2013-01-20 DIAGNOSIS — I509 Heart failure, unspecified: Secondary | ICD-10-CM

## 2013-01-20 DIAGNOSIS — R072 Precordial pain: Secondary | ICD-10-CM

## 2013-01-20 NOTE — Patient Instructions (Signed)
Your physician has requested that you have an echocardiogram. Echocardiography is a painless test that uses sound waves to create images of your heart. It provides your doctor with information about the size and shape of your heart and how well your heart's chambers and valves are working. This procedure takes approximately one hour. There are no restrictions for this procedure.  Your physician has requested that you have a lexiscan myoview. For further information please visit [email or web address] Please follow instruction sheet, as given.  Follow up pending results of echo and lexiscan

## 2013-01-20 NOTE — Progress Notes (Signed)
Patient ID: Wendy Kerr, female   DOB: 04-27-1930, 79 y.o.   MRN: [id number]   Date: 01/20/2013 ID: Wendy Kerr, DOB 07-10-1930, MRN [id number] PCP: Michele Mcalpine, MD  Reason: Congestive heart failure  ASSESSMENT;  1. Suspect acute on chronic systolic heart failure, possibly hypertension related. Rule out CAD 2. Long-standing systemic arterial hypertension 3. Recent prolonged episode of chest discomfort with myocardial infarction ruled out in the emergency room 4. Diabetes mellitus  PLAN:  1. Multiple office visits and recent ER records were reviewed and required considerable time. 2. 2-D Doppler echocardiogram to assess LV size and function. Clinical exam suggests decreased LV EF 3. Myocardial perfusion study needs to be performed to rule out ischemia as the source of the patient's chest discomfort   SUBJECTIVE: Wendy Kerr is a 79 y.o. female who is very pleasant and spry. She is accompanied by her son, Ranee Peasley, who is a cardiac patient with prior bypass history. The concern with [name] is that about 2 weeks ago she developed precordial chest discomfort and was seen in the emergency room at [facility]. She was evaluated in the emergency room and sent home. Abnormalities found included new cardiac enlargement, and a BNP greater than 5000. No specific management was started until she saw Dr. Kriste Basque, her primary physician. He started furosemide and increase the dose of losartan. The patient had no complaints of dyspnea. She has had no recurrence of chest discomfort. The chest discomfort is poorly characterized but on a 10 scale was 5 in intensity. Since the emergency room visit she has been active and has had no limitations. There is no orthopnea, PND, palpitations, or episodes of syncope.   Allergies  Allergen Reactions  . Piroxicam     REACTION: HEADACHE    Current Outpatient Prescriptions on File Prior to Visit  Medication Sig Dispense Refill  .  aspirin 81 MG tablet Take 81 mg by mouth daily.        Marland Kitchen diltiazem (CARDIZEM CD) 240 MG 24 hr capsule TAKE 1 CAPSULE (240 MG TOTAL) BY MOUTH DAILY.  30 capsule  11  . etodolac (LODINE) 400 MG tablet Take 1 tablet (400 mg total) by mouth daily. As needed for arthritis pain  30 tablet  11  . ferrous sulfate 324 (65 FE) MG TBEC TAKE 1 TABLET TWICE DAILY WITH 500 MG VITAMIN C  60 tablet  3  . furosemide (LASIX) 40 MG tablet Take 1 tablet (40 mg total) by mouth daily.  30 tablet  11  . labetalol (NORMODYNE) 200 MG tablet Take 2 tablets by mouth two times daily  120 tablet  11  . losartan (COZAAR) 100 MG tablet Take 1 tablet (100 mg total) by mouth daily.  30 tablet  11  . metFORMIN (GLUCOPHAGE) 500 MG tablet TAKE 1 TABLET BY MOUTH TWICE A DAY  60 tablet  11  . simvastatin (ZOCOR) 40 MG tablet TAKE 1 TABLET (40 MG TOTAL) BY MOUTH AT BEDTIME.  30 tablet  11  . Vitamin D, Ergocalciferol, (DRISDOL) 50000 UNITS CAPS TAKE 1 CAPSULE (50,000 UNITS TOTAL) BY MOUTH ONCE A WEEK.  4 capsule  8   No current facility-administered medications on file prior to visit.    Past Medical History  Diagnosis Date  . Bronchitis, mucopurulent recurrent   . Hypertension   . Diabetes mellitus   . Hypercholesterolemia   . Obesity   . Diverticulosis of colon   . Benign neoplasm of pancreas, except islets  of Langerhans   . DJD (degenerative joint disease)   . Vitamin D deficiency   . Common migraine   . Generalized anxiety disorder   . Anemia   . Monoclonal gammopathy     Past Surgical History  Procedure Laterality Date  . Resection serous cystadenoma of pancreas  2004    at [facility]  . Abdominal hysterectomy      History   Social History  . Marital Status: Single    Spouse Name: N/A    Number of Children: 2  . Years of Education: N/A   Occupational History  . 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 Smoking status: Never Smoker   . Smokeless tobacco: Never Used  . Alcohol Use:  No  . Drug Use: No  . Sexual Activity: Not on file   Other Topics Concern  . Not on file   Social History Narrative  . No narrative on file    Family History  Problem Relation Age of Onset  . Hypertension Mother   . Kidney failure Mother   . Hypertension Father   . Kidney failure Father     ROS: Denies melena, hemoptysis, cough, exertional chest discomfort, abdominal pain, constipation and diarrhea, leg swelling, chills, and fever. Her appetite is been stable.. Other systems negative for complaints.  OBJECTIVE: BP 140/78  Pulse 87  Ht 5\' 2"  (1.575 m)  Wt 203 lb (92.08 kg)  BMI 37.12 kg/m2,  General: No acute distress, appearing younger than her stated age. HEENT: normal without jaundice or pallor Neck: JVD moderate elevation with the patient lying at 60. Carotids absent bruits Chest: Faint basal rales are heard Cardiac: Murmur: 1-2 of 6 apical systolic murmur.. Gallop: S4 gallop and? S3. Rhythm: Regular. Other: Normal Abdomen: Bruit: Absent. Pulsation: Absent Extremities: Edema: Absent. Pulses: 2+ Neuro: Normal Psych: Normal  ECG: Normal sinus rhythm with right bundle branch block and left anterior hemiblock

## 2013-01-21 ENCOUNTER — Encounter: Payer: Self-pay | Admitting: Interventional Cardiology

## 2013-01-21 DIAGNOSIS — I5022 Chronic systolic (congestive) heart failure: Secondary | ICD-10-CM | POA: Insufficient documentation

## 2013-01-26 ENCOUNTER — Ambulatory Visit: Payer: Medicare Other | Admitting: Pulmonary Disease

## 2013-01-30 ENCOUNTER — Other Ambulatory Visit: Payer: Self-pay | Admitting: Pulmonary Disease

## 2013-02-04 ENCOUNTER — Encounter (HOSPITAL_COMMUNITY)
Admission: RE | Admit: 2013-02-04 | Discharge: 2013-02-04 | Disposition: A | Discharge status: Home / Self Care | Payer: Medicare Other | Source: Ambulatory Visit | Attending: Pulmonary Disease | Admitting: Pulmonary Disease

## 2013-02-04 ENCOUNTER — Other Ambulatory Visit (HOSPITAL_COMMUNITY): Payer: Self-pay | Admitting: Pulmonary Disease

## 2013-02-04 VITALS — BP 137/87 | HR 69 | Temp 97.1°F | Resp 18 | Ht 62.0 in | Wt 203.0 lb

## 2013-02-04 DIAGNOSIS — D649 Anemia, unspecified: Secondary | ICD-10-CM

## 2013-02-04 MED ORDER — SODIUM CHLORIDE 0.9 % IV SOLN
INTRAVENOUS | Status: DC
  Administered 2013-02-04: 10:00:00 via INTRAVENOUS

## 2013-02-04 MED ORDER — FERUMOXYTOL INJECTION 510 MG/17 ML
510.0000 mg | Freq: Once | INTRAVENOUS | Status: AC
  Administered 2013-02-04: 510 mg via INTRAVENOUS
  Filled 2013-02-04: qty 17

## 2013-02-04 NOTE — Progress Notes (Signed)
Uneventful infusion of #1/2 Feraheme of 510 mg . Pt is to return 12/ 16/ 14 for 2nd infusion

## 2013-02-05 ENCOUNTER — Ambulatory Visit (HOSPITAL_COMMUNITY): Payer: Medicare Other | Attending: Interventional Cardiology | Admitting: Radiology

## 2013-02-05 DIAGNOSIS — M1990 Unspecified osteoarthritis, unspecified site: Secondary | ICD-10-CM | POA: Insufficient documentation

## 2013-02-05 DIAGNOSIS — I10 Essential (primary) hypertension: Secondary | ICD-10-CM | POA: Insufficient documentation

## 2013-02-05 DIAGNOSIS — R079 Chest pain, unspecified: Secondary | ICD-10-CM | POA: Insufficient documentation

## 2013-02-05 DIAGNOSIS — I059 Rheumatic mitral valve disease, unspecified: Secondary | ICD-10-CM | POA: Insufficient documentation

## 2013-02-05 DIAGNOSIS — E669 Obesity, unspecified: Secondary | ICD-10-CM | POA: Insufficient documentation

## 2013-02-05 DIAGNOSIS — I5022 Chronic systolic (congestive) heart failure: Secondary | ICD-10-CM | POA: Insufficient documentation

## 2013-02-05 DIAGNOSIS — Z6834 Body mass index (BMI) 34.0-34.9, adult: Secondary | ICD-10-CM | POA: Insufficient documentation

## 2013-02-05 DIAGNOSIS — E119 Type 2 diabetes mellitus without complications: Secondary | ICD-10-CM | POA: Insufficient documentation

## 2013-02-05 DIAGNOSIS — E785 Hyperlipidemia, unspecified: Secondary | ICD-10-CM | POA: Insufficient documentation

## 2013-02-05 DIAGNOSIS — I079 Rheumatic tricuspid valve disease, unspecified: Secondary | ICD-10-CM | POA: Insufficient documentation

## 2013-02-05 NOTE — Progress Notes (Signed)
Echocardiogram performed.  

## 2013-02-09 ENCOUNTER — Encounter: Payer: Self-pay | Admitting: Pulmonary Disease

## 2013-02-09 ENCOUNTER — Telehealth: Payer: Self-pay

## 2013-02-09 ENCOUNTER — Ambulatory Visit (INDEPENDENT_AMBULATORY_CARE_PROVIDER_SITE_OTHER): Payer: Medicare Other | Admitting: Pulmonary Disease

## 2013-02-09 VITALS — BP 140/80 | HR 92 | Temp 98.2°F | Ht 62.0 in | Wt 205.2 lb

## 2013-02-09 DIAGNOSIS — F411 Generalized anxiety disorder: Secondary | ICD-10-CM

## 2013-02-09 DIAGNOSIS — E119 Type 2 diabetes mellitus without complications: Secondary | ICD-10-CM

## 2013-02-09 DIAGNOSIS — I10 Essential (primary) hypertension: Secondary | ICD-10-CM

## 2013-02-09 DIAGNOSIS — R0789 Other chest pain: Secondary | ICD-10-CM

## 2013-02-09 DIAGNOSIS — D649 Anemia, unspecified: Secondary | ICD-10-CM

## 2013-02-09 DIAGNOSIS — E7800 Pure hypercholesterolemia, unspecified: Secondary | ICD-10-CM

## 2013-02-09 DIAGNOSIS — M1990 Unspecified osteoarthritis, unspecified site: Secondary | ICD-10-CM

## 2013-02-09 DIAGNOSIS — D472 Monoclonal gammopathy: Secondary | ICD-10-CM

## 2013-02-09 DIAGNOSIS — E669 Obesity, unspecified: Secondary | ICD-10-CM

## 2013-02-09 NOTE — Telephone Encounter (Signed)
Message copied by Jarvis Newcomer on Mon Feb 09, 2013 10:02 AM ------      Message from: Verdis Prime      Created: Thu Feb 05, 2013  6:14 PM       Echo looks better than I thought. There is evidence of diastolic heart failure. This could be accounting for shortness of breath. Overall pumping action of the heart is normal. We need to discuss at the office visit. ------

## 2013-02-09 NOTE — Patient Instructions (Signed)
Today we updated your med list in our EPIC system...    Continue your current medications the same...  You are set up for your second IRON infusion tomorrow...  Let's plan follow up blood work the week of Mar 09, 2013 (just come to our lab any day that week)...    We will contact you w/ the results when available...   Let's get on track w/ our diet & exercise program...  Call for any questions...  Let's plan a follow up visit in 8mo, sooner if needed for problems.Marland KitchenMarland Kitchen

## 2013-02-09 NOTE — Telephone Encounter (Signed)
pt aware of echo results.Echo looks better than I thought. There is evidence of diastolic heart failure. This could be accounting for shortness of breath. Overall pumping action of the heart is normal. We need to discuss at the office visit.pt verbalized understanding

## 2013-02-10 ENCOUNTER — Encounter (HOSPITAL_COMMUNITY): Payer: Self-pay

## 2013-02-10 ENCOUNTER — Other Ambulatory Visit: Payer: Self-pay | Admitting: Pulmonary Disease

## 2013-02-10 ENCOUNTER — Encounter (HOSPITAL_COMMUNITY)
Admission: RE | Admit: 2013-02-10 | Discharge: 2013-02-10 | Disposition: A | Discharge status: Home / Self Care | Payer: Medicare Other | Source: Ambulatory Visit | Attending: Pulmonary Disease | Admitting: Pulmonary Disease

## 2013-02-10 ENCOUNTER — Other Ambulatory Visit (HOSPITAL_COMMUNITY): Payer: Self-pay | Admitting: Pulmonary Disease

## 2013-02-10 MED ORDER — SODIUM CHLORIDE 0.9 % IV SOLN
Freq: Once | INTRAVENOUS | Status: AC
  Administered 2013-02-10: 12:00:00 via INTRAVENOUS

## 2013-02-10 MED ORDER — FERUMOXYTOL INJECTION 510 MG/17 ML
510.0000 mg | Freq: Once | INTRAVENOUS | Status: AC
  Administered 2013-02-10: 510 mg via INTRAVENOUS
  Filled 2013-02-10: qty 17

## 2013-02-11 ENCOUNTER — Ambulatory Visit (HOSPITAL_COMMUNITY): Payer: Medicare Other | Attending: Interventional Cardiology | Admitting: Radiology

## 2013-02-11 VITALS — BP 168/86 | HR 80 | Ht 62.0 in | Wt 202.0 lb

## 2013-02-11 DIAGNOSIS — I10 Essential (primary) hypertension: Secondary | ICD-10-CM | POA: Insufficient documentation

## 2013-02-11 DIAGNOSIS — E119 Type 2 diabetes mellitus without complications: Secondary | ICD-10-CM | POA: Insufficient documentation

## 2013-02-11 DIAGNOSIS — R072 Precordial pain: Secondary | ICD-10-CM

## 2013-02-11 DIAGNOSIS — R079 Chest pain, unspecified: Secondary | ICD-10-CM

## 2013-02-11 MED ORDER — TECHNETIUM TC 99M SESTAMIBI GENERIC - CARDIOLITE
30.0000 | Freq: Once | INTRAVENOUS | Status: AC | PRN
  Administered 2013-02-11: 30 via INTRAVENOUS

## 2013-02-11 MED ORDER — REGADENOSON 0.4 MG/5ML IV SOLN
0.4000 mg | Freq: Once | INTRAVENOUS | Status: AC
  Administered 2013-02-11: 0.4 mg via INTRAVENOUS

## 2013-02-11 MED ORDER — TECHNETIUM TC 99M SESTAMIBI GENERIC - CARDIOLITE
10.0000 | Freq: Once | INTRAVENOUS | Status: AC | PRN
  Administered 2013-02-11: 10 via INTRAVENOUS

## 2013-02-11 NOTE — Progress Notes (Signed)
[facility] 3 NUCLEAR MED [address] [place], [place] [address] [phone]    Cardiology Nuclear Med Study  Wendy Kerr is a 79 y.o. female     MRN : [id number]     DOB: March 29, 1930  Procedure Date: 02/11/2013  Nuclear Med Background Indication for Stress Test:  Evaluation for Ischemia History:  No known CAD, Echo 2014 EF 55-60% Cardiac Risk Factors: Hypertension, Lipids and NIDDM  Symptoms:  Chest Pressure.     Nuclear Pre-Procedure Caffeine/Decaff Intake:  None NPO After: 7:00pm   Lungs:  clear O2 Sat: 96% on room air. IV 0.9% NS with Angio Cath:  22g  IV Site: R Hand  IV Started by:  Bonnita Levan, RN  Chest Size (in):  42 Cup Size: D  Height: 5\' 2"  (1.575 m)  Weight:  202 lb (91.627 kg)  BMI:  Body mass index is 36.94 kg/(m^2). Tech Comments:  N/A    Nuclear Med Study 1 or 2 day study: 1 day  Stress Test Type:  Lexiscan  Reading MD: Verdis Prime, MD  Order Authorizing Provider:  Verdis Prime, MD  Resting Radionuclide: Technetium 17m Sestamibi  Resting Radionuclide Dose: 11.0 mCi   Stress Radionuclide:  Technetium 70m Sestamibi  Stress Radionuclide Dose: 32.8 mCi           Stress Protocol Rest HR: 80 Stress HR: 93  Rest BP: 168/86 Stress BP: 158/70  Exercise Time (min): n/a METS: n/a           Dose of Adenosine (mg):  n/a Dose of Lexiscan: 0.4 mg  Dose of Atropine (mg): n/a Dose of Dobutamine: n/a mcg/kg/min (at max HR)  Stress Test Technologist: Nelson Chimes, BS-ES  Nuclear Technologist:  Harlow Asa, CNMT     Rest Procedure:  Myocardial perfusion imaging was performed at rest 45 minutes following the intravenous administration of Technetium 66m Sestamibi. Rest ECG: NSR with RBBB and LAHB  Stress Procedure:  The patient received IV Lexiscan 0.4 mg over 15-seconds.  Technetium 68m Sestamibi injected at 30-seconds.  Quantitative spect images were obtained after a 45 minute delay.  During the infusion of Lexiscan, the patient  described a warm feeling.  This symptom completely resolved in recovery.  Stress ECG: No significant change from baseline ECG  QPS Raw Data Images:  Moderate breast attenuation Stress Images:  There is decreased uptake in the inferior wall. Rest Images:  Normal homogeneous uptake in all areas of the myocardium. Subtraction (SDS):  These findings are consistent with ischemia. Transient Ischemic Dilatation (Normal <1.22):  0.92 Lung/Heart Ratio (Normal <0.45):  0.36  Quantitative Gated Spect Images QGS EDV:  108 ml QGS ESV:  53 ml  Impression Exercise Capacity:  Lexiscan with no exercise. BP Response:  Normal blood pressure response. Clinical Symptoms:  No significant symptoms noted. ECG Impression:  No significant ST segment change suggestive of ischemia. Comparison with Prior Nuclear Study: No images to compare  Overall Impression:  Intermediate risk stress nuclear study with ecidence of inferior ischemia.  LV Ejection Fraction: 51%.  LV Wall Motion:  NL LV Function; NL Wall Motion

## 2013-02-15 NOTE — Progress Notes (Signed)
Subjective:    Patient ID: Wendy Kerr, female    DOB: Mar 31, 1930, 79 y.o.   MRN: [id number]  HPI  Review of Systems  Physical Exam  Subjective:  Patient ID: Wendy Kerr, female    DOB: Jan 15, 1931, 79 y.o.   MRN: [id number]  HPI 79 y/o BF here for a follow up visit... she has multiple medical problems including HBP;  Hyperchol & DM;  Obesity;  Divertics;  DJD;  & Anemia w/ MGUS... her granddau is a Optometrist in [employer] Environmental manager residency)... She worked at [employer]	[place] for [employer]...  ~  August 02, 2011:  85mo ROV & Wendy Kerr is finally retiring from [employer]	[place] after 53 yrs on the job!  She is planning on joining the [employer] & silver sneakers to aid in wt reduction; her CC is arthritis pain (esp hands) but she has LODINE for prn use & encouraged to take it & try hot soaks...      We reviewed prob list, meds, xrays and labs> see below>> LABS 6/13:  FLP- at goals on Simva40;  Chems- ok w/ BS=103 A1c=6.0;  CBC- anemic w/ Hg=9.1 Fe=55 (13%);  TSH=1.86;  VitD=43;  SPE/IEP- pending   ~  January 29, 2012:  85mo ROV & Wendy Kerr is doing satis- no new complaints or concerns... We reviewed the following medical problems during today's office visit >>     AR, Hx bronchitis> on allergy shots and Patanase per DrVanWinkle...    HBP> on ASA81, Labet200Bid, Cardizem240, LosarHCT100-25;  BP=164/82 but she denies CP, palpit, dizzy, SOB, edema, etc;  we decided to incr Labet200-2Bid...    DM> on Metform500bid, & still on Actos15;  BS=138, A1c=6.5;  rec to continue same, improve diet, get wt down...    Chol> on Simva40;  Last FLP 6/13 showed TChol146, TG 74, HDL 72, LDL 60;  rec to continue same...    Overwt> wt= 203# unchanged & BMI= 35;  We reviewed diet, exercise, wt reduction strategies...    GI- Divertics, polyp> last colon 11/10 by DrJacobs w/ divertics & one sm adenoma removed (there is a pos FamHx colon ca)...    Hx benign pancreatic neoplasm> s/p surg at [facility] for pancreatic lesion= benign serous  cystadenoma & they continue to check her periodically...    DJD, VitD defic> on Etodolac400bid prn & VitD 50K/wk; follwed by DrSDean w/ shots in her knees & wrist splints for CTS; last Vit D level 6/13 = 43...    Anemia, MGUS> on FeSO4 daily; Labs show Hg= 10.5, Fe=56 (15%sat); last SPE 6/13 showed stable IgG kappa paraprot... We reviewed prob list, meds, xrays and labs> see below for updates >> she had Flu shot 10/13  ~  July 30, 2012:  85mo ROV & Wendy Kerr's CC is some left hip discomfort- she has Lodine for this but hasn't taken- asked to try this & some warm soaks (if not responding- refer to Ortho)... We reviewed the following medical problems during today's office visit >>     AR, Hx bronchitis> on allergy shots and Patanase per DrVanWinkle; saw him 4/14 for Depo80, Allegra, Nasonex, Patanase refills...     HBP> on ASA81, Labet200-2Bid, Cardizem240, LosarHCT100-25;  BP=140/78 & she denies CP, palpit, dizzy, SOB, edema, etc...    DM> on Metform500bid;  BS=120, A1c=6.3;  rec to continue same, improve diet, get wt down...    Chol> on Simva40; FLP 6/14 shows TChol 185, TG 119, HDL 60, LDL 102;  rec to continue same, plus better diet.Marland KitchenMarland Kitchen  Overwt> wt= 201# unchanged & BMI= 35;  We reviewed diet, exercise, wt reduction strategies...    GI- Divertics, polyp> last colon 11/10 by DrJacobs w/ divertics & one sm adenoma removed (there is a pos FamHx colon ca)...    Hx benign pancreatic neoplasm> s/p surg at [facility] for pancreatic lesion= benign serous cystadenoma & they continue to check her periodically...    DJD, VitD defic> on Etodolac400bid prn & VitD 50K/wk; follwed by DrSDean w/ shots in her knees & wrist splints for CTS; last Vit D level 6/13 = 43...    Anemia, MGUS> on FeSO4 daily; Labs show Hg= 10.5, Fe=63 (17%sat); SPE 6/14 shows stable IgG kappa paraprot... We reviewed prob list, meds, xrays and labs> see below for updates >>  LABS 6/14:  FLP- at goals on Simva40, LDL=102;  Chems- ok w/ BS=120  A1c=6.3 on MetformBid;  CBC- anemic w/ Hg=10.5, stable & Fe=63 stable on Iron supplement (continue same);  TSH=1.40;  SPE shows stable MGUS, no change...   ~  January 08, 2013:  70mo ROV & post-ER check> went to ER 11/7 w/ "stress feeling" in her chest, kept overnight- ruled out for MI & disch for outpt follow up- she would like Cardiology eval by DrHSmith & we will set this up for her...    In the ER 01/02/13>  They noted her hx of HBP, Chol, DM risk factors- presented w/ nausea and chest discomfort (self limited- resolved spont after ), recent URI, some anxiety- CXR showed cardiomeg, clear lungs w/ some vasc congestion; EKG showed NSR, rate77, RBBB, LAD, NSSTTWA;  LABS showed Enz=neg, BS=156, BUN=29, Cr=1.4, BNP=8100, Hg=8.7 MCV=76 w/ neg stool hemoccult...    HBP> off ASA81 since ER, on Labet200-2Bid, Cardizem240, LosarHCT100-25;  BP=140/74 & she denies furtherCP, palpit, dizzy, SOB, edema, etc; we decided to change Hyzaar to LOSARTAN100 + LASIX40...    DM> on Metform500bid; Labs 6/14 BS=120, A1c=6.3;  rec to continue same, improve diet, get wt down; BS in ER was 156...    Chol> on Simva40; FLP 6/14 shows TChol 185, TG 119, HDL 60, LDL 102;  rec to continue same, plus better diet...    Overwt> wt= 202# unchanged & BMI= 35;  We reviewed diet, exercise, wt reduction strategies...    Anemia, MGUS> on FeSO4 daily; Labs show Hg= 10.3, MCV~78, Fe=46 (12%sat); SPE 6/14 shows stable IgG kappa paraprot; we decided to give 2 doses of IV Feraheme... We reviewed prob list, meds, xrays and labs> see below for updates >>  LABS 11/14 in office:  CBC showed Hg=10.3, MCV=78, Fe=46 (12%sat) & we decided to give 2 doses of IV Feraheme 510mg  each...   ~  February 09, 2013:  59mo ROV & Wendy Kerr saw DrHSmith 11/14> suspecting ac on chr sysCHF, r/o CAD; 2DEcho 12/14 showed modLVH, severe focal basal septal hypertrophy, norm sys function w/ EF=55-60% and Gr2DD, mild MR, mild LA&RA dil, PAsys=36;    Myoview 12/14 showed decr  uptake in the inferior wall stress images suggesting inferior ischemia, no symptoms, no ST segment changes, normal wall motion and LVF w/ EF=51%;  DrSmith to decide on cath vs med rx...     She has received only 1 of 2 Feraheme infusions due to busy day outpt schedule; we decided to recheck labs in about a month (mid Jan) & go from there...     Her energy is still low but Harvie Heck to get her walking once DrSmith decides about cath.Marland KitchenMarland Kitchen  Problem List:  ALLERGIC RHINITIS>  She is still on weekly allergy shots per DrESL/ VanWinkle... ~  Seen 8/13 by DrVanWinkle- AR and sinusitis, treated w/ Allegra, Nasonex, Patanase, Saline, & given Depo shot & Amox... ~  Seen 4/14 by DrVanWinkle- AR and wanted Depo shot, refilles Allegra, Nasonex, Patanase... ~  Seen by DrVanWinkle 9/14> they decided to stop immunotherapy, and continue Allegra/ Nasonex, Patanase, etc...  Hx of BRONCHITIS, RECURRENT (ICD-491.9) ~  12/12:  Presents w/ URI, bronchitic exac treated w/ ZPak & Hycodan... ~  CXR 12/12 showed normal heart size, prominent right paratrach region (vasc structures), clear lungs... ~  CXR 11/14 showed cardiomeg, clear lungs w/ some vasc congestion...  HYPERTENSION (ICD-401.9) >>  ~  on ASA 81mg /d, LABETOLOL 200mg Bid, CARDIZEM CD 240mg /d, HYZAAR 100-25 daily...  ~  12/12: BP= 140/82 and denies HA, fatigue, visual changes, CP, palipit, dizziness, syncope, dyspnea, edema, etc... ~  6/13:  BP= 144/80 & she remains asymptomatic... ~  12/13:  on ASA81, Labet200Bid, Cardizem240, LosarHCT100-25;  BP=164/82 but she denies CP, palpit, dizzy, SOB, edema, etc;  we decided to incr Labet200-2Bid. ~  6/14:  on ASA81, Labet200-2Bid, Cardizem240, LosarHCT100-25;  BP=140/78 & she denies CP, palpit, dizzy, SOB, edema, etc... ~  10/14: off ASA81 since ER, on Labet200-2Bid, Cardizem240, LosarHCT100-25;  BP=140/74 & she denies furtherCP, palpit, dizzy, SOB, edema, etc; we decided to change Hyzaar to LOSARTAN100 +  LASIX40.  CARDIAC EVALUATION >> by DrHSmith... ~  CXR 11/14 showed cardiomeg, clear lungs w/ some vasc congestion (w/ BNP=8100 in ER)... ~  EKG showed  NSR, rate77, RBBB, LAD, NSSTTWA... ~  2DEcho => pending... ~  Myoview => pending...  DIABETES MELLITUS (ICD-250.00) - on METFORMIN 500mg Bid, ACTOS 15mg /d... she has a difficult time w/ diet & exercise esp due to her knee arthritis & we discussed swimming etc...Marland KitchenMarland Kitchen. BS at home betw 110-140 she says... ~  labs 6/08 showed BS= 124, HgA1c= 6.3 ~  labs 6/09 showed BS= 124, HgA1c= 6.4.Marland KitchenMarland Kitchen rec- same meds, better diet! ~  labs 12/09 showed BS= 126, A1c= 6.1 ~  labs 10/10 showed BS= 122, A1c= 6.1 ~  labs 3/11 showed BS= 120, A1c= 6.3 ~  labs 12/11 showed BS= 103, A1c= 6.1 ~  Labs 6/12 showed BS= 108, A1c not done... ~  Labs 12/12 showed BS= 121, A1c= 6.3 ~  Labs 6/13 on Metform500Bid+Actos15 showed BS=103, A1c=6.0; rec to STOP Actos (but she never stopped this med)... ~  Labs 12/13 on Metform500bid, & still on Actos15;  BS=138, A1c=6.5;  rec to continue same, improve diet, get wt down... ~  6/14:  on Metform500bid;  BS=120, A1c=6.3;  rec to continue same, improve diet, get wt down.  HYPERCHOLESTEROLEMIA (ICD-272.0) - now on SIMVASTATIN 40mg /d (prev Vytorin, then Simva80). ~  FLP 6/08 showed TChol 141, TG 80, HDL 66, LDL 59... LFT's were normal. ~  FLP 6/09 on Vytorin10/40 showed TChol 223, TG 105, HDL 56, LDL 143... rec- incr to 10/80... ~  FLP 12/09 on Vytorin 10/80 showed TChol 159, TG 62, HDL 89, LDL 58... ch to Simva80 for $$ ~  FLP 10/10 on Simva80 showed TChol 187, TG 78, HDL 64, LDL 107 ~  Simvastatin was decreased to 40mg /d based on the new guidelines... ~  FLP 12/11 on Simva40 showed TChol 170, TG 62, HDL 68, LDL 90 ~  FLP 6/12 on Simva40 showed TChol 170, TG 86, HDL 68, LDL 85 ~  FLP 12/12 on Simva40 showed TChol 176, TG 74, HDL 78, LDL 82 ~  FLP 6/13 on Simva40 showed TChol 146, TG 74, HDL 72, LDL 60 ~  FLP 6/14 on simva40 showed TChol  185, TG 119, HDL 60, LDL 102   OBESITY (ICD-278.00) - we discussed diet + exercise required to lose weight... ~  weight 1/07 = 188# ~  weight 12/08 = 202# ~  weight 12/09 = 201# ~  weight 3/11 = 201# ~  weight 11/11 = 207# ~  Weight 6/12 = 202# ~  Weight 12/12 = 205# ~  Weight 6/13 = 204# ~  Weight 12/13 = 203# ~  Weight 6/14 = 201# ~  Weight 10/14 = 202#  DIVERTICULOSIS OF COLON (ICD-562.10) - there is a family hx of colon cancer... ~  colonoscopy 2/03 by DrSam showed divertics only...  ~  f/u colonoscopy 11/10 by DrJacobs showed 1 sm polyp= tubular adenoma, & divertics...  Hx of BEN NEOPLASM PANCREAS EXCEPT ISLETS LANGERHANS (ICD-211.6) - she is s/p resection of a serous cystadenoma of the pancreas at [facility] by DrTyler in 2004... she has a routine f/u appt sched yearly> now every other yr.  DEGENERATIVE JOINT DISEASE (ICD-715.90) - uses ETODOLAC 400mg Bid as needed... she recently saw DrDean w/ shots in her knees... ~  11/11:  she reports early CTS per DrDean & Rx w/ wrist splints & improved...  VITAMIN D DEFICIENCY (ICD-268.9) - Vit D level 6/09 was 20 & pt Rx w/ 50,000 u weekly... ~  labs 12/09 showed Vit D level = 30... she chose to continue the 50K weekly. ~  labs 3/11 showed Vit D level = 28... ?taking 50K each week?- continue. ~  Labs 6/12 showed Vit D = 40... rec to continue 50K ewach week... ~  Labs 6/13 showed Vit D level = 43  Hx of COMMON MIGRAINE (ICD-346.10) -prev eval by DrAdelman in the past...  ANXIETY DISORDER, GENERALIZED (ICD-300.02)  ANEMIA NOS (ICD-285.9) - on FeSO4 + Vit C daily... ~  labs 6/09 showed Hg~ 10, Hct= 30, MCV= 78, Fe= 70... ~  labs 12/09 showed Hg= 10.4, MCV= 79... ~  labs 3/11 showed Hg= not done... ~  labs 11/11 showed Hg= 9.5, MVC= 80, Fe= 61 (12%sat) ~  Labs 6/12 showed Hg= 10.0, MCV= 81 ~  Labs 12/12 showed Hg= 9.8, MCV= 80, Fe= 34 (7%sat) on Fe+VitC daily; followed for GI by DrJacobs w/ colon 11/10 (1 sm polyp removed); rec to incr  Fe/VitC to Bid & may need IV Fe if not responding. ~  Labs 6/13 on Fe one daily showed Hg= 9.1, MCV= 81, Fe= 55 (13%sat); rec incr Fe to Bid dosing... ~  Labs 12/13 on Fe daily showed Hg= 10.5, Fe=56 (15%sat); last SPE 6/13 showed stable IgG kappa paraprot... ~  Labs 6/14 on Fe daily showed Hg= 10.5, Fe=63 (17%sat); SPE 6/14 shows stable IgG kappa paraprot... ~  Labs 10/14 in ER showed Hg= 8.7 w/ MCV=76, stool check neg for blood; in office Hg= 10.3, Fe=46 (12%sat) & we decided to give Feraheme 510mg  x2 IV as outpt...  MONOCLONAL GAMMOPATHY (ICD-273.1) - MGUS diagnosed w/ prev anemia work up... she has a monoclonal IgG Kappa paraprotein identified... we have been following SPE/ IEP yearly without any progression...  ~  labs 6/08 showed monoclonal IgG kappa protein- 0.37g/dl & quant Ig's showed IgG level = 978 mg/dl. ~  labs 1/61 showed monoclonal IgG kappa protein- 0.41 g/dl & quant Ig's showed IgG level = 1260 mg/dl. ~  labs 0/96 showed monoclonal IgG kappa protein- 0.37g/dl & quant Ig's  showed IgG level = 1190 mg/dl. ~  Labs 1/61 showed monoclonal IgG kappa protein- 0.41g/dl & quant Ig's showed IgG level = 1190 mg/dl ~  Labs 0/96 showed monoclonal IgG kappa protein- 0.39 g/dl & quant Ig's showed IgG level = 1110mg /dl ~  Labs 0/45 showed monoclonal IgG kappa protein- 0.53gm/dl & quant Ig's showed IgG level = 1240mg /dl   Past Surgical History  Procedure Laterality Date  . Resection serous cystadenoma of pancreas  2004    at [facility]  . Abdominal hysterectomy      Outpatient Encounter Prescriptions as of 02/09/2013  Medication Sig  . aspirin 81 MG tablet Take 81 mg by mouth daily.    Marland Kitchen diltiazem (CARDIZEM CD) 240 MG 24 hr capsule TAKE 1 CAPSULE (240 MG TOTAL) BY MOUTH DAILY.  Marland Kitchen etodolac (LODINE) 400 MG tablet Take 1 tablet (400 mg total) by mouth daily. As needed for arthritis pain  . furosemide (LASIX) 40 MG tablet Take 1 tablet (40 mg total) by mouth daily.  Marland Kitchen labetalol (NORMODYNE) 200 MG  tablet TAKE 2 TABLETS BY MOUTH TWICE A DAY  . losartan (COZAAR) 100 MG tablet Take 1 tablet (100 mg total) by mouth daily.  . metFORMIN (GLUCOPHAGE) 500 MG tablet TAKE 1 TABLET BY MOUTH TWICE A DAY  . simvastatin (ZOCOR) 40 MG tablet TAKE 1 TABLET (40 MG TOTAL) BY MOUTH AT BEDTIME.  Marland Kitchen Vitamin D, Ergocalciferol, (DRISDOL) 50000 UNITS CAPS TAKE 1 CAPSULE (50,000 UNITS TOTAL) BY MOUTH ONCE A WEEK.  . [DISCONTINUED] ferrous sulfate 324 (65 FE) MG TBEC TAKE 1 TABLET TWICE DAILY WITH 500 MG VITAMIN C    Allergies  Allergen Reactions  . Piroxicam     REACTION: HEADACHE    Current Medications, Allergies, Past Medical History, Past Surgical History, Family History, and Social History were reviewed in [employer] record.    Review of Systems        See HPI - all other systems neg except as noted... The patient complains of dyspnea on exertion.  The patient denies anorexia, fever, weight loss, weight gain, vision loss, decreased hearing, hoarseness, chest pain, syncope, peripheral edema, prolonged cough, headaches, hemoptysis, abdominal pain, melena, hematochezia, severe indigestion/heartburn, hematuria, incontinence, muscle weakness, suspicious skin lesions, transient blindness, difficulty walking, depression, unusual weight change, abnormal bleeding, enlarged lymph nodes, and angioedema.     Objective:   Physical Exam     WD, Overweight,  79 y/o BF in NAD... GENERAL:  Alert & oriented; pleasant & cooperative... HEENT:  /AT, EOM-wnl, PERRLA, EACs-clear, TMs-wnl, NOSE-clear, THROAT-clear & wnl. NECK:  Supple w/ fairROM; no JVD; normal carotid impulses w/o bruits; no thyromegaly or nodules palpated; no lymphadenopathy. CHEST:  Clear to P & A; without wheezes/ rales/ or rhonchi. HEART:  Regular Rhythm; Gr 1/6 SEM w/o rubs or gallops ABDOMEN:  Soft & nontender; scar of prev surg; mod panniculus; normal bowel sounds; no organomegaly or masses detected. EXT: without  deformities, mod arthritic changes; no varicose veins/ +venous insuffic/ no edema. NEURO:  CN's intact;  no focal neuro deficits... DERM:  No lesions noted; no rash etc...  RADIOLOGY DATA:  Reviewed in the EPIC EMR & discussed w/ the patient...  LABORATORY DATA:  Reviewed in the EPIC EMR & discussed w/ the patient...   Assessment & Plan:    Episode of CP, SOB, cardiomeg, abnEKG=> Cards eval by DrHSmith...   AR/ Bronchitis>  Stable & breathing well; prev URI treated w/ ZPak & Hycodan prn...  HBP>  BP stable  on Labet 200mg -2Bid, DiltiazemCD240, & Hyzaar100-25; we decided to change the latter to Losartan100 + Lasix40...  DM>  Control is good on Metform monotherapy- continue same...  CHOL>  Stable on Simva40; same thing applies to diet efforts!  OBESITY>  We again reviewed diet + exercise prescriptions...  GI> Divertics/ Polyps>  Followed by DrJacobs and up to date w/ colon 11/10 & 1 sm polyp removed...  Hx Pancreatic serous cystadenoma removed at [facility] 2004> stable & doing well; she follows up at [facility] every other yr.  DJD>  On Etodolac & followed by DrDean...  Vit D defic>  Remains on Vit D 50K weekly...  Anemia & MGUS>  Persist anemia (Hg=10.3) & Fe is borderline low (46- 12%sat) on daily supplement by mouth (we decided to supplement w/ IV Feraheme 510mg  x2; no progression of the MGUS w/ IgG kappa prot & 0.53gm/dL of 6.04VW/UJ total (all parameters about the same as last yr)...   Patient's Medications  New Prescriptions   No medications on file  Previous Medications   ASPIRIN 81 MG TABLET    Take 81 mg by mouth daily.     DILTIAZEM (CARDIZEM CD) 240 MG 24 HR CAPSULE    TAKE 1 CAPSULE (240 MG TOTAL) BY MOUTH DAILY.   ETODOLAC (LODINE) 400 MG TABLET    Take 1 tablet (400 mg total) by mouth daily. As needed for arthritis pain   FUROSEMIDE (LASIX) 40 MG TABLET    Take 1 tablet (40 mg total) by mouth daily.   LABETALOL (NORMODYNE) 200 MG TABLET    TAKE 2 TABLETS BY MOUTH TWICE A  DAY   LOSARTAN (COZAAR) 100 MG TABLET    Take 1 tablet (100 mg total) by mouth daily.   METFORMIN (GLUCOPHAGE) 500 MG TABLET    TAKE 1 TABLET BY MOUTH TWICE A DAY   SIMVASTATIN (ZOCOR) 40 MG TABLET    TAKE 1 TABLET (40 MG TOTAL) BY MOUTH AT BEDTIME.   VITAMIN D, ERGOCALCIFEROL, (DRISDOL) 50000 UNITS CAPS    TAKE 1 CAPSULE (50,000 UNITS TOTAL) BY MOUTH ONCE A WEEK.  Modified Medications   Modified Medication Previous Medication   FERROUS SULFATE 325 (65 FE) MG TABLET ferrous sulfate 324 (65 FE) MG TBEC      TAKE 1 TABLET TWICE DAILY WITH 500 MG VITAMIN C    TAKE 1 TABLET TWICE DAILY WITH 500 MG VITAMIN C  Discontinued Medications   No medications on file

## 2013-02-16 ENCOUNTER — Other Ambulatory Visit: Payer: Self-pay | Admitting: Pulmonary Disease

## 2013-02-17 ENCOUNTER — Telehealth: Payer: Self-pay

## 2013-02-17 MED ORDER — ISOSORBIDE MONONITRATE ER 60 MG PO TB24: 60.0000 mg | ORAL_TABLET | Freq: Every day | ORAL | Status: DC

## 2013-02-17 MED ORDER — NITROGLYCERIN 0.4 MG SL SUBL: 0.4000 mg | SUBLINGUAL_TABLET | SUBLINGUAL | Status: DC | PRN

## 2013-02-17 NOTE — Telephone Encounter (Signed)
Message copied by Jarvis Newcomer on Tue Feb 17, 2013  8:55 AM ------      Message from: Verdis Prime      Created: Mon Feb 16, 2013  6:19 PM       The nuclear study is abnormal, demonstrating decreased blood flow to the bottom wall of the heart. Start isosorbide mononitrate 60 mg per day. Office appointment in 3-4 weeks for followup. If symptoms worsen come in prior to that time. Baby aspirin 81 mg daily. Nitroglycerin 0.4 mg sublingually to be used if any recurrence of chest pain that lasts longer than 3-5 minutes. Will need instruction on how to use. ------

## 2013-02-17 NOTE — Telephone Encounter (Signed)
Pt given nuclear results. The nuclear study is abnormal, demonstrating decreased blood flow to the bottom wall of the heart. Start isosorbide mononitrate 60 mg per day. Baby aspirin 81 mg daily. Nitroglycerin 0.4 mg sublingually to be used if any recurrence of chest pain that lasts longer than 3-5 minutes. Pt instructed how to use nitro.pt appt made for 03/12/13 @4 :15pm.pt instructed to call the office if symptoms worsen or increase.pt agreeable with plan and verbalized understanding.

## 2013-03-12 ENCOUNTER — Other Ambulatory Visit (INDEPENDENT_AMBULATORY_CARE_PROVIDER_SITE_OTHER): Payer: Medicare Other

## 2013-03-12 ENCOUNTER — Ambulatory Visit (INDEPENDENT_AMBULATORY_CARE_PROVIDER_SITE_OTHER): Payer: Medicare Other | Admitting: Interventional Cardiology

## 2013-03-12 ENCOUNTER — Encounter: Payer: Self-pay | Admitting: Interventional Cardiology

## 2013-03-12 VITALS — BP 130/90 | HR 95 | Ht 62.0 in | Wt 207.0 lb

## 2013-03-12 DIAGNOSIS — R9430 Abnormal result of cardiovascular function study, unspecified: Secondary | ICD-10-CM | POA: Insufficient documentation

## 2013-03-12 DIAGNOSIS — I5032 Chronic diastolic (congestive) heart failure: Secondary | ICD-10-CM

## 2013-03-12 DIAGNOSIS — I10 Essential (primary) hypertension: Secondary | ICD-10-CM

## 2013-03-12 DIAGNOSIS — D649 Anemia, unspecified: Secondary | ICD-10-CM

## 2013-03-12 DIAGNOSIS — I2510 Atherosclerotic heart disease of native coronary artery without angina pectoris: Secondary | ICD-10-CM

## 2013-03-12 LAB — CBC WITH DIFFERENTIAL/PLATELET
BASOS ABS: 0.1 10*3/uL (ref 0.0–0.1)
Basophils Relative: 1 % (ref 0.0–3.0)
EOS PCT: 4 % (ref 0.0–5.0)
Eosinophils Absolute: 0.2 10*3/uL (ref 0.0–0.7)
HCT: 30.2 % — ABNORMAL LOW (ref 36.0–46.0)
Hemoglobin: 10 g/dL — ABNORMAL LOW (ref 12.0–15.0)
LYMPHS PCT: 19.5 % (ref 12.0–46.0)
Lymphs Abs: 1.2 10*3/uL (ref 0.7–4.0)
MCHC: 33.1 g/dL (ref 30.0–36.0)
MCV: 79.8 fl (ref 78.0–100.0)
MONOS PCT: 9.7 % (ref 3.0–12.0)
Monocytes Absolute: 0.6 10*3/uL (ref 0.1–1.0)
NEUTROS ABS: 4 10*3/uL (ref 1.4–7.7)
Neutrophils Relative %: 65.8 % (ref 43.0–77.0)
Platelets: 411 10*3/uL — ABNORMAL HIGH (ref 150.0–400.0)
RBC: 3.79 Mil/uL — ABNORMAL LOW (ref 3.87–5.11)
RDW: 18 % — ABNORMAL HIGH (ref 11.5–14.6)
WBC: 6.1 10*3/uL (ref 4.5–10.5)

## 2013-03-12 LAB — BASIC METABOLIC PANEL
BUN: 29 mg/dL — ABNORMAL HIGH (ref 6–23)
CALCIUM: 9.4 mg/dL (ref 8.4–10.5)
CO2: 23 mEq/L (ref 19–32)
Chloride: 106 mEq/L (ref 96–112)
Creatinine, Ser: 1.4 mg/dL — ABNORMAL HIGH (ref 0.4–1.2)
GFR: 47.63 mL/min — AB (ref >60.00)
Glucose, Bld: 124 mg/dL — ABNORMAL HIGH (ref 70–99)
Potassium: 4.8 mEq/L (ref 3.5–5.1)
SODIUM: 138 meq/L (ref 135–145)

## 2013-03-12 LAB — IBC PANEL
IRON: 56 ug/dL (ref 42–145)
SATURATION RATIOS: 17.4 % — AB (ref 20.0–50.0)
Transferrin: 230.3 mg/dL (ref 212.0–360.0)

## 2013-03-12 NOTE — Patient Instructions (Signed)
Your physician recommends that you continue on your current medications as directed. Please refer to the Current Medication list given to you today.  You have a follow up appointment scheduled for 04/22/13 @ 10am  Call the office if you are experiencing increased chest pain/shortness of breath with activity [phone]

## 2013-03-12 NOTE — Progress Notes (Signed)
Patient ID: Wendy Kerr, female   DOB: May 18, 1930, 80 y.o.   MRN: [id number]    [address], [address], [place]  [address] Phone: [phone] Fax:  [phone]  Date:  03/12/2013   ID:  Wendy Kerr, DOB 29-Jun-1930, MRN [id number]  PCP:  Noralee Space, MD   ASSESSMENT:  1. Coronary artery disease with angina. Low to intermediate probability nuclear study with inferior ischemia. Symptoms improved on isosorbide. 2. Chronic diastolic heart failure, improved as anemia improved 3. Hypertension, controlled 4. Diabetes mellitus  PLAN:  1. Continue isosorbide mononitrate 30 mg daily 2. Use sublingual nitroglycerin if prolonged chest discomfort or dyspnea 3. Report any worsening in symptoms. If symptoms worsen the patient will need to have Coronary Angiography. At this time she feels comfortable with a conservative approach and she feels improved. 4. Return to see me in 6 weeks   SUBJECTIVE: Wendy Kerr is a 80 y.o. female is back for reconsideration after cardiac evaluation. Echocardiography and nuclear scintigraphy demonstrated low normal LV function with EF of around 50%. The nuclear study demonstrated a small to moderate region of inferior ischemia. No ischemic dilatation was noted. Because of this finding, isosorbide mononitrate 30 mg per day has been started. The patient is also being treated for anemia which has gradually gotten better since I first saw her. Overall she feels much stronger. She's had no recurrence of chest discomfort. She is only had one episode of chest pain which occurred in late November and led to an emergency room evaluation without evidence of infarction or acute EKG changes. Since the initial office visit, she has not had chest discomfort and feels that she is improving. She has had low-grade headache associated with isosorbide use.   Wt Readings from Last 3 Encounters:  03/12/13 207 lb (93.895 kg)  02/11/13 202 lb (91.627 kg)  02/09/13  205 lb 3.2 oz (93.078 kg)     Past Medical History  Diagnosis Date  . Bronchitis, mucopurulent recurrent   . Hypertension   . Diabetes mellitus   . Hypercholesterolemia   . Obesity   . Diverticulosis of colon   . Benign neoplasm of pancreas, except islets of Langerhans   . DJD (degenerative joint disease)   . Vitamin D deficiency   . Common migraine   . Generalized anxiety disorder   . Anemia   . Monoclonal gammopathy     Current Outpatient Prescriptions  Medication Sig Dispense Refill  . aspirin 81 MG tablet Take 81 mg by mouth daily.        Marland Kitchen diltiazem (CARDIZEM CD) 240 MG 24 hr capsule TAKE 1 CAPSULE (240 MG TOTAL) BY MOUTH DAILY.  30 capsule  10  . ferrous sulfate 325 (65 FE) MG tablet TAKE 1 TABLET TWICE DAILY WITH 500 MG VITAMIN C  60 tablet  3  . furosemide (LASIX) 40 MG tablet Take 1 tablet (40 mg total) by mouth daily.  30 tablet  11  . isosorbide mononitrate (IMDUR) 60 MG 24 hr tablet Take 1 tablet (60 mg total) by mouth daily.  30 tablet  11  . labetalol (NORMODYNE) 200 MG tablet TAKE 2 TABLETS BY MOUTH TWICE A DAY  120 tablet  10  . losartan (COZAAR) 100 MG tablet Take 1 tablet (100 mg total) by mouth daily.  30 tablet  11  . metFORMIN (GLUCOPHAGE) 500 MG tablet TAKE 1 TABLET BY MOUTH TWICE A DAY  60 tablet  11  . nitroGLYCERIN (NITROSTAT)  0.4 MG SL tablet Place 1 tablet (0.4 mg total) under the tongue every 5 (five) minutes as needed for chest pain.  25 tablet  3  . simvastatin (ZOCOR) 40 MG tablet TAKE 1 TABLET (40 MG TOTAL) BY MOUTH AT BEDTIME.  30 tablet  11  . Vitamin D, Ergocalciferol, (DRISDOL) 50000 UNITS CAPS TAKE 1 CAPSULE (50,000 UNITS TOTAL) BY MOUTH ONCE A WEEK.  4 capsule  8   No current facility-administered medications for this visit.    Allergies:    Allergies  Allergen Reactions  . Piroxicam     REACTION: HEADACHE    Social History:  The patient  reports that she has never smoked. She has never used smokeless tobacco. She reports that she does  not drink alcohol or use illicit drugs.   ROS:  Please see the history of present illness. Low grade headaches related to isosorbide. These are different than her cluster headaches. No orthopnea, PND, palpitations, dizziness, or syncope. All other systems reviewed and negative.   OBJECTIVE: VS:  BP 130/90  Pulse 95  Ht 5\' 2"  (1.575 m)  Wt 207 lb (93.895 kg)  BMI 37.85 kg/m2 Well nourished, well developed, in no acute distress HEENT: normal Neck: JVD flat. Carotid bruit absent  Cardiac:  normal S1, S2; RRR; no murmur Lungs:  clear to auscultation bilaterally, no wheezing, rhonchi or rales Abd: soft, nontender, no hepatomegaly Ext: Edema absent. Pulses 2+ Skin: warm and dry Neuro:  CNs 2-12 intact, no focal abnormalities noted  EKG:  Not repeated       Signed, Illene Labrador III, MD 03/12/2013 4:38 PM

## 2013-03-18 ENCOUNTER — Other Ambulatory Visit: Payer: Self-pay | Admitting: Pulmonary Disease

## 2013-03-18 DIAGNOSIS — D649 Anemia, unspecified: Secondary | ICD-10-CM

## 2013-03-18 MED ORDER — FERUMOXYTOL INJECTION 510 MG/17 ML: 510.0000 mg | Freq: Once | INTRAVENOUS | Status: DC

## 2013-03-20 ENCOUNTER — Encounter (HOSPITAL_COMMUNITY)
Admission: RE | Admit: 2013-03-20 | Discharge: 2013-03-20 | Disposition: A | Discharge status: Home / Self Care | Payer: Medicare Other | Source: Ambulatory Visit | Attending: Pulmonary Disease | Admitting: Pulmonary Disease

## 2013-03-20 ENCOUNTER — Other Ambulatory Visit (HOSPITAL_COMMUNITY): Payer: Self-pay | Admitting: Pulmonary Disease

## 2013-03-20 DIAGNOSIS — D649 Anemia, unspecified: Secondary | ICD-10-CM | POA: Insufficient documentation

## 2013-03-20 MED ORDER — SODIUM CHLORIDE 0.9 % IV SOLN
Freq: Once | INTRAVENOUS | Status: AC
  Administered 2013-03-20: 15:00:00 via INTRAVENOUS

## 2013-03-20 MED ORDER — FERUMOXYTOL INJECTION 510 MG/17 ML
510.0000 mg | Freq: Once | INTRAVENOUS | Status: AC
  Administered 2013-03-20: 510 mg via INTRAVENOUS
  Filled 2013-03-20: qty 17

## 2013-03-20 NOTE — Discharge Instructions (Signed)
FERAHEME Ferumoxytol injection What is this medicine? FERUMOXYTOL is an iron complex. Iron is used to make healthy red blood cells, which carry oxygen and nutrients throughout the body. This medicine is used to treat iron deficiency anemia in people with chronic kidney disease. This medicine may be used for other purposes; ask your health care provider or pharmacist if you have questions. COMMON BRAND NAME(S): Feraheme  What should I tell my health care provider before I take this medicine? They need to know if you have any of these conditions: -anemia not caused by low iron levels -high levels of iron in the blood -magnetic resonance imaging (MRI) test scheduled -an unusual or allergic reaction to iron, other medicines, foods, dyes, or preservatives -pregnant or trying to get pregnant -breast-feeding How should I use this medicine? This medicine is for injection into a vein. It is given by a health care professional in a hospital or clinic setting. Talk to your pediatrician regarding the use of this medicine in children. Special care may be needed. Overdosage: If you think you've taken too much of this medicine contact a poison control center or emergency room at once. Overdosage: If you think you have taken too much of this medicine contact a poison control center or emergency room at once. NOTE: This medicine is only for you. Do not share this medicine with others. What if I miss a dose? It is important not to miss your dose. Call your doctor or health care professional if you are unable to keep an appointment. What may interact with this medicine? This medicine may interact with the following medications: -other iron products This list may not describe all possible interactions. Give your health care provider a list of all the medicines, herbs, non-prescription drugs, or dietary supplements you use. Also tell them if you smoke, drink alcohol, or use illegal drugs. Some items may  interact with your medicine. What should I watch for while using this medicine? Visit your doctor or healthcare professional regularly. Tell your doctor or healthcare professional if your symptoms do not start to get better or if they get worse. You may need blood work done while you are taking this medicine. You may need to follow a special diet. Talk to your doctor. Foods that contain iron include: whole grains/cereals, dried fruits, beans, or peas, leafy green vegetables, and organ meats (liver, kidney). What side effects may I notice from receiving this medicine? Side effects that you should report to your doctor or health care professional as soon as possible: -allergic reactions like skin rash, itching or hives, swelling of the face, lips, or tongue -breathing problems -changes in blood pressure -feeling faint or lightheaded, falls -fever or chills -flushing, sweating, or hot feelings -swelling of the ankles or feet Side effects that usually do not require medical attention (Report these to your doctor or health care professional if they continue or are bothersome.): -diarrhea -headache -nausea, vomiting -stomach pain This list may not describe all possible side effects. Call your doctor for medical advice about side effects. You may report side effects to FDA at 1-800-FDA-1088. Where should I keep my medicine? This drug is given in a hospital or clinic and will not be stored at home. NOTE: This sheet is a summary. It may not cover all possible information. If you have questions about this medicine, talk to your doctor, pharmacist, or health care provider.  2014, Elsevier/Gold Standard. (2011-09-28 15:23:36)

## 2013-03-20 NOTE — Progress Notes (Signed)
Uneventful infusion of FERAHEME 510 mg IV #1/2. Next appointment is scheduled for 03/25/13.

## 2013-03-25 ENCOUNTER — Other Ambulatory Visit (HOSPITAL_COMMUNITY): Payer: Self-pay | Admitting: Pulmonary Disease

## 2013-03-25 ENCOUNTER — Encounter (HOSPITAL_COMMUNITY)
Admission: RE | Admit: 2013-03-25 | Discharge: 2013-03-25 | Disposition: A | Discharge status: Home / Self Care | Payer: Medicare Other | Source: Ambulatory Visit | Attending: Pulmonary Disease | Admitting: Pulmonary Disease

## 2013-03-25 ENCOUNTER — Encounter (HOSPITAL_COMMUNITY): Payer: Self-pay

## 2013-03-25 MED ORDER — FERUMOXYTOL INJECTION 510 MG/17 ML
510.0000 mg | Freq: Once | INTRAVENOUS | Status: AC
  Administered 2013-03-25: 510 mg via INTRAVENOUS
  Filled 2013-03-25: qty 17

## 2013-03-25 MED ORDER — SODIUM CHLORIDE 0.9 % IV SOLN
Freq: Once | INTRAVENOUS | Status: AC
Start: 2013-03-25 — End: 2013-03-25
  Administered 2013-03-25: 15:00:00 via INTRAVENOUS

## 2013-04-08 ENCOUNTER — Other Ambulatory Visit: Payer: Self-pay | Admitting: Pulmonary Disease

## 2013-04-20 ENCOUNTER — Telehealth: Payer: Self-pay | Admitting: Pulmonary Disease

## 2013-04-20 MED ORDER — AZITHROMYCIN 250 MG PO TABS: ORAL_TABLET | ORAL | Status: DC

## 2013-04-20 NOTE — Telephone Encounter (Signed)
Per SN---  zpak #1  Take as directed mucinex 2 po bid  600 mg Increase fluids Will need OV next week if not any better.

## 2013-04-20 NOTE — Telephone Encounter (Signed)
ATC line busy x 3 wcb 

## 2013-04-20 NOTE — Telephone Encounter (Signed)
Called spoke with pt. Aware of recs. rx sent for pt. Nothing further needed

## 2013-04-20 NOTE — Telephone Encounter (Signed)
Last OV 02/09/13. Pt is c/o having dry cough x 3 weeks. She states she now has wheezing, sinus congestion, chest congestion x 1 week. Pt has not tried anything OTC at this time. Please advise.  [place] Bing, CMA Allergies  Allergen Reactions  . Piroxicam     REACTION: HEADACHE

## 2013-04-22 ENCOUNTER — Ambulatory Visit: Payer: Medicare Other | Admitting: Interventional Cardiology

## 2013-04-24 ENCOUNTER — Encounter (HOSPITAL_COMMUNITY): Payer: Self-pay | Admitting: Emergency Medicine

## 2013-04-24 ENCOUNTER — Emergency Department (HOSPITAL_COMMUNITY)
Admission: EM | Admit: 2013-04-24 | Discharge: 2013-04-25 | Disposition: A | Discharge status: Home / Self Care | Payer: Medicare Other | Attending: Emergency Medicine | Admitting: Emergency Medicine

## 2013-04-24 ENCOUNTER — Emergency Department (HOSPITAL_COMMUNITY): Payer: Medicare Other

## 2013-04-24 DIAGNOSIS — E119 Type 2 diabetes mellitus without complications: Secondary | ICD-10-CM | POA: Insufficient documentation

## 2013-04-24 DIAGNOSIS — E559 Vitamin D deficiency, unspecified: Secondary | ICD-10-CM | POA: Insufficient documentation

## 2013-04-24 DIAGNOSIS — Z792 Long term (current) use of antibiotics: Secondary | ICD-10-CM | POA: Insufficient documentation

## 2013-04-24 DIAGNOSIS — E7800 Pure hypercholesterolemia, unspecified: Secondary | ICD-10-CM | POA: Insufficient documentation

## 2013-04-24 DIAGNOSIS — E669 Obesity, unspecified: Secondary | ICD-10-CM | POA: Insufficient documentation

## 2013-04-24 DIAGNOSIS — Z8719 Personal history of other diseases of the digestive system: Secondary | ICD-10-CM | POA: Insufficient documentation

## 2013-04-24 DIAGNOSIS — Z79899 Other long term (current) drug therapy: Secondary | ICD-10-CM | POA: Insufficient documentation

## 2013-04-24 DIAGNOSIS — M1990 Unspecified osteoarthritis, unspecified site: Secondary | ICD-10-CM | POA: Insufficient documentation

## 2013-04-24 DIAGNOSIS — R079 Chest pain, unspecified: Secondary | ICD-10-CM

## 2013-04-24 DIAGNOSIS — Z8709 Personal history of other diseases of the respiratory system: Secondary | ICD-10-CM | POA: Insufficient documentation

## 2013-04-24 DIAGNOSIS — Z7982 Long term (current) use of aspirin: Secondary | ICD-10-CM | POA: Insufficient documentation

## 2013-04-24 DIAGNOSIS — R799 Abnormal finding of blood chemistry, unspecified: Secondary | ICD-10-CM | POA: Insufficient documentation

## 2013-04-24 DIAGNOSIS — N289 Disorder of kidney and ureter, unspecified: Secondary | ICD-10-CM | POA: Insufficient documentation

## 2013-04-24 DIAGNOSIS — I10 Essential (primary) hypertension: Secondary | ICD-10-CM | POA: Insufficient documentation

## 2013-04-24 DIAGNOSIS — D649 Anemia, unspecified: Secondary | ICD-10-CM | POA: Insufficient documentation

## 2013-04-24 DIAGNOSIS — R7989 Other specified abnormal findings of blood chemistry: Secondary | ICD-10-CM

## 2013-04-24 DIAGNOSIS — Z8659 Personal history of other mental and behavioral disorders: Secondary | ICD-10-CM | POA: Insufficient documentation

## 2013-04-24 LAB — BASIC METABOLIC PANEL
BUN: 24 mg/dL — ABNORMAL HIGH (ref 6–23)
CO2: 28 meq/L (ref 19–32)
Calcium: 9.6 mg/dL (ref 8.4–10.5)
Chloride: 98 mEq/L (ref 96–112)
Creatinine, Ser: 1.37 mg/dL — ABNORMAL HIGH (ref 0.50–1.10)
GFR calc non Af Amer: 35 mL/min — ABNORMAL LOW (ref >90)
GFR, EST AFRICAN AMERICAN: 41 mL/min — AB (ref >90)
Glucose, Bld: 129 mg/dL — ABNORMAL HIGH (ref 70–99)
POTASSIUM: 4.6 meq/L (ref 3.7–5.3)
SODIUM: 140 meq/L (ref 137–147)

## 2013-04-24 LAB — CBC
HEMATOCRIT: 28.3 % — AB (ref 36.0–46.0)
HEMOGLOBIN: 9.4 g/dL — AB (ref 12.0–15.0)
MCH: 26.7 pg (ref 26.0–34.0)
MCHC: 33.2 g/dL (ref 30.0–36.0)
MCV: 80.4 fL (ref 78.0–100.0)
Platelets: 357 10*3/uL (ref 150–400)
RBC: 3.52 MIL/uL — AB (ref 3.87–5.11)
RDW: 16.9 % — ABNORMAL HIGH (ref 11.5–15.5)
WBC: 8.1 10*3/uL (ref 4.0–10.5)

## 2013-04-24 LAB — I-STAT TROPONIN, ED: TROPONIN I, POC: 0.09 ng/mL — AB (ref 0.00–0.08)

## 2013-04-24 LAB — PRO B NATRIURETIC PEPTIDE: Pro B Natriuretic peptide (BNP): 9487 pg/mL — ABNORMAL HIGH (ref 0–450)

## 2013-04-24 IMAGING — CR DG CHEST 2V
2 series · 2 of 2 positions shown · non-contrast
Comparison: None.

CLINICAL DATA: Cough.  Nonsmoker.

CHEST - 2 VIEW

[view not recorded (1 of 2)]
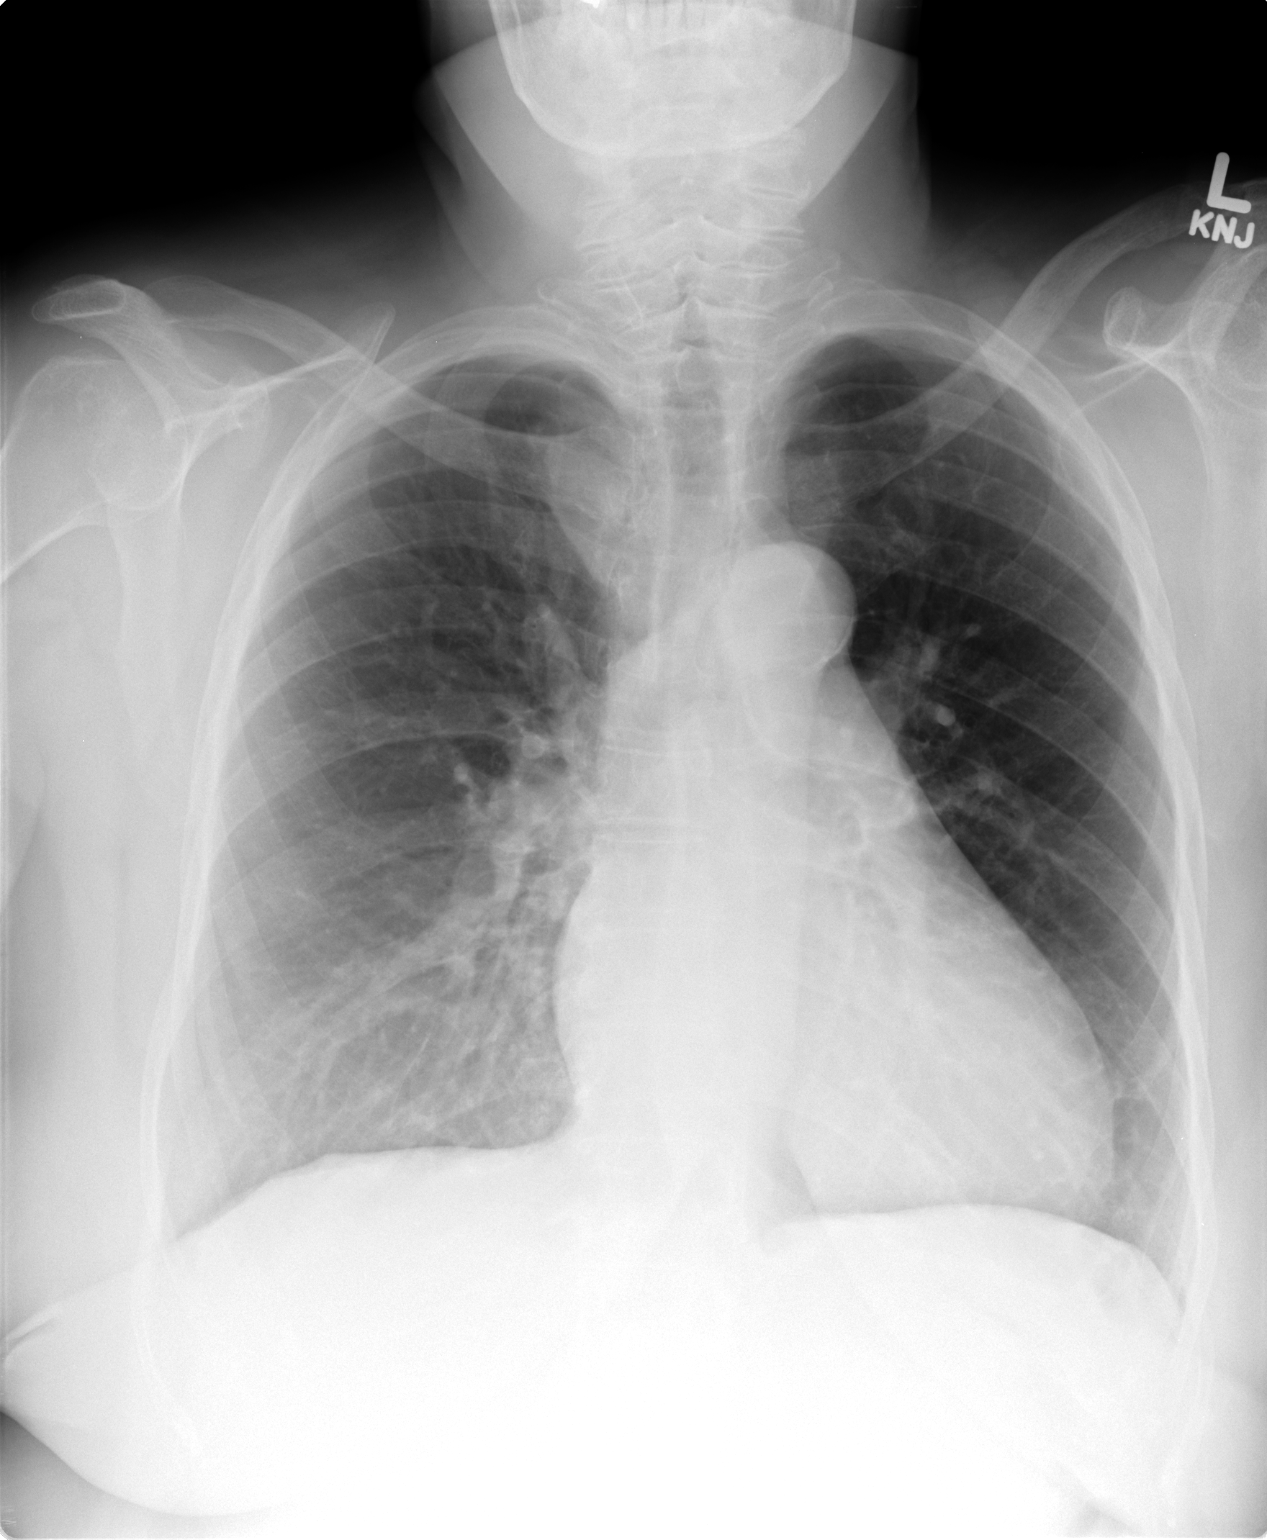

[view not recorded (2 of 2)]
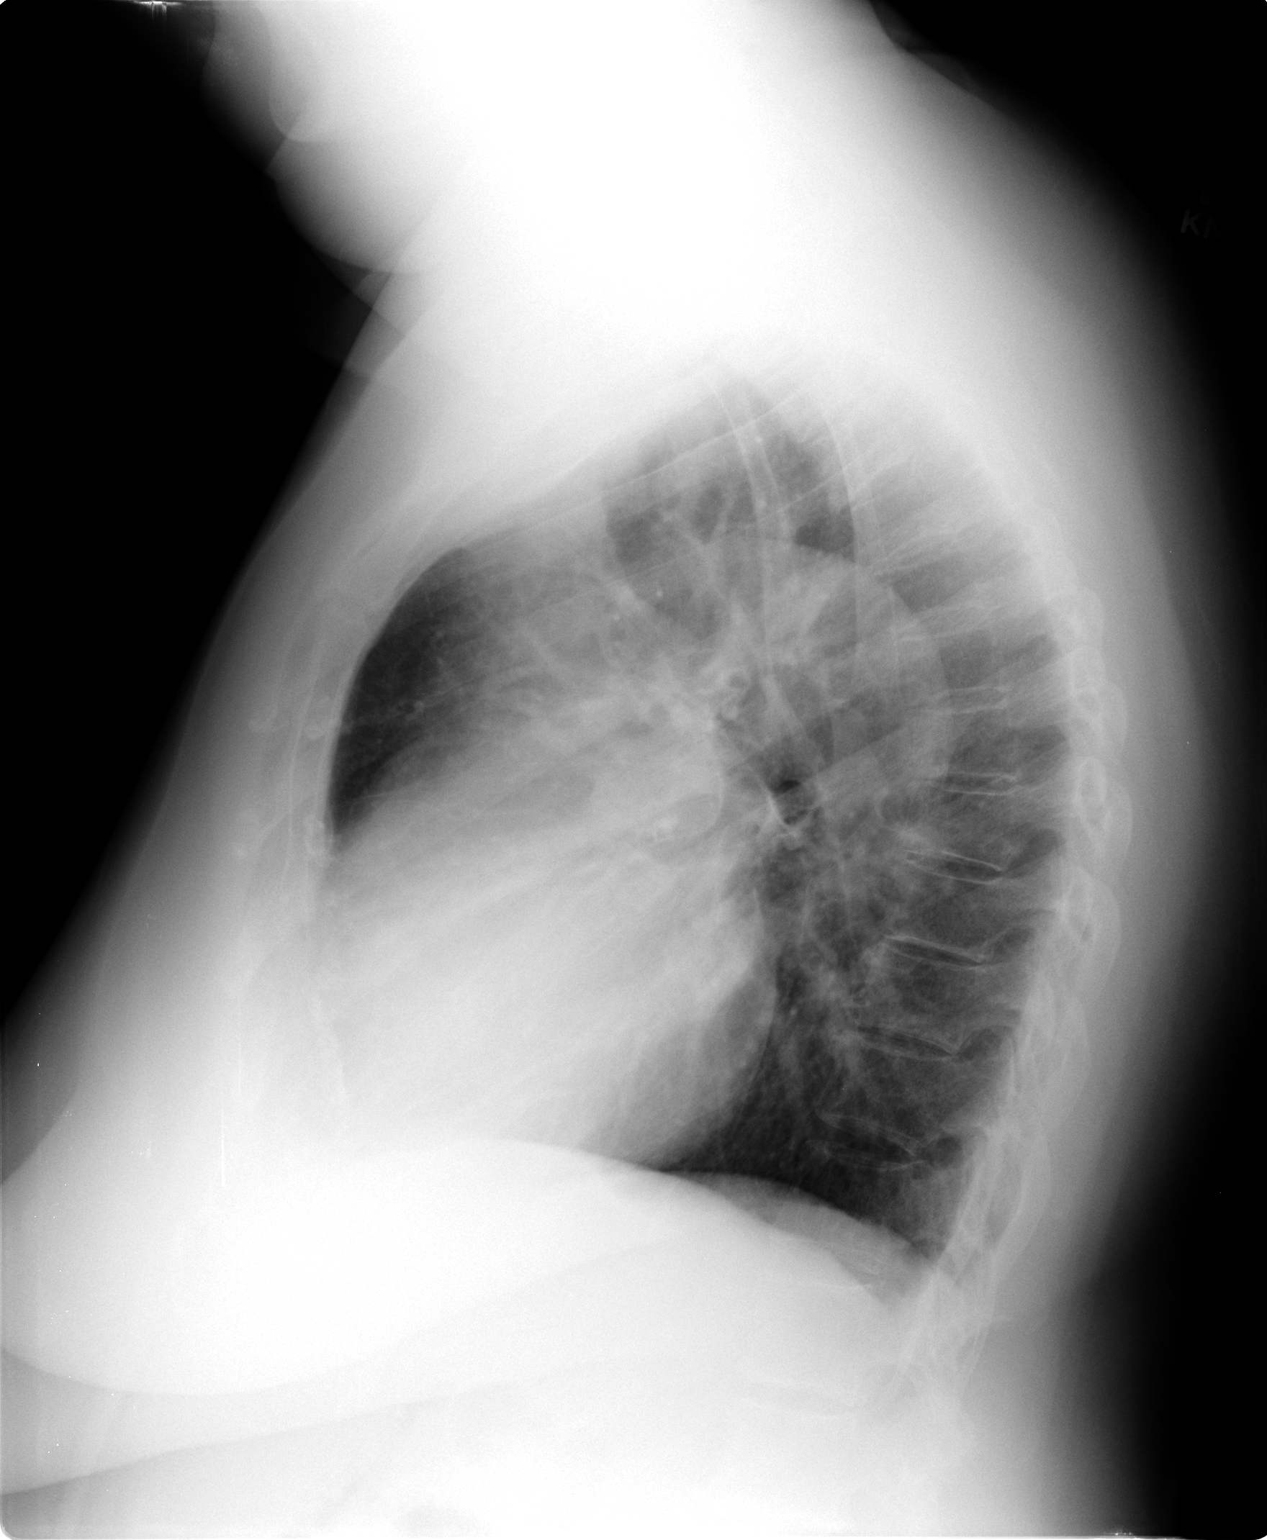

[2 of 2 positions shown; findings below may reference images not displayed]

FINDINGS: Two views of the chest were obtained.  Prominent density
in the right paratracheal region possibly related to vascular
structures and/or sternum. Otherwise, there is a normal appearance
of the heart and mediastinum.  Trachea is midline.  Lungs are
clear.
IMPRESSION: No acute chest findings.

Indeterminate density in the right paratracheal region.  This could
represent vascular or bony structures.  This area could be
evaluated with a 3-month follow-up or more definitive evaluation
with a chest CT.

## 2013-04-24 NOTE — ED Notes (Signed)
Positive I stat Troponin - No doctor has signed up, Dr Tomi Bamberger is aware

## 2013-04-24 NOTE — Telephone Encounter (Signed)
ATC NA WCB and no VM

## 2013-04-24 NOTE — ED Notes (Signed)
Pt reports epigastric pain that began this afternoon that she reports as burning and states "it feels like gastric reflux." Son states that the patient has a history of cardiac blockages, which she has been seen for in the past. Pt endorses slight shortness of breath, however denies emesis, nausea, radiating pain, or back pain. Pt is A/O x4, in NAD, oxygen saturation of 98% on room air.

## 2013-04-24 NOTE — ED Provider Notes (Signed)
CSN: [id number]     Arrival date & time 04/24/13  2002 History   First MD Initiated Contact with Patient 04/24/13 2306     Chief Complaint  Patient presents with  . Chest Pain     (Consider location/radiation/quality/duration/timing/severity/associated sxs/prior Treatment) Patient is a 80 y.o. female presenting with chest pain. The history is provided by the patient.  Chest Pain She had an episode about 4 PM of an epigastric burning pain which she states felt like her acid reflux. It lasted a few seconds before going date. She had another episode about 7 PM which also lasted only a few seconds only. Both these episodes were at rest. There is no radiation of pain. She denies dyspnea, nausea, vomiting, diaphoresis. She has had no episodes of pain since the episode at 7 PM. Of note, she did recently have a Myoview stress test which showed and potential area of ischemia and has been placed on isosorbide mononitrate.  Past Medical History  Diagnosis Date  . Bronchitis, mucopurulent recurrent   . Hypertension   . Diabetes mellitus   . Hypercholesterolemia   . Obesity   . Diverticulosis of colon   . Benign neoplasm of pancreas, except islets of Langerhans   . DJD (degenerative joint disease)   . Vitamin D deficiency   . Common migraine   . Generalized anxiety disorder   . Anemia   . Monoclonal gammopathy    Past Surgical History  Procedure Laterality Date  . Resection serous cystadenoma of pancreas  2004    at [facility]  . Abdominal hysterectomy     Family History  Problem Relation Age of Onset  . Hypertension Mother   . Kidney failure Mother   . Hypertension Father   . Kidney failure Father    History  Substance Use Topics  . Smoking status: Never Smoker   . Smokeless tobacco: Never Used  . Alcohol Use: No   OB History   Grav Para Term Preterm Abortions TAB SAB Ect Mult Living                 Review of Systems  Cardiovascular: Positive for chest pain.  All other systems  reviewed and are negative.      Allergies  Piroxicam  Home Medications   Current Outpatient Rx  Name  Route  Sig  Dispense  Refill  . aspirin 81 MG tablet   Oral   Take 81 mg by mouth every evening.          . diltiazem (CARDIZEM CD) 240 MG 24 hr capsule   Oral   Take 240 mg by mouth every evening.          . ferrous sulfate 325 (65 FE) MG tablet   Oral   Take 325 mg by mouth 2 (two) times daily with a meal.         . furosemide (LASIX) 40 MG tablet   Oral   Take 40 mg by mouth every morning.         . Iron-Vitamins (GERITOL TONIC PO)   Oral   Take 5 mLs by mouth daily as needed (energy).         . isosorbide mononitrate (IMDUR) 60 MG 24 hr tablet   Oral   Take 60 mg by mouth every morning.         . labetalol (NORMODYNE) 200 MG tablet   Oral   Take 400 mg by mouth 2 (two) times daily.         Marland Kitchen  losartan (COZAAR) 100 MG tablet   Oral   Take 100 mg by mouth every morning.         . metFORMIN (GLUCOPHAGE) 500 MG tablet   Oral   Take 500 mg by mouth 2 (two) times daily with a meal.         . Multiple Vitamin (MULTIVITAMIN WITH MINERALS) TABS tablet   Oral   Take 1 tablet by mouth every morning.         . simvastatin (ZOCOR) 40 MG tablet   Oral   Take 40 mg by mouth every evening.         . Vitamin D, Ergocalciferol, (DRISDOL) 50000 UNITS CAPS capsule   Oral   Take 50,000 Units by mouth every 7 (seven) days. On Wednesdays         . azithromycin (ZITHROMAX) 250 MG tablet   Oral   Take 250-500 mg by mouth daily. Take 2 tablets on day 1 and take 1 tablet daily on days 2-5         . nitroGLYCERIN (NITROSTAT) 0.4 MG SL tablet   Sublingual   Place 1 tablet (0.4 mg total) under the tongue every 5 (five) minutes as needed for chest pain.   25 tablet   3     Up to 3 tablets. If chest pain does not subside pl ...    BP 156/83  Pulse 95  Temp(Src) 98.3 F (36.8 C) (Oral)  Resp 18  SpO2 98% Physical Exam  Nursing note and  vitals reviewed.  80 year old female, resting comfortably and in no acute distress. Vital signs are significant for hypertension with blood pressure 156/83. Oxygen saturation is 98%, which is normal. Head is normocephalic and atraumatic. PERRLA, EOMI. Oropharynx is clear. Neck is nontender and supple without adenopathy or JVD. Back is nontender and there is no CVA tenderness. Lungs are clear without rales, wheezes, or rhonchi. Chest is nontender. Heart has regular rate and rhythm without murmur. Abdomen is soft, flat, nontender without masses or hepatosplenomegaly and peristalsis is normoactive. Extremities have no cyanosis or edema, full range of motion is present. Skin is warm and dry without rash. Neurologic: Mental status is normal, cranial nerves are intact, there are no motor or sensory deficits.  ED Course  Procedures (including critical care time) Labs Review Labs Reviewed  BASIC METABOLIC PANEL - Abnormal; Notable for the following:    Glucose, Bld 129 (*)    BUN 24 (*)    Creatinine, Ser 1.37 (*)    GFR calc non Af Amer 35 (*)    GFR calc Af Amer 41 (*)    All other components within normal limits  CBC - Abnormal; Notable for the following:    RBC 3.52 (*)    Hemoglobin 9.4 (*)    HCT 28.3 (*)    RDW 16.9 (*)    All other components within normal limits  PRO B NATRIURETIC PEPTIDE - Abnormal; Notable for the following:    Pro B Natriuretic peptide (BNP) 9487.0 (*)    All other components within normal limits  I-STAT TROPOININ, ED - Abnormal; Notable for the following:    Troponin i, poc 0.09 (*)    All other components within normal limits   Imaging Review Dg Chest 2 View  04/24/2013   CLINICAL DATA:  Hypertension, umbilical pain  EXAM: CHEST  2 VIEW  COMPARISON:  01/03/2013  FINDINGS: Cardiomegaly. Mild elevation of the right hemidiaphragm. Central mild vascular congestion without  convincing pulmonary edema. No segmental infiltrate. Bony thorax is stable.   IMPRESSION: Cardiomegaly. Central vascular congestion without convincing pulmonary edema. No focal infiltrate.   Electronically Signed   By: Lahoma Crocker M.D.   On: 04/24/2013 20:50     EKG Interpretation   Date/Time:  Friday April 24 2013 20:17:38 EST Ventricular Rate:  88 PR Interval:  188 QRS Duration: 134 QT Interval:  458 QTC Calculation: 554 R Axis:   -82 Text Interpretation:  Normal sinus rhythm Right bundle branch block Left  anterior fascicular block - Bifascicular block - Abnormal ECG When  compared with ECG of 01/03/2013,  No significant change was found Confirmed  by [facility]  MD, Hosie Sharman ([id number]) on 04/24/2013 11:07:19 PM      MDM   Final diagnoses:  Chest pain  Renal insufficiency  Elevated brain natriuretic peptide (BNP) level    Epigastric pain and are unlikely to be cardiac in origin. ECG is unchanged from baseline. Point-of-care troponin is minimally elevated and will need to be confirmed with a troponin in the main lab. Anemia, renal insufficiency, and elevated BNP are all at about her baseline levels. She did have her normal dose of aspirin this morning.  Name labs troponin is normal. Patient is discharged to follow up with her PCP and with her cardiologist.  Delora Fuel, MD [id number]

## 2013-04-25 LAB — TROPONIN I: Troponin I: <0.3 ng/mL (ref <0.30)

## 2013-04-25 NOTE — Discharge Instructions (Signed)
Chest Pain (Nonspecific) °It is often hard to give a specific diagnosis for the cause of chest pain. There is always a chance that your pain could be related to something serious, such as a heart attack or a blood clot in the lungs. You need to follow up with your caregiver for further evaluation. °CAUSES  °· Heartburn. °· Pneumonia or bronchitis. °· Anxiety or stress. °· Inflammation around your heart (pericarditis) or lung (pleuritis or pleurisy). °· A blood clot in the lung. °· A collapsed lung (pneumothorax). It can develop suddenly on its own (spontaneous pneumothorax) or from injury (trauma) to the chest. °· Shingles infection (herpes zoster virus). °The chest wall is composed of bones, muscles, and cartilage. Any of these can be the source of the pain. °· The bones can be bruised by injury. °· The muscles or cartilage can be strained by coughing or overwork. °· The cartilage can be affected by inflammation and become sore (costochondritis). °DIAGNOSIS  °Lab tests or other studies, such as X-rays, electrocardiography, stress testing, or cardiac imaging, may be needed to find the cause of your pain.  °TREATMENT  °· Treatment depends on what may be causing your chest pain. Treatment may include: °· Acid blockers for heartburn. °· Anti-inflammatory medicine. °· Pain medicine for inflammatory conditions. °· Antibiotics if an infection is present. °· You may be advised to change lifestyle habits. This includes stopping smoking and avoiding alcohol, caffeine, and chocolate. °· You may be advised to keep your head raised (elevated) when sleeping. This reduces the chance of acid going backward from your stomach into your esophagus. °· Most of the time, nonspecific chest pain will improve within 2 to 3 days with rest and mild pain medicine. °HOME CARE INSTRUCTIONS  °· If antibiotics were prescribed, take your antibiotics as directed. Finish them even if you start to feel better. °· For the next few days, avoid physical  activities that bring on chest pain. Continue physical activities as directed. °· Do not smoke. °· Avoid drinking alcohol. °· Only take over-the-counter or prescription medicine for pain, discomfort, or fever as directed by your caregiver. °· Follow your caregiver's suggestions for further testing if your chest pain does not go away. °· Keep any follow-up appointments you made. If you do not go to an appointment, you could develop lasting (chronic) problems with pain. If there is any problem keeping an appointment, you must call to reschedule. °SEEK MEDICAL CARE IF:  °· You think you are having problems from the medicine you are taking. Read your medicine instructions carefully. °· Your chest pain does not go away, even after treatment. °· You develop a rash with blisters on your chest. °SEEK IMMEDIATE MEDICAL CARE IF:  °· You have increased chest pain or pain that spreads to your arm, neck, jaw, back, or abdomen. °· You develop shortness of breath, an increasing cough, or you are coughing up blood. °· You have severe back or abdominal pain, feel nauseous, or vomit. °· You develop severe weakness, fainting, or chills. °· You have a fever. °THIS IS AN EMERGENCY. Do not wait to see if the pain will go away. Get medical help at once. Call your local emergency services (911 in [place] Do not drive yourself to the hospital. °MAKE SURE YOU:  °· Understand these instructions. °· Will watch your condition. °· Will get help right away if you are not doing well or get worse. °Document Released: 11/22/2004 Document Revised: 05/07/2011 Document Reviewed: 09/18/2007 °Exit[employer]® Patient Information ©2014 [employer]   [employer]. ° °

## 2013-04-27 NOTE — Telephone Encounter (Signed)
I spoke with the pt and she states she was wanting to know if she needed labs but she was in the ER on Friday and they did blood work. Pt wants to wait until OV on 06-09-13 for more labs. [place] Bing, CMA

## 2013-05-06 ENCOUNTER — Other Ambulatory Visit: Payer: Self-pay | Admitting: Pulmonary Disease

## 2013-05-06 DIAGNOSIS — E119 Type 2 diabetes mellitus without complications: Secondary | ICD-10-CM

## 2013-05-06 DIAGNOSIS — E559 Vitamin D deficiency, unspecified: Secondary | ICD-10-CM

## 2013-06-07 ENCOUNTER — Other Ambulatory Visit: Payer: Self-pay | Admitting: Pulmonary Disease

## 2013-06-08 ENCOUNTER — Ambulatory Visit (INDEPENDENT_AMBULATORY_CARE_PROVIDER_SITE_OTHER): Payer: Medicare Other | Admitting: Interventional Cardiology

## 2013-06-08 ENCOUNTER — Encounter: Payer: Self-pay | Admitting: Interventional Cardiology

## 2013-06-08 VITALS — BP 130/80 | HR 80 | Ht 65.0 in | Wt 203.1 lb

## 2013-06-08 DIAGNOSIS — I10 Essential (primary) hypertension: Secondary | ICD-10-CM

## 2013-06-08 DIAGNOSIS — I2510 Atherosclerotic heart disease of native coronary artery without angina pectoris: Secondary | ICD-10-CM

## 2013-06-08 DIAGNOSIS — I5032 Chronic diastolic (congestive) heart failure: Secondary | ICD-10-CM

## 2013-06-08 NOTE — Patient Instructions (Signed)
Your physician recommends that you continue on your current medications as directed. Please refer to the Current Medication list given to you today.  Your physician wants you to follow-up in: 6 months with Dr.Smith You will receive a reminder letter in the mail two months in advance. If you don't receive a letter, please call our office to schedule the follow-up appointment.  

## 2013-06-08 NOTE — Progress Notes (Signed)
Patient ID: Wendy Kerr, female   DOB: Jul 22, 1930, 80 y.o.   MRN: [id number]    [address] [place], [place]  [address] Phone: [phone] Fax:  [phone]  Date:  06/08/2013   ID:  Blaine, Guiffre 1930/06/01, MRN [id number]  PCP:  Stacy Gardner, PA-C   ASSESSMENT:  1. Coronary artery disease with low to intermediate risk nuclear perfusion study in 2014, no ischemic symptoms and stable on medical regimen  2. Hypertension, stable 3. Hyperlipidemia, stable 4. Chronic diastolic heart failure, stable  PLAN:  1. Continue isosorbide mononitrate 60 mg per day 2. Call if change in exertional tolerance or spontaneous episodes of chest discomfort 3. Clinical followup in 6 months  4. Continue active lifestyle including exercising at a still a [employer]  SUBJECTIVE: Wendy Kerr is a 80 y.o. female who is doing well on her current medical regimen. She has had no prolonged episodes of chest pain. She has had 2 episodes of "indigestion/" which is a burning sensation from the epigastrium to the throat. There is no water brash. Episodes last less than 5 minutes. She is going to the emergency room twice with this and no findings were noted. She is exercising at the [facility] without chest discomfort or heartburn. She has biliary endurance.   Wt Readings from Last 3 Encounters:  06/08/13 203 lb 1.9 oz (92.135 kg)  03/20/13 200 lb (90.719 kg)  03/12/13 207 lb (93.895 kg)     Past Medical History  Diagnosis Date  . Bronchitis, mucopurulent recurrent   . Hypertension   . Diabetes mellitus   . Hypercholesterolemia   . Obesity   . Diverticulosis of colon   . Benign neoplasm of pancreas, except islets of Langerhans   . DJD (degenerative joint disease)   . Vitamin D deficiency   . Common migraine   . Generalized anxiety disorder   . Anemia   . Monoclonal gammopathy     Current Outpatient Prescriptions  Medication Sig Dispense Refill  . aspirin 81 MG  tablet Take 81 mg by mouth every evening.       . diltiazem (CARDIZEM CD) 240 MG 24 hr capsule Take 240 mg by mouth every evening.       . ferrous sulfate 325 (65 FE) MG tablet Take 325 mg by mouth 2 (two) times daily with a meal.      . furosemide (LASIX) 40 MG tablet Take 40 mg by mouth every morning.      . Iron-Vitamins (GERITOL TONIC PO) Take 5 mLs by mouth daily as needed (energy).      . isosorbide mononitrate (IMDUR) 60 MG 24 hr tablet Take 60 mg by mouth every morning.      . labetalol (NORMODYNE) 200 MG tablet Take 400 mg by mouth 2 (two) times daily.      Marland Kitchen losartan (COZAAR) 100 MG tablet Take 100 mg by mouth every morning.      . metFORMIN (GLUCOPHAGE) 500 MG tablet Take 500 mg by mouth 2 (two) times daily with a meal.      . Multiple Vitamin (MULTIVITAMIN WITH MINERALS) TABS tablet Take 1 tablet by mouth every morning.      . nitroGLYCERIN (NITROSTAT) 0.4 MG SL tablet Place 1 tablet (0.4 mg total) under the tongue every 5 (five) minutes as needed for chest pain.  25 tablet  3  . simvastatin (ZOCOR) 40 MG tablet Take 40 mg by mouth every evening.      Marland Kitchen  Vitamin D, Ergocalciferol, (DRISDOL) 50000 UNITS CAPS capsule Take 50,000 Units by mouth every 7 (seven) days. On Wednesdays       No current facility-administered medications for this visit.    Allergies:    Allergies  Allergen Reactions  . Piroxicam Other (See Comments)    REACTION: HEADACHE    Social History:  The patient  reports that she has never smoked. She has never used smokeless tobacco. She reports that she does not drink alcohol or use illicit drugs.   ROS:  Please see the history of present illness.   No edema, orthopnea, or side effects to isosorbide   All other systems reviewed and negative.   OBJECTIVE: VS:  BP 130/80  Pulse 80  Ht 5\' 5"  (1.651 m)  Wt 203 lb 1.9 oz (92.135 kg)  BMI 33.80 kg/m2 Well nourished, well developed, in no acute distress, obese but appears than stated age HEENT: normal Neck: JVD  flat. Carotid bruit absent  Cardiac:  normal S1, S2; RRR; no murmur Lungs:  clear to auscultation bilaterally, no wheezing, rhonchi or rales Abd: soft, nontender, no hepatomegaly Ext: Edema absent. Pulses 2+ and symmetric Skin: warm and dry Neuro:  CNs 2-12 intact, no focal abnormalities noted  EKG:  Not repeated       Signed, Illene Labrador III, MD 06/08/2013 11:01 AM

## 2013-06-09 ENCOUNTER — Ambulatory Visit: Payer: Medicare Other | Admitting: Pulmonary Disease

## 2013-06-30 ENCOUNTER — Ambulatory Visit: Payer: Medicare Other | Admitting: Physician Assistant

## 2013-08-07 ENCOUNTER — Other Ambulatory Visit: Payer: Medicare Other

## 2013-08-07 ENCOUNTER — Encounter: Payer: Self-pay | Admitting: Internal Medicine

## 2013-08-07 ENCOUNTER — Other Ambulatory Visit (INDEPENDENT_AMBULATORY_CARE_PROVIDER_SITE_OTHER): Payer: Medicare Other

## 2013-08-07 ENCOUNTER — Ambulatory Visit (INDEPENDENT_AMBULATORY_CARE_PROVIDER_SITE_OTHER): Payer: Medicare Other | Admitting: Internal Medicine

## 2013-08-07 VITALS — BP 120/82 | HR 78 | Temp 99.0°F | Ht 62.0 in | Wt 208.0 lb

## 2013-08-07 DIAGNOSIS — D649 Anemia, unspecified: Secondary | ICD-10-CM

## 2013-08-07 DIAGNOSIS — D472 Monoclonal gammopathy: Secondary | ICD-10-CM

## 2013-08-07 DIAGNOSIS — I10 Essential (primary) hypertension: Secondary | ICD-10-CM

## 2013-08-07 DIAGNOSIS — E119 Type 2 diabetes mellitus without complications: Secondary | ICD-10-CM

## 2013-08-07 DIAGNOSIS — E669 Obesity, unspecified: Secondary | ICD-10-CM

## 2013-08-07 DIAGNOSIS — E7800 Pure hypercholesterolemia, unspecified: Secondary | ICD-10-CM

## 2013-08-07 LAB — BASIC METABOLIC PANEL
BUN: 33 mg/dL — ABNORMAL HIGH (ref 6–23)
CALCIUM: 9.8 mg/dL (ref 8.4–10.5)
CO2: 22 mEq/L (ref 19–32)
CREATININE: 1.4 mg/dL — AB (ref 0.4–1.2)
Chloride: 107 mEq/L (ref 96–112)
GFR: 45.28 mL/min — AB (ref >60.00)
Glucose, Bld: 97 mg/dL (ref 70–99)
Potassium: 4.5 mEq/L (ref 3.5–5.1)
Sodium: 137 mEq/L (ref 135–145)

## 2013-08-07 LAB — CBC WITH DIFFERENTIAL/PLATELET
BASOS ABS: 0 10*3/uL (ref 0.0–0.1)
Basophils Relative: 0.5 % (ref 0.0–3.0)
Eosinophils Absolute: 0.2 10*3/uL (ref 0.0–0.7)
Eosinophils Relative: 2.4 % (ref 0.0–5.0)
HEMATOCRIT: 32.4 % — AB (ref 36.0–46.0)
HEMOGLOBIN: 10.6 g/dL — AB (ref 12.0–15.0)
LYMPHS ABS: 1.3 10*3/uL (ref 0.7–4.0)
LYMPHS PCT: 16.7 % (ref 12.0–46.0)
MCHC: 32.6 g/dL (ref 30.0–36.0)
MCV: 84.3 fl (ref 78.0–100.0)
MONOS PCT: 9.1 % (ref 3.0–12.0)
Monocytes Absolute: 0.7 10*3/uL (ref 0.1–1.0)
Neutro Abs: 5.5 10*3/uL (ref 1.4–7.7)
Neutrophils Relative %: 71.3 % (ref 43.0–77.0)
PLATELETS: 356 10*3/uL (ref 150.0–400.0)
RBC: 3.84 Mil/uL — ABNORMAL LOW (ref 3.87–5.11)
RDW: 14.3 % (ref 11.5–15.5)
WBC: 7.8 10*3/uL (ref 4.0–10.5)

## 2013-08-07 LAB — MICROALBUMIN / CREATININE URINE RATIO
CREATININE, U: 33.7 mg/dL
MICROALB/CREAT RATIO: 60.6 mg/g — AB (ref 0.0–30.0)
Microalb, Ur: 20.4 mg/dL — ABNORMAL HIGH (ref 0.0–1.9)

## 2013-08-07 LAB — HEMOGLOBIN A1C: Hgb A1c MFr Bld: 6.6 % — ABNORMAL HIGH (ref 4.6–6.5)

## 2013-08-07 LAB — FERRITIN: FERRITIN: 379.1 ng/mL — AB (ref 10.0–291.0)

## 2013-08-07 NOTE — Assessment & Plan Note (Signed)
Long hx same, baseline Hgb 10 Iv feraheme infusion x 2 12/2012 and repeat 02/2013 when hgb <9 Recheck CBC and ferritin now [facility] 12/2008 w/ diverticulosis and sm adenomatous polyp (removed)

## 2013-08-07 NOTE — Progress Notes (Signed)
Pre visit review using our clinic review tool, if applicable. No additional management support is needed unless otherwise documented below in the visit note. 

## 2013-08-07 NOTE — Progress Notes (Signed)
Subjective:    Patient ID: Wendy Kerr, female    DOB: 1930-11-25, 80 y.o.   MRN: [id number]  HPI  New pt to me, transfer from [place]  - here to est with PCP Reviewed chronic medical issues and interval medical events:   DM2 - checks cbgs qAM, cbg <130 - no symptoms hypoglycemia -the patient reports compliance with medication(s) as prescribed. Denies adverse side effects.  Anemia - chronic iron defic - IV ferehem infusion x 2 12/2012 and again 02/2013 due to Hgb <9 - takes oral iron 2x/d and denies symptoms bleeding   HTN - the patient reports compliance with medication(s) as prescribed. Denies adverse side effects.  Obesity - reviewed desire to weigh less - has [employer] membership, but less freq use than before - reports more sedentary since retirement in 2013, but planning to resume activity this summer at urging of son [name] -  Past Medical History  Diagnosis Date  . Bronchitis, mucopurulent recurrent   . Hypertension   . Diabetes mellitus   . Hypercholesterolemia   . Obesity   . Diverticulosis of colon   . Benign neoplasm of pancreas, except islets of Langerhans   . DJD (degenerative joint disease)   . Vitamin D deficiency   . Common migraine   . Generalized anxiety disorder   . Anemia   . Monoclonal gammopathy    Family History  Problem Relation Age of Onset  . Hypertension Mother   . Kidney failure Mother   . Hypertension Father   . Kidney failure Father    History  Substance Use Topics  . Smoking status: Never Smoker   . Smokeless tobacco: Never Used  . Alcohol Use: No    Review of Systems  Constitutional: Positive for fatigue. Negative for fever and unexpected weight change.  Respiratory: Negative for cough, shortness of breath and wheezing.   Cardiovascular: Negative for chest pain, palpitations and leg swelling.  Gastrointestinal: Negative for nausea, abdominal pain and diarrhea.  Neurological: Negative for dizziness, weakness, light-headedness and  headaches.  Psychiatric/Behavioral: Negative for dysphoric mood. The patient is not nervous/anxious.   All other systems reviewed and are negative.      Objective:   Physical Exam  BP 120/82  Pulse 78  Temp(Src) 99 F (37.2 C) (Oral)  Ht 5\' 2"  (1.575 m)  Wt 208 lb (94.348 kg)  BMI 38.03 kg/m2  SpO2 96% Wt Readings from Last 3 Encounters:  08/07/13 208 lb (94.348 kg)  06/08/13 203 lb 1.9 oz (92.135 kg)  03/20/13 200 lb (90.719 kg)   Constitutional: She is obese, but appears well-developed and well-nourished. No distress. Son [name] at side Neck: Normal range of motion. Neck supple. No JVD present. No thyromegaly present.  Cardiovascular: Normal rate, regular rhythm and normal heart sounds.  No murmur heard. trace BLE edema at ankles. Pulmonary/Chest: Effort normal and breath sounds normal. No respiratory distress. She has no wheezes.  Psychiatric: She has a normal mood and affect. Her behavior is normal. Judgment and thought content normal.   Lab Results  Component Value Date   WBC 8.1 04/24/2013   HGB 9.4* 04/24/2013   HCT 28.3* 04/24/2013   PLT 357 04/24/2013   GLUCOSE 129* 04/24/2013   CHOL 185 07/30/2012   TRIG 119.0 07/30/2012   HDL 59.70 07/30/2012   LDLDIRECT 142.5 08/11/2007   LDLCALC 102* 07/30/2012   ALT 15 01/03/2013   AST 21 01/03/2013   NA 140 04/24/2013   K 4.6 04/24/2013  CL 98 04/24/2013   CREATININE 1.37* 04/24/2013   BUN 24* 04/24/2013   CO2 28 04/24/2013   TSH 1.40 07/30/2012   HGBA1C 6.3 07/30/2012   MICROALBUR 24.0* 08/11/2007    Dg Chest 2 View  04/24/2013   CLINICAL DATA:  Hypertension, umbilical pain  EXAM: CHEST  2 VIEW  COMPARISON:  01/03/2013  FINDINGS: Cardiomegaly. Mild elevation of the right hemidiaphragm. Central mild vascular congestion without convincing pulmonary edema. No segmental infiltrate. Bony thorax is stable.  IMPRESSION: Cardiomegaly. Central vascular congestion without convincing pulmonary edema. No focal infiltrate.   Electronically Signed   By:  Lahoma Crocker M.D.   On: 04/24/2013 20:50       Assessment & Plan:   Problem List Items Addressed This Visit   ANEMIA NOS     Long hx same, baseline Hgb 10 Iv feraheme infusion x 2 12/2012 and repeat 02/2013 when hgb <9 Recheck CBC and ferritin now [facility] 12/2008 w/ diverticulosis and sm adenomatous polyp (removed)    Relevant Orders      CBC with Differential (Completed)      Ferritin (Completed)   DIABETES MELLITUS - Primary      Generally well controlled on current meds Check a1c q52mo and prn The current medical regimen is effective;  continue present plan and medications.  Lab Results  Component Value Date   HGBA1C 6.3 07/30/2012       Relevant Orders      Hemoglobin A1c (Completed)      Basic metabolic panel (Completed)      Microalbumin / creatinine urine ratio (Completed)   HYPERCHOLESTEROLEMIA     On statin - last lipids reviewed and at goal Check next OV when fasting The current medical regimen is effective;  continue present plan and medications.     HYPERTENSION      BP Readings from Last 3 Encounters:  08/07/13 120/82  06/08/13 130/80  04/24/13 159/90   The current medical regimen is effective;  continue present plan and medications.     MONOCLONAL GAMMOPATHY     MGUS diagnosed 2008 w/ prev anemia work up she has a monoclonal IgG Kappa paraprotein identified SPE/ IEP annually without any progression since 2008 Labs 6/14 showed monoclonal IgG kappa protein- 0.53gm/dl & quant Ig's showed IgG level = 1240mg /dl     OBESITY      Wt Readings from Last 3 Encounters:  08/07/13 208 lb (94.348 kg)  06/08/13 203 lb 1.9 oz (92.135 kg)  03/20/13 200 lb (90.719 kg)   The patient is asked to make an attempt to improve diet and exercise patterns to aid in medical management of this problem.

## 2013-08-07 NOTE — Patient Instructions (Addendum)
It was good to see you today.  We have reviewed your prior records including labs and tests today  Test(s) ordered today. Your results will be released to [place] (or called to you) after review, usually within 72hours after test completion. If any changes need to be made, you will be notified at that same time.  Medications reviewed and updated, no changes recommended at this time. Refill on medication(s) as discussed today.  Please schedule followup in 6 months, call sooner if problems.  Diabetes and Exercise Exercising regularly is important. It is not just about losing weight. It has many health benefits, such as:  Improving your overall fitness, flexibility, and endurance.  Increasing your bone density.  Helping with weight control.  Decreasing your body fat.  Increasing your muscle strength.  Reducing stress and tension.  Improving your overall health. People with diabetes who exercise gain additional benefits because exercise:  Reduces appetite.  Improves the body's use of blood sugar (glucose).  Helps lower or control blood glucose.  Decreases blood pressure.  Helps control blood lipids (such as cholesterol and triglycerides).  Improves the body's use of the hormone insulin by:  Increasing the body's insulin sensitivity.  Reducing the body's insulin needs.  Decreases the risk for heart disease because exercising:  Lowers cholesterol and triglycerides levels.  Increases the levels of good cholesterol (such as high-density lipoproteins [HDL]) in the body.  Lowers blood glucose levels. YOUR ACTIVITY PLAN  Choose an activity that you enjoy and set realistic goals. Your health care provider or diabetes educator can help you make an activity plan that works for you. You can break activities into 2 or 3 sessions throughout the day. Doing so is as good as one long session. Exercise ideas include:  Taking the dog for a walk.  Taking the stairs instead of the  elevator.  Dancing to your favorite song.  Doing your favorite exercise with a friend. RECOMMENDATIONS FOR EXERCISING WITH TYPE 1 OR TYPE 2 DIABETES   Check your blood glucose before exercising. If blood glucose levels are greater than 240 mg/dL, check for urine ketones. Do not exercise if ketones are present.  Avoid injecting insulin into areas of the body that are going to be exercised. For example, avoid injecting insulin into:  The arms when playing tennis.  The legs when jogging.  Keep a record of:  Food intake before and after you exercise.  Expected peak times of insulin action.  Blood glucose levels before and after you exercise.  The type and amount of exercise you have done.  Review your records with your health care provider. Your health care provider will help you to develop guidelines for adjusting food intake and insulin amounts before and after exercising.  If you take insulin or oral hypoglycemic agents, watch for signs and symptoms of hypoglycemia. They include:  Dizziness.  Shaking.  Sweating.  Chills.  Confusion.  Drink plenty of water while you exercise to prevent dehydration or heat stroke. Body water is lost during exercise and must be replaced.  Talk to your health care provider before starting an exercise program to make sure it is safe for you. Remember, almost any type of activity is better than none. Document Released: 05/05/2003 Document Revised: 10/15/2012 Document Reviewed: 07/22/2012 [employer] Patient Information 2014 [place].

## 2013-08-07 NOTE — Assessment & Plan Note (Signed)
Generally well controlled on current meds Check a1c q18mo and prn The current medical regimen is effective;  continue present plan and medications.  Lab Results  Component Value Date   HGBA1C 6.3 07/30/2012

## 2013-08-08 NOTE — Assessment & Plan Note (Signed)
MGUS diagnosed 2008 w/ prev anemia work up she has a monoclonal IgG Kappa paraprotein identified SPE/ IEP annually without any progression since 2008 Labs 6/14 showed monoclonal IgG kappa protein- 0.53gm/dl & quant Ig's showed IgG level = 1240mg /dl

## 2013-08-08 NOTE — Assessment & Plan Note (Signed)
Wt Readings from Last 3 Encounters:  08/07/13 208 lb (94.348 kg)  06/08/13 203 lb 1.9 oz (92.135 kg)  03/20/13 200 lb (90.719 kg)   The patient is asked to make an attempt to improve diet and exercise patterns to aid in medical management of this problem.

## 2013-08-08 NOTE — Assessment & Plan Note (Signed)
BP Readings from Last 3 Encounters:  08/07/13 120/82  06/08/13 130/80  04/24/13 159/90   The current medical regimen is effective;  continue present plan and medications.

## 2013-08-08 NOTE — Assessment & Plan Note (Signed)
On statin - last lipids reviewed and at goal Check next OV when fasting The current medical regimen is effective;  continue present plan and medications.

## 2013-08-10 ENCOUNTER — Other Ambulatory Visit: Payer: Self-pay | Admitting: Internal Medicine

## 2013-10-20 ENCOUNTER — Encounter: Payer: Self-pay | Admitting: Nurse Practitioner

## 2013-10-20 ENCOUNTER — Ambulatory Visit (INDEPENDENT_AMBULATORY_CARE_PROVIDER_SITE_OTHER): Payer: Medicare Other | Admitting: Nurse Practitioner

## 2013-10-20 VITALS — BP 118/72 | HR 77 | Temp 98.4°F | Resp 20 | Ht 64.0 in | Wt 216.0 lb

## 2013-10-20 DIAGNOSIS — R609 Edema, unspecified: Secondary | ICD-10-CM

## 2013-10-20 DIAGNOSIS — J069 Acute upper respiratory infection, unspecified: Secondary | ICD-10-CM

## 2013-10-20 DIAGNOSIS — R059 Cough, unspecified: Secondary | ICD-10-CM

## 2013-10-20 DIAGNOSIS — R05 Cough: Secondary | ICD-10-CM

## 2013-10-20 MED ORDER — CEFUROXIME AXETIL 500 MG PO TABS: 500.0000 mg | ORAL_TABLET | Freq: Two times a day (BID) | ORAL | Status: DC

## 2013-10-20 NOTE — Progress Notes (Signed)
Subjective:    Patient ID: Wendy Kerr, female    DOB: [birth year], 80 y.o.   MRN: [id number]  HPI Patient is seen for complaints of cough intermittently productive of clear to whitish phlegm.  Reports having cough for past 6 weeks which has progressively intensifyied.  Has taken mucinex past 2-3 days which has helped somewhat. Wheezing at times during night. Shortness of breath with walking which  Patient associates with being less active during the summer months.  Denies headache, sore throat or fever currently.   Has noted lower leg swelling more than usual during past few days.  Ha appointment with Cardiologist within next 4 weeks.  Denies chest pain, pressure or palpitations.      Review of Systems  Constitutional: Negative.   HENT: Positive for congestion, postnasal drip and sneezing. Negative for ear discharge, ear pain, sinus pressure and sore throat.   Eyes: Negative.        Watery eyes   Respiratory: Positive for cough, shortness of breath and wheezing. Negative for chest tightness.   Cardiovascular: Positive for leg swelling. Negative for chest pain.  Skin: Negative.   Neurological: Negative.  Negative for headaches.   Past Medical History  Diagnosis Date  . Bronchitis, mucopurulent recurrent   . Hypertension   . Diabetes mellitus   . Hypercholesterolemia   . Obesity   . Diverticulosis of colon   . Benign neoplasm of pancreas, except islets of Langerhans   . DJD (degenerative joint disease)   . Vitamin D deficiency   . Common migraine   . Generalized anxiety disorder   . Anemia   . Monoclonal gammopathy     History   Social History  . Marital Status: Single    Spouse Name: N/A    Number of Children: 2  . Years of Education: N/A   Occupational History  . 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 Smoking status: Never Smoker   . Smokeless tobacco: Never Used  . Alcohol Use: No  . Drug Use: No  . Sexual Activity: Not on file    Other Topics Concern  . Not on file   Social History Narrative  . No narrative on file    Past Surgical History  Procedure Laterality Date  . Resection serous cystadenoma of pancreas  2004    at [facility]  . Abdominal hysterectomy      Family History  Problem Relation Age of Onset  . Hypertension Mother   . Kidney failure Mother   . Hypertension Father   . Kidney failure Father     Allergies  Allergen Reactions  . Piroxicam Other (See Comments)    REACTION: HEADACHE    Current Outpatient Prescriptions on File Prior to Visit  Medication Sig Dispense Refill  . aspirin 81 MG tablet Take 81 mg by mouth every evening.       . diltiazem (CARDIZEM CD) 240 MG 24 hr capsule Take 240 mg by mouth every evening.       . ferrous sulfate 325 (65 FE) MG tablet TAKE 1 TABLET BY MOUTH TWICE DAILY WITH 500 MG VITAMIN C  60 tablet  11  . furosemide (LASIX) 40 MG tablet Take 40 mg by mouth every morning.      . Iron-Vitamins (GERITOL TONIC PO) Take 5 mLs by mouth daily as needed (energy).      . isosorbide mononitrate (IMDUR) 60 MG 24 hr tablet  Take 60 mg by mouth every morning.      . labetalol (NORMODYNE) 200 MG tablet Take 400 mg by mouth 2 (two) times daily.      Marland Kitchen losartan (COZAAR) 100 MG tablet Take 100 mg by mouth every morning.      . metFORMIN (GLUCOPHAGE) 500 MG tablet Take 500 mg by mouth 2 (two) times daily with a meal.      . Multiple Vitamin (MULTIVITAMIN WITH MINERALS) TABS tablet Take 1 tablet by mouth every morning.      . nitroGLYCERIN (NITROSTAT) 0.4 MG SL tablet Place 1 tablet (0.4 mg total) under the tongue every 5 (five) minutes as needed for chest pain.  25 tablet  3  . simvastatin (ZOCOR) 40 MG tablet Take 40 mg by mouth every evening.      . Vitamin D, Ergocalciferol, (DRISDOL) 50000 UNITS CAPS capsule Take 50,000 Units by mouth every 7 (seven) days. On Wednesdays      . ferrous sulfate 325 (65 FE) MG tablet Take 325 mg by mouth 2 (two) times daily with a meal.        No current facility-administered medications on file prior to visit.    BP 118/72  Pulse 77  Temp(Src) 98.4 F (36.9 C) (Oral)  Resp 20  Ht 5\' 4"  (1.626 m)  Wt 216 lb (97.977 kg)  BMI 37.06 kg/m2  SpO2 95%       Objective:   Physical Exam  Constitutional: She is oriented to person, place, and time. She appears well-developed and well-nourished. No distress.  HENT:  Head: Normocephalic.  Right Ear: External ear normal.  Left Ear: External ear normal.  Nose: Nose normal.  Mouth/Throat: Oropharynx is clear and moist.  Eyes: Pupils are equal, round, and reactive to light. Right eye exhibits no discharge. Left eye exhibits no discharge.  Neck: Normal range of motion.  Cardiovascular: Normal rate, regular rhythm and normal heart sounds.   Pulmonary/Chest: Effort normal. She has no wheezes. She has rales.  Few rales noted in right base  Musculoskeletal: She exhibits edema.  Plus 1-2 pitting edema in lower legs bilaterally  Lymphadenopathy:    She has no cervical adenopathy.  Neurological: She is alert and oriented to person, place, and time.  Skin: Skin is warm and dry.  Psychiatric: She has a normal mood and affect. Her behavior is normal.          Assessment & Plan:  1. Cough 2. Acute upper respiratory infections of unspecified site  - cefUROXime (CEFTIN) 500 MG tablet; Take 1 tablet (500 mg total) by mouth 2 (two) times daily with a meal.  Dispense: 14 tablet; Refill: 0  3. Edema Patient to increase lasix 40 mg to 1 1/2 tablet for 3 days then go back to 40 mg daily. Keep legs elevated, Ted hose as indicated.  Follow up with Cardiologist and Primary Care Physician  As scheduled Call clinic if symptoms not resolved or worsen.

## 2013-10-20 NOTE — Progress Notes (Signed)
Pre visit review using our clinic review tool, if applicable. No additional management support is needed unless otherwise documented below in the visit note. 

## 2013-10-20 NOTE — Patient Instructions (Signed)
Increase Lasix 40 mg to 1 1/2 tablet in the am for 3 days then go back to 1 tablet in the am. Continue with Mucinex cough suppressant and nasal spray daily given by Allergist. Call clinic if symptoms worsen , not resolved, or if you develop fever or chills.  Keep follow up appointment with your Primary Care Provider  Upper Respiratory Infection, Adult An upper respiratory infection (URI) is also sometimes known as the common cold. The upper respiratory tract includes the nose, sinuses, throat, trachea, and bronchi. Bronchi are the airways leading to the lungs. Most people improve within 1 week, but symptoms can last up to 2 weeks. A residual cough may last even longer.  CAUSES Many different viruses can infect the tissues lining the upper respiratory tract. The tissues become irritated and inflamed and often become very moist. Mucus production is also common. A cold is contagious. You can easily spread the virus to others by oral contact. This includes kissing, sharing a glass, coughing, or sneezing. Touching your mouth or nose and then touching a surface, which is then touched by another person, can also spread the virus. SYMPTOMS  Symptoms typically develop 1 to 3 days after you come in contact with a cold virus. Symptoms vary from person to person. They may include:  Runny nose.  Sneezing.  Nasal congestion.  Sinus irritation.  Sore throat.  Loss of voice (laryngitis).  Cough.  Fatigue.  Muscle aches.  Loss of appetite.  Headache.  Low-grade fever. DIAGNOSIS  You might diagnose your own cold based on familiar symptoms, since most people get a cold 2 to 3 times a year. Your caregiver can confirm this based on your exam. Most importantly, your caregiver can check that your symptoms are not due to another disease such as strep throat, sinusitis, pneumonia, asthma, or epiglottitis. Blood tests, throat tests, and X-rays are not necessary to diagnose a common cold, but they may  sometimes be helpful in excluding other more serious diseases. Your caregiver will decide if any further tests are required. RISKS AND COMPLICATIONS  You may be at risk for a more severe case of the common cold if you smoke cigarettes, have chronic heart disease (such as heart failure) or lung disease (such as asthma), or if you have a weakened immune system. The very young and very old are also at risk for more serious infections. Bacterial sinusitis, middle ear infections, and bacterial pneumonia can complicate the common cold. The common cold can worsen asthma and chronic obstructive pulmonary disease (COPD). Sometimes, these complications can require emergency medical care and may be life-threatening. PREVENTION  The best way to protect against getting a cold is to practice good hygiene. Avoid oral or hand contact with people with cold symptoms. Wash your hands often if contact occurs. There is no clear evidence that vitamin C, vitamin E, echinacea, or exercise reduces the chance of developing a cold. However, it is always recommended to get plenty of rest and practice good nutrition. TREATMENT  Treatment is directed at relieving symptoms. There is no cure. Antibiotics are not effective, because the infection is caused by a virus, not by bacteria. Treatment may include:  Increased fluid intake. Sports drinks offer valuable electrolytes, sugars, and fluids.  Breathing heated mist or steam (vaporizer or shower).  Eating chicken soup or other clear broths, and maintaining good nutrition.  Getting plenty of rest.  Using gargles or lozenges for comfort.  Controlling fevers with ibuprofen or acetaminophen as directed by your caregiver.  Increasing usage of your inhaler if you have asthma. Zinc gel and zinc lozenges, taken in the first 24 hours of the common cold, can shorten the duration and lessen the severity of symptoms. Pain medicines may help with fever, muscle aches, and throat pain. A  variety of non-prescription medicines are available to treat congestion and runny nose. Your caregiver can make recommendations and may suggest nasal or lung inhalers for other symptoms.  HOME CARE INSTRUCTIONS   Only take over-the-counter or prescription medicines for pain, discomfort, or fever as directed by your caregiver.  Use a warm mist humidifier or inhale steam from a shower to increase air moisture. This may keep secretions moist and make it easier to breathe.  Drink enough water and fluids to keep your urine clear or pale yellow.  Rest as needed.  Return to work when your temperature has returned to normal or as your caregiver advises. You may need to stay home longer to avoid infecting others. You can also use a face mask and careful hand washing to prevent spread of the virus. SEEK MEDICAL CARE IF:   After the first few days, you feel you are getting worse rather than better.  You need your caregiver's advice about medicines to control symptoms.  You develop chills, worsening shortness of breath, or brown or red sputum. These may be signs of pneumonia.  You develop yellow or brown nasal discharge or pain in the face, especially when you bend forward. These may be signs of sinusitis.  You develop a fever, swollen neck glands, pain with swallowing, or white areas in the back of your throat. These may be signs of strep throat. SEEK IMMEDIATE MEDICAL CARE IF:   You have a fever.  You develop severe or persistent headache, ear pain, sinus pain, or chest pain.  You develop wheezing, a prolonged cough, cough up blood, or have a change in your usual mucus (if you have chronic lung disease).  You develop sore muscles or a stiff neck. Document Released: 08/08/2000 Document Revised: 05/07/2011 Document Reviewed: 05/20/2013 [facility] Patient Information 2015 [place], [place]. This information is not intended to replace advice given to you by your health care provider. Make sure you  discuss any questions you have with your health care provider.

## 2013-12-22 ENCOUNTER — Telehealth: Payer: Self-pay | Admitting: Interventional Cardiology

## 2013-12-22 NOTE — Telephone Encounter (Signed)
New Message  Pt son called states that they have placed her on an exercise regiment and after 30 feet she has SOB// Please call back to discuss//sr Felxible with time can come within an Hour same day for any cancelations//sr

## 2013-12-23 NOTE — Telephone Encounter (Signed)
Returned call to pt son.appt scheduled with Dr.Smith on 11/2 @ 8:15am. Pt son verbalized understanding.

## 2013-12-28 ENCOUNTER — Ambulatory Visit (INDEPENDENT_AMBULATORY_CARE_PROVIDER_SITE_OTHER): Payer: Medicare Other | Admitting: Interventional Cardiology

## 2013-12-28 ENCOUNTER — Encounter: Payer: Self-pay | Admitting: Interventional Cardiology

## 2013-12-28 VITALS — BP 138/98 | HR 88 | Ht 64.0 in | Wt 212.8 lb

## 2013-12-28 DIAGNOSIS — I2510 Atherosclerotic heart disease of native coronary artery without angina pectoris: Secondary | ICD-10-CM

## 2013-12-28 DIAGNOSIS — I5032 Chronic diastolic (congestive) heart failure: Secondary | ICD-10-CM

## 2013-12-28 DIAGNOSIS — I10 Essential (primary) hypertension: Secondary | ICD-10-CM

## 2013-12-28 DIAGNOSIS — R0683 Snoring: Secondary | ICD-10-CM

## 2013-12-28 MED ORDER — FUROSEMIDE 40 MG PO TABS: 60.0000 mg | ORAL_TABLET | Freq: Every morning | ORAL | Status: DC

## 2013-12-28 NOTE — Patient Instructions (Signed)
Your physician has recommended you make the following change in your medication:  1) INCREASE Lasix to 40mg  twice daily today and tomorrow. Then take 60mg  daily. An Rx has been sent to your pharmacy.  Your physician recommends that you return for lab work on 01/04/14. (Bmet, Hemoglobin)  Call the office in 1 week with an update on your shortness of breath. [phone]  Your physician has recommended that you have a sleep study. This test records several body functions during sleep, including: brain activity, eye movement, oxygen and carbon dioxide blood levels, heart rate and rhythm, breathing rate and rhythm, the flow of air through your mouth and nose, snoring, body muscle movements, and chest and belly movement.   Your physician wants you to follow-up in: 6 months with Dr.Smith You will receive a reminder letter in the mail two months in advance. If you don't receive a letter, please call our office to schedule the follow-up appointment.

## 2013-12-28 NOTE — Progress Notes (Signed)
Patient ID: Wendy Kerr, female   DOB: [birth year], 80 y.o.   MRN: [id number]    [address], [address] [place], [place]  [address] Phone: [phone] Fax:  [phone]  Date:  12/28/2013   ID:  Wendy Kerr, Wendy Kerr 04/20/1930, MRN [id number]  PCP:  Gwendolyn Grant, MD   ASSESSMENT:  1.New: Dyspnea and fatigue.. Significant history of snoring and difficulty sleeping raising the question of sleep apnea 2. Chronic diastolic heart failure, with acute exacerbation 3. Coronary artery disease, asymptomatic with reference to angina 4. Hypertension, poorly controlled  PLAN:  1. Furosemide 40 mg twice a day for 2 days then increase the dose to 60 mg daily from chronic dose of 40 mg dail 2. Basic metabolic panel in 9-41 days after change in diuretic regimen. A hemoglobin should also be done at that time 3. Sleep study 4. Clinical follow-up in 6 months 5. Report response to diuretic late this week or early next week.   SUBJECTIVE: Wendy Kerr is a 80 y.o. female who has had waxing and waning exertional fatigue and dyspnea. She feels sleepy all the time. She has noticed some lower extremity swelling. She has difficulty sleeping but no chest discomfort. She has had no rapid heart beating. She denies orthopnea. She has not used nitroglycerin.   Wt Readings from Last 3 Encounters:  12/28/13 212 lb 12.8 oz (96.525 kg)  10/20/13 216 lb (97.977 kg)  08/07/13 208 lb (94.348 kg)     Past Medical History  Diagnosis Date  . Bronchitis, mucopurulent recurrent   . Hypertension   . Diabetes mellitus   . Hypercholesterolemia   . Obesity   . Diverticulosis of colon   . Benign neoplasm of pancreas, except islets of Langerhans   . DJD (degenerative joint disease)   . Vitamin D deficiency   . Common migraine   . Generalized anxiety disorder   . Anemia   . Monoclonal gammopathy     Current Outpatient Prescriptions  Medication Sig Dispense Refill  . ferrous sulfate 325 (65 FE)  MG tablet TAKE 1 TABLET BY MOUTH TWICE DAILY WITH 500 MG VITAMIN C 60 tablet 11  . furosemide (LASIX) 40 MG tablet Take 40 mg by mouth every morning.    . Iron-Vitamins (GERITOL TONIC PO) Take 5 mLs by mouth daily as needed (energy).    . isosorbide mononitrate (IMDUR) 60 MG 24 hr tablet Take 60 mg by mouth every morning.    . labetalol (NORMODYNE) 200 MG tablet Take 400 mg by mouth 2 (two) times daily.    Marland Kitchen losartan (COZAAR) 100 MG tablet Take 100 mg by mouth every morning.    . metFORMIN (GLUCOPHAGE) 500 MG tablet Take 500 mg by mouth 2 (two) times daily with a meal.    . Multiple Vitamin (MULTIVITAMIN WITH MINERALS) TABS tablet Take 1 tablet by mouth every morning.    . nitroGLYCERIN (NITROSTAT) 0.4 MG SL tablet Place 1 tablet (0.4 mg total) under the tongue every 5 (five) minutes as needed for chest pain. 25 tablet 3  . aspirin 81 MG tablet Take 81 mg by mouth every evening.     . diltiazem (CARDIZEM CD) 240 MG 24 hr capsule Take 240 mg by mouth every evening.     . simvastatin (ZOCOR) 40 MG tablet Take 40 mg by mouth every evening.     No current facility-administered medications for this visit.    Allergies:    Allergies  Allergen Reactions  .  Piroxicam Other (See Comments)    REACTION: HEADACHE    Social History:  The patient  reports that she has never smoked. She has never used smokeless tobacco. She reports that she does not drink alcohol or use illicit drugs.   ROS:  Please see the history of present illness.   No syncope. Denies tachycardia. No nitroglycerin use.   All other systems reviewed and negative.   OBJECTIVE: VS:  BP 138/98 mmHg  Pulse 88  Ht 5\' 4"  (1.626 m)  Wt 212 lb 12.8 oz (96.525 kg)  BMI 36.51 kg/m2  SpO2 97% Well nourished, well developed, in no acute distress, elderly and obese HEENT: normal Neck: JVD flat. Carotid bruit absent  Cardiac:  normal S1, S2; RRR; no murmur Lungs:  clear to auscultation bilaterally, no wheezing, rhonchi or rales Abd:  soft, nontender, no hepatomegaly Ext: Edema 1+ bilateral. Pulses 2+ bilateral Skin: warm and dry Neuro:  CNs 2-12 intact, no focal abnormalities noted  EKG:  Not performed       Signed, Illene Labrador III, MD 12/28/2013 8:37 AM

## 2013-12-31 ENCOUNTER — Encounter: Payer: Self-pay | Admitting: Internal Medicine

## 2013-12-31 ENCOUNTER — Ambulatory Visit (INDEPENDENT_AMBULATORY_CARE_PROVIDER_SITE_OTHER): Payer: Medicare Other | Admitting: Geriatric Medicine

## 2013-12-31 ENCOUNTER — Ambulatory Visit (INDEPENDENT_AMBULATORY_CARE_PROVIDER_SITE_OTHER): Payer: Medicare Other | Admitting: Internal Medicine

## 2013-12-31 VITALS — BP 118/62 | HR 67 | Temp 98.4°F | Resp 14 | Ht 65.0 in | Wt 217.0 lb

## 2013-12-31 DIAGNOSIS — E7800 Pure hypercholesterolemia, unspecified: Secondary | ICD-10-CM

## 2013-12-31 DIAGNOSIS — I10 Essential (primary) hypertension: Secondary | ICD-10-CM

## 2013-12-31 DIAGNOSIS — K219 Gastro-esophageal reflux disease without esophagitis: Secondary | ICD-10-CM

## 2013-12-31 DIAGNOSIS — I5032 Chronic diastolic (congestive) heart failure: Secondary | ICD-10-CM

## 2013-12-31 DIAGNOSIS — E669 Obesity, unspecified: Secondary | ICD-10-CM

## 2013-12-31 DIAGNOSIS — E119 Type 2 diabetes mellitus without complications: Secondary | ICD-10-CM

## 2013-12-31 DIAGNOSIS — E1169 Type 2 diabetes mellitus with other specified complication: Secondary | ICD-10-CM

## 2013-12-31 DIAGNOSIS — Z23 Encounter for immunization: Secondary | ICD-10-CM

## 2013-12-31 NOTE — Patient Instructions (Signed)
We will have you take the Nexium every day. This may help to get rid of your cough but it may take 1-2 months for the cough to get better. Having acid in your swallowing pipe can irritate your lungs and can cause cough.   We will watch the results of your blood test on Monday and will not check any blood tests today. We will see you back in about 3-6 months to check on your cough and can do blood work then.  Food Choices for Gastroesophageal Reflux Disease When you have gastroesophageal reflux disease (GERD), the foods you eat and your eating habits are very important. Choosing the right foods can help ease the discomfort of GERD. WHAT GENERAL GUIDELINES DO I NEED TO FOLLOW?  Choose fruits, vegetables, whole grains, low-fat dairy products, and low-fat meat, fish, and poultry.  Limit fats such as oils, salad dressings, butter, nuts, and avocado.  Keep a food diary to identify foods that cause symptoms.  Avoid foods that cause reflux. These may be different for different people.  Eat frequent small meals instead of three large meals each day.  Eat your meals slowly, in a relaxed setting.  Limit fried foods.  Cook foods using methods other than frying.  Avoid drinking alcohol.  Avoid drinking large amounts of liquids with your meals.  Avoid bending over or lying down until 2-3 hours after eating. WHAT FOODS ARE NOT RECOMMENDED? The following are some foods and drinks that may worsen your symptoms: Vegetables Tomatoes. Tomato juice. Tomato and spaghetti sauce. Chili peppers. Onion and garlic. Horseradish. Fruits Oranges, grapefruit, and lemon (fruit and juice). Meats High-fat meats, fish, and poultry. This includes hot dogs, ribs, ham, sausage, salami, and bacon. Dairy Whole milk and chocolate milk. Sour cream. Cream. Butter. Ice cream. Cream cheese.  Beverages Coffee and tea, with or without caffeine. Carbonated beverages or energy drinks. Condiments Hot sauce. Barbecue sauce.   Sweets/Desserts Chocolate and cocoa. Donuts. Peppermint and spearmint. Fats and Oils High-fat foods, including [place] fries and potato chips. Other Vinegar. Strong spices, such as black pepper, white pepper, red pepper, cayenne, curry powder, cloves, ginger, and chili powder. The items listed above may not be a complete list of foods and beverages to avoid. Contact your dietitian for more information. Document Released: 02/12/2005 Document Revised: 02/17/2013 Document Reviewed: 12/17/2012 [facility] Patient Information 2015 [place], [place]. This information is not intended to replace advice given to you by your health care provider. Make sure you discuss any questions you have with your health care provider.

## 2013-12-31 NOTE — Progress Notes (Signed)
Pre visit review using our clinic review tool, if applicable. No additional management support is needed unless otherwise documented below in the visit note. 

## 2014-01-01 DIAGNOSIS — K219 Gastro-esophageal reflux disease without esophagitis: Secondary | ICD-10-CM | POA: Insufficient documentation

## 2014-01-01 NOTE — Assessment & Plan Note (Signed)
Continue simvastatin. 

## 2014-01-01 NOTE — Assessment & Plan Note (Signed)
She takes calcium channel blocker, ARB, beta blocker, Lasix. BP well controlled at today's visit. She is due for repeat basic metabolic panel on Monday.

## 2014-01-01 NOTE — Assessment & Plan Note (Signed)
Currently on ARB, takes metformin. Last hemoglobin A1c 6.6. We'll recheck at next visit. She is overdue for eye exam and reminded her to get that done.

## 2014-01-01 NOTE — Progress Notes (Signed)
   Subjective:    Patient ID: Wendy Kerr, female    DOB: April 27, 1930, 80 y.o.   MRN: [id number]  HPI The patient is an 80 year old female who comes in today to establish care. She does have chronic diastolic heart failure, diabetes, hypertension, hyperlipidemia, monoclonal gammopathy. She is here with her son who helps provide history. He is concerned that she has had a cough for some time. She has been to see the heart doctor and they do not feel that it is coming from her. She also admits to acid reflux symptoms and she takes Nexium couple times a week for these symptoms. She denies chest pain, shortness of breath, productive sputum. She denies abdominal pain, constipation, diarrhea.  Review of Systems  Constitutional: Negative for fever, activity change, appetite change, fatigue and unexpected weight change.  HENT: Negative.   Respiratory: Positive for cough. Negative for chest tightness, shortness of breath and wheezing.   Cardiovascular: Positive for leg swelling. Negative for chest pain and palpitations.       Some swelling of her ankles  Gastrointestinal: Negative for abdominal pain, constipation and abdominal distention.  Musculoskeletal: Negative.   Skin: Negative.   Neurological: Negative.   Psychiatric/Behavioral: Negative.       Objective:   Physical Exam  Constitutional: She is oriented to person, place, and time. She appears well-developed and well-nourished.  overweight  HENT:  Head: Normocephalic and atraumatic.  Eyes: EOM are normal.  Neck: Normal range of motion.  Cardiovascular: Normal rate and regular rhythm.   Pulmonary/Chest: Effort normal and breath sounds normal. No respiratory distress. She has no wheezes. She has no rales.  Abdominal: Soft. Bowel sounds are normal. She exhibits no distension. There is no tenderness. There is no rebound.  Musculoskeletal: She exhibits edema.  1+ edema in the ankles bilaterally  Neurological: She is alert and oriented to  person, place, and time.  Skin: Skin is warm and dry.   Filed Vitals:   12/31/13 1333  BP: 118/62  Pulse: 67  Temp: 98.4 F (36.9 C)  TempSrc: Oral  Resp: 14  Height: 5\' 5"  (1.651 m)  Weight: 217 lb (98.431 kg)  SpO2: 97%      Assessment & Plan:

## 2014-01-01 NOTE — Assessment & Plan Note (Signed)
Since retiring about 2 years ago her level of activity has decreased. Her son will get her to go to the gym in the near future and they will work on her exercise.

## 2014-01-01 NOTE — Assessment & Plan Note (Signed)
Dr. Tamala Julian recently adjusted her diuretic dosing. She states she has been urinating more however she does not feel that her symptoms have changed significantly.taking Imdur, aspirin, ARB, Lasix, beta blocker.

## 2014-01-01 NOTE — Assessment & Plan Note (Signed)
Feel that her cough is coming from GERD. Advised her to take Nexium daily for the next several months to see if this improves her cough.

## 2014-01-04 ENCOUNTER — Other Ambulatory Visit (INDEPENDENT_AMBULATORY_CARE_PROVIDER_SITE_OTHER): Payer: Medicare Other | Admitting: *Deleted

## 2014-01-04 DIAGNOSIS — I10 Essential (primary) hypertension: Secondary | ICD-10-CM

## 2014-01-04 DIAGNOSIS — I5032 Chronic diastolic (congestive) heart failure: Secondary | ICD-10-CM

## 2014-01-04 LAB — BASIC METABOLIC PANEL
BUN: 21 mg/dL (ref 6–23)
CHLORIDE: 107 meq/L (ref 96–112)
CO2: 18 meq/L — AB (ref 19–32)
CREATININE: 1.3 mg/dL — AB (ref 0.4–1.2)
Calcium: 9.6 mg/dL (ref 8.4–10.5)
GFR: 51.41 mL/min — ABNORMAL LOW (ref >60.00)
Glucose, Bld: 117 mg/dL — ABNORMAL HIGH (ref 70–99)
POTASSIUM: 4.9 meq/L (ref 3.5–5.1)
Sodium: 139 mEq/L (ref 135–145)

## 2014-01-04 LAB — HEMOGLOBIN: HEMOGLOBIN: 10.2 g/dL — AB (ref 12.0–15.0)

## 2014-01-05 ENCOUNTER — Other Ambulatory Visit: Payer: Self-pay | Admitting: Pulmonary Disease

## 2014-01-07 ENCOUNTER — Telehealth: Payer: Self-pay

## 2014-01-07 NOTE — Telephone Encounter (Signed)
Pt aware of lab results.stable labs.pt verbalized understanding.

## 2014-01-07 NOTE — Telephone Encounter (Signed)
-----   Message from Sinclair Grooms, MD sent at 01/04/2014  6:21 PM EST ----- Stable labs

## 2014-01-26 DIAGNOSIS — D136 Benign neoplasm of pancreas: Secondary | ICD-10-CM | POA: Insufficient documentation

## 2014-01-30 ENCOUNTER — Other Ambulatory Visit: Payer: Self-pay | Admitting: Pulmonary Disease

## 2014-01-31 ENCOUNTER — Other Ambulatory Visit: Payer: Self-pay | Admitting: Internal Medicine

## 2014-02-01 ENCOUNTER — Ambulatory Visit: Payer: Medicare Other | Admitting: Interventional Cardiology

## 2014-02-04 ENCOUNTER — Other Ambulatory Visit: Payer: Self-pay | Admitting: Pulmonary Disease

## 2014-02-04 ENCOUNTER — Other Ambulatory Visit: Payer: Self-pay | Admitting: *Deleted

## 2014-02-04 MED ORDER — ISOSORBIDE MONONITRATE ER 60 MG PO TB24: 60.0000 mg | ORAL_TABLET | Freq: Every morning | ORAL | Status: DC

## 2014-02-05 ENCOUNTER — Other Ambulatory Visit: Payer: Self-pay | Admitting: Pulmonary Disease

## 2014-02-08 ENCOUNTER — Other Ambulatory Visit: Payer: Self-pay | Admitting: Internal Medicine

## 2014-02-27 ENCOUNTER — Other Ambulatory Visit: Payer: Self-pay | Admitting: Pulmonary Disease

## 2014-03-04 ENCOUNTER — Other Ambulatory Visit: Payer: Self-pay | Admitting: Internal Medicine

## 2014-03-04 ENCOUNTER — Other Ambulatory Visit: Payer: Self-pay | Admitting: Pulmonary Disease

## 2014-03-08 ENCOUNTER — Other Ambulatory Visit: Payer: Self-pay | Admitting: Pulmonary Disease

## 2014-03-10 ENCOUNTER — Other Ambulatory Visit: Payer: Self-pay | Admitting: Pulmonary Disease

## 2014-03-12 ENCOUNTER — Ambulatory Visit (HOSPITAL_BASED_OUTPATIENT_CLINIC_OR_DEPARTMENT_OTHER): Payer: Medicare Other | Attending: Cardiology | Admitting: *Deleted

## 2014-03-12 VITALS — Ht 65.0 in | Wt 212.0 lb

## 2014-03-12 DIAGNOSIS — I10 Essential (primary) hypertension: Secondary | ICD-10-CM

## 2014-03-12 DIAGNOSIS — R0602 Shortness of breath: Secondary | ICD-10-CM | POA: Diagnosis not present

## 2014-03-12 DIAGNOSIS — R0683 Snoring: Secondary | ICD-10-CM

## 2014-03-12 DIAGNOSIS — G4719 Other hypersomnia: Secondary | ICD-10-CM | POA: Diagnosis not present

## 2014-03-12 DIAGNOSIS — G4733 Obstructive sleep apnea (adult) (pediatric): Secondary | ICD-10-CM

## 2014-03-17 ENCOUNTER — Other Ambulatory Visit: Payer: Self-pay | Admitting: Internal Medicine

## 2014-03-24 ENCOUNTER — Ambulatory Visit (INDEPENDENT_AMBULATORY_CARE_PROVIDER_SITE_OTHER): Payer: Medicare Other | Admitting: Internal Medicine

## 2014-03-24 ENCOUNTER — Other Ambulatory Visit (INDEPENDENT_AMBULATORY_CARE_PROVIDER_SITE_OTHER): Payer: Medicare Other

## 2014-03-24 ENCOUNTER — Encounter: Payer: Self-pay | Admitting: Internal Medicine

## 2014-03-24 VITALS — BP 130/78 | HR 74 | Temp 98.0°F | Resp 20 | Ht 65.0 in | Wt 208.0 lb

## 2014-03-24 DIAGNOSIS — K219 Gastro-esophageal reflux disease without esophagitis: Secondary | ICD-10-CM

## 2014-03-24 DIAGNOSIS — E669 Obesity, unspecified: Secondary | ICD-10-CM | POA: Diagnosis not present

## 2014-03-24 DIAGNOSIS — E119 Type 2 diabetes mellitus without complications: Secondary | ICD-10-CM

## 2014-03-24 LAB — HEMOGLOBIN A1C: Hgb A1c MFr Bld: 6.9 % — ABNORMAL HIGH (ref 4.6–6.5)

## 2014-03-24 MED ORDER — DILTIAZEM HCL ER COATED BEADS 240 MG PO CP24: 240.0000 mg | ORAL_CAPSULE | Freq: Every day | ORAL | Status: DC

## 2014-03-24 NOTE — Patient Instructions (Signed)
We will have you go down to the lab so we can check on the diabetes.   Remember to go see the eye doctor sometime this spring.   Great job on the weight loss! Keep up the good work with the food.   Work on getting more active and think about doing a class at the [facility] for the social aspect of being around other people your age as well as exercise.   Diabetes and Exercise Exercising regularly is important. It is not just about losing weight. It has many health benefits, such as:  Improving your overall fitness, flexibility, and endurance.  Increasing your bone density.  Helping with weight control.  Decreasing your body fat.  Increasing your muscle strength.  Reducing stress and tension.  Improving your overall health. People with diabetes who exercise gain additional benefits because exercise:  Reduces appetite.  Improves the body's use of blood sugar (glucose).  Helps lower or control blood glucose.  Decreases blood pressure.  Helps control blood lipids (such as cholesterol and triglycerides).  Improves the body's use of the hormone insulin by:  Increasing the body's insulin sensitivity.  Reducing the body's insulin needs.  Decreases the risk for heart disease because exercising:  Lowers cholesterol and triglycerides levels.  Increases the levels of good cholesterol (such as high-density lipoproteins [HDL]) in the body.  Lowers blood glucose levels. YOUR ACTIVITY PLAN  Choose an activity that you enjoy and set realistic goals. Your health care provider or diabetes educator can help you make an activity plan that works for you. Exercise regularly as directed by your health care provider. This includes:  Performing resistance training twice a week such as push-ups, sit-ups, lifting weights, or using resistance bands.  Performing 150 minutes of cardio exercises each week such as walking, running, or playing sports.  Staying active and spending no more than 90  minutes at one time being inactive. Even short bursts of exercise are good for you. Three 10-minute sessions spread throughout the day are just as beneficial as a single 30-minute session. Some exercise ideas include:  Taking the dog for a walk.  Taking the stairs instead of the elevator.  Dancing to your favorite song.  Doing an exercise video.  Doing your favorite exercise with a friend. RECOMMENDATIONS FOR EXERCISING WITH TYPE 1 OR TYPE 2 DIABETES   Check your blood glucose before exercising. If blood glucose levels are greater than 240 mg/dL, check for urine ketones. Do not exercise if ketones are present.  Avoid injecting insulin into areas of the body that are going to be exercised. For example, avoid injecting insulin into:  The arms when playing tennis.  The legs when jogging.  Keep a record of:  Food intake before and after you exercise.  Expected peak times of insulin action.  Blood glucose levels before and after you exercise.  The type and amount of exercise you have done.  Review your records with your health care provider. Your health care provider will help you to develop guidelines for adjusting food intake and insulin amounts before and after exercising.  If you take insulin or oral hypoglycemic agents, watch for signs and symptoms of hypoglycemia. They include:  Dizziness.  Shaking.  Sweating.  Chills.  Confusion.  Drink plenty of water while you exercise to prevent dehydration or heat stroke. Body water is lost during exercise and must be replaced.  Talk to your health care provider before starting an exercise program to make sure it is safe  for you. Remember, almost any type of activity is better than none. Document Released: 05/05/2003 Document Revised: 06/29/2013 Document Reviewed: 07/22/2012 [facility] Patient Information 2015 [place], [place]. This information is not intended to replace advice given to you by your health care provider. Make sure  you discuss any questions you have with your health care provider.

## 2014-03-24 NOTE — Progress Notes (Signed)
Pre visit review using our clinic review tool, if applicable. No additional management support is needed unless otherwise documented below in the visit note. 

## 2014-03-25 ENCOUNTER — Other Ambulatory Visit: Payer: Self-pay | Admitting: Pulmonary Disease

## 2014-03-25 NOTE — Assessment & Plan Note (Signed)
She is not taking nexium daily but is doing better. Still thinking that this could have been the source of the cough and the weight loss has improved her cough.

## 2014-03-25 NOTE — Progress Notes (Signed)
   Subjective:    Patient ID: Wendy Kerr, female    DOB: 25-Oct-1930, 80 y.o.   MRN: [id number]  HPI The patient is an 81 YO female who is coming in to follow up on her cough. I had advised her to start taking her acid reflux medicine everyday. She has not done that but her cough is improved. She almost never notices it now and cannot say why it is better. She thinks maybe the weight loss. She has been working on dieting and has lose some weight. She has not been exercising like I recommended. Her son is with her and helps to provide the history. She has not gotten an eye exam as recommended either.   Review of Systems  Constitutional: Negative for fever, activity change, appetite change, fatigue and unexpected weight change.  HENT: Negative.   Respiratory: Negative for cough, chest tightness, shortness of breath and wheezing.   Cardiovascular: Positive for leg swelling. Negative for chest pain and palpitations.       Some swelling of her ankles  Gastrointestinal: Negative for abdominal pain, constipation and abdominal distention.  Genitourinary: Negative.   Musculoskeletal: Negative.   Skin: Negative.   Neurological: Negative.   Psychiatric/Behavioral: Negative.       Objective:   Physical Exam  Constitutional: She is oriented to person, place, and time. She appears well-developed and well-nourished.  overweight  HENT:  Head: Normocephalic and atraumatic.  Eyes: EOM are normal.  Neck: Normal range of motion.  Cardiovascular: Normal rate and regular rhythm.   Pulmonary/Chest: Effort normal and breath sounds normal. No respiratory distress. She has no wheezes. She has no rales.  Abdominal: Soft. Bowel sounds are normal. She exhibits no distension. There is no tenderness. There is no rebound.  Musculoskeletal: She exhibits edema.  trace edema in the ankles bilaterally  Neurological: She is alert and oriented to person, place, and time.  Skin: Skin is warm and dry.   Filed  Vitals:   03/24/14 0926  BP: 130/78  Pulse: 74  Temp: 98 F (36.7 C)  TempSrc: Oral  Resp: 20  Height: 5\' 5"  (1.651 m)  Weight: 208 lb (94.348 kg)  SpO2: 98%      Assessment & Plan:

## 2014-03-25 NOTE — Assessment & Plan Note (Signed)
She is losing weight and have encouraged her to continue. She is not exercising but is eating healthier. Reminded her that she needs to exercise as well.

## 2014-04-02 ENCOUNTER — Telehealth: Payer: Self-pay | Admitting: Cardiology

## 2014-04-02 ENCOUNTER — Encounter (HOSPITAL_BASED_OUTPATIENT_CLINIC_OR_DEPARTMENT_OTHER): Payer: Self-pay | Admitting: *Deleted

## 2014-04-02 DIAGNOSIS — G4733 Obstructive sleep apnea (adult) (pediatric): Secondary | ICD-10-CM

## 2014-04-02 NOTE — Sleep Study (Signed)
   NAME: Wendy Kerr DATE OF BIRTH:  Feb 14, 1931 MEDICAL RECORD NUMBER [id number]  LOCATION: [facility]  PHYSICIAN: Bre Pecina R  DATE OF STUDY: 03/12/2014  SLEEP STUDY TYPE: Split night Nocturnal Polysomnogram with CPAP titration               REFERRING PHYSICIAN: Sueanne Margarita, MD  INDICATION FOR STUDY: excessive daytime sleepiness, morning headaches  EPWORTH SLEEPINESS SCORE: 0 HEIGHT: 5\' 5"  (165.1 cm)  WEIGHT: 212 lb (96.163 kg)    Body mass index is 35.28 kg/(m^2).  NECK SIZE: 14.5 in.  MEDICATIONS: Reviewed in the chart  SLEEP ARCHITECTURE: During the diagnostic portion, the patient slept for a total of 123 minutes with no slow wave or REM sleep.  The onset to sleep latency was 27 minutes and the sleep efficiency was reduced at 74%.  During the CPAP titration, the total sleep time was 165 minutes with no slow wave sleep and 21 minutes of REM sleep.  The onset to REM sleep latency was delayed at 164 minutes.  The sleep efficiency was reduced at 66%.    RESPIRATORY DATA: During the diagnostic portion of the study, there were a total of 57 apneas, all of which were obstructive, and 19 hypopneas.  All occurred during NREM sleep and most occurred in the nonsupine position.  The overall AHI was 37 events per hour consistent with severe obstructive sleep apnea/hypopnea syndrome.  The patient was started on CPAP at 5cm H2O and titrated for respiratory events and snoring to 13cm H2O but respiratory events continued.  The patient was changed to Bipap and titrated to 17/13cm H2O but could not be adequately titrated due to lack of time.  The patient had moderate snoring during the diagnostic portion of the study.  OXYGEN DATA: The lowest O2 saturation during the diagnostic portion was 83% and during the CPAP titration was 82% in REM sleep.    CARDIAC DATA: The patient maintained NSR with PVC's, PAC's and ventricular couplets  MOVEMENT/PARASOMNIA: There were  periodic limb movements but index was within normal limits at 3.9 movements per hour.  There were no REM behavior sleep disorders noted.  IMPRESSION/ RECOMMENDATION:   1.  Severe Obstructive Sleep Apnea/Hypopnea syndrome with AHI 37 events per hour.  All events occurred in NREM sleep and most occurred in the supine position.  2.  The patient was tried on continuous positive pressure but could not maintain optimum pressure without continued respiratory events.   3.  The patient should be set up for BiPAP titration in the sleep lab. 4.  The patient should be counseled on good sleep hygiene and weight loss. 5.  The patient should be counseled to avoid sleeping supine.   Signed: Sueanne Margarita Diplomate, American Board of Sleep Medicine  ELECTRONICALLY SIGNED ON:  04/02/2014, 5:58 PM [place] PH: [phone]   FX: [phone] [place]

## 2014-04-02 NOTE — Telephone Encounter (Signed)
Please let patient know that she has severe OSA.  SHe had unseccessful CPAP titration due to severity of OSA.  Please set up BiPAP titration in the sleep lab

## 2014-04-05 NOTE — Telephone Encounter (Addendum)
Patient informed of CPAP titration results and verbal understanding expressed.  BiPAP titration ordered for scheduling. Patient agrees with treatment plan.

## 2014-04-05 NOTE — Addendum Note (Signed)
Addended by: Harland German A on: 04/05/2014 08:21 AM   Modules accepted: Orders

## 2014-04-07 ENCOUNTER — Other Ambulatory Visit: Payer: Self-pay | Admitting: Internal Medicine

## 2014-04-23 ENCOUNTER — Telehealth: Payer: Self-pay | Admitting: Internal Medicine

## 2014-04-23 MED ORDER — LABETALOL HCL 200 MG PO TABS: 400.0000 mg | ORAL_TABLET | Freq: Two times a day (BID) | ORAL | Status: DC

## 2014-04-23 NOTE — Telephone Encounter (Signed)
Refill sent to [facility] 

## 2014-04-23 NOTE — Telephone Encounter (Signed)
Pt called in requesting refill on labetalol (NORMODYNE) 200 MG tablet [[id number]]

## 2014-05-07 ENCOUNTER — Ambulatory Visit (HOSPITAL_BASED_OUTPATIENT_CLINIC_OR_DEPARTMENT_OTHER): Payer: Medicare Other | Attending: Cardiology

## 2014-05-07 VITALS — Ht 65.0 in | Wt 212.0 lb

## 2014-05-07 DIAGNOSIS — G4733 Obstructive sleep apnea (adult) (pediatric): Secondary | ICD-10-CM | POA: Diagnosis not present

## 2014-05-11 ENCOUNTER — Telehealth: Payer: Self-pay | Admitting: Cardiology

## 2014-05-11 DIAGNOSIS — G4733 Obstructive sleep apnea (adult) (pediatric): Secondary | ICD-10-CM

## 2014-05-11 NOTE — Telephone Encounter (Signed)
Please let patient know that there was not enough time for adequate BiPAP study because she did not sleep long enough.  Please set up with [facility] a 2 week auto BIPAP titration from 4 to 20cm H2O.  Then set up OV with me in 10 weeks  Please order ResMed CPAP with heated humidifier and small [employer] Simplus full face mask.

## 2014-05-11 NOTE — Sleep Study (Signed)
   NAME: Wendy Kerr DATE OF BIRTH:  [birth year] MEDICAL RECORD NUMBER [id number]  LOCATION: [facility]  PHYSICIAN: Saladin Petrelli R  DATE OF STUDY: 05/07/2014  SLEEP STUDY TYPE: Positive Airway Pressure Titration               REFERRING PHYSICIAN: Sueanne Margarita, MD  INDICATION FOR STUDY: OSA  EPWORTH SLEEPINESS SCORE: 0 HEIGHT: 5\' 5"  (165.1 cm)  WEIGHT: 212 lb (96.163 kg)    Body mass index is 35.28 kg/(m^2).  NECK SIZE: 14.5 in.  MEDICATIONS: reviewed in the chart  SLEEP ARCHITECTURE: The patient slept for a total of 130 minutes out of a total sleep period of 321 minutes.  There was no slow wave sleep and 13 minutes of REM sleep.  The onset to sleep latency was prolonged at 54 minutes and onset to REM sleep latency was normal at 106 minutes.  The sleep efficiency was reduced at 32%.    RESPIRATORY DATA: The patient was started on BiPAP at 8/4cm H2O and titrated for respiratory events and snoring to 18/14cm H2O.  The patient was not able to achieve REM supine sleep at optimum pressure without further respiratory events.   The AHI at 18/14cmH2O was 72 events per hour.  The AHI at 10/6cm H2O was 0.  There was severe snoring noted.  The patient did not have an adequate BiPAP titration due to lack of adequate sleep.  OXYGEN DATA: The lowest oxygen saturation was 81% and average oxygen saturation was 91%.  The amount of time spent with oxygen saturations lower than 88% was 3.7 minutes.    CARDIAC DATA: The patient maintained NSR with PAC's and PVC's.  The average heart rate was 66 bpm with a minimum heart rate of 36 bpm and maximum heart rate of 179 bpm.    MOVEMENT/PARASOMNIA: There were no periodic limb movement disorders or REM sleep behavior disorders.  IMPRESSION/ RECOMMENDATION:   1.   Severe Obstructive Sleep Apnea/Hypopnea syndrome with AHI 37 events per hour. All events occurred in NREM sleep and most occurred in the supine position 2.  The patient was  tried on continuous positive pressure but could not maintain optimum pressure without continued respiratory events. 3.  Occasional PVC's and PAC's.   4.  Unsuccessful BiPAP titration partly from lack of adequate sleep and inadequate time for titration.   5.  The patient should be counseled on good sleep hygiene and weight loss 6.  The patient should be counseled to avoid sleeping supine 7.  Recommend 2 week auto BiPAP titration from 4 to 20cm H2O at home.  Signed: Sueanne Margarita Diplomate, American Board of Sleep Medicine  ELECTRONICALLY SIGNED ON:  05/11/2014, 8:07 PM [place] PH: [phone]   FX: [phone] [place]

## 2014-05-17 ENCOUNTER — Encounter (HOSPITAL_BASED_OUTPATIENT_CLINIC_OR_DEPARTMENT_OTHER): Payer: Medicare Other

## 2014-05-17 ENCOUNTER — Encounter: Payer: Self-pay | Admitting: Cardiology

## 2014-05-17 NOTE — Telephone Encounter (Signed)
This encounter was created in error - please disregard.

## 2014-05-17 NOTE — Telephone Encounter (Addendum)
Informed patient of results and verbal understanding expressed.  ResMed CPAP with heated humidifier and small [employer] Simplus full face mask ordered. 2 week outo BiPAP titration from 4 to 20 cm H2O ordered. [place] notified.  Per patient's request, called her son, [name] to schedule F/U in 10 weeks.

## 2014-05-17 NOTE — Telephone Encounter (Signed)
New message    Patient son returning nurse called from today

## 2014-05-17 NOTE — Addendum Note (Signed)
Addended by: Harland German A on: 05/17/2014 04:43 PM   Modules accepted: Orders

## 2014-06-10 ENCOUNTER — Other Ambulatory Visit: Payer: Self-pay

## 2014-06-14 ENCOUNTER — Other Ambulatory Visit: Payer: Self-pay

## 2014-06-14 DIAGNOSIS — G4733 Obstructive sleep apnea (adult) (pediatric): Secondary | ICD-10-CM

## 2014-06-21 ENCOUNTER — Emergency Department (HOSPITAL_COMMUNITY): Payer: Medicare Other

## 2014-06-21 ENCOUNTER — Inpatient Hospital Stay (HOSPITAL_COMMUNITY)
Admission: EM | Admit: 2014-06-21 | Discharge: 2014-06-24 | DRG: 292 | Disposition: A | Discharge status: Home / Self Care | Payer: Medicare Other | Attending: Internal Medicine | Admitting: Internal Medicine

## 2014-06-21 ENCOUNTER — Inpatient Hospital Stay (HOSPITAL_COMMUNITY): Payer: Medicare Other

## 2014-06-21 ENCOUNTER — Encounter (HOSPITAL_COMMUNITY): Payer: Self-pay

## 2014-06-21 DIAGNOSIS — R0602 Shortness of breath: Secondary | ICD-10-CM | POA: Diagnosis not present

## 2014-06-21 DIAGNOSIS — I5032 Chronic diastolic (congestive) heart failure: Secondary | ICD-10-CM | POA: Diagnosis present

## 2014-06-21 DIAGNOSIS — I4510 Unspecified right bundle-branch block: Secondary | ICD-10-CM | POA: Diagnosis not present

## 2014-06-21 DIAGNOSIS — N183 Chronic kidney disease, stage 3 unspecified: Secondary | ICD-10-CM | POA: Diagnosis present

## 2014-06-21 DIAGNOSIS — Z8249 Family history of ischemic heart disease and other diseases of the circulatory system: Secondary | ICD-10-CM | POA: Diagnosis not present

## 2014-06-21 DIAGNOSIS — I200 Unstable angina: Secondary | ICD-10-CM | POA: Diagnosis not present

## 2014-06-21 DIAGNOSIS — I252 Old myocardial infarction: Secondary | ICD-10-CM

## 2014-06-21 DIAGNOSIS — N289 Disorder of kidney and ureter, unspecified: Secondary | ICD-10-CM | POA: Insufficient documentation

## 2014-06-21 DIAGNOSIS — R001 Bradycardia, unspecified: Secondary | ICD-10-CM | POA: Diagnosis not present

## 2014-06-21 DIAGNOSIS — R0600 Dyspnea, unspecified: Secondary | ICD-10-CM | POA: Diagnosis present

## 2014-06-21 DIAGNOSIS — I129 Hypertensive chronic kidney disease with stage 1 through stage 4 chronic kidney disease, or unspecified chronic kidney disease: Secondary | ICD-10-CM | POA: Diagnosis present

## 2014-06-21 DIAGNOSIS — N179 Acute kidney failure, unspecified: Secondary | ICD-10-CM | POA: Diagnosis not present

## 2014-06-21 DIAGNOSIS — I4891 Unspecified atrial fibrillation: Secondary | ICD-10-CM | POA: Diagnosis not present

## 2014-06-21 DIAGNOSIS — R748 Abnormal levels of other serum enzymes: Secondary | ICD-10-CM | POA: Diagnosis present

## 2014-06-21 DIAGNOSIS — I2510 Atherosclerotic heart disease of native coronary artery without angina pectoris: Secondary | ICD-10-CM | POA: Diagnosis present

## 2014-06-21 DIAGNOSIS — Z6833 Body mass index (BMI) 33.0-33.9, adult: Secondary | ICD-10-CM

## 2014-06-21 DIAGNOSIS — R079 Chest pain, unspecified: Secondary | ICD-10-CM

## 2014-06-21 DIAGNOSIS — E119 Type 2 diabetes mellitus without complications: Secondary | ICD-10-CM

## 2014-06-21 DIAGNOSIS — N189 Chronic kidney disease, unspecified: Secondary | ICD-10-CM

## 2014-06-21 DIAGNOSIS — R791 Abnormal coagulation profile: Secondary | ICD-10-CM

## 2014-06-21 DIAGNOSIS — Z6835 Body mass index (BMI) 35.0-35.9, adult: Secondary | ICD-10-CM

## 2014-06-21 DIAGNOSIS — I5022 Chronic systolic (congestive) heart failure: Secondary | ICD-10-CM | POA: Diagnosis present

## 2014-06-21 DIAGNOSIS — D472 Monoclonal gammopathy: Secondary | ICD-10-CM | POA: Diagnosis present

## 2014-06-21 DIAGNOSIS — Z79899 Other long term (current) drug therapy: Secondary | ICD-10-CM

## 2014-06-21 DIAGNOSIS — R9430 Abnormal result of cardiovascular function study, unspecified: Secondary | ICD-10-CM | POA: Diagnosis present

## 2014-06-21 DIAGNOSIS — J9600 Acute respiratory failure, unspecified whether with hypoxia or hypercapnia: Secondary | ICD-10-CM | POA: Diagnosis not present

## 2014-06-21 DIAGNOSIS — Z7982 Long term (current) use of aspirin: Secondary | ICD-10-CM | POA: Diagnosis not present

## 2014-06-21 DIAGNOSIS — E7800 Pure hypercholesterolemia, unspecified: Secondary | ICD-10-CM | POA: Diagnosis present

## 2014-06-21 DIAGNOSIS — I272 Other secondary pulmonary hypertension: Secondary | ICD-10-CM | POA: Diagnosis present

## 2014-06-21 DIAGNOSIS — I5033 Acute on chronic diastolic (congestive) heart failure: Secondary | ICD-10-CM | POA: Diagnosis not present

## 2014-06-21 DIAGNOSIS — I509 Heart failure, unspecified: Secondary | ICD-10-CM

## 2014-06-21 DIAGNOSIS — I10 Essential (primary) hypertension: Secondary | ICD-10-CM | POA: Diagnosis present

## 2014-06-21 DIAGNOSIS — Z7901 Long term (current) use of anticoagulants: Secondary | ICD-10-CM

## 2014-06-21 DIAGNOSIS — R7989 Other specified abnormal findings of blood chemistry: Secondary | ICD-10-CM | POA: Diagnosis present

## 2014-06-21 DIAGNOSIS — I459 Conduction disorder, unspecified: Secondary | ICD-10-CM

## 2014-06-21 DIAGNOSIS — G4733 Obstructive sleep apnea (adult) (pediatric): Secondary | ICD-10-CM | POA: Diagnosis present

## 2014-06-21 DIAGNOSIS — E669 Obesity, unspecified: Secondary | ICD-10-CM | POA: Diagnosis not present

## 2014-06-21 DIAGNOSIS — J9601 Acute respiratory failure with hypoxia: Secondary | ICD-10-CM | POA: Diagnosis not present

## 2014-06-21 DIAGNOSIS — R778 Other specified abnormalities of plasma proteins: Secondary | ICD-10-CM | POA: Diagnosis present

## 2014-06-21 DIAGNOSIS — E1169 Type 2 diabetes mellitus with other specified complication: Secondary | ICD-10-CM

## 2014-06-21 DIAGNOSIS — E875 Hyperkalemia: Secondary | ICD-10-CM | POA: Diagnosis not present

## 2014-06-21 DIAGNOSIS — E1122 Type 2 diabetes mellitus with diabetic chronic kidney disease: Secondary | ICD-10-CM | POA: Diagnosis present

## 2014-06-21 DIAGNOSIS — E872 Acidosis: Secondary | ICD-10-CM | POA: Diagnosis not present

## 2014-06-21 LAB — COMPREHENSIVE METABOLIC PANEL
ALBUMIN: 4 g/dL (ref 3.5–5.2)
ALK PHOS: 92 U/L (ref 39–117)
ALT: 18 U/L (ref 0–35)
AST: 22 U/L (ref 0–37)
Anion gap: 12 (ref 5–15)
BILIRUBIN TOTAL: 0.5 mg/dL (ref 0.3–1.2)
BUN: 49 mg/dL — ABNORMAL HIGH (ref 6–23)
CALCIUM: 9.3 mg/dL (ref 8.4–10.5)
CHLORIDE: 106 mmol/L (ref 96–112)
CO2: 17 mmol/L — ABNORMAL LOW (ref 19–32)
Creatinine, Ser: 2.24 mg/dL — ABNORMAL HIGH (ref 0.50–1.10)
GFR calc Af Amer: 22 mL/min — ABNORMAL LOW (ref >90)
GFR calc non Af Amer: 19 mL/min — ABNORMAL LOW (ref >90)
GLUCOSE: 204 mg/dL — AB (ref 70–99)
Potassium: 5.8 mmol/L — ABNORMAL HIGH (ref 3.5–5.1)
Sodium: 135 mmol/L (ref 135–145)
TOTAL PROTEIN: 7.4 g/dL (ref 6.0–8.3)

## 2014-06-21 LAB — CBC
HEMATOCRIT: 32 % — AB (ref 36.0–46.0)
HEMOGLOBIN: 10.6 g/dL — AB (ref 12.0–15.0)
MCH: 26.5 pg (ref 26.0–34.0)
MCHC: 33.1 g/dL (ref 30.0–36.0)
MCV: 80 fL (ref 78.0–100.0)
PLATELETS: 281 10*3/uL (ref 150–400)
RBC: 4 MIL/uL (ref 3.87–5.11)
RDW: 15.8 % — AB (ref 11.5–15.5)
WBC: 6.6 10*3/uL (ref 4.0–10.5)

## 2014-06-21 LAB — D-DIMER, QUANTITATIVE (NOT AT ARMC): D-Dimer, Quant: 3.09 ug/mL-FEU — ABNORMAL HIGH (ref 0.00–0.48)

## 2014-06-21 LAB — GLUCOSE, CAPILLARY: Glucose-Capillary: 122 mg/dL — ABNORMAL HIGH (ref 70–99)

## 2014-06-21 LAB — I-STAT TROPONIN, ED: TROPONIN I, POC: 0.14 ng/mL — AB (ref 0.00–0.08)

## 2014-06-21 LAB — APTT: aPTT: 29 seconds (ref 24–37)

## 2014-06-21 LAB — BRAIN NATRIURETIC PEPTIDE: B Natriuretic Peptide: 1150.9 pg/mL — ABNORMAL HIGH (ref 0.0–100.0)

## 2014-06-21 LAB — TROPONIN I: Troponin I: 0.14 ng/mL — ABNORMAL HIGH (ref <0.031)

## 2014-06-21 LAB — PROTIME-INR
INR: 1.17 (ref 0.00–1.49)
Prothrombin Time: 15 seconds (ref 11.6–15.2)

## 2014-06-21 MED ORDER — DILTIAZEM HCL ER COATED BEADS 240 MG PO CP24
240.0000 mg | ORAL_CAPSULE | Freq: Every day | ORAL | Status: DC
  Administered 2014-06-21 – 2014-06-23 (×3): 240 mg via ORAL
  Filled 2014-06-21 (×3): qty 1

## 2014-06-21 MED ORDER — SODIUM CHLORIDE 0.9 % IJ SOLN
3.0000 mL | Freq: Two times a day (BID) | INTRAMUSCULAR | Status: DC
  Administered 2014-06-22 – 2014-06-24 (×3): 3 mL via INTRAVENOUS

## 2014-06-21 MED ORDER — TECHNETIUM TO 99M ALBUMIN AGGREGATED: 5.8000 | Freq: Once | INTRAVENOUS | Status: AC | PRN

## 2014-06-21 MED ORDER — ISOSORBIDE MONONITRATE ER 30 MG PO TB24
60.0000 mg | ORAL_TABLET | Freq: Every day | ORAL | Status: DC
  Administered 2014-06-22 – 2014-06-24 (×3): 60 mg via ORAL
  Filled 2014-06-21 (×3): qty 2

## 2014-06-21 MED ORDER — SODIUM CHLORIDE 0.9 % IV SOLN
250.0000 mL | INTRAVENOUS | Status: DC | PRN
Start: 2014-06-21 — End: 2014-06-24

## 2014-06-21 MED ORDER — ENOXAPARIN SODIUM 100 MG/ML ~~LOC~~ SOLN
95.0000 mg | SUBCUTANEOUS | Status: DC
  Filled 2014-06-21: qty 1

## 2014-06-21 MED ORDER — ACETAMINOPHEN 650 MG RE SUPP: 650.0000 mg | Freq: Four times a day (QID) | RECTAL | Status: DC | PRN

## 2014-06-21 MED ORDER — FUROSEMIDE 10 MG/ML IJ SOLN
80.0000 mg | Freq: Two times a day (BID) | INTRAMUSCULAR | Status: DC
  Administered 2014-06-21 – 2014-06-23 (×5): 80 mg via INTRAVENOUS
  Filled 2014-06-21 (×6): qty 8

## 2014-06-21 MED ORDER — SODIUM CHLORIDE 0.9 % IJ SOLN: 3.0000 mL | INTRAMUSCULAR | Status: DC | PRN

## 2014-06-21 MED ORDER — TECHNETIUM TC 99M DIETHYLENETRIAME-PENTAACETIC ACID: 36.6000 | Freq: Once | INTRAVENOUS | Status: AC | PRN

## 2014-06-21 MED ORDER — ENOXAPARIN SODIUM 100 MG/ML ~~LOC~~ SOLN: 95.0000 mg | SUBCUTANEOUS | Status: DC

## 2014-06-21 MED ORDER — LABETALOL HCL 200 MG PO TABS
400.0000 mg | ORAL_TABLET | Freq: Two times a day (BID) | ORAL | Status: DC
  Administered 2014-06-21: 400 mg via ORAL
  Filled 2014-06-21 (×3): qty 2

## 2014-06-21 MED ORDER — ALBUTEROL SULFATE (2.5 MG/3ML) 0.083% IN NEBU: 2.5000 mg | INHALATION_SOLUTION | RESPIRATORY_TRACT | Status: DC | PRN

## 2014-06-21 MED ORDER — POLYETHYLENE GLYCOL 3350 17 G PO PACK: 17.0000 g | PACK | Freq: Every day | ORAL | Status: DC | PRN

## 2014-06-21 MED ORDER — SODIUM CHLORIDE 0.9 % IJ SOLN
3.0000 mL | Freq: Two times a day (BID) | INTRAMUSCULAR | Status: DC
  Administered 2014-06-21 – 2014-06-24 (×3): 3 mL via INTRAVENOUS

## 2014-06-21 MED ORDER — FERROUS SULFATE 325 (65 FE) MG PO TABS
325.0000 mg | ORAL_TABLET | Freq: Two times a day (BID) | ORAL | Status: DC
  Administered 2014-06-21 – 2014-06-24 (×6): 325 mg via ORAL
  Filled 2014-06-21 (×6): qty 1

## 2014-06-21 MED ORDER — SODIUM CHLORIDE 0.9 % IV SOLN
1000.0000 mL | INTRAVENOUS | Status: DC
  Administered 2014-06-21: 10 mL via INTRAVENOUS

## 2014-06-21 MED ORDER — NITROGLYCERIN 0.4 MG SL SUBL
0.4000 mg | SUBLINGUAL_TABLET | SUBLINGUAL | Status: DC | PRN
Start: 2014-06-21 — End: 2014-06-24

## 2014-06-21 MED ORDER — ISOSORBIDE MONONITRATE ER 30 MG PO TB24: 60.0000 mg | ORAL_TABLET | Freq: Every morning | ORAL | Status: DC

## 2014-06-21 MED ORDER — FUROSEMIDE 10 MG/ML IJ SOLN: 40.0000 mg | Freq: Two times a day (BID) | INTRAMUSCULAR | Status: DC

## 2014-06-21 MED ORDER — INSULIN ASPART 100 UNIT/ML ~~LOC~~ SOLN: 0.0000 [IU] | Freq: Every day | SUBCUTANEOUS | Status: DC

## 2014-06-21 MED ORDER — ONDANSETRON HCL 4 MG/2ML IJ SOLN
4.0000 mg | Freq: Four times a day (QID) | INTRAMUSCULAR | Status: DC | PRN
Start: 2014-06-21 — End: 2014-06-24

## 2014-06-21 MED ORDER — ACETAMINOPHEN 325 MG PO TABS
650.0000 mg | ORAL_TABLET | Freq: Four times a day (QID) | ORAL | Status: DC | PRN
  Administered 2014-06-23: 650 mg via ORAL
  Filled 2014-06-21: qty 2

## 2014-06-21 MED ORDER — ENOXAPARIN SODIUM 30 MG/0.3ML ~~LOC~~ SOLN
30.0000 mg | SUBCUTANEOUS | Status: DC
  Administered 2014-06-21 – 2014-06-23 (×3): 30 mg via SUBCUTANEOUS
  Filled 2014-06-21 (×2): qty 0.3

## 2014-06-21 MED ORDER — ASPIRIN 81 MG PO CHEW
81.0000 mg | CHEWABLE_TABLET | Freq: Every day | ORAL | Status: DC
Start: 2014-06-21 — End: 2014-06-24
  Administered 2014-06-21 – 2014-06-24 (×4): 81 mg via ORAL
  Filled 2014-06-21 (×4): qty 1

## 2014-06-21 MED ORDER — ONDANSETRON HCL 4 MG PO TABS: 4.0000 mg | ORAL_TABLET | Freq: Four times a day (QID) | ORAL | Status: DC | PRN

## 2014-06-21 MED ORDER — ALUM & MAG HYDROXIDE-SIMETH 200-200-20 MG/5ML PO SUSP: 30.0000 mL | Freq: Four times a day (QID) | ORAL | Status: DC | PRN

## 2014-06-21 MED ORDER — ENOXAPARIN SODIUM 40 MG/0.4ML ~~LOC~~ SOLN: 40.0000 mg | SUBCUTANEOUS | Status: DC

## 2014-06-21 MED ORDER — INSULIN ASPART 100 UNIT/ML ~~LOC~~ SOLN
0.0000 [IU] | Freq: Three times a day (TID) | SUBCUTANEOUS | Status: DC
  Administered 2014-06-22 (×2): 1 [IU] via SUBCUTANEOUS
  Administered 2014-06-23: 2 [IU] via SUBCUTANEOUS
  Administered 2014-06-24 (×2): 1 [IU] via SUBCUTANEOUS

## 2014-06-21 MED ORDER — SIMVASTATIN 40 MG PO TABS
40.0000 mg | ORAL_TABLET | Freq: Every day | ORAL | Status: DC
  Administered 2014-06-21 – 2014-06-23 (×3): 40 mg via ORAL
  Filled 2014-06-21 (×4): qty 1

## 2014-06-21 NOTE — Progress Notes (Signed)
ANTICOAGULATION CONSULT NOTE - Initial Consult  Pharmacy Consult for Lovenox Indication: pulmonary embolus  Allergies  Allergen Reactions  . Piroxicam Other (See Comments)    REACTION: HEADACHE    Patient Measurements:    Vital Signs: Temp: 97.3 F (36.3 C) (04/25 1308) Temp Source: Oral (04/25 1308) BP: 128/83 mmHg (04/25 1524) Pulse Rate: 69 (04/25 1524)  Labs:  Recent Labs  06/21/14 1353  HGB 10.6*  HCT 32.0*  PLT 281  APTT 29  LABPROT 15.0  INR 1.17  CREATININE 2.24*    CrCl cannot be calculated (Unknown ideal weight.).   Medical History: Past Medical History  Diagnosis Date  . Bronchitis, mucopurulent recurrent   . Hypertension   . Diabetes mellitus   . Hypercholesterolemia   . Obesity   . Diverticulosis of colon   . Benign neoplasm of pancreas, except islets of Langerhans   . DJD (degenerative joint disease)   . Vitamin D deficiency   . Common migraine   . Generalized anxiety disorder   . Anemia   . Monoclonal gammopathy   . OSA (obstructive sleep apnea)     severe with AHI 37 events per hour    Medications:  Scheduled:   Infusions:  . sodium chloride 10 mL (06/21/14 1350)    Assessment: 83 yoF admitted 4/25 with SOB.  Lab results show acute renal failure, EKG with questionable heart block.  VQ scan is ordered for suspected PE.  Pharmacy is consulted to dose Lovenox.   Rn reports weight of 210 lbs (95 kg)  SCr 2.24, CrCl ~ 22 ml/min  CBC:  Hgb 10.6, Plt 281  INR 1.17, APTT 29   Goal of Therapy:  Anti-Xa level 0.6-1 units/ml 4hrs after LMWH dose given Monitor platelets by anticoagulation protocol: Yes   Plan:   Lovenox 95 mg SQ x1 stat, then q24h  Pharmacy to f/u renal function, VQ scan, CBC  Gretta Arab PharmD, BCPS Pager [phone] 06/21/2014 3:58 PM

## 2014-06-21 NOTE — ED Notes (Signed)
Patient transported to X-ray 

## 2014-06-21 NOTE — ED Notes (Signed)
Pt presents with c/o shortness of breath that started this morning. Pt reports she does have a cardiologist and has a hx of some type of "blockage". Pt is talking in complete sentences, some expiratory wheezing can be heard.

## 2014-06-21 NOTE — ED Provider Notes (Addendum)
CSN: [id number]     Arrival date & time 06/21/14  1302 History   First MD Initiated Contact with Patient 06/21/14 1310     Chief Complaint  Patient presents with  . Shortness of Breath    HPI Pt developed some nausea last night.  This morning though she started to feel some shortness of breath.  She feels better now.  It seems to come and go.  It lasts 4-5 minutes.  Patient has been having some trouble with her breathing off and on now for several weeks to months. She has seen her cardiologist and her primary doctor. The patient's symptoms however worsened again this morning. She was concerned and decided to come to the emergency room. She is actually feeling somewhat better now than she was earlier.  She denies any trouble with chest pain. No fevers or coughing. No leg swelling. Past Medical History  Diagnosis Date  . Bronchitis, mucopurulent recurrent   . Hypertension   . Diabetes mellitus   . Hypercholesterolemia   . Obesity   . Diverticulosis of colon   . Benign neoplasm of pancreas, except islets of Langerhans   . DJD (degenerative joint disease)   . Vitamin D deficiency   . Common migraine   . Generalized anxiety disorder   . Anemia   . Monoclonal gammopathy   . OSA (obstructive sleep apnea)     severe with AHI 37 events per hour   Past Surgical History  Procedure Laterality Date  . Resection serous cystadenoma of pancreas  2004    at [facility]  . Abdominal hysterectomy     Family History  Problem Relation Age of Onset  . Hypertension Mother   . Kidney failure Mother   . Hypertension Father   . Kidney failure Father    History  Substance Use Topics  . Smoking status: Never Smoker   . Smokeless tobacco: Never Used  . Alcohol Use: No   OB History    No data available     Review of Systems  All other systems reviewed and are negative.     Allergies  Piroxicam  Home Medications   Prior to Admission medications   Medication Sig Start Date End Date Taking?  Authorizing Provider  aspirin 81 MG tablet Take 81 mg by mouth every evening.    Yes Historical Provider, MD  diltiazem (CARDIZEM CD) 240 MG 24 hr capsule Take 1 capsule (240 mg total) by mouth daily. 03/24/14  Yes Olga Millers, MD  ferrous sulfate 325 (65 FE) MG tablet TAKE 1 TABLET BY MOUTH TWICE DAILY WITH 500 MG VITAMIN C Patient taking differently: TAKE 1 TABLET BY MOUTH TWICE DAILY 08/10/13  Yes Rowe Clack, MD  furosemide (LASIX) 40 MG tablet Take 1.5 tablets (60 mg total) by mouth every morning. 12/28/13  Yes Belva Crome, MD  Iron-Vitamins (GERITOL TONIC PO) Take 5 mLs by mouth once a week.    Yes Historical Provider, MD  isosorbide mononitrate (IMDUR) 60 MG 24 hr tablet Take 1 tablet (60 mg total) by mouth every morning. 02/04/14  Yes Belva Crome, MD  labetalol (NORMODYNE) 200 MG tablet Take 2 tablets (400 mg total) by mouth 2 (two) times daily. 04/23/14  Yes Olga Millers, MD  losartan (COZAAR) 100 MG tablet Take 1 tablet (100 mg total) by mouth daily. 02/01/14  Yes Rowe Clack, MD  metFORMIN (GLUCOPHAGE) 500 MG tablet Take 500 mg by mouth 2 (two) times daily with a  meal.   Yes Historical Provider, MD  Multiple Vitamin (MULTIVITAMIN WITH MINERALS) TABS tablet Take 1 tablet by mouth every morning.   Yes Historical Provider, MD  simvastatin (ZOCOR) 40 MG tablet Take 1 tablet (40 mg total) by mouth daily at 6 PM. 02/01/14  Yes Rowe Clack, MD  Vitamin D, Ergocalciferol, (DRISDOL) 50000 UNITS CAPS capsule Take 50,000 Units by mouth every 7 (seven) days.  11/03/13  Yes Historical Provider, MD  nitroGLYCERIN (NITROSTAT) 0.4 MG SL tablet Place 1 tablet (0.4 mg total) under the tongue every 5 (five) minutes as needed for chest pain. Patient not taking: Reported on 06/21/2014 02/17/13   Belva Crome, MD   BP 128/83 mmHg  Pulse 69  Temp(Src) 97.3 F (36.3 C) (Oral)  Resp 19  SpO2 99% Physical Exam  Constitutional: She appears well-developed and well-nourished. No  distress.  Overweight  HENT:  Head: Normocephalic and atraumatic.  Right Ear: External ear normal.  Left Ear: External ear normal.  Eyes: Conjunctivae are normal. Right eye exhibits no discharge. Left eye exhibits no discharge. No scleral icterus.  Neck: Neck supple. No tracheal deviation present.  Cardiovascular: Normal rate, regular rhythm and intact distal pulses.   Pulmonary/Chest: Effort normal and breath sounds normal. No stridor. No respiratory distress. She has no wheezes. She has no rales.  Abdominal: Soft. Bowel sounds are normal. She exhibits no distension. There is no tenderness. There is no rebound and no guarding.  Musculoskeletal: She exhibits edema (mild edema of the lower extremities). She exhibits no tenderness.  Neurological: She is alert. She has normal strength. No cranial nerve deficit (no facial droop, extraocular movements intact, no slurred speech) or sensory deficit. She exhibits normal muscle tone. She displays no seizure activity. Coordination normal.  Skin: Skin is warm and dry. No rash noted.  Psychiatric: She has a normal mood and affect.  Nursing note and vitals reviewed.   ED Course  Procedures (including critical care time) Labs Review Labs Reviewed  CBC - Abnormal; Notable for the following:    Hemoglobin 10.6 (*)    HCT 32.0 (*)    RDW 15.8 (*)    All other components within normal limits  COMPREHENSIVE METABOLIC PANEL - Abnormal; Notable for the following:    Potassium 5.8 (*)    CO2 17 (*)    Glucose, Bld 204 (*)    BUN 49 (*)    Creatinine, Ser 2.24 (*)    GFR calc non Af Amer 19 (*)    GFR calc Af Amer 22 (*)    All other components within normal limits  BRAIN NATRIURETIC PEPTIDE - Abnormal; Notable for the following:    B Natriuretic Peptide 1150.9 (*)    All other components within normal limits  D-DIMER, QUANTITATIVE - Abnormal; Notable for the following:    D-Dimer, Quant 3.09 (*)    All other components within normal limits   I-STAT TROPOININ, ED - Abnormal; Notable for the following:    Troponin i, poc 0.14 (*)    All other components within normal limits  APTT  PROTIME-INR    Imaging Review Dg Chest 2 View  06/21/2014   CLINICAL DATA:  Shortness of breath. Onset of symptoms this morning.  EXAM: CHEST  2 VIEW  COMPARISON:  04/24/2013.  FINDINGS: Cardiomegaly. Aortic arch atherosclerosis. Lungs clear. No effusion. No airspace disease. Monitoring leads project over the chest.  IMPRESSION: Cardiomegaly without failure.   Electronically Signed   By: Arvilla Market.D.  On: 06/21/2014 14:48     EKG Interpretation   Date/Time:  Monday June 21 2014 13:51:10 EDT Ventricular Rate:  58 PR Interval:    QRS Duration: 142 QT Interval:  473 QTC Calculation: 465 R Axis:   -73 Text Interpretation:  second degree heart block , new since last tracing  Right bundle branch block Anterolateral infarct, old Confirmed by Latesha Chesney   MD-J, Jisele Price ([id number]) on 06/21/2014 2:04:09 PM      MDM   Final diagnoses:  Shortness of breath  Elevated d-dimer  Acute renal insufficiency  Elevated troponin    Patient's laboratory tests show acute renal failure. Her creatinine has increased to 2.24 compared to prior labs. She also has a bicarbonate level that has a worsening metabolic acidosis.  BNP, troponin and d dimer are elevated.  EKG with questionable heart block .  Will give dose of lovenox.  Consult with cardiology.  Order VQ scan and admit to the hospital for further evaluation.    Dorie Rank, MD 06/21/14 [id number]

## 2014-06-21 NOTE — ED Notes (Addendum)
Pt transported to VQ. Will be gone about an hour for testing

## 2014-06-21 NOTE — H&P (Signed)
History and Physical:    Wendy Kerr   QHU:[id number] DOB: 01-28-1931 DOA: 06/21/2014  Referring physician: Dr. Marye Round PCP: Sherian Maroon, MD    Consults called: Cardiology: Kerin Ransom, PA (her cardiologist is Dr. Pernell Dupre)  Chief Complaint: Shortness of breath  History of Present Illness:   Wendy Kerr is an 81 y.o. female with a PMH of HTN, DM (checks her sugars 2x a day, and reports her sugars are 100s-150s typically), MGUS, diastolic CHF (EF 46-56% by Echo 2014),and an abnormal Myoview in 2014 as well as severe OSA who presents with the acute onset of severe dyspnea last night.  The patient states she has been short of breath for the past year.  Says she has been evaluated for sleep apnea twice.  She says she doesn't have the SOB every every day, but when she awoke short of breath, she felt frightened. She says she has "a small blockage", and is worried about her heart.  No associated chest pain.  Her shortness of breath is wose with exertion, eases off with rest, lasts only a few minutes at a time.  Denies any non-compliance with her medications.    ROS:   Constitutional: No fever, no chills;  Appetite normal; No weight loss, no weight gain, no fatigue.  HEENT: No blurry vision, no diplopia, no pharyngitis, no dysphagia CV: No chest pain, no palpitations, no PND, no orthopnea, no edema.  Resp: + SOB, no cough, no pleuritic pain. GI: No nausea, no vomiting, no diarrhea, no melena, no hematochezia, no constipation, no abdominal pain.  GU: No dysuria, no hematuria, no frequency, no urgency. MSK: no myalgias, no arthralgias.  Neuro:  No headache, no focal neurological deficits, no history of seizures.  Psych: No depression, no anxiety.  Endo: No heat intolerance, no cold intolerance, no polyuria, no polydipsia  Skin: No rashes, no skin lesions.  Heme: No easy bruising.  Travel history: No recent travel.   Past Medical History:   Past Medical History    Diagnosis Date  . Bronchitis, mucopurulent recurrent   . Hypertension   . Diabetes mellitus   . Hypercholesterolemia   . Obesity   . Diverticulosis of colon   . Benign neoplasm of pancreas, except islets of Langerhans   . DJD (degenerative joint disease)   . Vitamin D deficiency   . Common migraine   . Generalized anxiety disorder   . Anemia   . Monoclonal gammopathy   . OSA (obstructive sleep apnea)     severe with AHI 37 events per hour    Past Surgical History:   Past Surgical History  Procedure Laterality Date  . Resection serous cystadenoma of pancreas  2004    at [facility]  . Abdominal hysterectomy      Social History:   History   Social History  . Marital Status: Single    Spouse Name: N/A  . Number of Children: 2  . Years of Education: N/A   Occupational History  . 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 Smoking status: Never Smoker   . Smokeless tobacco: Never Used  . Alcohol Use: No  . Drug Use: No  . Sexual Activity: Not on file   Other Topics Concern  . 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  Ambulates independently.    Family history:  Family History  Problem Relation Age of Onset  . Hypertension Mother   . Kidney failure Mother   . Hypertension Father   . Kidney failure Father     Allergies   Piroxicam  Current Medications:   Prior to Admission medications   Medication Sig Start Date End Date Taking? Authorizing Provider  aspirin 81 MG tablet Take 81 mg by mouth every evening.    Yes Historical Provider, MD  diltiazem (CARDIZEM CD) 240 MG 24 hr capsule Take 1 capsule (240 mg total) by mouth daily. 03/24/14  Yes Olga Millers, MD  ferrous sulfate 325 (65 FE) MG tablet TAKE 1 TABLET BY MOUTH TWICE DAILY WITH 500 MG VITAMIN C Patient taking differently: TAKE 1 TABLET BY MOUTH TWICE DAILY 08/10/13  Yes Rowe Clack, MD  furosemide (LASIX) 40  MG tablet Take 1.5 tablets (60 mg total) by mouth every morning. 12/28/13  Yes Belva Crome, MD  Iron-Vitamins (GERITOL TONIC PO) Take 5 mLs by mouth once a week.    Yes Historical Provider, MD  isosorbide mononitrate (IMDUR) 60 MG 24 hr tablet Take 1 tablet (60 mg total) by mouth every morning. 02/04/14  Yes Belva Crome, MD  labetalol (NORMODYNE) 200 MG tablet Take 2 tablets (400 mg total) by mouth 2 (two) times daily. 04/23/14  Yes Olga Millers, MD  losartan (COZAAR) 100 MG tablet Take 1 tablet (100 mg total) by mouth daily. 02/01/14  Yes Rowe Clack, MD  metFORMIN (GLUCOPHAGE) 500 MG tablet Take 500 mg by mouth 2 (two) times daily with a meal.   Yes Historical Provider, MD  Multiple Vitamin (MULTIVITAMIN WITH MINERALS) TABS tablet Take 1 tablet by mouth every morning.   Yes Historical Provider, MD  simvastatin (ZOCOR) 40 MG tablet Take 1 tablet (40 mg total) by mouth daily at 6 PM. 02/01/14  Yes Rowe Clack, MD  Vitamin D, Ergocalciferol, (DRISDOL) 50000 UNITS CAPS capsule Take 50,000 Units by mouth every 7 (seven) days.  11/03/13  Yes Historical Provider, MD  nitroGLYCERIN (NITROSTAT) 0.4 MG SL tablet Place 1 tablet (0.4 mg total) under the tongue every 5 (five) minutes as needed for chest pain. Patient not taking: Reported on 06/21/2014 02/17/13   Belva Crome, MD    Physical Exam:   Filed Vitals:   06/21/14 1308 06/21/14 1524 06/21/14 1548  BP: 141/77 128/83   Pulse: 74 69   Temp: 97.3 F (36.3 C)    TempSrc: Oral    Resp: 20 19   Height:   5\' 5"  (1.651 m)  Weight:   95.255 kg (210 lb)  SpO2: 98% 99%      Physical Exam: Blood pressure 128/83, pulse 69, temperature 97.3 F (36.3 C), temperature source Oral, resp. rate 19, height 5\' 5"  (1.651 m), weight 95.255 kg (210 lb), SpO2 99 %. Gen: No acute distress.  Head: Normocephalic, atraumatic. Eyes: PERRL, EOMI, sclerae nonicteric. Mouth: Oropharynx clear. Neck: Supple, no thyromegaly, no lymphadenopathy, + mild  jugular venous distention. Chest: Lungs with rales right base. CV: Heart sounds are regular.  No M/R/G. Abdomen: Soft, nontender, nondistended with normal active bowel sounds. Extremities: Extremities are without C/E/C. Skin: Warm and dry. Neuro: Alert and oriented times 3; Non-focal. Psych: Mood and affect normal.   Data Review:    Labs: Basic Metabolic Panel:  Recent Labs Lab 06/21/14 1353  NA 135  K 5.8*  CL 106  CO2 17*  GLUCOSE 204*  BUN 49*  CREATININE 2.24*  CALCIUM 9.3   Liver Function Tests:  Recent Labs Lab 06/21/14 1353  AST 22  ALT 18  ALKPHOS 92  BILITOT 0.5  PROT 7.4  ALBUMIN 4.0   CBC:  Recent Labs Lab 06/21/14 1353  WBC 6.6  HGB 10.6*  HCT 32.0*  MCV 80.0  PLT 281   Radiographic Studies: Dg Chest 2 View  06/21/2014   CLINICAL DATA:  Shortness of breath. Onset of symptoms this morning.  EXAM: CHEST  2 VIEW  COMPARISON:  04/24/2013.  FINDINGS: Cardiomegaly. Aortic arch atherosclerosis. Lungs clear. No effusion. No airspace disease. Monitoring leads project over the chest.  IMPRESSION: Cardiomegaly without failure.   Electronically Signed   By: Dereck Ligas M.D.   On: 06/21/2014 14:48   Nm Pulmonary Perf And Vent  06/21/2014   CLINICAL DATA:  Increasing shortness of breath  EXAM: NUCLEAR MEDICINE VENTILATION - PERFUSION LUNG SCAN  TECHNIQUE: Ventilation images were obtained in multiple projections using inhaled aerosol technetium 99 M DTPA. Perfusion images were obtained in multiple projections after intravenous injection of Tc-73m MAA.  RADIOPHARMACEUTICALS:  36.6 Technetium-2m DTPA aerosol inhalation and 5.8 Technetium-40m MAA IV  COMPARISON:  None.  FINDINGS: Ventilation: No focal ventilation defect. Heterogeneous ventilation.  Perfusion: No wedge shaped peripheral perfusion defects to suggest acute pulmonary embolism.  Corresponding chest radiograph is clear, noting cardiomegaly.  IMPRESSION: Negative V/Q scan.   Electronically Signed   By:  Julian Hy M.D.   On: 06/21/2014 17:04    EKG: Independently reviewed. RBBB, HR 58 bpm.   Assessment/Plan:   Principal Problem:   Shortness of breath/Acute respiratory failure/Acute on chronic diastolic CHF - Broad differential: CHF exacerbation, PE but no frank edema on CXR (BNP elevated), and VQ negative. - With troponin elevation, need to r/o ACS. - Will increase Lasix to 40 mg IV Q 12 hours.  Active Problems:   Diabetes mellitus type 2 in obese / Obesity - Hold metformin given ARF. - SSI, insulin sensitive scale Q AC/HS.    HYPERCHOLESTEROLEMIA - Continue Zocor.    Essential hypertension - Continue Cardizem, Imdur, Labetalol.  Increase Lasix.    Abnormal Myoview 2014 / Elevated troponin - Cardiology consulted. - Cycle cardiac markers.    OSA (obstructive sleep apnea) -  S/P sleep study 05/11/14.   -  Bipap Q HS per resp.    Acute on chronic renal insufficiency - Baseline creatinine 1.3.  Hold Cozaar and Metformin. - Diurese and follow.  May be from CHF exacerbation.    Positive D dimer - VQ negative.  DVT prophylaxis  Code Status: Full. Family Communication: Son, [name], by telephone. Disposition Plan: Home when respiratory function improved, and cardiac issues evaluated, likely 2-3 days.  Lives with her granddaughter.  Time spent: 70 minutes.  Cipriana Biller [facility] Pager [phone] Cell: [phone]   If 7PM-7AM, please contact night-coverage [email or web address] Password [facility] 06/21/2014, 5:09 PM

## 2014-06-21 NOTE — Plan of Care (Signed)
Problem: Consults Goal: General Medical Patient Education See Patient Education Module for specific education. Outcome: Completed/Met Date Met:  06/21/14 SOB

## 2014-06-21 NOTE — ED Notes (Signed)
Cardiologist MD at bedside

## 2014-06-21 NOTE — ED Notes (Signed)
Hospitalist MD at bedside. 

## 2014-06-21 NOTE — Consult Note (Addendum)
Reason for Consult:   Dyspnea  Requesting Physician:  ED Primary Cardiologist Dr Tamala Julian  HPI: This is a 81 y.o. female who lives with her granddaughter, followed by Dr Tamala Julian with a past medical history significant for HTN, DM, stage 3 CRI, diastolic CHF,and an abnormal Myoview in 2014. Echo showed an EF of 55-60% in 2014. She has done pretty well on medical Rx. Recently evaluated by Dr Radford Pax for sleep apnea. Last nigh she became increasingly dyspneic. She says she felt better sitting up in bed. She denies any chest pressure or chest pain. She has not missed any of her medications. In the ED her BNP was 1150, Troponin 0.14, SCr 2.4 (new). She also has a D- dimer of 3. She is being taken to radiology for VQ now.   PMHx:  Past Medical History  Diagnosis Date  . Bronchitis, mucopurulent recurrent   . Hypertension   . Diabetes mellitus   . Hypercholesterolemia   . Obesity   . Diverticulosis of colon   . Benign neoplasm of pancreas, except islets of Langerhans   . DJD (degenerative joint disease)   . Vitamin D deficiency   . Common migraine   . Generalized anxiety disorder   . Anemia   . Monoclonal gammopathy   . OSA (obstructive sleep apnea)     severe with AHI 37 events per hour    Past Surgical History  Procedure Laterality Date  . Resection serous cystadenoma of pancreas  2004    at [facility]  . Abdominal hysterectomy      SOCHx:  reports that she has never smoked. She has never used smokeless tobacco. She reports that she does not drink alcohol or use illicit drugs.  FAMHx: Family History  Problem Relation Age of Onset  . Hypertension Mother   . Kidney failure Mother   . Hypertension Father   . Kidney failure Father     ALLERGIES: Allergies  Allergen Reactions  . Piroxicam Other (See Comments)    REACTION: HEADACHE    ROS: Pertinent items are noted in HPI. see H&P for complete ROS  HOME MEDICATIONS: Prior to Admission medications   Medication  Sig Start Date End Date Taking? Authorizing Provider  aspirin 81 MG tablet Take 81 mg by mouth every evening.    Yes Historical Provider, MD  diltiazem (CARDIZEM CD) 240 MG 24 hr capsule Take 1 capsule (240 mg total) by mouth daily. 03/24/14  Yes Olga Millers, MD  ferrous sulfate 325 (65 FE) MG tablet TAKE 1 TABLET BY MOUTH TWICE DAILY WITH 500 MG VITAMIN C Patient taking differently: TAKE 1 TABLET BY MOUTH TWICE DAILY 08/10/13  Yes Rowe Clack, MD  furosemide (LASIX) 40 MG tablet Take 1.5 tablets (60 mg total) by mouth every morning. 12/28/13  Yes Belva Crome, MD  Iron-Vitamins (GERITOL TONIC PO) Take 5 mLs by mouth once a week.    Yes Historical Provider, MD  isosorbide mononitrate (IMDUR) 60 MG 24 hr tablet Take 1 tablet (60 mg total) by mouth every morning. 02/04/14  Yes Belva Crome, MD  labetalol (NORMODYNE) 200 MG tablet Take 2 tablets (400 mg total) by mouth 2 (two) times daily. 04/23/14  Yes Olga Millers, MD  losartan (COZAAR) 100 MG tablet Take 1 tablet (100 mg total) by mouth daily. 02/01/14  Yes Rowe Clack, MD  metFORMIN (GLUCOPHAGE) 500 MG tablet Take 500 mg by mouth 2 (two) times daily with a  meal.   Yes Historical Provider, MD  Multiple Vitamin (MULTIVITAMIN WITH MINERALS) TABS tablet Take 1 tablet by mouth every morning.   Yes Historical Provider, MD  simvastatin (ZOCOR) 40 MG tablet Take 1 tablet (40 mg total) by mouth daily at 6 PM. 02/01/14  Yes Rowe Clack, MD  Vitamin D, Ergocalciferol, (DRISDOL) 50000 UNITS CAPS capsule Take 50,000 Units by mouth every 7 (seven) days.  11/03/13  Yes Historical Provider, MD  nitroGLYCERIN (NITROSTAT) 0.4 MG SL tablet Place 1 tablet (0.4 mg total) under the tongue every 5 (five) minutes as needed for chest pain. Patient not taking: Reported on 06/21/2014 02/17/13   Belva Crome, MD    HOSPITAL MEDICATIONS: I have reviewed the patient's current medications.  VITALS: Blood pressure 128/83, pulse 69, temperature  97.3 F (36.3 C), temperature source Oral, resp. rate 19, height 5\' 5"  (1.651 m), weight 210 lb (95.255 kg), SpO2 99 %.  PHYSICAL EXAM: General appearance: alert, cooperative, no distress and moderately obese Neck: no carotid bruit and elelvated JVP Lungs: clear to auscultation bilaterally Heart: regular rate and rhythm Abdomen: obese, soft non tender Extremities: no edema Pulses: 2+ and symmetric Skin: Skin color, texture, turgor normal. No rashes or lesions Neurologic: Grossly normal  LABS: Results for orders placed or performed during the hospital encounter of 06/21/14 (from the past 24 hour(s))  APTT     Status: None   Collection Time: 06/21/14  1:53 PM  Result Value Ref Range   aPTT 29 24 - 37 seconds  CBC     Status: Abnormal   Collection Time: 06/21/14  1:53 PM  Result Value Ref Range   WBC 6.6 4.0 - 10.5 K/uL   RBC 4.00 3.87 - 5.11 MIL/uL   Hemoglobin 10.6 (L) 12.0 - 15.0 g/dL   HCT 32.0 (L) 36.0 - 46.0 %   MCV 80.0 78.0 - 100.0 fL   MCH 26.5 26.0 - 34.0 pg   MCHC 33.1 30.0 - 36.0 g/dL   RDW 15.8 (H) 11.5 - 15.5 %   Platelets 281 150 - 400 K/uL  Comprehensive metabolic panel     Status: Abnormal   Collection Time: 06/21/14  1:53 PM  Result Value Ref Range   Sodium 135 135 - 145 mmol/L   Potassium 5.8 (H) 3.5 - 5.1 mmol/L   Chloride 106 96 - 112 mmol/L   CO2 17 (L) 19 - 32 mmol/L   Glucose, Bld 204 (H) 70 - 99 mg/dL   BUN 49 (H) 6 - 23 mg/dL   Creatinine, Ser 2.24 (H) 0.50 - 1.10 mg/dL   Calcium 9.3 8.4 - 10.5 mg/dL   Total Protein 7.4 6.0 - 8.3 g/dL   Albumin 4.0 3.5 - 5.2 g/dL   AST 22 0 - 37 U/L   ALT 18 0 - 35 U/L   Alkaline Phosphatase 92 39 - 117 U/L   Total Bilirubin 0.5 0.3 - 1.2 mg/dL   GFR calc non Af Amer 19 (L) >90 mL/min   GFR calc Af Amer 22 (L) >90 mL/min   Anion gap 12 5 - 15  Protime-INR     Status: None   Collection Time: 06/21/14  1:53 PM  Result Value Ref Range   Prothrombin Time 15.0 11.6 - 15.2 seconds   INR 1.17 0.00 - 1.49  Brain  natriuretic peptide     Status: Abnormal   Collection Time: 06/21/14  1:53 PM  Result Value Ref Range   B Natriuretic Peptide 1150.9 (H)  0.0 - 100.0 pg/mL  D-dimer, quantitative     Status: Abnormal   Collection Time: 06/21/14  1:53 PM  Result Value Ref Range   D-Dimer, Quant 3.09 (H) 0.00 - 0.48 ug/mL-FEU  I-stat troponin, ED (not at [facility])     Status: Abnormal   Collection Time: 06/21/14  1:57 PM  Result Value Ref Range   Troponin i, poc 0.14 (HH) 0.00 - 0.08 ng/mL   Comment NOTIFIED PHYSICIAN    Comment 3            EKG: NSR, RBBB- old  IMAGING: Dg Chest 2 View  06/21/2014   CLINICAL DATA:  Shortness of breath. Onset of symptoms this morning.  EXAM: CHEST  2 VIEW  COMPARISON:  04/24/2013.  FINDINGS: Cardiomegaly. Aortic arch atherosclerosis. Lungs clear. No effusion. No airspace disease. Monitoring leads project over the chest.  IMPRESSION: Cardiomegaly without failure.   Electronically Signed   By: Dereck Ligas M.D.   On: 06/21/2014 14:48    IMPRESSION: Principal Problem:   Shortness of breath Active Problems:   Acute on chronic diastolic congestive heart failure   Acute on chronic renal insufficiency   Diabetes mellitus type 2 in obese   Essential hypertension   Chronic diastolic heart failure   Abnormal Myoview 2014   OSA (obstructive sleep apnea)   Troponin level elevated   Positive D dimer   HYPERCHOLESTEROLEMIA   Obesity   RECOMMENDATION: Dr Claiborne Billings to see. Stop Cozaar and Glucophage.  With multiple medical issues may be most appropriate to admit to medicine service. Consider high dose IV Lasix x 1 if VQ negative.   Time Spent Directly with Patient: 40 minutes  KILROY,LUKE K  06/21/2014, 4:11 PM    Patient seen and examined. Agree with assessment and plan.  Ms. Wendy Kerr is a very pleasant 81 year old African-American female who is followed by Dr. Pernell Dupre for her primary cardiology care.  She is retired and previously had worked for almost 20 years  and [employer] [place] [employer].  She has a history of hypertension, diabetes mellitus, obesity, ? CAD, and has developed dyspnea and fatigue.  She has been evaluated for sleep apnea and will be getting a new CPAP machine.  She has noticed increasing shortness of breath with activity.  She awakened this morning at 3 AM with shortness of breath from sleep.  She presented to the emergency room where BNP is elevated at 1150, and troponin is mildly positive at 0.1.  Serum creatinine has increased to 2.4 which is new from baseline and d-dimer was mildly positive at 3.0.  She went for VQ scan which was just completed and this is negative for any suggestion of PE.  There was heterogeneous ventilation without focal ventilation defect.  She had normal perfusion.  Her last echo Doppler study was in December 2014 which showed an ejection fraction at 55-60% and there was evidence for severe focal basal hypertrophy of the septum.  There was grade 2 diastolic dysfunction, mitral annular calcification with mild MR, and evidence for mild pulmonary hypertension with estimated PA pressure 36 mm.  Suspect acute on chronic diastolic heart failure, possibly combined. In light of her worsening renal function, we will hold ARB therapy presently.  ECG reveals possible Wenkebach block vs more adanced block; right bundle branch block, probably old anterolateral and inferior MI.   She will be treated with IV furosemide for diuresis.  Serial troponins as well as ECG's will be obtained.  Will also hold  Labetelol  and consider changing diltiazem to amlodipine.  Will obtain follow-up echo to reassess systolic and diastolic function.  Troy Sine, MD, [facility] 06/21/2014 5:22 PM

## 2014-06-22 DIAGNOSIS — I4510 Unspecified right bundle-branch block: Secondary | ICD-10-CM | POA: Diagnosis present

## 2014-06-22 DIAGNOSIS — J9600 Acute respiratory failure, unspecified whether with hypoxia or hypercapnia: Secondary | ICD-10-CM

## 2014-06-22 LAB — BASIC METABOLIC PANEL
Anion gap: 9 (ref 5–15)
BUN: 51 mg/dL — ABNORMAL HIGH (ref 6–23)
CO2: 17 mmol/L — ABNORMAL LOW (ref 19–32)
Calcium: 9.2 mg/dL (ref 8.4–10.5)
Chloride: 106 mmol/L (ref 96–112)
Creatinine, Ser: 2.05 mg/dL — ABNORMAL HIGH (ref 0.50–1.10)
GFR calc Af Amer: 25 mL/min — ABNORMAL LOW (ref >90)
GFR, EST NON AFRICAN AMERICAN: 22 mL/min — AB (ref >90)
Glucose, Bld: 117 mg/dL — ABNORMAL HIGH (ref 70–99)
POTASSIUM: 4.8 mmol/L (ref 3.5–5.1)
SODIUM: 132 mmol/L — AB (ref 135–145)

## 2014-06-22 LAB — GLUCOSE, CAPILLARY
GLUCOSE-CAPILLARY: 132 mg/dL — AB (ref 70–99)
Glucose-Capillary: 129 mg/dL — ABNORMAL HIGH (ref 70–99)
Glucose-Capillary: 144 mg/dL — ABNORMAL HIGH (ref 70–99)
Glucose-Capillary: 134 mg/dL — ABNORMAL HIGH (ref 70–99)

## 2014-06-22 LAB — TROPONIN I
TROPONIN I: 0.19 ng/mL — AB (ref <0.031)
Troponin I: 0.17 ng/mL — ABNORMAL HIGH (ref <0.031)

## 2014-06-22 MED ORDER — LIVING BETTER WITH HEART FAILURE BOOK
Freq: Once | Status: AC
  Administered 2014-06-22: 13:00:00

## 2014-06-22 NOTE — Progress Notes (Signed)
Patient Demographics  Wendy Kerr, is a 81 y.o. female, DOB - [birth year], GYI:[id number]  Admit date - 06/21/2014   Admitting Physician Venetia Maxon Rama, MD  Outpatient Primary MD for the patient is Sherian Maroon, MD  LOS - 1   Chief Complaint  Patient presents with  . Shortness of Breath        Subjective:   Neya Creegan today has, No headache, No chest pain, No abdominal pain - No Nausea, No new weakness tingling or numbness, No Cough - reports her shortness of breath is improving.  Assessment & Plan    Principal Problem:   Shortness of breath Active Problems:   Diabetes mellitus type 2 in obese   HYPERCHOLESTEROLEMIA   Obesity   Essential hypertension   Chronic diastolic heart failure   Abnormal Myoview 2014   OSA (obstructive sleep apnea)   Acute on chronic diastolic congestive heart failure   Acute on chronic renal insufficiency   Troponin level elevated   Positive D dimer   Acute respiratory failure   Right bundle branch block  Acute on chronic diastolic CHF - Patient presents with shortness of breath and hypoxia, VQ is negative, no frank edema on chest x-ray. - Significantly improving on IV Lasix, diuresis managed by cardiology. -   -765 ml since admission. - Repeat 2-D echo pending  Diabetes mellitus type 2 in obese / Obesity - Hold metformin given ARF. - SSI, insulin sensitive scale Q AC/HS. CBGs are acceptable   HYPERCHOLESTEROLEMIA - Continue Zocor.   Essential hypertension - Continue Cardizem, Imdur, Labetalol. And Lasix, currently on the lower side, will monitor closely.   Abnormal Myoview 2014 / Elevated troponin - Cardiology appreciated, most likely in the setting of acute renal failure and acute on chronic diastolic CHF. - Troponins plateaued - for  nuclear stress test in a.m..   OSA (obstructive  sleep apnea) - S/P sleep study 05/11/14.  - Bipap Q HS per resp.   Acute on chronic renal insufficiency - Baseline creatinine 1.3.Continue to Hold Cozaar and Metformin. - Monitor closely as an IV diuresis, creatinine gradually improving today is 2.04   Positive D dimer - VQ negative.  Code Status: Full  Family Communication: None at bedside  Disposition Plan: Remains inpatient   Procedures  None   Consults   Cardiology   Medications  Scheduled Meds: . aspirin  81 mg Oral Daily  . diltiazem  240 mg Oral QHS  . enoxaparin (LOVENOX) injection  30 mg Subcutaneous Q24H  . ferrous sulfate  325 mg Oral BID  . furosemide  80 mg Intravenous Q12H  . insulin aspart  0-5 Units Subcutaneous QHS  . insulin aspart  0-9 Units Subcutaneous TID WC  . isosorbide mononitrate  60 mg Oral Daily  . Living Better with Heart Failure Book   Does not apply Once  . simvastatin  40 mg Oral q1800  . sodium chloride  3 mL Intravenous Q12H  . sodium chloride  3 mL Intravenous Q12H   Continuous Infusions:  PRN Meds:.sodium chloride, acetaminophen **OR** acetaminophen, albuterol, alum & mag hydroxide-simeth, nitroGLYCERIN, ondansetron **OR** ondansetron (ZOFRAN) IV, polyethylene glycol, sodium chloride  DVT Prophylaxis  Lovenox -  Lab Results  Component Value Date   PLT  281 06/21/2014    Antibiotics   Anti-infectives    None          Objective:   Filed Vitals:   06/21/14 1548 06/21/14 2209 06/22/14 0649 06/22/14 1320  BP:  160/87 124/69 106/53  Pulse:  86 61 56  Temp:  98.3 F (36.8 C) 97.6 F (36.4 C) 97.9 F (36.6 C)  TempSrc:  Oral Oral Oral  Resp:  20 20 18   Height: 5\' 5"  (1.651 m)     Weight: 95.255 kg (210 lb)  92.307 kg (203 lb 8 oz)   SpO2:  100% 99% 100%    Wt Readings from Last 3 Encounters:  06/22/14 92.307 kg (203 lb 8 oz)  05/07/14 96.163 kg (212 lb)  03/24/14 94.348 kg (208 lb)     Intake/Output Summary (Last 24 hours) at 06/22/14 1648 Last  data filed at 06/22/14 1321  Gross per 24 hour  Intake   1110 ml  Output   1875 ml  Net   -765 ml     Physical Exam  Awake Alert, Oriented X 3, No new F.N deficits, Normal affect Morning Sun.AT,PERRAL Supple Neck,No JVD, No cervical lymphadenopathy appriciated.  Symmetrical Chest wall movement, Good air movement bilaterally,  RRR,No Gallops,Rubs or new Murmurs, No Parasternal Heave +ve B.Sounds, Abd Soft, No tenderness, No organomegaly appriciated, No rebound - guarding or rigidity. No Cyanosis, Clubbing , +1 edema, No new Rash or bruise     Data Review   Micro Results No results found for this or any previous visit (from the past 240 hour(s)).  Radiology Reports Dg Chest 2 View  06/21/2014   CLINICAL DATA:  Shortness of breath. Onset of symptoms this morning.  EXAM: CHEST  2 VIEW  COMPARISON:  04/24/2013.  FINDINGS: Cardiomegaly. Aortic arch atherosclerosis. Lungs clear. No effusion. No airspace disease. Monitoring leads project over the chest.  IMPRESSION: Cardiomegaly without failure.   Electronically Signed   By: Dereck Ligas M.D.   On: 06/21/2014 14:48   Nm Pulmonary Perf And Vent  06/21/2014   CLINICAL DATA:  Increasing shortness of breath  EXAM: NUCLEAR MEDICINE VENTILATION - PERFUSION LUNG SCAN  TECHNIQUE: Ventilation images were obtained in multiple projections using inhaled aerosol technetium 99 M DTPA. Perfusion images were obtained in multiple projections after intravenous injection of Tc-65m MAA.  RADIOPHARMACEUTICALS:  36.6 Technetium-19m DTPA aerosol inhalation and 5.8 Technetium-61m MAA IV  COMPARISON:  None.  FINDINGS: Ventilation: No focal ventilation defect. Heterogeneous ventilation.  Perfusion: No wedge shaped peripheral perfusion defects to suggest acute pulmonary embolism.  Corresponding chest radiograph is clear, noting cardiomegaly.  IMPRESSION: Negative V/Q scan.   Electronically Signed   By: Julian Hy M.D.   On: 06/21/2014 17:04    CBC  Recent Labs Lab  06/21/14 1353  WBC 6.6  HGB 10.6*  HCT 32.0*  PLT 281  MCV 80.0  MCH 26.5  MCHC 33.1  RDW 15.8*    Chemistries   Recent Labs Lab 06/21/14 1353 06/22/14 0152  NA 135 132*  K 5.8* 4.8  CL 106 106  CO2 17* 17*  GLUCOSE 204* 117*  BUN 49* 51*  CREATININE 2.24* 2.05*  CALCIUM 9.3 9.2  AST 22  --   ALT 18  --   ALKPHOS 92  --   BILITOT 0.5  --    ------------------------------------------------------------------------------------------------------------------ estimated creatinine clearance is 24.2 mL/min (by C-G formula based on Cr of 2.05). ------------------------------------------------------------------------------------------------------------------ No results for input(s): HGBA1C in the last 72 hours. ------------------------------------------------------------------------------------------------------------------ No  results for input(s): CHOL, HDL, LDLCALC, TRIG, CHOLHDL, LDLDIRECT in the last 72 hours. ------------------------------------------------------------------------------------------------------------------ No results for input(s): TSH, T4TOTAL, T3FREE, THYROIDAB in the last 72 hours.  Invalid input(s): FREET3 ------------------------------------------------------------------------------------------------------------------ No results for input(s): VITAMINB12, FOLATE, FERRITIN, TIBC, IRON, RETICCTPCT in the last 72 hours.  Coagulation profile  Recent Labs Lab 06/21/14 1353  INR 1.17     Recent Labs  06/21/14 1353  DDIMER 3.09*    Cardiac Enzymes  Recent Labs Lab 06/21/14 1911 06/22/14 0152 06/22/14 0702  TROPONINI 0.14* 0.17* 0.19*   ------------------------------------------------------------------------------------------------------------------ Invalid input(s): POCBNP     Time Spent in minutes  35 minutes   Jayln Branscom M.D on 06/22/2014 at 4:48 PM  Between 7am to 7pm - Pager - [phone]  After 7pm go to [email or web address]  - password [email or web address]  And look for the night coverage person covering for me after hours  [facility] Office  [phone]   **Disclaimer: This note may have been dictated with voice recognition software. Similar sounding words can inadvertently be transcribed and this note may contain transcription errors which may not have been corrected upon publication of note.**

## 2014-06-22 NOTE — Consult Note (Signed)
Patient Name: GENETTE HUERTAS Date of Encounter: 06/22/2014  Principal Problem:   Shortness of breath Active Problems:   Diabetes mellitus type 2 in obese   HYPERCHOLESTEROLEMIA   Obesity   Essential hypertension   Chronic diastolic heart failure   Abnormal Myoview 2014   OSA (obstructive sleep apnea)   Acute on chronic diastolic congestive heart failure   Acute on chronic renal insufficiency   Troponin level elevated   Positive D dimer   Acute respiratory failure   Right bundle branch block   Length of Stay: 1  SUBJECTIVE  She feels better today, improved SOB, hasn't walked yet.   CURRENT MEDS . aspirin  81 mg Oral Daily  . diltiazem  240 mg Oral QHS  . enoxaparin (LOVENOX) injection  30 mg Subcutaneous Q24H  . ferrous sulfate  325 mg Oral BID  . furosemide  80 mg Intravenous Q12H  . insulin aspart  0-5 Units Subcutaneous QHS  . insulin aspart  0-9 Units Subcutaneous TID WC  . isosorbide mononitrate  60 mg Oral Daily  . simvastatin  40 mg Oral q1800  . sodium chloride  3 mL Intravenous Q12H  . sodium chloride  3 mL Intravenous Q12H     OBJECTIVE  Filed Vitals:   06/21/14 1524 06/21/14 1548 06/21/14 2209 06/22/14 0649  BP: 128/83  160/87 124/69  Pulse: 69  86 61  Temp:   98.3 F (36.8 C) 97.6 F (36.4 C)  TempSrc:   Oral Oral  Resp: 19  20 20   Height:  5\' 5"  (1.651 m)    Weight:  210 lb (95.255 kg)  203 lb 8 oz (92.307 kg)  SpO2: 99%  100% 99%    Intake/Output Summary (Last 24 hours) at 06/22/14 0945 Last data filed at 06/22/14 0644  Gross per 24 hour  Intake    750 ml  Output    625 ml  Net    125 ml   Filed Weights   06/21/14 1548 06/22/14 0649  Weight: 210 lb (95.255 kg) 203 lb 8 oz (92.307 kg)    PHYSICAL EXAM  General: Pleasant, NAD. Neuro: Alert and oriented X 3. Moves all extremities spontaneously. Psych: Normal affect. HEENT:  Normal  Neck: Supple without bruits or JVD. Lungs:  Resp regular and unlabored, CTA. Heart: RRR no  s3, s4, or murmurs. Abdomen: Soft, non-tender, non-distended, BS + x 4.  Extremities: No clubbing, cyanosis or edema. DP/PT/Radials 2+ and equal bilaterally.  Accessory Clinical Findings  CBC  Recent Labs  06/21/14 1353  WBC 6.6  HGB 10.6*  HCT 32.0*  MCV 80.0  PLT 696   Basic Metabolic Panel  Recent Labs  06/21/14 1353 06/22/14 0152  NA 135 132*  K 5.8* 4.8  CL 106 106  CO2 17* 17*  GLUCOSE 204* 117*  BUN 49* 51*  CREATININE 2.24* 2.05*  CALCIUM 9.3 9.2   Liver Function Tests  Recent Labs  06/21/14 1353  AST 22  ALT 18  ALKPHOS 92  BILITOT 0.5  PROT 7.4  ALBUMIN 4.0    Recent Labs  06/21/14 1911 06/22/14 0152 06/22/14 0702  TROPONINI 0.14* 0.17* 0.19*   BNP Invalid input(s): POCBNP D-Dimer  Recent Labs  06/21/14 1353  DDIMER 3.09*   Radiology/Studies  Dg Chest 2 View  06/21/2014   CLINICAL DATA:  Shortness of breath. Onset of symptoms this morning.  EXAM: CHEST  2 VIEW  COMPARISON:  04/24/2013.  FINDINGS: Cardiomegaly. Aortic arch atherosclerosis. Lungs  clear. No effusion. No airspace disease. Monitoring leads project over the chest.  IMPRESSION: Cardiomegaly without failure.   Electronically Signed   By: Dereck Ligas M.D.   On: 06/21/2014 14:48   Nm Pulmonary Perf And Vent  06/21/2014   CLINICAL DATA:  Increasing shortness of breath  EXAM: NUCLEAR MEDICINE VENTILATION - PERFUSION LUNG SCAN  TECHNIQUE: Ventilation images were obtained in multiple projections using inhaled aerosol technetium 99 M DTPA. Perfusion images were obtained in multiple projections after intravenous injection of Tc-11m MAA.  RADIOPHARMACEUTICALS:  36.6 Technetium-56m DTPA aerosol inhalation and 5.8 Technetium-75m MAA IV  COMPARISON:  None.  FINDINGS: Ventilation: No focal ventilation defect. Heterogeneous ventilation.  Perfusion: No wedge shaped peripheral perfusion defects to suggest acute pulmonary embolism.  Corresponding chest radiograph is clear, noting cardiomegaly.   IMPRESSION: Negative V/Q scan.   Electronically Signed   By: Julian Hy M.D.   On: 06/21/2014 17:04    TELE: SR    ASSESSMENT AND PLAN  Ms. Jamarria Real is a very pleasant 81 year old African-American female who is followed by Dr. Pernell Dupre for her primary cardiology care. She is retired and previously had worked for almost 81 years and [employer]. She has a history of hypertension, diabetes mellitus, obesity, ? CAD, and has developed dyspnea and fatigue. She has been evaluated for sleep apnea and will be getting a new CPAP machine. She has noticed increasing shortness of breath with activity. She awakened this morning at 3 AM with shortness of breath from sleep. She presented to the emergency room where BNP is elevated at 1150, and troponin is mildly positive at 0.1. Serum creatinine has increased to 2.4 which is new from baseline and d-dimer was mildly positive at 3.0. She went for VQ scan which was just completed and this is negative for any suggestion of PE. There was heterogeneous ventilation without focal ventilation defect. She had normal perfusion. Her last echo Doppler study was in December 2014 which showed an ejection fraction at 55-60% and there was evidence for severe focal basal hypertrophy of the septum. There was grade 2 diastolic dysfunction, mitral annular calcification with mild MR, and evidence for mild pulmonary hypertension with estimated PA pressure 36 mm.  Improving Crea with held ARB. The patient is feeling better, hasn't walked yet. We will continue lasix 80 mg iv BID. Minimally elevated troponin- flat, most probably sec to acute on chronic diastolic CHF (repeat echo is pending) and acute on chronic renal failure. We will plan for a a Lexiscan stress test tomorrow.   Signed, Dorothy Spark MD, [facility] 06/22/2014

## 2014-06-23 ENCOUNTER — Other Ambulatory Visit: Payer: Self-pay

## 2014-06-23 ENCOUNTER — Ambulatory Visit (HOSPITAL_COMMUNITY)
Admit: 2014-06-23 | Discharge: 2014-06-23 | Disposition: A | Discharge status: Home / Self Care | Payer: Medicare Other | Attending: Cardiology | Admitting: Cardiology

## 2014-06-23 ENCOUNTER — Ambulatory Visit (HOSPITAL_COMMUNITY)
Admission: RE | Admit: 2014-06-23 | Discharge: 2014-06-23 | Disposition: A | Discharge status: Home / Self Care | Payer: Medicare Other | Source: Ambulatory Visit | Attending: Cardiology | Admitting: Cardiology

## 2014-06-23 DIAGNOSIS — R079 Chest pain, unspecified: Secondary | ICD-10-CM

## 2014-06-23 DIAGNOSIS — I10 Essential (primary) hypertension: Secondary | ICD-10-CM

## 2014-06-23 LAB — BASIC METABOLIC PANEL
Anion gap: 12 (ref 5–15)
BUN: 60 mg/dL — AB (ref 6–23)
CALCIUM: 9.4 mg/dL (ref 8.4–10.5)
CO2: 20 mmol/L (ref 19–32)
Chloride: 106 mmol/L (ref 96–112)
Creatinine, Ser: 2.07 mg/dL — ABNORMAL HIGH (ref 0.50–1.10)
GFR calc Af Amer: 25 mL/min — ABNORMAL LOW (ref >90)
GFR calc non Af Amer: 21 mL/min — ABNORMAL LOW (ref >90)
GLUCOSE: 128 mg/dL — AB (ref 70–99)
Potassium: 4.9 mmol/L (ref 3.5–5.1)
Sodium: 138 mmol/L (ref 135–145)

## 2014-06-23 LAB — CBC
HCT: 32.5 % — ABNORMAL LOW (ref 36.0–46.0)
HEMOGLOBIN: 11.1 g/dL — AB (ref 12.0–15.0)
MCH: 26.8 pg (ref 26.0–34.0)
MCHC: 34.2 g/dL (ref 30.0–36.0)
MCV: 78.5 fL (ref 78.0–100.0)
Platelets: 320 10*3/uL (ref 150–400)
RBC: 4.14 MIL/uL (ref 3.87–5.11)
RDW: 15.3 % (ref 11.5–15.5)
WBC: 6 10*3/uL (ref 4.0–10.5)

## 2014-06-23 LAB — GLUCOSE, CAPILLARY
GLUCOSE-CAPILLARY: 127 mg/dL — AB (ref 70–99)
GLUCOSE-CAPILLARY: 174 mg/dL — AB (ref 70–99)
Glucose-Capillary: 160 mg/dL — ABNORMAL HIGH (ref 70–99)

## 2014-06-23 MED ORDER — REGADENOSON 0.4 MG/5ML IV SOLN
INTRAVENOUS | Status: AC
  Administered 2014-06-23: 0.4 mg via INTRAVENOUS
  Filled 2014-06-23: qty 5

## 2014-06-23 MED ORDER — SITAGLIPTIN PHOSPHATE 25 MG PO TABS: 25.0000 mg | ORAL_TABLET | Freq: Every day | ORAL | Status: DC

## 2014-06-23 MED ORDER — TECHNETIUM TC 99M SESTAMIBI GENERIC - CARDIOLITE: 30.0000 | Freq: Once | INTRAVENOUS | Status: AC | PRN

## 2014-06-23 MED ORDER — REGADENOSON 0.4 MG/5ML IV SOLN
0.4000 mg | Freq: Once | INTRAVENOUS | Status: AC
  Administered 2014-06-23: 0.4 mg via INTRAVENOUS

## 2014-06-23 MED ORDER — FUROSEMIDE 10 MG/ML IJ SOLN
40.0000 mg | Freq: Two times a day (BID) | INTRAMUSCULAR | Status: DC
  Administered 2014-06-24: 40 mg via INTRAVENOUS
  Filled 2014-06-23: qty 4

## 2014-06-23 MED ORDER — TECHNETIUM TC 99M SESTAMIBI GENERIC - CARDIOLITE
10.0000 | Freq: Once | INTRAVENOUS | Status: AC | PRN
  Administered 2014-06-23: 10 via INTRAVENOUS

## 2014-06-23 MED ORDER — FUROSEMIDE 80 MG PO TABS: 80.0000 mg | ORAL_TABLET | Freq: Two times a day (BID) | ORAL | Status: DC

## 2014-06-23 NOTE — Discharge Instructions (Signed)
Angina Pectoris  Angina pectoris, often just called angina, is extreme discomfort in your chest, neck, or arm caused by a lack of blood in the middle and thickest layer of your heart wall (myocardium). It may feel like tightness or heavy pressure. It may feel like a crushing or squeezing pain. Some people say it feels like gas or indigestion. It may go down your shoulders, back, and arms. Some people may have symptoms other than pain. These symptoms include fatigue, shortness of breath, cold sweats, or nausea. There are four different types of angina:  · Stable angina--Stable angina usually occurs in episodes of predictable frequency and duration. It usually is brought on by physical activity, emotional stress, or excitement. These are all times when the myocardium needs more oxygen. Stable angina usually lasts a few minutes and often is relieved by taking a medicine that can be taken under your tongue (sublingually). The medicine is called nitroglycerin. Stable angina is caused by a buildup of plaque inside the arteries, which restricts blood flow to the heart muscle (atherosclerosis).  · Unstable angina--Unstable angina can occur even when your body experiences little or no physical exertion. It can occur during sleep. It can also occur at rest. It can suddenly increase in severity or frequency. It might not be relieved by sublingual nitroglycerin. It can last up to 30 minutes. The most common cause of unstable angina is a blood clot that has developed on the top of plaque buildup inside a coronary artery. It can lead to a heart attack if the blood clot completely blocks the artery.  · Microvascular angina--This type of angina is caused by a disorder of tiny blood vessels called arterioles. Microvascular angina is more common in women. The pain may be more severe and last longer than other types of angina pectoris.  · Prinzmetal or variant angina--This type of angina pectoris usually occurs when your body  experiences little or no physical exertion. It especially occurs in the early morning hours. It is caused by a spasm of your coronary artery.  HOME CARE INSTRUCTIONS   · Only take over-the-counter and prescription medicines as directed by your health care provider.  · Stay active or increase your exercise as directed by your health care provider.  · Limit strenuous activity as directed by your health care provider.  · Limit heavy lifting as directed by your health care provider.  · Maintain a healthy weight.  · Learn about and eat heart-healthy foods.  · Do not use any tobacco products including cigarettes, chewing tobacco or electronic cigarettes.  SEEK IMMEDIATE MEDICAL CARE IF:   You experience the following symptoms:  · Chest, neck, deep shoulder, or arm pain or discomfort that lasts more than a few minutes.  · Chest, neck, deep shoulder, or arm pain or discomfort that goes away and comes back, repeatedly.  · Heavy sweating with discomfort, without a noticeable cause.  · Shortness of breath or difficulty breathing.  · Angina that does not get better after a few minutes of rest or after taking sublingual nitroglycerin.  These can all be symptoms of a heart attack, which is a medical emergency! Get medical help at once. Call your local emergency service (911 in [place] immediately. Do not  drive yourself to the hospital and do not  wait to for your symptoms to go away.  MAKE SURE YOU:  · Understand these instructions.  · Will watch your condition.  · Will get help right away if you are not   doing well or get worse.  Document Released: 02/12/2005 Document Revised: 02/17/2013 Document Reviewed: 06/16/2013  ExitCare® Patient Information ©2015 [employer]. This information is not intended to replace advice given to you by your health care provider. Make sure you discuss any questions you have with your health care provider.

## 2014-06-23 NOTE — Clinical Documentation Improvement (Signed)
Presents with SOB, Acute on Chronic Diastolic CHF, ARF.   Acute Respiratory Failure documented  Please list or identify the Clinical Indicators/Rationale used to support the diagnosis.   Respiratory Rate's on flow sheets ranging from 18 - 20  No oxygen supplementation has been given; on Cpap at night for OSA  Oxygen saturations are ranging from 98 to 100% in room air  If you are not able to support a diagnosis of Respiratory Failure, please document if the condition was ruled out and if another respiratory condition was ruled in.  Thank you for using this Documentation Integrity Tool to help maintain regulatory compliance!  Thank You, Zoila Shutter ,RN Clinical Documentation Specialist:  [place] [employer]

## 2014-06-23 NOTE — Progress Notes (Signed)
Lexiscan MV performed, 1 day study, [facility] to read.  Rosaria Ferries, PA-C 06/23/2014 12:51 PM Beeper [phone]

## 2014-06-23 NOTE — Progress Notes (Addendum)
Patient Name: Wendy Kerr Date of Encounter: 06/23/2014  Principal Problem:   Shortness of breath Active Problems:   Diabetes mellitus type 2 in obese   HYPERCHOLESTEROLEMIA   Obesity   Essential hypertension   Chronic diastolic heart failure   Abnormal Myoview 2014   OSA (obstructive sleep apnea)   Acute on chronic diastolic congestive heart failure   Acute on chronic renal insufficiency   Troponin level elevated   Positive D dimer   Acute respiratory failure   Right bundle branch block   Length of Stay: 2  SUBJECTIVE  She feels better today, improved SOB, no CP.   CURRENT MEDS . aspirin  81 mg Oral Daily  . diltiazem  240 mg Oral QHS  . enoxaparin (LOVENOX) injection  30 mg Subcutaneous Q24H  . ferrous sulfate  325 mg Oral BID  . furosemide  80 mg Intravenous Q12H  . insulin aspart  0-5 Units Subcutaneous QHS  . insulin aspart  0-9 Units Subcutaneous TID WC  . isosorbide mononitrate  60 mg Oral Daily  . simvastatin  40 mg Oral q1800  . sodium chloride  3 mL Intravenous Q12H  . sodium chloride  3 mL Intravenous Q12H    OBJECTIVE  Filed Vitals:   06/22/14 0649 06/22/14 1320 06/22/14 2204 06/23/14 0500  BP: 124/69 106/53 135/81 143/79  Pulse: 61 56 72 69  Temp: 97.6 F (36.4 C) 97.9 F (36.6 C) 98 F (36.7 C) 97.5 F (36.4 C)  TempSrc: Oral Oral Oral Oral  Resp: 20 18 20 20   Height:      Weight: 203 lb 8 oz (92.307 kg)   201 lb 8 oz (91.4 kg)  SpO2: 99% 100% 100% 100%    Intake/Output Summary (Last 24 hours) at 06/23/14 0824 Last data filed at 06/23/14 0522  Gross per 24 hour  Intake    600 ml  Output   2850 ml  Net  -2250 ml   Filed Weights   06/21/14 1548 06/22/14 0649 06/23/14 0500  Weight: 210 lb (95.255 kg) 203 lb 8 oz (92.307 kg) 201 lb 8 oz (91.4 kg)   PHYSICAL EXAM  General: Pleasant, NAD. Neuro: Alert and oriented X 3. Moves all extremities spontaneously. Psych: Normal affect. HEENT:  Normal  Neck: Supple without bruits or  JVD. Lungs:  Resp regular and unlabored, CTA. Heart: RRR no s3, s4, or murmurs. Abdomen: Soft, non-tender, non-distended, BS + x 4.  Extremities: No clubbing, cyanosis or edema. DP/PT/Radials 2+ and equal bilaterally.  Accessory Clinical Findings  CBC  Recent Labs  06/21/14 1353 06/23/14 0502  WBC 6.6 6.0  HGB 10.6* 11.1*  HCT 32.0* 32.5*  MCV 80.0 78.5  PLT 281 952   Basic Metabolic Panel  Recent Labs  06/22/14 0152 06/23/14 0502  NA 132* 138  K 4.8 4.9  CL 106 106  CO2 17* 20  GLUCOSE 117* 128*  BUN 51* 60*  CREATININE 2.05* 2.07*  CALCIUM 9.2 9.4   Liver Function Tests  Recent Labs  06/21/14 1353  AST 22  ALT 18  ALKPHOS 92  BILITOT 0.5  PROT 7.4  ALBUMIN 4.0   Cardiac Enzymes  Recent Labs  06/21/14 1911 06/22/14 0152 06/22/14 0702  TROPONINI 0.14* 0.17* 0.19*   D-Dimer  Recent Labs  06/21/14 1353  DDIMER 3.09*    Radiology/Studies  Dg Chest 2 View  06/21/2014   CLINICAL DATA:  Shortness of breath. Onset of symptoms this morning.  EXAM: CHEST  2 VIEW  COMPARISON:  04/24/2013.  FINDINGS: Cardiomegaly. Aortic arch atherosclerosis. Lungs clear. No effusion. No airspace disease. Monitoring leads project over the chest.  IMPRESSION: Cardiomegaly without failure.   Electronically Signed   By: Dereck Ligas M.D.   On: 06/21/2014 14:48   Nm Pulmonary Perf And Vent  06/21/2014   CLINICAL DATA:  Increasing shortness of breath  EXAM: NUCLEAR MEDICINE VENTILATION - PERFUSION LUNG SCAN  TECHNIQUE: Ventilation images were obtained in multiple projections using inhaled aerosol technetium 99 M DTPA. Perfusion images were obtained in multiple projections after intravenous injection of Tc-93m MAA.  RADIOPHARMACEUTICALS:  36.6 Technetium-80m DTPA aerosol inhalation and 5.8 Technetium-81m MAA IV  COMPARISON:  None.  FINDINGS: Ventilation: No focal ventilation defect. Heterogeneous ventilation.  Perfusion: No wedge shaped peripheral perfusion defects to suggest  acute pulmonary embolism.  Corresponding chest radiograph is clear, noting cardiomegaly.  IMPRESSION: Negative V/Q scan.   Electronically Signed   By: Julian Hy M.D.   On: 06/21/2014 17:04    TELE: atrial fibrillation, rate controlled   ASSESSMENT AND PLAN  Ms. Wendy Kerr is a very pleasant 81 year old African-American female who is followed by Dr. Pernell Dupre for her primary cardiology care. She is retired and previously had worked for almost 64 years and [employer]. She has a history of hypertension, diabetes mellitus, obesity, ? CAD, and has developed dyspnea and fatigue. She has been evaluated for sleep apnea and will be getting a new CPAP machine. She has noticed increasing shortness of breath with activity. She awakened this morning at 3 AM with shortness of breath from sleep. She presented to the emergency room where BNP is elevated at 1150, and troponin is mildly positive at 0.1. Serum creatinine has increased to 2.4 which is new from baseline and d-dimer was mildly positive at 3.0. She went for VQ scan which was just completed and this is negative for any suggestion of PE. There was heterogeneous ventilation without focal ventilation defect. She had normal perfusion. Her last echo Doppler study was in December 2014 which showed an ejection fraction at 55-60% and there was evidence for severe focal basal hypertrophy of the septum. There was grade 2 diastolic dysfunction, mitral annular calcification with mild MR, and evidence for mild pulmonary hypertension with estimated PA pressure 36 mm.  Improving Crea with held ARB. The patient is feeling better, hasn't walked yet. Excellent diuresis, 9 lbs in 2 days. We will decrease lasix to 80 mg PO BID. We will plan for a a Lexiscan stress test today. If negative she can be discharged on lasix 80 mg po BID and follow with Dr Tamala Julian in the clinic.    Signed, Dorothy Spark MD, [facility] 06/23/2014

## 2014-06-23 NOTE — Progress Notes (Signed)
Received report from [place], Therapist, sports. I have reviewed and agree with her assessment. I will be assuming care from now until 0700. Pt resting comfortably in bed with no complaints of pain. Will continue to monitor.

## 2014-06-23 NOTE — Progress Notes (Signed)
Patient ID: Wendy Kerr, female   DOB: Feb 29, 1930, 81 y.o.   MRN: [id number]  [facility] PROGRESS NOTE  Wendy Kerr [id number] DOB: 04-12-1930 DOA: 06/21/2014 PCP: Sherian Maroon, MD   Brief narrative:    81 y.o. female with HTN, DM (checks her sugars 2x a day, and reports her sugars are 100s-150s typically), MGUS, diastolic CHF (EF 27-78% by Echo 2014),and an abnormal Myoview in 2014 as well as severe OSA, CKD stage II - III, who presented with main concern of one week duration of progressive dyspnea with exertion that has gotten worse one day piror to this admission and has progressed to dyspnea at rest. She has denies fevers, chills, cough. Pt did reports substernal chest tightness, non radiating, pressure like, no specific alleviating factors, worse with exertion.   In ED, pt noted to be hemodynamically stable but in mild distress due to dyspnea, VSS including oxygen saturation and RR. Cardiac enzymes elevated at 0.14 and trending up to 0.17. Cardiology consulted and [facility] asked to admit for further evaluation.   Assessment/Plan:    Principal Problem:   Acute respiratory distress with no hypoxia - based on reported symptoms and physical exam finding of crackles on lung exam, presumed to be related to acute diastolic CHF and possibly cardiac etiology angina/ACS given elevated troponins  - pt has known history of diastolic CHF grade II based on last doppler study in 2014 with EF 55 - 60% - BNP was elevated on admission but CXR with no frank vascular congestion. VQ negative for PE - stress test done today indicative of EF 33% with no evidence of ischemia - pt is currently responding well to Lasix IV 80 mg BID, weight is trending down from 210 lbs --> 201 lbs this AM - plan is to obtain 2 D ECHO in AM, change Lasix to 40 mg IV BID   Active Problems:  Diabetes mellitus type 2 in obese / Obesity with complications of CKD stage II - III - metformin will have to be  discontinued give pt's acute on chronic kidney disease - last A1C 3 months ago was 6.9 - SSI, insulin sensitive scale Q AC/HS. - consider Januvia upon discharge    HYPERCHOLESTEROLEMIA - Continue Zocor.    Hyperkalemia - resolved and K is now NWL - repeat BMP in AM   Accelerated HTN - Continue Cardizem, Imdur, Lasix.   Elevated troponin - stress test with EF 33% but no evidence of ischemia - Cardiology assistance appreciated  - continue to follow up on recommendations      Bradycardia - continue to hold Labetalol  - HR now at target range    OSA (obstructive sleep apnea) -S/P sleep study 05/11/14.  -Bipap Q HS per resp.   Acute on chronic kidney disease, stage II - III - Baseline creatinine 1.4. Continue to hold Cozaar and Metformin.  - Diurese and follow. May be from CHF exacerbation.    Obesity - Body mass index is 33.5   Positive D dimer - VQ negative.  DVT prophylaxis - Lovenox SQ  Code Status: Full.  Family Communication:  plan of care discussed with the patient Disposition Plan: Discharge barrier - EF on stress test 33%, per cardiology team, needs 2 D ECHO in AM   IV access:  Peripheral IV  Procedures and diagnostic studies:    Dg Chest 2 View  06/21/2014   Cardiomegaly without failure.     Nm Myocar Multi W/spect W/wall Motion / Ef  06/23/2014  Intermediate risk study because of moderately reduced EF (33%). No evidence for ischemia or infarction by perfusion images.    Nm Pulmonary Perf And Vent  06/21/2014   Negative I/Q scan.     Medical Consultants:  Cardiology   Other Consultants:  None   IAnti-Infectives:   None   Wendy Ramsay, MD  [facility] Pager [phone]  If 7PM-7AM, please contact night-coverage [email or web address] Password [facility] 06/23/2014, 6:57 PM   LOS: 2 days   HPI/Subjective: Still with mild exertional dyspnea, no chest pain this AM.   Objective: Filed Vitals:   06/22/14 1320 06/22/14 2204 06/23/14 0500 06/23/14 1500   BP: 106/53 135/81 143/79 164/91  Pulse: 56 72 69 81  Temp: 97.9 F (36.6 C) 98 F (36.7 C) 97.5 F (36.4 C) 97.7 F (36.5 C)  TempSrc: Oral Oral Oral Oral  Resp: 18 20 20 18   Height:      Weight:   91.4 kg (201 lb 8 oz)   SpO2: 100% 100% 100% 100%    Intake/Output Summary (Last 24 hours) at 06/23/14 1857 Last data filed at 06/23/14 0745  Gross per 24 hour  Intake      0 ml  Output   2450 ml  Net  -2450 ml    Exam:   General:  Pt is alert, follows commands appropriately, not in acute distress  Cardiovascular: Regular rate and rhythm, S1/S2, no murmurs, no rubs, no gallops  Respiratory: Clear to auscultation bilaterally, no wheezing, no crackles, no rhonchi  Abdomen: Soft, non tender, non distended, bowel sounds present, no guarding  Extremities: No edema, pulses DP and PT palpable bilaterally  Neuro: Grossly nonfocal  Data Reviewed: Basic Metabolic Panel:  Recent Labs Lab 06/21/14 1353 06/22/14 0152 06/23/14 0502  NA 135 132* 138  K 5.8* 4.8 4.9  CL 106 106 106  CO2 17* 17* 20  GLUCOSE 204* 117* 128*  BUN 49* 51* 60*  CREATININE 2.24* 2.05* 2.07*  CALCIUM 9.3 9.2 9.4   Liver Function Tests:  Recent Labs Lab 06/21/14 1353  AST 22  ALT 18  ALKPHOS 92  BILITOT 0.5  PROT 7.4  ALBUMIN 4.0   CBC:  Recent Labs Lab 06/21/14 1353 06/23/14 0502  WBC 6.6 6.0  HGB 10.6* 11.1*  HCT 32.0* 32.5*  MCV 80.0 78.5  PLT 281 320   Cardiac Enzymes:  Recent Labs Lab 06/21/14 1911 06/22/14 0152 06/22/14 0702  TROPONINI 0.14* 0.17* 0.19*   CBG:  Recent Labs Lab 06/22/14 1132 06/22/14 1615 06/22/14 2202 06/23/14 0734 06/23/14 1611  GLUCAP 144* 132* 134* 127* 160*    Scheduled Meds: . aspirin  81 mg Oral Daily  . diltiazem  240 mg Oral QHS  . enoxaparin  injection  30 mg Subcutaneous Q24H  . ferrous sulfate  325 mg Oral BID  . furosemide  80 mg Intravenous Q12H  . insulin aspart  0-5 Units Subcutaneous QHS  . insulin aspart  0-9 Units  Subcutaneous TID WC  . isosorbide mononitrate  60 mg Oral Daily  . simvastatin  40 mg Oral q1800   Continuous Infusions:

## 2014-06-23 NOTE — Progress Notes (Signed)
    Nuc negative for ischemic but newly reduced EF. WIll need to wait on 2D ECHO before discharge. Dr. Meda Coffee will see in AM.   Angelena Form PA-C  [facility]

## 2014-06-23 NOTE — Progress Notes (Signed)
[facility] dispatch notified about need for transport for appointment at [facility] at 1000. Spoke to [employer], dispatcher/secretary of [facility] at [employer].

## 2014-06-24 DIAGNOSIS — I509 Heart failure, unspecified: Secondary | ICD-10-CM

## 2014-06-24 DIAGNOSIS — N289 Disorder of kidney and ureter, unspecified: Secondary | ICD-10-CM

## 2014-06-24 DIAGNOSIS — R9430 Abnormal result of cardiovascular function study, unspecified: Secondary | ICD-10-CM

## 2014-06-24 LAB — BASIC METABOLIC PANEL
Anion gap: 11 (ref 5–15)
BUN: 54 mg/dL — AB (ref 6–23)
CO2: 20 mmol/L (ref 19–32)
CREATININE: 1.81 mg/dL — AB (ref 0.50–1.10)
Calcium: 9.4 mg/dL (ref 8.4–10.5)
Chloride: 105 mmol/L (ref 96–112)
GFR calc Af Amer: 29 mL/min — ABNORMAL LOW (ref >90)
GFR calc non Af Amer: 25 mL/min — ABNORMAL LOW (ref >90)
GLUCOSE: 132 mg/dL — AB (ref 70–99)
POTASSIUM: 4.1 mmol/L (ref 3.5–5.1)
Sodium: 136 mmol/L (ref 135–145)

## 2014-06-24 LAB — CBC
HEMATOCRIT: 34.6 % — AB (ref 36.0–46.0)
Hemoglobin: 11.5 g/dL — ABNORMAL LOW (ref 12.0–15.0)
MCH: 26.2 pg (ref 26.0–34.0)
MCHC: 33.2 g/dL (ref 30.0–36.0)
MCV: 78.8 fL (ref 78.0–100.0)
PLATELETS: 345 10*3/uL (ref 150–400)
RBC: 4.39 MIL/uL (ref 3.87–5.11)
RDW: 15.4 % (ref 11.5–15.5)
WBC: 7.2 10*3/uL (ref 4.0–10.5)

## 2014-06-24 LAB — GLUCOSE, CAPILLARY
Glucose-Capillary: 127 mg/dL — ABNORMAL HIGH (ref 70–99)
Glucose-Capillary: 133 mg/dL — ABNORMAL HIGH (ref 70–99)

## 2014-06-24 MED ORDER — HYDRALAZINE HCL 25 MG PO TABS
25.0000 mg | ORAL_TABLET | Freq: Three times a day (TID) | ORAL | Status: DC
  Administered 2014-06-24: 25 mg via ORAL
  Filled 2014-06-24: qty 1

## 2014-06-24 MED ORDER — DILTIAZEM HCL ER COATED BEADS 120 MG PO CP24: 120.0000 mg | ORAL_CAPSULE | Freq: Every day | ORAL | Status: DC

## 2014-06-24 MED ORDER — HYDRALAZINE HCL 25 MG PO TABS: 25.0000 mg | ORAL_TABLET | Freq: Three times a day (TID) | ORAL | Status: DC

## 2014-06-24 MED ORDER — FUROSEMIDE 40 MG PO TABS: 80.0000 mg | ORAL_TABLET | Freq: Two times a day (BID) | ORAL | Status: DC

## 2014-06-24 NOTE — Progress Notes (Signed)
PT Cancellation Note  Patient Details Name: Wendy Kerr MRN: [id number] DOB: [birth year]   Cancelled Treatment:    Reason Eval/Treat Not Completed: PT screened, no needs identified, will sign off. Pt feeling better and mobilizing well-walked earlier with nursing and walked again briefly for PT to see-at supervision level. No acute PT needs. Will sign off.    Weston Anna, MPT Pager: [phone]

## 2014-06-24 NOTE — Discharge Summary (Signed)
Physician Discharge Summary  Wendy Kerr UDJ:[id number] DOB: Mar 11, 1930 DOA: 06/21/2014  PCP: Sherian Maroon, MD  Admit date: 06/21/2014 Discharge date: 06/24/2014  Recommendations for Outpatient Follow-up:  1. Pt will need to follow up with PCP in 2-3 weeks post discharge 2. Please obtain BMP to evaluate electrolytes and kidney function 3. Please also check CBC to evaluate Hg and Hct levels 4. Please note that we held pt's medication Metformin and Losartan upon discharge until renal function stabilizes   Discharge Diagnoses:  Principal Problem:   Shortness of breath Active Problems:   Diabetes mellitus type 2 in obese   HYPERCHOLESTEROLEMIA   Obesity   Essential hypertension   Chronic diastolic heart failure   Abnormal Myoview 2014   OSA (obstructive sleep apnea)   Acute on chronic diastolic congestive heart failure   Acute on chronic renal insufficiency   Troponin level elevated   Positive D dimer   Acute respiratory failure   Right bundle branch block   Discharge Condition: Stable  Diet recommendation: Heart healthy diet discussed in details   History of present illness:  81 y.o. female with HTN, DM (checks her sugars 2x a day, and reports her sugars are 100s-150s typically), MGUS, diastolic CHF (EF 58-85% by Echo 2014),and an abnormal Myoview in 2014 as well as severe OSA, CKD stage II - III, who presented with main concern of one week duration of progressive dyspnea with exertion that has gotten worse one day piror to this admission and has progressed to dyspnea at rest. She has denies fevers, chills, cough. Pt did reports substernal chest tightness, non radiating, pressure like, no specific alleviating factors, worse with exertion.   In ED, pt noted to be hemodynamically stable but in mild distress due to dyspnea, VSS including oxygen saturation and RR. Cardiac enzymes elevated at 0.14 and trending up to 0.17. Cardiology consulted and [facility] asked to admit for  further evaluation.   Assessment/Plan:    Principal Problem:  Acute respiratory distress with no hypoxia - based on reported symptoms and physical exam finding of crackles on lung exam, presumed to be related to acute diastolic CHF and possibly cardiac etiology angina/ACS given elevated troponins  - pt has known history of diastolic CHF grade II based on last doppler study in 2014 with EF 55 - 60% - BNP was elevated on admission but CXR with no frank vascular congestion. VQ negative for PE - stress test done indicative of EF 33% with no evidence of ischemia - pt is currently responding well to Lasix IV 80 mg BID, weight is trending down from 210 lbs --> 201 lbs --> 195 lbs this AM - 2 D ECHO with ED 55%, cariology cleared pt for discharge   Active Problems:  Diabetes mellitus type 2 in obese / Obesity with complications of CKD stage II - III - metformin will have to be discontinued given pt's acute on chronic kidney disease - last A1C 3 months ago was 6.9 - started Januvia upon discharge    HYPERCHOLESTEROLEMIA - Continue Zocor.   Hyperkalemia - resolved and K is now NWL   Accelerated HTN - Continue Cardizem, Imdur, Lasix.   Elevated troponin - stress test with EF 33% but no evidence of ischemia - Cardiology assistance appreciated    Bradycardia - continue to hold Labetalol  - HR now at target range    OSA (obstructive sleep apnea) -S/P sleep study 05/11/14.  -Bipap Q HS per resp.   Acute on chronic kidney disease, stage  II - III - Baseline creatinine 1.4. Continue to hold Cozaar and Metformin.  - Diurese and follow.   Obesity - Body mass index is 33.5   Positive D dimer - VQ negative.  Code Status: Full.  Family Communication: plan of care discussed with the patient Disposition Plan: Discharge home  IV access:  Peripheral IV  Procedures and diagnostic studies:   Dg Chest 2 View 06/21/2014 Cardiomegaly without failure.    Nm Myocar Multi W/spect W/wall Motion / Ef 06/23/2014 Intermediate risk study because of moderately reduced EF (33%). No evidence for ischemia or infarction by perfusion images.   Nm Pulmonary Perf And Vent 06/21/2014 Negative I/Q scan.   Medical Consultants:  Cardiology   Other Consultants:  None   IAnti-Infectives:   None        Discharge Exam: Filed Vitals:   06/24/14 0457  BP: 131/75  Pulse: 72  Temp: 97.8 F (36.6 C)  Resp: 18   Filed Vitals:   06/23/14 0500 06/23/14 1500 06/23/14 2104 06/24/14 0457  BP: 143/79 164/91 118/65 131/75  Pulse: 69 81 80 72  Temp: 97.5 F (36.4 C) 97.7 F (36.5 C) 97.7 F (36.5 C) 97.8 F (36.6 C)  TempSrc: Oral Oral Oral Oral  Resp: 20 18 18 18   Height:      Weight: 91.4 kg (201 lb 8 oz)   88.678 kg (195 lb 8 oz)  SpO2: 100% 100% 96% 99%    General: Pt is alert, follows commands appropriately, not in acute distress Cardiovascular: Regular rate and rhythm, no rubs, no gallops Respiratory: Clear to auscultation bilaterally, no wheezing, no crackles, no rhonchi Abdominal: Soft, non tender, non distended, bowel sounds +, no guarding Extremities: no cyanosis, pulses palpable bilaterally DP and PT Neuro: Grossly nonfocal  Discharge Instructions     Medication List    STOP taking these medications        labetalol 200 MG tablet  Commonly known as:  NORMODYNE     losartan 100 MG tablet  Commonly known as:  COZAAR     metFORMIN 500 MG tablet  Commonly known as:  GLUCOPHAGE      TAKE these medications        aspirin 81 MG tablet  Take 81 mg by mouth every evening.     diltiazem 120 MG 24 hr capsule  Commonly known as:  CARDIZEM CD  Take 1 capsule (120 mg total) by mouth daily.     ferrous sulfate 325 (65 FE) MG tablet  TAKE 1 TABLET BY MOUTH TWICE DAILY WITH 500 MG VITAMIN C     furosemide 80 MG tablet  Commonly known as:  LASIX  Take 1 tablet (80 mg total) by mouth 2 (two) times daily.      GERITOL TONIC PO  Take 5 mLs by mouth once a week.     hydrALAZINE 25 MG tablet  Commonly known as:  APRESOLINE  Take 1 tablet (25 mg total) by mouth 3 (three) times daily.     isosorbide mononitrate 60 MG 24 hr tablet  Commonly known as:  IMDUR  Take 1 tablet (60 mg total) by mouth every morning.     multivitamin with minerals Tabs tablet  Take 1 tablet by mouth every morning.     nitroGLYCERIN 0.4 MG SL tablet  Commonly known as:  NITROSTAT  Place 1 tablet (0.4 mg total) under the tongue every 5 (five) minutes as needed for chest pain.     simvastatin 40 MG  tablet  Commonly known as:  ZOCOR  Take 1 tablet (40 mg total) by mouth daily at 6 PM.     sitaGLIPtin 25 MG tablet  Commonly known as:  JANUVIA  Take 1 tablet (25 mg total) by mouth daily.     Vitamin D (Ergocalciferol) 50000 UNITS Caps capsule  Commonly known as:  DRISDOL  Take 50,000 Units by mouth every 7 (seven) days.            Follow-up Information    Follow up with Sherian Maroon, MD.   Specialty:  Family Medicine   Contact information:   [address] [address] [phone]       Follow up with Faye Ramsay, MD.   Specialty:  Internal Medicine   Why:  As needed   Contact information:   [address] [place] [address] [phone]        The results of significant diagnostics from this hospitalization (including imaging, microbiology, ancillary and laboratory) are listed below for reference.     Microbiology: No results found for this or any previous visit (from the past 240 hour(s)).   Labs: Basic Metabolic Panel:  Recent Labs Lab 06/21/14 1353 06/22/14 0152 06/23/14 0502 06/24/14 0511  NA 135 132* 138 136  K 5.8* 4.8 4.9 4.1  CL 106 106 106 105  CO2 17* 17* 20 20  GLUCOSE 204* 117* 128* 132*  BUN 49* 51* 60* 54*  CREATININE 2.24* 2.05* 2.07* 1.81*  CALCIUM 9.3 9.2 9.4 9.4   Liver Function Tests:  Recent Labs Lab  06/21/14 1353  AST 22  ALT 18  ALKPHOS 92  BILITOT 0.5  PROT 7.4  ALBUMIN 4.0   CBC:  Recent Labs Lab 06/21/14 1353 06/23/14 0502 06/24/14 0511  WBC 6.6 6.0 7.2  HGB 10.6* 11.1* 11.5*  HCT 32.0* 32.5* 34.6*  MCV 80.0 78.5 78.8  PLT 281 320 345   Cardiac Enzymes:  Recent Labs Lab 06/21/14 1911 06/22/14 0152 06/22/14 0702  TROPONINI 0.14* 0.17* 0.19*   BNP: BNP (last 3 results)  Recent Labs  06/21/14 1353  BNP 1150.9*   CBG:  Recent Labs Lab 06/22/14 2202 06/23/14 0734 06/23/14 1611 06/23/14 2103 06/24/14 0740  GLUCAP 134* 127* 160* 174* 127*   SIGNED: Time coordinating discharge: 30 minutes  MAGICK-Arianny Pun, MD  [facility] 06/24/2014, 12:58 PM Pager [phone]  If 7PM-7AM, please contact night-coverage [email or web address] Password [email or web address]

## 2014-06-24 NOTE — Progress Notes (Signed)
  Echocardiogram 2D Echocardiogram has been performed.  Wendy Kerr 06/24/2014, 9:15 AM

## 2014-06-24 NOTE — Progress Notes (Signed)
Patient Name: Wendy Kerr Date of Encounter: 06/24/2014  Principal Problem:   Shortness of breath Active Problems:   Diabetes mellitus type 2 in obese   HYPERCHOLESTEROLEMIA   Obesity   Essential hypertension   Chronic diastolic heart failure   Abnormal Myoview 2014   OSA (obstructive sleep apnea)   Acute on chronic diastolic congestive heart failure   Acute on chronic renal insufficiency   Troponin level elevated   Positive D dimer   Acute respiratory failure   Right bundle branch block   Length of Stay: 3  SUBJECTIVE  She feels better today, improved SOB, no CP.   CURRENT MEDS . aspirin  81 mg Oral Daily  . diltiazem  120 mg Oral QHS  . enoxaparin (LOVENOX) injection  30 mg Subcutaneous Q24H  . ferrous sulfate  325 mg Oral BID  . furosemide  80 mg Oral BID  . hydrALAZINE  25 mg Oral TID  . insulin aspart  0-5 Units Subcutaneous QHS  . insulin aspart  0-9 Units Subcutaneous TID WC  . isosorbide mononitrate  60 mg Oral Daily  . simvastatin  40 mg Oral q1800  . sodium chloride  3 mL Intravenous Q12H  . sodium chloride  3 mL Intravenous Q12H    OBJECTIVE  Filed Vitals:   06/23/14 1500 06/23/14 2104 06/24/14 0457 06/24/14 1407  BP: 164/91 118/65 131/75 139/64  Pulse: 81 80 72 85  Temp: 97.7 F (36.5 C) 97.7 F (36.5 C) 97.8 F (36.6 C) 98.8 F (37.1 C)  TempSrc: Oral Oral Oral Oral  Resp: 18 18 18 20   Height:      Weight:   195 lb 8 oz (88.678 kg)   SpO2: 100% 96% 99% 100%    Intake/Output Summary (Last 24 hours) at 06/24/14 1432 Last data filed at 06/24/14 1345  Gross per 24 hour  Intake    480 ml  Output   2400 ml  Net  -1920 ml   Filed Weights   06/22/14 0649 06/23/14 0500 06/24/14 0457  Weight: 203 lb 8 oz (92.307 kg) 201 lb 8 oz (91.4 kg) 195 lb 8 oz (88.678 kg)   PHYSICAL EXAM  General: Pleasant, NAD. Neuro: Alert and oriented X 3. Moves all extremities spontaneously. Psych: Normal affect. HEENT:  Normal  Neck: Supple without  bruits or JVD. Lungs:  Resp regular and unlabored, CTA. Heart: RRR no s3, s4, or murmurs. Abdomen: Soft, non-tender, non-distended, BS + x 4.  Extremities: No clubbing, cyanosis or edema. DP/PT/Radials 2+ and equal bilaterally.  Accessory Clinical Findings  CBC  Recent Labs  06/23/14 0502 06/24/14 0511  WBC 6.0 7.2  HGB 11.1* 11.5*  HCT 32.5* 34.6*  MCV 78.5 78.8  PLT 320 035   Basic Metabolic Panel  Recent Labs  06/23/14 0502 06/24/14 0511  NA 138 136  K 4.9 4.1  CL 106 105  CO2 20 20  GLUCOSE 128* 132*  BUN 60* 54*  CREATININE 2.07* 1.81*  CALCIUM 9.4 9.4   Liver Function Tests No results for input(s): AST, ALT, ALKPHOS, BILITOT, PROT, ALBUMIN in the last 72 hours. Cardiac Enzymes  Recent Labs  06/21/14 1911 06/22/14 0152 06/22/14 0702  TROPONINI 0.14* 0.17* 0.19*   D-Dimer No results for input(s): DDIMER in the last 72 hours.  Radiology/Studies  Dg Chest 2 View  06/21/2014   CLINICAL DATA:  Shortness of breath. Onset of symptoms this morning.  EXAM: CHEST  2 VIEW  COMPARISON:  04/24/2013.  FINDINGS: Cardiomegaly. Aortic arch atherosclerosis. Lungs clear. No effusion. No airspace disease. Monitoring leads project over the chest.  IMPRESSION: Cardiomegaly without failure.   Electronically Signed   By: Dereck Ligas M.D.   On: 06/21/2014 14:48   Nm Pulmonary Perf And Vent  06/21/2014   CLINICAL DATA:  Increasing shortness of breath  EXAM: NUCLEAR MEDICINE VENTILATION - PERFUSION LUNG SCAN  TECHNIQUE: Ventilation images were obtained in multiple projections using inhaled aerosol technetium 99 M DTPA. Perfusion images were obtained in multiple projections after intravenous injection of Tc-8m MAA.  RADIOPHARMACEUTICALS:  36.6 Technetium-24m DTPA aerosol inhalation and 5.8 Technetium-60m MAA IV  COMPARISON:  None.  FINDINGS: Ventilation: No focal ventilation defect. Heterogeneous ventilation.  Perfusion: No wedge shaped peripheral perfusion defects to suggest  acute pulmonary embolism.  Corresponding chest radiograph is clear, noting cardiomegaly.  IMPRESSION: Negative V/Q scan.   Electronically Signed   By: Julian Hy M.D.   On: 06/21/2014 17:04   Echo: 06/23/2014 Left ventricle: The cavity size was normal. There was moderate concentric hypertrophy. Systolic function was normal. The estimated ejection fraction was in the range of 50% to 55%. Wall motion was normal; there were no regional wall motion abnormalities. Features are consistent with a pseudonormal left ventricular filling pattern, with concomitant abnormal relaxation and increased filling pressure (grade 2 diastolic dysfunction). Doppler parameters are consistent with high ventricular filling pressure. - Aortic valve: Moderate diffuse thickening and calcification, consistent with sclerosis. - Mitral valve: There was trivial regurgitation. - Right ventricle: The cavity size was moderately dilated. Wall thickness was normal. Systolic function was moderately to severely reduced. - Right atrium: The atrium was moderately dilated. - Tricuspid valve: There was moderate regurgitation. - Pulmonic valve: There was trivial regurgitation. - Pulmonary arteries: PA peak pressure: 61 mm Hg (S). - Pericardium, extracardiac: A trivial, free-flowing pericardial effusion was identified circumferential to the heart. The fluid had no internal echoes.  Impressions:  - The right ventricular systolic pressure was increased consistent with moderate pulmonary hypertension.  TELE: atrial fibrillation, rate controlled   ASSESSMENT AND PLAN  Ms. Wendy Kerr is a very pleasant 81 year old African-American female who is followed by Dr. Pernell Dupre for her primary cardiology care. She is retired and previously had worked for almost 64 years and [employer]. She has a history of hypertension, diabetes mellitus, obesity, ? CAD, and has developed dyspnea and  fatigue. She has been evaluated for sleep apnea and will be getting a new CPAP machine. She has noticed increasing shortness of breath with activity. She awakened this morning at 3 AM with shortness of breath from sleep. She presented to the emergency room where BNP is elevated at 1150, and troponin is mildly positive at 0.1. Serum creatinine has increased to 2.4 which is new from baseline and d-dimer was mildly positive at 3.0. She went for VQ scan which was just completed and this is negative for any suggestion of PE. There was heterogeneous ventilation without focal ventilation defect. She had normal perfusion. Her last echo Doppler study was in December 2014 which showed an ejection fraction at 55-60% and there was evidence for severe focal basal hypertrophy of the septum. There was grade 2 diastolic dysfunction, mitral annular calcification with mild MR, and evidence for mild pulmonary hypertension with estimated PA pressure 36 mm.  Improving Crea with held ARB. The patient is feeling better, hasn't walked yet. Excellent diuresis, 9 lbs in 2 days. We will decrease lasix to 80 mg PO BID.  Her Lexiscan  nuclear stress test performed yesterday was negative for ischemia however showed decreased overall left ventricular ejection fraction of 33%. We performed echocardiogram to confirm and in fact it shows normal left vertebral ejection fraction in the range 50-55% with grade 2 diastolic dysfunction. We'll focus on good blood pressure and diuresis management, patient looks and feels significantly better, will discharge today and arrange for follow-up with Dr. Tamala Julian.  Signed, Dorothy Spark MD, [facility] 06/24/2014

## 2014-06-28 ENCOUNTER — Telehealth: Payer: Self-pay | Admitting: Interventional Cardiology

## 2014-06-28 ENCOUNTER — Encounter: Payer: Self-pay | Admitting: Interventional Cardiology

## 2014-06-28 ENCOUNTER — Ambulatory Visit (INDEPENDENT_AMBULATORY_CARE_PROVIDER_SITE_OTHER): Payer: Medicare Other | Admitting: Interventional Cardiology

## 2014-06-28 VITALS — BP 132/92 | HR 105 | Ht 65.0 in | Wt 194.4 lb

## 2014-06-28 DIAGNOSIS — G4733 Obstructive sleep apnea (adult) (pediatric): Secondary | ICD-10-CM

## 2014-06-28 DIAGNOSIS — I4510 Unspecified right bundle-branch block: Secondary | ICD-10-CM | POA: Diagnosis not present

## 2014-06-28 DIAGNOSIS — N289 Disorder of kidney and ureter, unspecified: Secondary | ICD-10-CM

## 2014-06-28 DIAGNOSIS — I5032 Chronic diastolic (congestive) heart failure: Secondary | ICD-10-CM

## 2014-06-28 DIAGNOSIS — I10 Essential (primary) hypertension: Secondary | ICD-10-CM | POA: Diagnosis not present

## 2014-06-28 DIAGNOSIS — I4892 Unspecified atrial flutter: Secondary | ICD-10-CM

## 2014-06-28 DIAGNOSIS — I484 Atypical atrial flutter: Secondary | ICD-10-CM

## 2014-06-28 DIAGNOSIS — N179 Acute kidney failure, unspecified: Secondary | ICD-10-CM

## 2014-06-28 DIAGNOSIS — N189 Chronic kidney disease, unspecified: Secondary | ICD-10-CM

## 2014-06-28 LAB — CBC WITH DIFFERENTIAL/PLATELET
BASOS ABS: 0 10*3/uL (ref 0.0–0.1)
BASOS PCT: 0.6 % (ref 0.0–3.0)
EOS ABS: 0.2 10*3/uL (ref 0.0–0.7)
Eosinophils Relative: 2.8 % (ref 0.0–5.0)
HCT: 35.9 % — ABNORMAL LOW (ref 36.0–46.0)
Hemoglobin: 12.1 g/dL (ref 12.0–15.0)
LYMPHS ABS: 1.1 10*3/uL (ref 0.7–4.0)
Lymphocytes Relative: 14.6 % (ref 12.0–46.0)
MCHC: 33.7 g/dL (ref 30.0–36.0)
MCV: 80.2 fl (ref 78.0–100.0)
Monocytes Absolute: 0.9 10*3/uL (ref 0.1–1.0)
Monocytes Relative: 12.9 % — ABNORMAL HIGH (ref 3.0–12.0)
NEUTROS PCT: 69.1 % (ref 43.0–77.0)
Neutro Abs: 5 10*3/uL (ref 1.4–7.7)
PLATELETS: 441 10*3/uL — AB (ref 150.0–400.0)
RBC: 4.48 Mil/uL (ref 3.87–5.11)
RDW: 15.6 % — ABNORMAL HIGH (ref 11.5–15.5)
WBC: 7.3 10*3/uL (ref 4.0–10.5)

## 2014-06-28 LAB — BASIC METABOLIC PANEL
BUN: 59 mg/dL — ABNORMAL HIGH (ref 6–23)
CALCIUM: 9.8 mg/dL (ref 8.4–10.5)
CHLORIDE: 102 meq/L (ref 96–112)
CO2: 19 meq/L (ref 19–32)
CREATININE: 1.92 mg/dL — AB (ref 0.40–1.20)
GFR: 32.16 mL/min — ABNORMAL LOW (ref >60.00)
GLUCOSE: 125 mg/dL — AB (ref 70–99)
POTASSIUM: 4.2 meq/L (ref 3.5–5.1)
SODIUM: 132 meq/L — AB (ref 135–145)

## 2014-06-28 LAB — BRAIN NATRIURETIC PEPTIDE: Pro B Natriuretic peptide (BNP): 1332 pg/mL — ABNORMAL HIGH (ref 0.0–100.0)

## 2014-06-28 MED ORDER — APIXABAN 2.5 MG PO TABS: 2.5000 mg | ORAL_TABLET | Freq: Two times a day (BID) | ORAL | Status: DC

## 2014-06-28 MED ORDER — DILTIAZEM HCL ER COATED BEADS 240 MG PO CP24: 240.0000 mg | ORAL_CAPSULE | Freq: Every day | ORAL | Status: DC

## 2014-06-28 NOTE — Patient Instructions (Signed)
Medication Instructions:  1)START Eliquis 2.5mg  Twice daily. An Rx has been sent to your pharmacy 2) INCREASE Diltiazem to 240mg  daily. An Rx has been sent to your pharmact  Labwork: Bmet, Cbc, Bnp Today  Testing/Procedures: Your physician has recommended that you wear a holter monitor. Holter monitors are medical devices that record the heart's electrical activity. Doctors most often use these monitors to diagnose arrhythmias. Arrhythmias are problems with the speed or rhythm of the heartbeat. The monitor is a small, portable device. You can wear one while you do your normal daily activities. This is usually used to diagnose what is causing palpitations/syncope (passing out). (To be scheduled in 1 week 07/05/14)   Follow-Up: You have a follow up appt scheduled on 07/20/14 @9 :45am  Any Other Special Instructions Will Be Listed Below (If Applicable). Your physician recommends that you weigh, daily, at the same time every day, and in the same amount of clothing. Please record your daily weights. Call the office today with baseline weight. Call the office if you are up 5lbs or more

## 2014-06-28 NOTE — Telephone Encounter (Signed)
New problem    Pt's weight is 193.7 per son calling.

## 2014-06-28 NOTE — Progress Notes (Signed)
Cardiology Office Note   Date:  06/28/2014   ID:  Wendy Kerr, DOB 09-05-1930, MRN [id number]  PCP:  Sherian Maroon, MD  Cardiologist:   Sinclair Grooms, MD   Chief Complaint  Patient presents with  . Atrial Flutter      History of Present Illness: Wendy Kerr is a 81 y.o. female who presents for diastolic heart failure (acute on chronic), atrial flutter (new since last office visit and present during recent hospitalization not recognized in April 2016), essential hypertension, chronic kidney disease, diabetes mellitus, and severe obstructive sleep apnea.  Wendy Kerr was hospitalized on 06/21/2014 and discharged on 06/24/2014 by Dr. Garwin Brothers. The admitting diagnosis was acute on chronic diastolic heart failure. Aggressive diuresis and medication readjustments were made. She diuresis from a weight of 210 pounds down to 195 pounds. Her breathing is improved. She has some exertional intolerance. She denies chest pain. A myocardial perfusion study was negative for evidence of ischemia. EF was documented to be 55% by echo and 33% by nuclear perfusion imaging. She denies chest pain.    Past Medical History  Diagnosis Date  . Bronchitis, mucopurulent recurrent   . Hypertension   . Diabetes mellitus   . Hypercholesterolemia   . Obesity   . Diverticulosis of colon   . Benign neoplasm of pancreas, except islets of Langerhans   . DJD (degenerative joint disease)   . Vitamin D deficiency   . Common migraine   . Generalized anxiety disorder   . Anemia   . Monoclonal gammopathy   . OSA (obstructive sleep apnea)     severe with AHI 37 events per hour    Past Surgical History  Procedure Laterality Date  . Resection serous cystadenoma of pancreas  2004    at [facility]  . Abdominal hysterectomy       Current Outpatient Prescriptions  Medication Sig Dispense Refill  . aspirin 81 MG tablet Take 81 mg by mouth every evening.     . diltiazem (CARDIZEM CD) 240 MG 24  hr capsule Take 1 capsule (240 mg total) by mouth daily. 60 capsule 5  . ferrous sulfate 325 (65 FE) MG tablet TAKE 1 TABLET BY MOUTH TWICE DAILY WITH 500 MG VITAMIN C (Patient taking differently: TAKE 1 TABLET BY MOUTH TWICE DAILY) 60 tablet 11  . furosemide (LASIX) 80 MG tablet Take 1 tablet (80 mg total) by mouth 2 (two) times daily. 60 tablet 1  . hydrALAZINE (APRESOLINE) 25 MG tablet Take 1 tablet (25 mg total) by mouth 3 (three) times daily. 90 tablet 1  . Iron-Vitamins (GERITOL TONIC PO) Take 5 mLs by mouth once a week.     . isosorbide mononitrate (IMDUR) 60 MG 24 hr tablet Take 1 tablet (60 mg total) by mouth every morning. 30 tablet 5  . Multiple Vitamin (MULTIVITAMIN WITH MINERALS) TABS tablet Take 1 tablet by mouth every morning.    . nitroGLYCERIN (NITROSTAT) 0.4 MG SL tablet Place 1 tablet (0.4 mg total) under the tongue every 5 (five) minutes as needed for chest pain. 25 tablet 3  . simvastatin (ZOCOR) 40 MG tablet Take 1 tablet (40 mg total) by mouth daily at 6 PM. 30 tablet 1  . sitaGLIPtin (JANUVIA) 25 MG tablet Take 1 tablet (25 mg total) by mouth daily. 30 tablet 1  . Vitamin D, Ergocalciferol, (DRISDOL) 50000 UNITS CAPS capsule Take 50,000 Units by mouth every 7 (seven) days.   7  . apixaban (ELIQUIS) 2.5  MG TABS tablet Take 1 tablet (2.5 mg total) by mouth 2 (two) times daily. 60 tablet 11   No current facility-administered medications for this visit.    Allergies:   Piroxicam    Social History:  The patient  reports that she has never smoked. She has never used smokeless tobacco. She reports that she does not drink alcohol or use illicit drugs.   Family History:  The patient's family history includes Hypertension in her father and mother; Kidney failure in her father and mother.    ROS:  Please see the history of present illness.   Otherwise, review of systems are positive for not started therapy for sleep apnea, has some exertional fatigue, noted lower extremity  swelling increasing progressively until her hospitalization last week..   All other systems are reviewed and negative.    PHYSICAL EXAM: VS:  BP 132/92 mmHg  Pulse 105  Ht 5\' 5"  (1.651 m)  Wt 194 lb 6.4 oz (88.179 kg)  BMI 32.35 kg/m2  SpO2 98% , BMI Body mass index is 32.35 kg/(m^2). GEN: Well nourished, well developed, in no acute distress HEENT: normal Neck: no JVD, carotid bruits, or masses Cardiac: IRR; no murmurs, rubs, or gallops. edema  Respiratory:  clear to auscultation bilaterally, normal work of breathing GI: soft, nontender, nondistended, + BS MS: no deformity or atrophy Skin: warm and dry, no rash Neuro:  Strength and sensation are intact Psych: euthymic mood, full affect   EKG:  EKG is ordered today. The ekg ordered today demonstrates  Atrial flutter with atrial rate of 270 bpm , right bundle branch block, left axis deviation/inferior infarct.   Recent Labs: 06/21/2014: ALT 18; B Natriuretic Peptide 1150.9* 06/24/2014: BUN 54*; Creatinine 1.81*; Hemoglobin 11.5*; Platelets 345; Potassium 4.1; Sodium 136    Lipid Panel    Component Value Date/Time   CHOL 185 07/30/2012 1052   TRIG 119.0 07/30/2012 1052   HDL 59.70 07/30/2012 1052   CHOLHDL 3 07/30/2012 1052   VLDL 23.8 07/30/2012 1052   LDLCALC 102* 07/30/2012 1052   LDLDIRECT 142.5 08/11/2007 1021      Wt Readings from Last 3 Encounters:  06/28/14 194 lb 6.4 oz (88.179 kg)  06/24/14 195 lb 8 oz (88.678 kg)  05/07/14 212 lb (96.163 kg)      Other studies Reviewed: Additional studies/ records that were reviewed today include:  Complete review of the records from the recent four-day hospital stay.. Review of the above records demonstrates:  Identified that atrial tachycardia atrial flutter was present on admission but not identified by the treating team. Nuclear study was negative for ischemia. Wall motion study suggested an EF less than 40% but an echo revealed an EF of 55%. She had trace elevated  troponins while hospitalized. BNP was elevated. She was diuresed 15 pounds. Kidney function improved with diuresis going from a creatinine of 2. 1 down to 1.8  at discharge.   ASSESSMENT AND PLAN:  Essential hypertension:  Borderline control  OSA (obstructive sleep apnea) : Not yet treated   Atrial flutter, new and not identified during recent hospital stay  Right bundle branch block  Chronic diastolic CHF (congestive heart failure), NYHA class 3  Acute on chronic renal insufficiency     Current medicines are reviewed at length with the patient today.  The patient has concerns regarding medicines.  The following changes have been made:   1. Start Eliquis 2.5 mg daily , 2. Increase diltiazem to 240 mg per day , 3.  24-hour Holter monitor ,  4. Weight today and daily under the same circumstances at home notifying us for increase in weight greater than 3-5 pounds , 5. Consider electrical cardioversion after one month anticoagulation therapy ; 6. Long discussion concerning atrial flutter and the risk of tachycardia mediated cardiomyopathy and stroke (chads vasc is greater than 2 ). Holter will help [place] decide if rate can be controlled.  Labs/ tests ordered today include:   Orders Placed This Encounter  Procedures  . Basic metabolic panel  . CBC w/Diff  . B Nat Peptide  . Holter monitor - 24 hour  . EKG 12-Lead     Disposition:   FU with HS in 2 weeks  Signed, Sinclair Grooms, MD  06/28/2014 10:15 AM    [address] [place], [place],   [address] Phone: [phone]; Fax: [phone]

## 2014-06-28 NOTE — Telephone Encounter (Signed)
Noted. Per Dr.Smith we will use this weight 193.7lb as pt baseline. FYI routed to Dr.Smith

## 2014-06-29 NOTE — Telephone Encounter (Signed)
Returned pt son call. Pt weight today noted. Adv him to continue to have pt weigh daily under the same circumstances. He should provide pt weights at the end of the wk. He is to call the office if pt gains more that 3lbs a day or 5lbs in a wk. Her verbalized understanding.

## 2014-06-29 NOTE — Telephone Encounter (Signed)
F/u    Pt's wt today is 194lbs. Please advise

## 2014-06-30 DIAGNOSIS — G4733 Obstructive sleep apnea (adult) (pediatric): Secondary | ICD-10-CM | POA: Diagnosis not present

## 2014-07-01 ENCOUNTER — Telehealth: Payer: Self-pay

## 2014-07-01 NOTE — Telephone Encounter (Signed)
-----   Message from Belva Crome, MD sent at 06/29/2014  5:33 PM EDT ----- Stable but abnormal kidney function. Hemoglobin is okay.

## 2014-07-01 NOTE — Telephone Encounter (Signed)
Pt son aware of lab results. Stable but abnormal kidney function. Hemoglobin is okay. Pt son [name] verbalized understanding.

## 2014-07-05 ENCOUNTER — Telehealth: Payer: Self-pay

## 2014-07-05 ENCOUNTER — Ambulatory Visit (INDEPENDENT_AMBULATORY_CARE_PROVIDER_SITE_OTHER): Payer: Medicare Other

## 2014-07-05 ENCOUNTER — Encounter: Payer: Self-pay | Admitting: *Deleted

## 2014-07-05 DIAGNOSIS — I4892 Unspecified atrial flutter: Secondary | ICD-10-CM | POA: Diagnosis not present

## 2014-07-05 NOTE — Telephone Encounter (Signed)
Prior auth submitted for Eliquis 2.5mg  bid to [facility] via Cover My Meds.

## 2014-07-05 NOTE — Progress Notes (Signed)
Patient ID: Wendy Kerr, female   DOB: [birth year], 81 y.o.   MRN: [id number] [facility] 24 hour holter monitor applied to patient.

## 2014-07-06 ENCOUNTER — Telehealth: Payer: Self-pay

## 2014-07-06 NOTE — Telephone Encounter (Signed)
Received fax from [place] approving patient's Eliquis 2.5mg  thru 07/05/2015. [id number].

## 2014-07-09 ENCOUNTER — Encounter: Payer: Self-pay | Admitting: Cardiology

## 2014-07-12 ENCOUNTER — Telehealth: Payer: Self-pay | Admitting: Internal Medicine

## 2014-07-12 NOTE — Telephone Encounter (Signed)
Pharmacy calling to advise that losartan (COZAAR) 100 MG tablet [[id number]] DISCONTINUED  Has been a-b rated by the FDA. Should be switched to sartan or dispense generic.

## 2014-07-12 NOTE — Telephone Encounter (Signed)
This is not my patient but listed as Dr. Bradd Burner and will forward to him.

## 2014-07-13 ENCOUNTER — Telehealth: Payer: Self-pay

## 2014-07-13 MED ORDER — DIGOXIN 125 MCG PO TABS: 0.0625 mg | ORAL_TABLET | Freq: Every day | ORAL | Status: DC

## 2014-07-13 NOTE — Telephone Encounter (Signed)
-----   Message from Belva Crome, MD sent at 07/12/2014  6:17 PM EDT ----- Patient needs add digoxin 0.0625 mg daily to better control her ventricular rate

## 2014-07-13 NOTE — Telephone Encounter (Signed)
Pt and pt son aware of holter monitor results and Dr.Smith's recomnmendations.  Continuous atrial fibrillation  Average heart rate 85 bpm.  Fastest heart rate 115 bpm.  Needs better rate control.  Add digoxin .0625 mg daily.to better control her ventricular rate  Rx sent to pt pharmacy. Pt reports that she is doing well. Her weights have been stable between 184lb-185lb. Pt will keep upcoming appt with Dr.Smith on 5/24. Pt and pt son verbalized understanding.

## 2014-07-20 ENCOUNTER — Ambulatory Visit (INDEPENDENT_AMBULATORY_CARE_PROVIDER_SITE_OTHER): Payer: Medicare Other | Admitting: Interventional Cardiology

## 2014-07-20 ENCOUNTER — Encounter: Payer: Self-pay | Admitting: Interventional Cardiology

## 2014-07-20 VITALS — BP 154/90 | HR 73 | Ht 65.0 in | Wt 185.0 lb

## 2014-07-20 DIAGNOSIS — I4510 Unspecified right bundle-branch block: Secondary | ICD-10-CM | POA: Diagnosis not present

## 2014-07-20 DIAGNOSIS — I10 Essential (primary) hypertension: Secondary | ICD-10-CM

## 2014-07-20 DIAGNOSIS — I484 Atypical atrial flutter: Secondary | ICD-10-CM | POA: Diagnosis not present

## 2014-07-20 DIAGNOSIS — I5032 Chronic diastolic (congestive) heart failure: Secondary | ICD-10-CM | POA: Diagnosis not present

## 2014-07-20 DIAGNOSIS — E7800 Pure hypercholesterolemia, unspecified: Secondary | ICD-10-CM

## 2014-07-20 DIAGNOSIS — E1122 Type 2 diabetes mellitus with diabetic chronic kidney disease: Secondary | ICD-10-CM

## 2014-07-20 DIAGNOSIS — N183 Chronic kidney disease, stage 3 unspecified: Secondary | ICD-10-CM

## 2014-07-20 DIAGNOSIS — G4733 Obstructive sleep apnea (adult) (pediatric): Secondary | ICD-10-CM | POA: Diagnosis not present

## 2014-07-20 NOTE — Progress Notes (Signed)
Cardiology Office Note   Date:  07/20/2014   ID:  Earleen, Aoun August 05, 1930, MRN [id number]  PCP:  Sherian Maroon, MD  Cardiologist:  Sinclair Grooms, MD   Chief Complaint  Patient presents with  . CHRONIC DIASTOLIC CHF      History of Present Illness: Wendy Kerr is a 81 y.o. female who presents for persistent atrial flutter, chronic combined systolic and diastolic heart failure, hypertension, and chronic kidney disease.  She is doing much better. Sleep apnea is been identified and she is now on BiPAP. She feels much more rested. She is ambulating without difficulty. She is not been lightheaded or dizzy. She has slept well. She denies chest discomfort.    Past Medical History  Diagnosis Date  . Bronchitis, mucopurulent recurrent   . Hypertension   . Diabetes mellitus   . Hypercholesterolemia   . Obesity   . Diverticulosis of colon   . Benign neoplasm of pancreas, except islets of Langerhans   . DJD (degenerative joint disease)   . Vitamin D deficiency   . Common migraine   . Generalized anxiety disorder   . Anemia   . Monoclonal gammopathy   . OSA (obstructive sleep apnea)     severe with AHI 37 events per hour    Past Surgical History  Procedure Laterality Date  . Resection serous cystadenoma of pancreas  2004    at [facility]  . Abdominal hysterectomy       Current Outpatient Prescriptions  Medication Sig Dispense Refill  . apixaban (ELIQUIS) 2.5 MG TABS tablet Take 1 tablet (2.5 mg total) by mouth 2 (two) times daily. 60 tablet 11  . aspirin 81 MG tablet Take 81 mg by mouth every evening.     . digoxin (LANOXIN) 0.125 MG tablet Take 0.5 tablets (0.0625 mg total) by mouth daily. 15 tablet 5  . diltiazem (CARDIZEM CD) 240 MG 24 hr capsule Take 1 capsule (240 mg total) by mouth daily. 60 capsule 5  . ferrous sulfate 325 (65 FE) MG tablet TAKE 1 TABLET BY MOUTH TWICE DAILY WITH 500 MG VITAMIN C (Patient taking differently: TAKE 1 TABLET BY  MOUTH TWICE DAILY) 60 tablet 11  . furosemide (LASIX) 80 MG tablet Take 1 tablet (80 mg total) by mouth 2 (two) times daily. 60 tablet 1  . hydrALAZINE (APRESOLINE) 25 MG tablet Take 1 tablet (25 mg total) by mouth 3 (three) times daily. 90 tablet 1  . Iron-Vitamins (GERITOL TONIC PO) Take 5 mLs by mouth once a week.     . isosorbide mononitrate (IMDUR) 60 MG 24 hr tablet Take 1 tablet (60 mg total) by mouth every morning. 30 tablet 5  . metFORMIN (GLUCOPHAGE) 500 MG tablet Take 500 mg by mouth 2 (two) times daily.    . Multiple Vitamin (MULTIVITAMIN WITH MINERALS) TABS tablet Take 1 tablet by mouth every morning.    . nitroGLYCERIN (NITROSTAT) 0.4 MG SL tablet Place 1 tablet (0.4 mg total) under the tongue every 5 (five) minutes as needed for chest pain. 25 tablet 3  . simvastatin (ZOCOR) 40 MG tablet Take 1 tablet (40 mg total) by mouth daily at 6 PM. 30 tablet 1  . sitaGLIPtin (JANUVIA) 25 MG tablet Take 1 tablet (25 mg total) by mouth daily. 30 tablet 1  . Vitamin D, Ergocalciferol, (DRISDOL) 50000 UNITS CAPS capsule Take 50,000 Units by mouth every 7 (seven) days.   7   No current facility-administered medications for this  visit.    Allergies:   Piroxicam    Social History:  The patient  reports that she has never smoked. She has never used smokeless tobacco. She reports that she does not drink alcohol or use illicit drugs.   Family History:  The patient's family history includes Hypertension in her father and mother; Kidney failure in her father and mother.    ROS:  Please see the history of present illness.   Otherwise, review of systems are positive for some weakness. But lower extremity edema has completely resolved..   All other systems are reviewed and negative.    PHYSICAL EXAM: VS:  BP 154/90 mmHg  Pulse 73  Ht 5\' 5"  (1.651 m)  Wt 185 lb (83.915 kg)  BMI 30.79 kg/m2 , BMI Body mass index is 30.79 kg/(m^2). GEN: Well nourished, well developed, in no acute distress HEENT:  normal Neck: no JVD, carotid bruits, or masses Cardiac: IRR; no murmurs, rubs, or gallops,no edema  Respiratory:  clear to auscultation bilaterally, normal work of breathing GI: soft, nontender, nondistended, + BS MS: no deformity or atrophy Skin: warm and dry, no rash Neuro:  Strength and sensation are intact Psych: euthymic mood, full affect   EKG:  EKG is ordered today. The ekg ordered today demonstrates atrial flutter with controlled rate 73 bpm, right bundle branch block, poor R-wave progression V3 through V4 and evidence of inferior infarction. Left axis deviation is noted.   Recent Labs: 06/21/2014: ALT 18; B Natriuretic Peptide 1150.9* 06/28/2014: BUN 59*; Creatinine 1.92*; Hemoglobin 12.1; Platelets 441.0*; Potassium 4.2; Pro B Natriuretic peptide (BNP) 1332.0*; Sodium 132*    Lipid Panel    Component Value Date/Time   CHOL 185 07/30/2012 1052   TRIG 119.0 07/30/2012 1052   HDL 59.70 07/30/2012 1052   CHOLHDL 3 07/30/2012 1052   VLDL 23.8 07/30/2012 1052   LDLCALC 102* 07/30/2012 1052   LDLDIRECT 142.5 08/11/2007 1021      Wt Readings from Last 3 Encounters:  07/20/14 185 lb (83.915 kg)  06/28/14 194 lb 6.4 oz (88.179 kg)  06/24/14 195 lb 8 oz (88.678 kg)      Other studies Reviewed: Additional studies/ records that were reviewed today include: . Review of the above records demonstrates:    ASSESSMENT AND PLAN:  Chronic diastolic heart failure - as a volume overload. Weight now down to 185 pounds  Right bundle branch block - unchanged  OSA (obstructive sleep apnea) - now on BiPAP  Essential hypertension - mildly elevated but has not had all of her medication yet today.  CKD stage 3 due to type 2 diabetes mellitus  Atypical atrial flutter - controlled rate. She will now become more active. We will discuss whether to perform electrical cardioversion based upon her energy level as she moved back into a normal routine.     Current medicines are reviewed  at length with the patient today.  The patient does not have concerns regarding medicines.  The following changes have been made:  no change  Labs/ tests ordered today include:  No orders of the defined types were placed in this encounter.     Disposition:   FU with HS in 4 weeks  Signed, Sinclair Grooms, MD  07/20/2014 10:04 AM    [address], [place], [place]  [address] Phone: [phone]; Fax: [phone]

## 2014-07-20 NOTE — Patient Instructions (Signed)
Medication Instructions: Your physician recommends that you continue on your current medications as directed. Please refer to the Current Medication list given to you today.   Labwork: None   Testing/Procedures: None   Follow-Up: You have a follow up appointment with Dr.Smith on 08/23/14 @9am   Any Other Special Instructions Will Be Listed Below (If Applicable). Ok to increase activity

## 2014-07-31 DIAGNOSIS — G4733 Obstructive sleep apnea (adult) (pediatric): Secondary | ICD-10-CM | POA: Diagnosis not present

## 2014-08-02 ENCOUNTER — Telehealth: Payer: Self-pay | Admitting: Internal Medicine

## 2014-08-02 NOTE — Telephone Encounter (Signed)
To clarify, she is asking for an alternative medication to [place]

## 2014-08-02 NOTE — Telephone Encounter (Signed)
Would you like to send in a replacement

## 2014-08-02 NOTE — Telephone Encounter (Signed)
Patient wanted to advise that she has not been able to take sitaGLIPtin (JANUVIA) 25 MG tablet [[id number]] because she has not been able to afford it. She states that she is still taking metFORMIN (GLUCOPHAGE) 500 MG tablet [[id number]] .

## 2014-08-03 NOTE — Telephone Encounter (Signed)
Will need visit with labs. May be okay off [place]. Continue with metformin for now.

## 2014-08-03 NOTE — Telephone Encounter (Signed)
Tried to call pt. No vm to leave message and no answer from pt.  s

## 2014-08-04 NOTE — Telephone Encounter (Signed)
Spoke with patient's son and patient will take metformin only and an office visit has already been scheduled.

## 2014-08-05 ENCOUNTER — Other Ambulatory Visit: Payer: Self-pay | Admitting: Internal Medicine

## 2014-08-05 ENCOUNTER — Encounter: Payer: Self-pay | Admitting: Cardiology

## 2014-08-06 ENCOUNTER — Other Ambulatory Visit: Payer: Self-pay | Admitting: *Deleted

## 2014-08-06 ENCOUNTER — Ambulatory Visit: Payer: Medicare Other | Admitting: Cardiology

## 2014-08-06 DIAGNOSIS — G4733 Obstructive sleep apnea (adult) (pediatric): Secondary | ICD-10-CM

## 2014-08-08 ENCOUNTER — Other Ambulatory Visit: Payer: Self-pay | Admitting: Interventional Cardiology

## 2014-08-13 ENCOUNTER — Ambulatory Visit (INDEPENDENT_AMBULATORY_CARE_PROVIDER_SITE_OTHER): Payer: Medicare Other | Admitting: Internal Medicine

## 2014-08-13 ENCOUNTER — Other Ambulatory Visit (INDEPENDENT_AMBULATORY_CARE_PROVIDER_SITE_OTHER): Payer: Medicare Other

## 2014-08-13 ENCOUNTER — Encounter: Payer: Self-pay | Admitting: Internal Medicine

## 2014-08-13 VITALS — BP 140/78 | HR 79 | Temp 97.8°F | Resp 14 | Ht 65.0 in | Wt 188.0 lb

## 2014-08-13 DIAGNOSIS — I10 Essential (primary) hypertension: Secondary | ICD-10-CM

## 2014-08-13 DIAGNOSIS — E1122 Type 2 diabetes mellitus with diabetic chronic kidney disease: Secondary | ICD-10-CM

## 2014-08-13 DIAGNOSIS — I5032 Chronic diastolic (congestive) heart failure: Secondary | ICD-10-CM

## 2014-08-13 DIAGNOSIS — E1169 Type 2 diabetes mellitus with other specified complication: Secondary | ICD-10-CM

## 2014-08-13 DIAGNOSIS — N183 Chronic kidney disease, stage 3 unspecified: Secondary | ICD-10-CM

## 2014-08-13 DIAGNOSIS — E119 Type 2 diabetes mellitus without complications: Secondary | ICD-10-CM

## 2014-08-13 DIAGNOSIS — E669 Obesity, unspecified: Secondary | ICD-10-CM

## 2014-08-13 LAB — COMPREHENSIVE METABOLIC PANEL
ALBUMIN: 4.3 g/dL (ref 3.5–5.2)
ALK PHOS: 83 U/L (ref 39–117)
ALT: 16 U/L (ref 0–35)
AST: 17 U/L (ref 0–37)
BILIRUBIN TOTAL: 0.4 mg/dL (ref 0.2–1.2)
BUN: 64 mg/dL — ABNORMAL HIGH (ref 6–23)
CALCIUM: 9.6 mg/dL (ref 8.4–10.5)
CO2: 23 mEq/L (ref 19–32)
CREATININE: 2.32 mg/dL — AB (ref 0.40–1.20)
Chloride: 99 mEq/L (ref 96–112)
GFR: 25.84 mL/min — ABNORMAL LOW (ref >60.00)
Glucose, Bld: 120 mg/dL — ABNORMAL HIGH (ref 70–99)
Potassium: 4.3 mEq/L (ref 3.5–5.1)
SODIUM: 132 meq/L — AB (ref 135–145)
Total Protein: 7.7 g/dL (ref 6.0–8.3)

## 2014-08-13 LAB — HEMOGLOBIN A1C: HEMOGLOBIN A1C: 6.4 % (ref 4.6–6.5)

## 2014-08-13 MED ORDER — NITROGLYCERIN 0.4 MG SL SUBL: 0.4000 mg | SUBLINGUAL_TABLET | SUBLINGUAL | Status: AC | PRN

## 2014-08-13 NOTE — Assessment & Plan Note (Signed)
BP stable today, up 2 pounds in the last couple days. Talked to her about the need to call back if 5 pounds in one week or 3 pounds in one day. Is faithful with her medications and working on her fluid restriction. Not in exacerbation today and following with cardiology later this month.

## 2014-08-13 NOTE — Progress Notes (Signed)
   Subjective:    Patient ID: Wendy Kerr, female    DOB: 30-Jan-1931, 81 y.o.   MRN: [id number]  HPI The patient is an 81 YO female coming in for follow up of her diabetes. She has not been able to afford [place] and this is bothering her. She is still taking her metformin. She does exercise a little but ends up running around her 3 sisters since none of them drive. She denies numbness or tingling in her feet. She does have complication of kidney disease which has worsened in the last 6 months. Her blood pressure regimen was adjusted when she was in the hospital in April and since then with her cardiologist. Medication reconciliation performed today since we have not seen her back since hospital stay.   Review of Systems  Constitutional: Negative for fever, activity change, appetite change, fatigue and unexpected weight change.  HENT: Negative.   Respiratory: Negative for cough, chest tightness, shortness of breath and wheezing.   Cardiovascular: Negative for chest pain and palpitations.  Gastrointestinal: Negative for abdominal pain, constipation and abdominal distention.  Genitourinary: Negative.   Musculoskeletal: Negative.   Skin: Negative.   Neurological: Negative.   Psychiatric/Behavioral: Negative.       Objective:   Physical Exam  Constitutional: She is oriented to person, place, and time. She appears well-developed and well-nourished.  overweight  HENT:  Head: Normocephalic and atraumatic.  Eyes: EOM are normal.  Neck: Normal range of motion.  Cardiovascular: Normal rate and regular rhythm.   Pulmonary/Chest: Effort normal and breath sounds normal. No respiratory distress. She has no wheezes. She has no rales.  Abdominal: Soft. Bowel sounds are normal. She exhibits no distension. There is no tenderness. There is no rebound.  Neurological: She is alert and oriented to person, place, and time.  Skin: Skin is warm and dry.   Filed Vitals:   08/13/14 1022  BP: 140/78    Pulse: 79  Temp: 97.8 F (36.6 C)  TempSrc: Oral  Resp: 14  Height: 5\' 5"  (1.651 m)  Weight: 188 lb (85.276 kg)  SpO2: 96%      Assessment & Plan:

## 2014-08-13 NOTE — Assessment & Plan Note (Signed)
Has been off [place] for several months. Checking HgA1c today and if <7.5 okay to stay off the [place]. Continue metformin.

## 2014-08-13 NOTE — Assessment & Plan Note (Signed)
Last Creatinine elevated from her prior baseline. Unclear if this was because of diuresis. Checking today. BP and sugars controlled. Not on ACe-I or ARB at this time.

## 2014-08-13 NOTE — Assessment & Plan Note (Signed)
BP controlled on diltiazem, hydralazine, lasix, imdur. Checking BMP today.

## 2014-08-13 NOTE — Progress Notes (Signed)
Pre visit review using our clinic review tool, if applicable. No additional management support is needed unless otherwise documented below in the visit note. 

## 2014-08-13 NOTE — Patient Instructions (Signed)
We are going to check your labs today. Unless you hear differently from [place] you can stay off the [place]. Keep taking the metformin.   Come back in about 3-6 months so we can check on you. If you keep going up in weight more than 3 pounds in one day or 5 pounds in a week call our office for advice. This helps [place] to keep up with the breathing before you start having problems that could land you in the hospital.   Diabetes and Exercise Exercising regularly is important. It is not just about losing weight. It has many health benefits, such as:  Improving your overall fitness, flexibility, and endurance.  Increasing your bone density.  Helping with weight control.  Decreasing your body fat.  Increasing your muscle strength.  Reducing stress and tension.  Improving your overall health. People with diabetes who exercise gain additional benefits because exercise:  Reduces appetite.  Improves the body's use of blood sugar (glucose).  Helps lower or control blood glucose.  Decreases blood pressure.  Helps control blood lipids (such as cholesterol and triglycerides).  Improves the body's use of the hormone insulin by:  Increasing the body's insulin sensitivity.  Reducing the body's insulin needs.  Decreases the risk for heart disease because exercising:  Lowers cholesterol and triglycerides levels.  Increases the levels of good cholesterol (such as high-density lipoproteins [HDL]) in the body.  Lowers blood glucose levels. YOUR ACTIVITY PLAN  Choose an activity that you enjoy and set realistic goals. Your health care provider or diabetes educator can help you make an activity plan that works for you. Exercise regularly as directed by your health care provider. This includes:  Performing resistance training twice a week such as push-ups, sit-ups, lifting weights, or using resistance bands.  Performing 150 minutes of cardio exercises each week such as walking, running, or playing  sports.  Staying active and spending no more than 90 minutes at one time being inactive. Even short bursts of exercise are good for you. Three 10-minute sessions spread throughout the day are just as beneficial as a single 30-minute session. Some exercise ideas include:  Taking the dog for a walk.  Taking the stairs instead of the elevator.  Dancing to your favorite song.  Doing an exercise video.  Doing your favorite exercise with a friend. RECOMMENDATIONS FOR EXERCISING WITH TYPE 1 OR TYPE 2 DIABETES   Check your blood glucose before exercising. If blood glucose levels are greater than 240 mg/dL, check for urine ketones. Do not exercise if ketones are present.  Avoid injecting insulin into areas of the body that are going to be exercised. For example, avoid injecting insulin into:  The arms when playing tennis.  The legs when jogging.  Keep a record of:  Food intake before and after you exercise.  Expected peak times of insulin action.  Blood glucose levels before and after you exercise.  The type and amount of exercise you have done.  Review your records with your health care provider. Your health care provider will help you to develop guidelines for adjusting food intake and insulin amounts before and after exercising.  If you take insulin or oral hypoglycemic agents, watch for signs and symptoms of hypoglycemia. They include:  Dizziness.  Shaking.  Sweating.  Chills.  Confusion.  Drink plenty of water while you exercise to prevent dehydration or heat stroke. Body water is lost during exercise and must be replaced.  Talk to your health care provider before starting  an exercise program to make sure it is safe for you. Remember, almost any type of activity is better than none. Document Released: 05/05/2003 Document Revised: 06/29/2013 Document Reviewed: 07/22/2012 [facility] Patient Information 2015 [place], [place]. This information is not intended to replace  advice given to you by your health care provider. Make sure you discuss any questions you have with your health care provider.

## 2014-08-18 ENCOUNTER — Telehealth: Payer: Self-pay | Admitting: Interventional Cardiology

## 2014-08-18 NOTE — Telephone Encounter (Signed)
Pt son aware samples of Eliquis will be left at the front desk for pick up.  he will call back with preferred anti-coag med info later today

## 2014-08-18 NOTE — Telephone Encounter (Signed)
Returned pt son call. Adv pt son that Eliquis does not come in generic form. The only generic anti-coag on the market is coumadin Pt son sts that the pt has been unable to afford Eliquis, it is costing pt $200 a month. Pt has been out of Eliquis for 5 days. Adv pt son to call the insurance company to find out what is there preferred anti-coag medication., and to see if another med might be more cost effective. I will attempt to locate Eliquis samples, and call him back.

## 2014-08-18 NOTE — Telephone Encounter (Signed)
New problem   Pt's son calling and need to speak to nurse concerning the Eliquius that Dr Tamala Julian put pt on. He want to know if there is a gerneric for it b/c it's to expensive.

## 2014-08-22 NOTE — Progress Notes (Signed)
Cardiology Office Note   Date:  08/23/2014   ID:  Wendy Kerr May 17, 1930, MRN [id number]  PCP:  Olga Millers, MD  Cardiologist:  Sinclair Grooms, MD   Chief Complaint  Patient presents with  . Congestive Heart Failure      History of Present Illness: Wendy Kerr is a 81 y.o. female who presents for  Chronic diastolic heart failure, hypertension, atypical atrial flutter, chronic kidney disease , and known right bundle branch block. Suspected to have underlying coronary disease.  She is doing well. She just returned from a family reunion. She denies orthopnea, PND, and chest pain. She does have some lower extremity swelling. She is briefly offered diet doing the family reunion. She denies difficulty with ambulation.    Past Medical History  Diagnosis Date  . Bronchitis, mucopurulent recurrent   . Hypertension   . Diabetes mellitus   . Hypercholesterolemia   . Obesity   . Diverticulosis of colon   . Benign neoplasm of pancreas, except islets of Langerhans   . DJD (degenerative joint disease)   . Vitamin D deficiency   . Common migraine   . Generalized anxiety disorder   . Anemia   . Monoclonal gammopathy   . OSA (obstructive sleep apnea)     severe with AHI 37 events per hour    Past Surgical History  Procedure Laterality Date  . Resection serous cystadenoma of pancreas  2004    at [facility]  . Abdominal hysterectomy       Current Outpatient Prescriptions  Medication Sig Dispense Refill  . apixaban (ELIQUIS) 2.5 MG TABS tablet Take 1 tablet (2.5 mg total) by mouth 2 (two) times daily. 60 tablet 11  . digoxin (LANOXIN) 0.125 MG tablet Take 0.5 tablets (0.0625 mg total) by mouth daily. 15 tablet 5  . diltiazem (CARDIZEM CD) 240 MG 24 hr capsule Take 1 capsule (240 mg total) by mouth daily. 60 capsule 5  . ferrous sulfate 325 (65 FE) MG tablet TAKE 1 TABLET BY MOUTH TWICE DAILY WITH 500 MG VITAMIN C (Patient taking differently: TAKE 1 TABLET BY  MOUTH TWICE DAILY) 60 tablet 11  . furosemide (LASIX) 80 MG tablet Take 1 tablet (80 mg total) by mouth 2 (two) times daily. 60 tablet 1  . hydrALAZINE (APRESOLINE) 25 MG tablet Take 1 tablet (25 mg total) by mouth 3 (three) times daily. 90 tablet 1  . Iron-Vitamins (GERITOL TONIC PO) Take 5 mLs by mouth once a week.     . isosorbide mononitrate (IMDUR) 60 MG 24 hr tablet TAKE 1 TABLET (60 MG TOTAL) BY MOUTH EVERY MORNING. 30 tablet 5  . metFORMIN (GLUCOPHAGE) 500 MG tablet Take 500 mg by mouth 2 (two) times daily.    . Multiple Vitamin (MULTIVITAMIN WITH MINERALS) TABS tablet Take 1 tablet by mouth every morning.    . nitroGLYCERIN (NITROSTAT) 0.4 MG SL tablet Place 1 tablet (0.4 mg total) under the tongue every 5 (five) minutes as needed for chest pain. 5 tablet 3  . simvastatin (ZOCOR) 40 MG tablet TAKE 1 TABLET (40 MG TOTAL) BY MOUTH AT BEDTIME. 30 tablet 3  . Vitamin D, Ergocalciferol, (DRISDOL) 50000 UNITS CAPS capsule Take 50,000 Units by mouth every 7 (seven) days.   7   No current facility-administered medications for this visit.    Allergies:   Piroxicam    Social History:  The patient  reports that she has never smoked. She has never used smokeless  tobacco. She reports that she does not drink alcohol or use illicit drugs.   Family History:  The patient's family history includes Hypertension in her father and mother; Kidney failure in her father and mother.    ROS:  Please see the history of present illness.   Otherwise, review of systems are positive for  Stable appetite, concerned about her kidney function, an ankle edema..   All other systems are reviewed and negative.    PHYSICAL EXAM: VS:  BP 142/86 mmHg  Pulse 74  Ht 5\' 5"  (1.651 m)  Wt 85.276 kg (188 lb)  BMI 31.28 kg/m2 , BMI Body mass index is 31.28 kg/(m^2). GEN: Well nourished, well developed, in no acute distress HEENT: normal Neck: no JVD, carotid bruits, or masses Cardiac: RRR; no murmurs, rubs, or gallops,no  edema  Respiratory:  clear to auscultation bilaterally, normal work of breathing GI: soft, nontender, nondistended, + BS MS: no deformity or atrophy Skin: warm and dry, no rash Neuro:  Strength and sensation are intact Psych: euthymic mood, full affect   EKG:  EKG is not ordered today   Recent Labs: 06/21/2014: B Natriuretic Peptide 1150.9* 06/28/2014: Hemoglobin 12.1; Platelets 441.0*; Pro B Natriuretic peptide (BNP) 1332.0* 08/13/2014: ALT 16; BUN 64*; Creatinine, Ser 2.32*; Potassium 4.3; Sodium 132*    Lipid Panel    Component Value Date/Time   CHOL 185 07/30/2012 1052   TRIG 119.0 07/30/2012 1052   HDL 59.70 07/30/2012 1052   CHOLHDL 3 07/30/2012 1052   VLDL 23.8 07/30/2012 1052   LDLCALC 102* 07/30/2012 1052   LDLDIRECT 142.5 08/11/2007 1021      Wt Readings from Last 3 Encounters:  08/23/14 85.276 kg (188 lb)  08/13/14 85.276 kg (188 lb)  07/20/14 83.915 kg (185 lb)      Other studies Reviewed: Additional studies/ records that were reviewed today include: .    ASSESSMENT AND PLAN:  1. Chronic diastolic heart failure  evidence of mild volume overload. I encouraged her to decrease salt in the diet and to continue to weigh daily. She is to call if the weight goes up.  2. Essential hypertension  Controlled  3. Atypical atrial flutter  Controlled rate  4. CKD stage 3 due to type 2 diabetes mellitus  Creatinine 1.9 to one month ago potassium was 4.2.  5. Right bundle branch block  not reassessed    Current medicines are reviewed at length with the patient today.  The patient does not have concerns regarding medicines.  The following changes have been made:   We may have to switch to Coumadin because she is having a difficult time affording eloquent's.  Labs/ tests ordered today include:  No orders of the defined types were placed in this encounter.     Disposition:   FU with HS in 1 year  Signed, Sinclair Grooms, MD  08/23/2014 10:02 AM      [address], [place], [place]  [address] Phone: [phone]; Fax: [phone]

## 2014-08-23 ENCOUNTER — Ambulatory Visit (INDEPENDENT_AMBULATORY_CARE_PROVIDER_SITE_OTHER): Payer: Medicare Other | Admitting: Interventional Cardiology

## 2014-08-23 ENCOUNTER — Ambulatory Visit (INDEPENDENT_AMBULATORY_CARE_PROVIDER_SITE_OTHER): Payer: Medicare Other | Admitting: Cardiology

## 2014-08-23 ENCOUNTER — Encounter: Payer: Self-pay | Admitting: Cardiology

## 2014-08-23 ENCOUNTER — Encounter: Payer: Self-pay | Admitting: Interventional Cardiology

## 2014-08-23 VITALS — BP 142/86 | HR 74 | Ht 65.0 in | Wt 188.0 lb

## 2014-08-23 VITALS — BP 124/66 | HR 60 | Ht 65.0 in | Wt 188.4 lb

## 2014-08-23 DIAGNOSIS — E1122 Type 2 diabetes mellitus with diabetic chronic kidney disease: Secondary | ICD-10-CM | POA: Diagnosis not present

## 2014-08-23 DIAGNOSIS — I484 Atypical atrial flutter: Secondary | ICD-10-CM

## 2014-08-23 DIAGNOSIS — I10 Essential (primary) hypertension: Secondary | ICD-10-CM

## 2014-08-23 DIAGNOSIS — E669 Obesity, unspecified: Secondary | ICD-10-CM

## 2014-08-23 DIAGNOSIS — G4733 Obstructive sleep apnea (adult) (pediatric): Secondary | ICD-10-CM | POA: Diagnosis not present

## 2014-08-23 DIAGNOSIS — I5032 Chronic diastolic (congestive) heart failure: Secondary | ICD-10-CM | POA: Diagnosis not present

## 2014-08-23 DIAGNOSIS — N183 Chronic kidney disease, stage 3 unspecified: Secondary | ICD-10-CM

## 2014-08-23 DIAGNOSIS — I4510 Unspecified right bundle-branch block: Secondary | ICD-10-CM

## 2014-08-23 NOTE — Patient Instructions (Signed)
Medication Instructions:  Your physician recommends that you continue on your current medications as directed. Please refer to the Current Medication list given to you today.   Labwork: None  Testing/Procedures: None  Follow-Up: Your physician wants you to follow-up in: 6 months with Dr. Radford Pax. You will receive a reminder letter in the mail two months in advance. If you don't receive a letter, please call our office to schedule the follow-up appointment.   Any Other Special Instructions Will Be Listed Below (If Applicable). [facility] will be in touch soon to get your chin strap for your mask.

## 2014-08-23 NOTE — Patient Instructions (Signed)
Medication Instructions:  Your physician recommends that you continue on your current medications as directed. Please refer to the Current Medication list given to you today.  Labwork: None   Testing/Procedures: None   Follow-Up: Your physician recommends that you schedule a follow-up appointment in: 3 months with Dr.Smith   Any Other Special Instructions Will Be Listed Below (If Applicable). Your physician recommends that you weigh, daily, at the same time every day, and in the same amount of clothing. Please record your daily weights.

## 2014-08-23 NOTE — Progress Notes (Signed)
Cardiology Office Note   Date:  08/23/2014   ID:  Wendy Kerr, Wendy Kerr [birth year], MRN [id number]  PCP:  Olga Millers, MD    Chief Complaint  Patient presents with  . Follow-up    OSA      History of Present Illness: Wendy Kerr is a 81 y.o. female who presents for evaluation of sleep apnea.  She complained to Dr. Tamala Julian that she was having excessive daytime sleepiness with headaches in the am and underwent PSG showing severe OSA with an AHI of 37 events per hour with oxygen desaturations to 83%.  She underwent CPAP titration but could not be adequately titrated and was set up for BiPAP titration at home. Since going on the BIPAP she does not feel as tired during the day.  She has not yet been set up on the set pressure of 17/13cm H2O and [facility] is going to do it this week.  She uses a nasal pillow mask with no chin strap. Her daughter says that she stills snore and is breathing through her mouth.  She does complain of some mouth dryness but no nasal congestion.      Past Medical History  Diagnosis Date  . Bronchitis, mucopurulent recurrent   . Hypertension   . Diabetes mellitus   . Hypercholesterolemia   . Obesity   . Diverticulosis of colon   . Benign neoplasm of pancreas, except islets of Langerhans   . DJD (degenerative joint disease)   . Vitamin D deficiency   . Common migraine   . Generalized anxiety disorder   . Anemia   . Monoclonal gammopathy   . OSA (obstructive sleep apnea)     severe with AHI 37 events per hour    Past Surgical History  Procedure Laterality Date  . Resection serous cystadenoma of pancreas  2004    at [facility]  . Abdominal hysterectomy       Current Outpatient Prescriptions  Medication Sig Dispense Refill  . apixaban (ELIQUIS) 2.5 MG TABS tablet Take 1 tablet (2.5 mg total) by mouth 2 (two) times daily. 60 tablet 11  . digoxin (LANOXIN) 0.125 MG tablet Take 0.5 tablets (0.0625 mg total) by mouth daily. 15 tablet 5   . diltiazem (CARDIZEM CD) 240 MG 24 hr capsule Take 1 capsule (240 mg total) by mouth daily. 60 capsule 5  . ferrous sulfate 325 (65 FE) MG tablet TAKE 1 TABLET BY MOUTH TWICE DAILY WITH 500 MG VITAMIN C (Patient taking differently: TAKE 1 TABLET BY MOUTH TWICE DAILY) 60 tablet 11  . furosemide (LASIX) 80 MG tablet Take 1 tablet (80 mg total) by mouth 2 (two) times daily. 60 tablet 1  . hydrALAZINE (APRESOLINE) 25 MG tablet Take 1 tablet (25 mg total) by mouth 3 (three) times daily. 90 tablet 1  . Iron-Vitamins (GERITOL TONIC PO) Take 5 mLs by mouth once a week.     . isosorbide mononitrate (IMDUR) 60 MG 24 hr tablet TAKE 1 TABLET (60 MG TOTAL) BY MOUTH EVERY MORNING. 30 tablet 5  . metFORMIN (GLUCOPHAGE) 500 MG tablet Take 500 mg by mouth 2 (two) times daily.    . Multiple Vitamin (MULTIVITAMIN WITH MINERALS) TABS tablet Take 1 tablet by mouth every morning.    . nitroGLYCERIN (NITROSTAT) 0.4 MG SL tablet Place 1 tablet (0.4 mg total) under the tongue every 5 (five) minutes as needed for chest  pain. 5 tablet 3  . simvastatin (ZOCOR) 40 MG tablet TAKE 1 TABLET (40 MG TOTAL) BY MOUTH AT BEDTIME. 30 tablet 3  . Vitamin D, Ergocalciferol, (DRISDOL) 50000 UNITS CAPS capsule Take 50,000 Units by mouth every 7 (seven) days.   7   No current facility-administered medications for this visit.    Allergies:   Piroxicam    Social History:  The patient  reports that she has never smoked. She has never used smokeless tobacco. She reports that she does not drink alcohol or use illicit drugs.   Family History:  The patient's family history includes Hypertension in her father and mother; Kidney failure in her father and mother.    ROS:  Please see the history of present illness.   Otherwise, review of systems are positive for none.   All other systems are reviewed and negative.    PHYSICAL EXAM: VS:  BP 124/66 mmHg  Pulse 60  Ht 5\' 5"  (1.651 m)  Wt 188 lb 6.4 oz (85.458 kg)  BMI 31.35 kg/m2  SpO2  98% , BMI Body mass index is 31.35 kg/(m^2). GEN: Well nourished, well developed, in no acute distress HEENT: normal Neck: no JVD, carotid bruits, or masses Cardiac: RRR; no murmurs, rubs, or gallops,no edema  Respiratory:  clear to auscultation bilaterally, normal work of breathing GI: soft, nontender, nondistended, + BS MS: no deformity or atrophy Skin: warm and dry, no rash Neuro:  Strength and sensation are intact Psych: euthymic mood, full affect   EKG:  EKG is not ordered today.    Recent Labs: 06/21/2014: B Natriuretic Peptide 1150.9* 06/28/2014: Hemoglobin 12.1; Platelets 441.0*; Pro B Natriuretic peptide (BNP) 1332.0* 08/13/2014: ALT 16; BUN 64*; Creatinine, Ser 2.32*; Potassium 4.3; Sodium 132*    Lipid Panel    Component Value Date/Time   CHOL 185 07/30/2012 1052   TRIG 119.0 07/30/2012 1052   HDL 59.70 07/30/2012 1052   CHOLHDL 3 07/30/2012 1052   VLDL 23.8 07/30/2012 1052   LDLCALC 102* 07/30/2012 1052   LDLDIRECT 142.5 08/11/2007 1021      Wt Readings from Last 3 Encounters:  08/23/14 188 lb 6.4 oz (85.458 kg)  08/23/14 188 lb (85.276 kg)  08/13/14 188 lb (85.276 kg)        ASSESSMENT AND PLAN:  1.  Severe OSA with AHI 37 events per hour.  She had a 2 week autotitration on BIPAP and was supposed to be set at 17/13cm H2O but [facility] has not changed the settings yet.  She is tolerating the auto BiPAP well.  She is still snoring and I suspect she is mouth breathing some and that may be why she is requiring a higher pressure. I will order a chin strap and keep on auto CPAP from 2-3 weeks and then get a  d/l in 2 weeks to assess AHI to determine set pressure.  I have also instructed the patient on proper sleep hygiene, avoidance of sleeping in the supine position and avoidance of alcohol within 4 hours of bedtime.  The patient was also instructed to avoid driving if sleepy.   2.  HTN - controlled on Cardizem/Hydralazine 3.  Obesity - she walks for exercise and I  encouraged her to try to walk daily.   Current medicines are reviewed at length with the patient today.  The patient does not have concerns regarding medicines.  The following changes have been made:  no change  Labs/ tests ordered today: See above Assessment and Plan No  orders of the defined types were placed in this encounter.     Disposition:   FU with me in 6 months  Signed, Sueanne Margarita, MD  08/23/2014 10:36 AM    [facility] [address], [place], [place]  [address] Phone: [phone]; Fax: [phone]

## 2014-08-30 DIAGNOSIS — G4733 Obstructive sleep apnea (adult) (pediatric): Secondary | ICD-10-CM | POA: Diagnosis not present

## 2014-08-31 ENCOUNTER — Other Ambulatory Visit: Payer: Self-pay | Admitting: Internal Medicine

## 2014-08-31 ENCOUNTER — Telehealth: Payer: Self-pay | Admitting: Internal Medicine

## 2014-08-31 MED ORDER — BENZONATATE 100 MG PO CAPS: 100.0000 mg | ORAL_CAPSULE | Freq: Two times a day (BID) | ORAL | Status: DC | PRN

## 2014-08-31 NOTE — Telephone Encounter (Signed)
Patient has a cold and would like to know if you could call some cough syrup to her pharmacy.

## 2014-08-31 NOTE — Telephone Encounter (Signed)
Please advise, thanks.

## 2014-08-31 NOTE — Telephone Encounter (Signed)
Have sent in [place] for cough. She can take 1 pill twice a day as needed for cough.

## 2014-09-01 ENCOUNTER — Other Ambulatory Visit: Payer: Self-pay | Admitting: Internal Medicine

## 2014-09-02 ENCOUNTER — Other Ambulatory Visit: Payer: Self-pay | Admitting: *Deleted

## 2014-09-02 ENCOUNTER — Other Ambulatory Visit: Payer: Self-pay | Admitting: Internal Medicine

## 2014-09-02 DIAGNOSIS — I5032 Chronic diastolic (congestive) heart failure: Secondary | ICD-10-CM

## 2014-09-02 MED ORDER — FUROSEMIDE 80 MG PO TABS: 80.0000 mg | ORAL_TABLET | Freq: Two times a day (BID) | ORAL | Status: DC

## 2014-09-02 NOTE — Telephone Encounter (Signed)
Refill completed.

## 2014-09-10 ENCOUNTER — Ambulatory Visit: Payer: Medicare Other | Admitting: Internal Medicine

## 2014-09-20 ENCOUNTER — Other Ambulatory Visit: Payer: Self-pay | Admitting: Internal Medicine

## 2014-09-30 DIAGNOSIS — G4733 Obstructive sleep apnea (adult) (pediatric): Secondary | ICD-10-CM | POA: Diagnosis not present

## 2014-10-07 ENCOUNTER — Telehealth: Payer: Self-pay | Admitting: Interventional Cardiology

## 2014-10-07 NOTE — Telephone Encounter (Signed)
Returned call to [employer] [facility] [employer]. Lmom. Will fwd message to our  MR dept to fwd info requested

## 2014-10-07 NOTE — Telephone Encounter (Signed)
New message    [facility] calling   Calling to confirm if ptient has Heart Failure.

## 2014-10-31 DIAGNOSIS — G4733 Obstructive sleep apnea (adult) (pediatric): Secondary | ICD-10-CM | POA: Diagnosis not present

## 2014-11-10 ENCOUNTER — Encounter: Payer: Self-pay | Admitting: Cardiology

## 2014-11-21 NOTE — Progress Notes (Signed)
Cardiology Office Note   Date:  11/24/2014   ID:  Wendy Kerr, Wendy Kerr [birth year], MRN [id number]  PCP:  Hoyt Koch, MD  Cardiologist:  Sinclair Grooms, MD   Chief Complaint  Patient presents with  . Atrial Fibrillation      History of Present Illness: BERDENE ASKARI is a 81 y.o. female who presents for diastolic heart failure, presumed coronary artery disease, permanent atrial flutter with rate control, anticoagulation, hypertension, and diabetes.  She is here today alone. She is ambulating better. She denies dyspnea. There is no lower extremity swelling. She is able to lie flat without dyspnea.    Past Medical History  Diagnosis Date  . Bronchitis, mucopurulent recurrent   . Hypertension   . Diabetes mellitus   . Hypercholesterolemia   . Obesity   . Diverticulosis of colon   . Benign neoplasm of pancreas, except islets of Langerhans   . DJD (degenerative joint disease)   . Vitamin D deficiency   . Common migraine   . Generalized anxiety disorder   . Anemia   . Monoclonal gammopathy   . OSA (obstructive sleep apnea)     severe with AHI 37 events per hour    Past Surgical History  Procedure Laterality Date  . Resection serous cystadenoma of pancreas  2004    at [facility]  . Abdominal hysterectomy       Current Outpatient Prescriptions  Medication Sig Dispense Refill  . apixaban (ELIQUIS) 2.5 MG TABS tablet Take 1 tablet (2.5 mg total) by mouth 2 (two) times daily. 60 tablet 11  . digoxin (LANOXIN) 0.125 MG tablet Take 0.5 tablets (0.0625 mg total) by mouth daily. 15 tablet 5  . diltiazem (CARDIZEM CD) 240 MG 24 hr capsule Take 1 capsule (240 mg total) by mouth daily. 60 capsule 5  . diltiazem (TIAZAC) 240 MG 24 hr capsule TAKE 1 CAPSULE (240 MG TOTAL) BY MOUTH DAILY. 30 capsule 3  . furosemide (LASIX) 80 MG tablet Take 1 tablet (80 mg total) by mouth 2 (two) times daily. 60 tablet 9  . hydrALAZINE (APRESOLINE) 25 MG tablet TAKE 1 TABLET BY MOUTH 3  TIMES DAILY 90 tablet 3  . isosorbide mononitrate (IMDUR) 60 MG 24 hr tablet TAKE 1 TABLET (60 MG TOTAL) BY MOUTH EVERY MORNING. 30 tablet 5  . metFORMIN (GLUCOPHAGE) 500 MG tablet Take 500 mg by mouth 2 (two) times daily.    . Multiple Vitamin (MULTIVITAMIN WITH MINERALS) TABS tablet Take 1 tablet by mouth every morning.    . nitroGLYCERIN (NITROSTAT) 0.4 MG SL tablet Place 1 tablet (0.4 mg total) under the tongue every 5 (five) minutes as needed for chest pain. 5 tablet 3  . OVER THE COUNTER MEDICATION Take 1 tablet by mouth daily. MED NAME: IRON    . simvastatin (ZOCOR) 40 MG tablet TAKE 1 TABLET (40 MG TOTAL) BY MOUTH AT BEDTIME. 30 tablet 3  . Vitamin D, Ergocalciferol, (DRISDOL) 50000 UNITS CAPS capsule Take 50,000 Units by mouth every 7 (seven) days.   7   No current facility-administered medications for this visit.    Allergies:   Piroxicam    Social History:  The patient  reports that she has never smoked. She has never used smokeless tobacco. She reports that she does not drink alcohol or use illicit drugs.   Family History:  The patient's family history includes Hypertension in her father and mother; Kidney failure in her father and mother.  ROS:  Please see the history of present illness.   Otherwise, review of systems are positive for no transient neurological complaints. Some easy bruising. Occasional abdominal pain. Some vision disturbance..   All other systems are reviewed and negative.    PHYSICAL EXAM: VS:  BP 148/86 mmHg  Pulse 74  Ht 5\' 5"  (1.651 m)  Wt 79.107 kg (174 lb 6.4 oz)  BMI 29.02 kg/m2 , BMI Body mass index is 29.02 kg/(m^2). GEN: Well nourished, well developed, in no acute distress HEENT: normal Neck: no JVD, carotid bruits, or masses Cardiac: IRR.  There is a 1/6 systolic right upper sternal border murmur, rub, or gallop. There is trace ankle edema. Respiratory:  clear to auscultation bilaterally, normal work of breathing. GI: soft, nontender,  nondistended, + BS MS: no deformity or atrophy Skin: warm and dry, no rash Neuro:  Strength and sensation are intact Psych: euthymic mood, full affect   EKG:  EKG is ordered today. The ekg reveals atrial flutter with 41 AV conduction, left axis deviation, and right bundle branch block.   Recent Labs: 06/21/2014: B Natriuretic Peptide 1150.9* 06/28/2014: Hemoglobin 12.1; Platelets 441.0*; Pro B Natriuretic peptide (BNP) 1332.0* 08/13/2014: ALT 16; BUN 64*; Creatinine, Ser 2.32*; Potassium 4.3; Sodium 132*    Lipid Panel    Component Value Date/Time   CHOL 185 07/30/2012 1052   TRIG 119.0 07/30/2012 1052   HDL 59.70 07/30/2012 1052   CHOLHDL 3 07/30/2012 1052   VLDL 23.8 07/30/2012 1052   LDLCALC 102* 07/30/2012 1052   LDLDIRECT 142.5 08/11/2007 1021      Wt Readings from Last 3 Encounters:  11/24/14 79.107 kg (174 lb 6.4 oz)  08/23/14 85.458 kg (188 lb 6.4 oz)  08/23/14 85.276 kg (188 lb)      Other studies Reviewed: Additional studies/ records that were reviewed today include: Reviewed prior laboratory data. The findings include renal insufficiency and suggests that we need further follow-up based upon her current medical regimen.    ASSESSMENT AND PLAN:  1. Typical atrial flutter With controlled rate  2. Chronic diastolic heart failure No evidence of volume overload  3. Essential hypertension Good control  4. Right bundle branch block Not reassessed  5. OSA (obstructive sleep apnea) Marked improvement on see Pap  6. CKD stage 3 due to type 2 diabetes mellitus Current status not known    Current medicines are reviewed at length with the patient today.  The patient has the following concerns regarding medicines: None.  The following changes/actions have been instituted:    No changes have been instituted  Labs/ tests ordered today include:   Orders Placed This Encounter  Procedures  . EKG 12-Lead     Disposition:   FU with HS in 8  months  Signed, Sinclair Grooms, MD  11/24/2014 11:11 AM    [address], [place], [place]  [address] Phone: [phone]; Fax: [phone]

## 2014-11-24 ENCOUNTER — Ambulatory Visit (INDEPENDENT_AMBULATORY_CARE_PROVIDER_SITE_OTHER): Payer: Medicare Other | Admitting: Interventional Cardiology

## 2014-11-24 ENCOUNTER — Encounter: Payer: Self-pay | Admitting: Interventional Cardiology

## 2014-11-24 VITALS — BP 148/86 | HR 74 | Ht 65.0 in | Wt 174.4 lb

## 2014-11-24 DIAGNOSIS — G4733 Obstructive sleep apnea (adult) (pediatric): Secondary | ICD-10-CM

## 2014-11-24 DIAGNOSIS — E1122 Type 2 diabetes mellitus with diabetic chronic kidney disease: Secondary | ICD-10-CM

## 2014-11-24 DIAGNOSIS — I5032 Chronic diastolic (congestive) heart failure: Secondary | ICD-10-CM

## 2014-11-24 DIAGNOSIS — I483 Typical atrial flutter: Secondary | ICD-10-CM | POA: Diagnosis not present

## 2014-11-24 DIAGNOSIS — I10 Essential (primary) hypertension: Secondary | ICD-10-CM

## 2014-11-24 DIAGNOSIS — I4510 Unspecified right bundle-branch block: Secondary | ICD-10-CM | POA: Diagnosis not present

## 2014-11-24 DIAGNOSIS — N183 Chronic kidney disease, stage 3 (moderate): Secondary | ICD-10-CM

## 2014-11-24 NOTE — Patient Instructions (Signed)
Medication Instructions:  Your physician recommends that you continue on your current medications as directed. Please refer to the Current Medication list given to you today.   Labwork: None ordered  Testing/Procedures: None ordered  Follow-Up: Your physician wants you to follow-up in: 6-9 months with Dr.Smith You will receive a reminder letter in the mail two months in advance. If you don't receive a letter, please call our office to schedule the follow-up appointment.   Any Other Special Instructions Will Be Listed Below (If Applicable).

## 2014-11-30 ENCOUNTER — Encounter: Payer: Self-pay | Admitting: Internal Medicine

## 2014-11-30 ENCOUNTER — Ambulatory Visit (INDEPENDENT_AMBULATORY_CARE_PROVIDER_SITE_OTHER): Payer: Medicare Other | Admitting: Internal Medicine

## 2014-11-30 ENCOUNTER — Other Ambulatory Visit (INDEPENDENT_AMBULATORY_CARE_PROVIDER_SITE_OTHER): Payer: Medicare Other

## 2014-11-30 VITALS — BP 140/80 | HR 86 | Temp 98.6°F | Resp 12 | Ht 65.0 in | Wt 178.0 lb

## 2014-11-30 DIAGNOSIS — E118 Type 2 diabetes mellitus with unspecified complications: Secondary | ICD-10-CM

## 2014-11-30 DIAGNOSIS — E669 Obesity, unspecified: Secondary | ICD-10-CM

## 2014-11-30 DIAGNOSIS — N183 Chronic kidney disease, stage 3 unspecified: Secondary | ICD-10-CM

## 2014-11-30 DIAGNOSIS — E559 Vitamin D deficiency, unspecified: Secondary | ICD-10-CM | POA: Diagnosis not present

## 2014-11-30 DIAGNOSIS — E1122 Type 2 diabetes mellitus with diabetic chronic kidney disease: Secondary | ICD-10-CM

## 2014-11-30 DIAGNOSIS — Z23 Encounter for immunization: Secondary | ICD-10-CM

## 2014-11-30 DIAGNOSIS — D509 Iron deficiency anemia, unspecified: Secondary | ICD-10-CM | POA: Diagnosis not present

## 2014-11-30 DIAGNOSIS — I10 Essential (primary) hypertension: Secondary | ICD-10-CM

## 2014-11-30 DIAGNOSIS — E119 Type 2 diabetes mellitus without complications: Secondary | ICD-10-CM

## 2014-11-30 DIAGNOSIS — G4733 Obstructive sleep apnea (adult) (pediatric): Secondary | ICD-10-CM | POA: Diagnosis not present

## 2014-11-30 DIAGNOSIS — E1169 Type 2 diabetes mellitus with other specified complication: Secondary | ICD-10-CM

## 2014-11-30 LAB — RENAL FUNCTION PANEL
ALBUMIN: 4.2 g/dL (ref 3.5–5.2)
BUN: 59 mg/dL — AB (ref 6–23)
CHLORIDE: 100 meq/L (ref 96–112)
CO2: 23 meq/L (ref 19–32)
CREATININE: 2.01 mg/dL — AB (ref 0.40–1.20)
Calcium: 9.7 mg/dL (ref 8.4–10.5)
GFR: 30.47 mL/min — ABNORMAL LOW (ref >60.00)
Glucose, Bld: 136 mg/dL — ABNORMAL HIGH (ref 70–99)
PHOSPHORUS: 3.7 mg/dL (ref 2.3–4.6)
Potassium: 4.2 mEq/L (ref 3.5–5.1)
SODIUM: 137 meq/L (ref 135–145)

## 2014-11-30 LAB — CBC
HCT: 35.2 % — ABNORMAL LOW (ref 36.0–46.0)
HEMOGLOBIN: 11.4 g/dL — AB (ref 12.0–15.0)
MCHC: 32.4 g/dL (ref 30.0–36.0)
MCV: 84.2 fl (ref 78.0–100.0)
Platelets: 417 10*3/uL — ABNORMAL HIGH (ref 150.0–400.0)
RBC: 4.19 Mil/uL (ref 3.87–5.11)
RDW: 15 % (ref 11.5–15.5)
WBC: 7.6 10*3/uL (ref 4.0–10.5)

## 2014-11-30 LAB — VITAMIN D 25 HYDROXY (VIT D DEFICIENCY, FRACTURES): VITD: 36.09 ng/mL (ref 30.00–100.00)

## 2014-11-30 LAB — HEMOGLOBIN A1C: Hgb A1c MFr Bld: 6.3 % (ref 4.6–6.5)

## 2014-11-30 LAB — FERRITIN: FERRITIN: 553.9 ng/mL — AB (ref 10.0–291.0)

## 2014-11-30 NOTE — Assessment & Plan Note (Signed)
Has ben on iron for many years and taking now. Checking CBC and ferritin if normal will stop the iron pills to simplify regimen.

## 2014-11-30 NOTE — Assessment & Plan Note (Signed)
BP at goal on diltiazem, lasix, hydralazine, imdur. Checking renal function panel and adjust as needed.

## 2014-11-30 NOTE — Assessment & Plan Note (Signed)
Checking vitamin D level as not checked in some time and given her worsening kidney function this may need to be stopped.

## 2014-11-30 NOTE — Progress Notes (Signed)
   Subjective:    Patient ID: Wendy Kerr, female    DOB: 1930/09/27, 81 y.o.   MRN: [id number]  HPI The patient is an 81 YO female coming in for follow up of her medical problems and with a new problem. She has diabetes (taking metformin, complicated by CKD), hypertension (controlled on diltiazem, hydralazine, imdur, lasix), her vitamin D deficiency (she has been taking weekly supplements for some time without monitoring). She has a new problem of diarrhea (urgency after eating, semi-formed stools, no blood in stools, going on for 1-2 weeks, has not tried anything for it, taking metformin).   Review of Systems  Constitutional: Negative for fever, activity change, appetite change, fatigue and unexpected weight change.  HENT: Negative.   Respiratory: Negative for cough, chest tightness, shortness of breath and wheezing.   Cardiovascular: Negative for chest pain and palpitations.  Gastrointestinal: Negative for abdominal pain, constipation and abdominal distention.  Genitourinary: Negative.   Musculoskeletal: Negative.   Skin: Negative.   Neurological: Negative.   Psychiatric/Behavioral: Negative.       Objective:   Physical Exam  Constitutional: She is oriented to person, place, and time. She appears well-developed and well-nourished.  overweight  HENT:  Head: Normocephalic and atraumatic.  Eyes: EOM are normal.  Neck: Normal range of motion.  Cardiovascular: Normal rate and regular rhythm.   Pulmonary/Chest: Effort normal and breath sounds normal. No respiratory distress. She has no wheezes. She has no rales.  Abdominal: Soft. Bowel sounds are normal. She exhibits no distension. There is no tenderness. There is no rebound.  Musculoskeletal: She exhibits no edema.  Neurological: She is alert and oriented to person, place, and time.  Skin: Skin is warm and dry.   Filed Vitals:   11/30/14 0832  BP: 140/80  Pulse: 86  Temp: 98.6 F (37 C)  TempSrc: Oral  Resp: 12  Height:  5\' 5"  (1.651 m)  Weight: 178 lb (80.74 kg)  SpO2: 98%      Assessment & Plan:  Flu and prevnar 13 given at visit.

## 2014-11-30 NOTE — Patient Instructions (Signed)
We will give you the flu shot and the pneumonia shot today.   We are checking the blood work today and would like you to stop taking metformin. This should help with the diarrhea. After we get the labs back we will decide if we need another medicine for the diabetes or not.   We may have you see a kidney doctor as well to talk to them about monitoring your kidneys.   Come back in about 3 months or sooner if you are having problems or questions.   Diabetes and Standards of Medical Care Diabetes is complicated. You may find that your diabetes team includes a dietitian, nurse, diabetes educator, eye doctor, and more. To help everyone know what is going on and to help you get the care you deserve, the following schedule of care was developed to help keep you on track. Below are the tests, exams, vaccines, medicines, education, and plans you will need. HbA1c test This test shows how well you have controlled your glucose over the past 2-3 months. It is used to see if your diabetes management plan needs to be adjusted.   It is performed at least 2 times a year if you are meeting treatment goals.  It is performed 4 times a year if therapy has changed or if you are not meeting treatment goals. Blood pressure test  This test is performed at every routine medical visit. The goal is less than 140/90 mm Hg for most people, but 130/80 mm Hg in some cases. Ask your health care provider about your goal. Dental exam  Follow up with the dentist regularly. Eye exam  If you are diagnosed with type 1 diabetes as a child, get an exam upon reaching the age of 26 years or older and have had diabetes for 3-5 years. Yearly eye exams are recommended after that initial eye exam.  If you are diagnosed with type 1 diabetes as an adult, get an exam within 5 years of diagnosis and then yearly.  If you are diagnosed with type 2 diabetes, get an exam as soon as possible after the diagnosis and then yearly. Foot care  exam  Visual foot exams are performed at every routine medical visit. The exams check for cuts, injuries, or other problems with the feet.  A comprehensive foot exam should be done yearly. This includes visual inspection as well as assessing foot pulses and testing for loss of sensation.  Check your feet nightly for cuts, injuries, or other problems with your feet. Tell your health care provider if anything is not healing. Kidney function test (urine microalbumin)  This test is performed once a year.  Type 1 diabetes: The first test is performed 5 years after diagnosis.  Type 2 diabetes: The first test is performed at the time of diagnosis.  A serum creatinine and estimated glomerular filtration rate (eGFR) test is done once a year to assess the level of chronic kidney disease (CKD), if present. Lipid profile (cholesterol, HDL, LDL, triglycerides)  Performed every 5 years for most people.  The goal for LDL is less than 100 mg/dL. If you are at high risk, the goal is less than 70 mg/dL.  The goal for HDL is 40 mg/dL-50 mg/dL for men and 50 mg/dL-60 mg/dL for women. An HDL cholesterol of 60 mg/dL or higher gives some protection against heart disease.  The goal for triglycerides is less than 150 mg/dL. Influenza vaccine, pneumococcal vaccine, and hepatitis B vaccine  The influenza vaccine is  recommended yearly.  It is recommended that people with diabetes who are over 35 years old get the pneumonia vaccine. In some cases, two separate shots may be given. Ask your health care provider if your pneumonia vaccination is up to date.  The hepatitis B vaccine is also recommended for adults with diabetes. Diabetes self-management education  Education is recommended at diagnosis and ongoing as needed. Treatment plan  Your treatment plan is reviewed at every medical visit. Document Released: 12/10/2008 Document Revised: 06/29/2013 Document Reviewed: 07/15/2012 [facility] Patient Information  2015 [place], [place]. This information is not intended to replace advice given to you by your health care provider. Make sure you discuss any questions you have with your health care provider.

## 2014-11-30 NOTE — Assessment & Plan Note (Signed)
Checking renal function panel today and likely is now CKD stage 4 and if so will refer to nephrology. Her BP is well controlled and her diabetes. Medication stopped today due to GFR: metformin.

## 2014-11-30 NOTE — Assessment & Plan Note (Signed)
Complicated by CKD stage 3/4. Stopping metformin due to insufficient GFR. Adjust as needed but last HgA1c 6.4 so may observe off medications. Now on ACE-I or ARB for unknown reason. Will review records.

## 2014-11-30 NOTE — Progress Notes (Signed)
Pre visit review using our clinic review tool, if applicable. No additional management support is needed unless otherwise documented below in the visit note. 

## 2014-12-02 ENCOUNTER — Telehealth: Payer: Self-pay | Admitting: Internal Medicine

## 2014-12-02 NOTE — Telephone Encounter (Signed)
Pt's son called request lab result that was done 3 days ago. Pt stated Dr. Sharlet Salina was going to take the pt off of metformin due to A1C being low, Please check about this.

## 2014-12-03 ENCOUNTER — Telehealth: Payer: Self-pay | Admitting: Internal Medicine

## 2014-12-03 NOTE — Telephone Encounter (Signed)
Sugars are 6.4. Per Dr. Sharlet Salina, patient may stop metformin.

## 2014-12-03 NOTE — Telephone Encounter (Signed)
Pt called stated she is experiencing some nausea when she get up, so she was wondering if she can get something call in before the weekend. Please advise, offer an appt but pt doesn't want to come in. Please call [phone]

## 2014-12-03 NOTE — Telephone Encounter (Signed)
Spoke with patient's son and informed him to have his mother try tums, ginger ale, and saltines over the weekend. If that does not work, call us back next week.

## 2014-12-03 NOTE — Telephone Encounter (Signed)
Would recommend foods that are easy on her stomach and to try tums over the counter.

## 2014-12-03 NOTE — Telephone Encounter (Signed)
Wendy Kerr called to follow up. Advised of below note

## 2014-12-31 DIAGNOSIS — G4733 Obstructive sleep apnea (adult) (pediatric): Secondary | ICD-10-CM | POA: Diagnosis not present

## 2015-01-03 ENCOUNTER — Other Ambulatory Visit: Payer: Self-pay | Admitting: Internal Medicine

## 2015-01-08 ENCOUNTER — Other Ambulatory Visit: Payer: Self-pay | Admitting: Internal Medicine

## 2015-01-11 ENCOUNTER — Other Ambulatory Visit: Payer: Self-pay | Admitting: Interventional Cardiology

## 2015-01-30 DIAGNOSIS — G4733 Obstructive sleep apnea (adult) (pediatric): Secondary | ICD-10-CM | POA: Diagnosis not present

## 2015-02-09 ENCOUNTER — Other Ambulatory Visit: Payer: Self-pay | Admitting: Internal Medicine

## 2015-02-11 ENCOUNTER — Encounter: Payer: Self-pay | Admitting: Internal Medicine

## 2015-02-11 ENCOUNTER — Ambulatory Visit (INDEPENDENT_AMBULATORY_CARE_PROVIDER_SITE_OTHER): Payer: Medicare Other | Admitting: Internal Medicine

## 2015-02-11 VITALS — BP 142/90 | HR 84 | Temp 98.3°F | Resp 16 | Ht 65.0 in | Wt 177.0 lb

## 2015-02-11 DIAGNOSIS — Z0000 Encounter for general adult medical examination without abnormal findings: Secondary | ICD-10-CM | POA: Insufficient documentation

## 2015-02-11 NOTE — Assessment & Plan Note (Signed)
Reviewed labs from last visit and no need for repeat today. Declines shingles shot today and is not sure she needs a bone density. Counseled about the need for exercise and activities to keep her mind active (she has 4 siblings within walking distance). Given 10 year screening recommendations.

## 2015-02-11 NOTE — Patient Instructions (Signed)
We have sent in the refills of the hydralazine.   We do not need any blood work today.   Keep up the good work with walking some for exercise. You are doing well.   We will see you back in about 3-6 months for a check up.   Health Maintenance, Female Adopting a healthy lifestyle and getting preventive care can go a long way to promote health and wellness. Talk with your health care provider about what schedule of regular examinations is right for you. This is a good chance for you to check in with your provider about disease prevention and staying healthy. In between checkups, there are plenty of things you can do on your own. Experts have done a lot of research about which lifestyle changes and preventive measures are most likely to keep you healthy. Ask your health care provider for more information. WEIGHT AND DIET  Eat a healthy diet  Be sure to include plenty of vegetables, fruits, low-fat dairy products, and lean protein.  Do not eat a lot of foods high in solid fats, added sugars, or salt.  Get regular exercise. This is one of the most important things you can do for your health.  Most adults should exercise for at least 150 minutes each week. The exercise should increase your heart rate and make you sweat (moderate-intensity exercise).  Most adults should also do strengthening exercises at least twice a week. This is in addition to the moderate-intensity exercise.  Maintain a healthy weight  Body mass index (BMI) is a measurement that can be used to identify possible weight problems. It estimates body fat based on height and weight. Your health care provider can help determine your BMI and help you achieve or maintain a healthy weight.  For females 30 years of age and older:   A BMI below 18.5 is considered underweight.  A BMI of 18.5 to 24.9 is normal.  A BMI of 25 to 29.9 is considered overweight.  A BMI of 30 and above is considered obese.  Watch levels of  cholesterol and blood lipids  You should start having your blood tested for lipids and cholesterol at 79 years of age, then have this test every 5 years.  You may need to have your cholesterol levels checked more often if:  Your lipid or cholesterol levels are high.  You are older than 79 years of age.  You are at high risk for heart disease.  CANCER SCREENING   Lung Cancer  Lung cancer screening is recommended for adults 66-51 years old who are at high risk for lung cancer because of a history of smoking.  A yearly low-dose CT scan of the lungs is recommended for people who:  Currently smoke.  Have quit within the past 15 years.  Have at least a 30-pack-year history of smoking. A pack year is smoking an average of one pack of cigarettes a day for 1 year.  Yearly screening should continue until it has been 15 years since you quit.  Yearly screening should stop if you develop a health problem that would prevent you from having lung cancer treatment.  Breast Cancer  Practice breast self-awareness. This means understanding how your breasts normally appear and feel.  It also means doing regular breast self-exams. Let your health care provider know about any changes, no matter how small.  If you are in your 20s or 30s, you should have a clinical breast exam (CBE) by a health care provider every  1-3 years as part of a regular health exam.  If you are 19 or older, have a CBE every year. Also consider having a breast X-ray (mammogram) every year.  If you have a family history of breast cancer, talk to your health care provider about genetic screening.  If you are at high risk for breast cancer, talk to your health care provider about having an MRI and a mammogram every year.  Breast cancer gene (BRCA) assessment is recommended for women who have family members with BRCA-related cancers. BRCA-related cancers include:  Breast.  Ovarian.  Tubal.  Peritoneal  cancers.  Results of the assessment will determine the need for genetic counseling and BRCA1 and BRCA2 testing. Cervical Cancer Your health care provider may recommend that you be screened regularly for cancer of the pelvic organs (ovaries, uterus, and vagina). This screening involves a pelvic examination, including checking for microscopic changes to the surface of your cervix (Pap test). You may be encouraged to have this screening done every 3 years, beginning at age 46.  For women ages 34-65, health care providers may recommend pelvic exams and Pap testing every 3 years, or they may recommend the Pap and pelvic exam, combined with testing for human papilloma virus (HPV), every 5 years. Some types of HPV increase your risk of cervical cancer. Testing for HPV may also be done on women of any age with unclear Pap test results.  Other health care providers may not recommend any screening for nonpregnant women who are considered low risk for pelvic cancer and who do not have symptoms. Ask your health care provider if a screening pelvic exam is right for you.  If you have had past treatment for cervical cancer or a condition that could lead to cancer, you need Pap tests and screening for cancer for at least 20 years after your treatment. If Pap tests have been discontinued, your risk factors (such as having a new sexual partner) need to be reassessed to determine if screening should resume. Some women have medical problems that increase the chance of getting cervical cancer. In these cases, your health care provider may recommend more frequent screening and Pap tests. Colorectal Cancer  This type of cancer can be detected and often prevented.  Routine colorectal cancer screening usually begins at 79 years of age and continues through 79 years of age.  Your health care provider may recommend screening at an earlier age if you have risk factors for colon cancer.  Your health care provider may also  recommend using home test kits to check for hidden blood in the stool.  A small camera at the end of a tube can be used to examine your colon directly (sigmoidoscopy or colonoscopy). This is done to check for the earliest forms of colorectal cancer.  Routine screening usually begins at age 59.  Direct examination of the colon should be repeated every 5-10 years through 79 years of age. However, you may need to be screened more often if early forms of precancerous polyps or small growths are found. Skin Cancer  Check your skin from head to toe regularly.  Tell your health care provider about any new moles or changes in moles, especially if there is a change in a mole's shape or color.  Also tell your health care provider if you have a mole that is larger than the size of a pencil eraser.  Always use sunscreen. Apply sunscreen liberally and repeatedly throughout the day.  Protect yourself by wearing  long sleeves, pants, a wide-brimmed hat, and sunglasses whenever you are outside. HEART DISEASE, DIABETES, AND HIGH BLOOD PRESSURE   High blood pressure causes heart disease and increases the risk of stroke. High blood pressure is more likely to develop in:  People who have blood pressure in the high end of the normal range (130-139/85-89 mm Hg).  People who are overweight or obese.  People who are African American.  If you are 54-79 years of age, have your blood pressure checked every 3-5 years. If you are 53 years of age or older, have your blood pressure checked every year. You should have your blood pressure measured twice--once when you are at a hospital or clinic, and once when you are not at a hospital or clinic. Record the average of the two measurements. To check your blood pressure when you are not at a hospital or clinic, you can use:  An automated blood pressure machine at a pharmacy.  A home blood pressure monitor.  If you are between 57 years and 4 years old, ask your health  care provider if you should take aspirin to prevent strokes.  Have regular diabetes screenings. This involves taking a blood sample to check your fasting blood sugar level.  If you are at a normal weight and have a low risk for diabetes, have this test once every three years after 79 years of age.  If you are overweight and have a high risk for diabetes, consider being tested at a younger age or more often. PREVENTING INFECTION  Hepatitis B  If you have a higher risk for hepatitis B, you should be screened for this virus. You are considered at high risk for hepatitis B if:  You were born in a country where hepatitis B is common. Ask your health care provider which countries are considered high risk.  Your parents were born in a high-risk country, and you have not been immunized against hepatitis B (hepatitis B vaccine).  You have HIV or AIDS.  You use needles to inject street drugs.  You live with someone who has hepatitis B.  You have had sex with someone who has hepatitis B.  You get hemodialysis treatment.  You take certain medicines for conditions, including cancer, organ transplantation, and autoimmune conditions. Hepatitis C  Blood testing is recommended for:  Everyone born from [birth year] through 1963.  Anyone with known risk factors for hepatitis C. Sexually transmitted infections (STIs)  You should be screened for sexually transmitted infections (STIs) including gonorrhea and chlamydia if:  You are sexually active and are younger than 79 years of age.  You are older than 79 years of age and your health care provider tells you that you are at risk for this type of infection.  Your sexual activity has changed since you were last screened and you are at an increased risk for chlamydia or gonorrhea. Ask your health care provider if you are at risk.  If you do not have HIV, but are at risk, it may be recommended that you take a prescription medicine daily to prevent HIV  infection. This is called pre-exposure prophylaxis (PrEP). You are considered at risk if:  You are sexually active and do not regularly use condoms or know the HIV status of your partner(s).  You take drugs by injection.  You are sexually active with a partner who has HIV. Talk with your health care provider about whether you are at high risk of being infected with HIV. If you  choose to begin PrEP, you should first be tested for HIV. You should then be tested every 3 months for as long as you are taking PrEP.  PREGNANCY   If you are premenopausal and you may become pregnant, ask your health care provider about preconception counseling.  If you may become pregnant, take 400 to 800 micrograms (mcg) of folic acid every day.  If you want to prevent pregnancy, talk to your health care provider about birth control (contraception). OSTEOPOROSIS AND MENOPAUSE   Osteoporosis is a disease in which the bones lose minerals and strength with aging. This can result in serious bone fractures. Your risk for osteoporosis can be identified using a bone density scan.  If you are 61 years of age or older, or if you are at risk for osteoporosis and fractures, ask your health care provider if you should be screened.  Ask your health care provider whether you should take a calcium or vitamin D supplement to lower your risk for osteoporosis.  Menopause may have certain physical symptoms and risks.  Hormone replacement therapy may reduce some of these symptoms and risks. Talk to your health care provider about whether hormone replacement therapy is right for you.  HOME CARE INSTRUCTIONS   Schedule regular health, dental, and eye exams.  Stay current with your immunizations.   Do not use any tobacco products including cigarettes, chewing tobacco, or electronic cigarettes.  If you are pregnant, do not drink alcohol.  If you are breastfeeding, limit how much and how often you drink alcohol.  Limit  alcohol intake to no more than 1 drink per day for nonpregnant women. One drink equals 12 ounces of beer, 5 ounces of wine, or 1 ounces of hard liquor.  Do not use street drugs.  Do not share needles.  Ask your health care provider for help if you need support or information about quitting drugs.  Tell your health care provider if you often feel depressed.  Tell your health care provider if you have ever been abused or do not feel safe at home.   This information is not intended to replace advice given to you by your health care provider. Make sure you discuss any questions you have with your health care provider.   Document Released: 08/28/2010 Document Revised: 03/05/2014 Document Reviewed: 01/14/2013 Elsevier Interactive Patient Education [employer].

## 2015-02-11 NOTE — Progress Notes (Signed)
Pre visit review using our clinic review tool, if applicable. No additional management support is needed unless otherwise documented below in the visit note. 

## 2015-02-11 NOTE — Progress Notes (Signed)
   Subjective:    Patient ID: Wendy Kerr, female    DOB: February 29, 1930, 81 y.o.   MRN: [id number]  HPI Here for medicare wellness, no new complaints. Please see A/P for status and treatment of chronic medical problems.   Diet: heart healthy Physical activity: sedentary Depression/mood screen: negative Hearing: intact to whispered voice Visual acuity: grossly normal, performs annual eye exam  ADLs: capable Fall risk: none Home safety: good Cognitive evaluation: intact to orientation, naming, recall and repetition EOL planning: adv directives discussed, not in place  I have personally reviewed and have noted 1. The patient's medical and social history - reviewed today no changes 2. Their use of alcohol, tobacco or illicit drugs 3. Their current medications and supplements 4. The patient's functional ability including ADL's, fall risks, home safety risks and hearing or visual impairment. 5. Diet and physical activities 6. Evidence for depression or mood disorders 7. Care team reviewed and updated (available in snapshot)  Review of Systems  Constitutional: Negative for fever, activity change, appetite change, fatigue and unexpected weight change.  HENT: Negative.   Respiratory: Negative for cough, chest tightness, shortness of breath and wheezing.   Cardiovascular: Negative for chest pain and palpitations.  Gastrointestinal: Negative for abdominal pain, constipation and abdominal distention.  Genitourinary: Negative.   Musculoskeletal: Negative.   Skin: Negative.   Neurological: Negative.   Psychiatric/Behavioral: Negative.       Objective:   Physical Exam  Constitutional: She is oriented to person, place, and time. She appears well-developed and well-nourished.  overweight  HENT:  Head: Normocephalic and atraumatic.  Eyes: EOM are normal.  Neck: Normal range of motion.  Cardiovascular: Normal rate and regular rhythm.   Pulmonary/Chest: Effort normal and breath sounds  normal. No respiratory distress. She has no wheezes. She has no rales.  Abdominal: Soft. Bowel sounds are normal. She exhibits no distension. There is no tenderness. There is no rebound.  Musculoskeletal: She exhibits no edema.  Neurological: She is alert and oriented to person, place, and time.  Skin: Skin is warm and dry.    Filed Vitals:   02/11/15 1019  BP: 160/90  Pulse: 84  Temp: 98.3 F (36.8 C)  TempSrc: Oral  Resp: 16  Height: 5\' 5"  (1.651 m)  Weight: 177 lb (80.287 kg)  SpO2: 99%      Assessment & Plan:

## 2015-02-12 ENCOUNTER — Other Ambulatory Visit: Payer: Self-pay | Admitting: Interventional Cardiology

## 2015-03-02 DIAGNOSIS — G4733 Obstructive sleep apnea (adult) (pediatric): Secondary | ICD-10-CM | POA: Diagnosis not present

## 2015-03-04 ENCOUNTER — Encounter: Payer: Self-pay | Admitting: Cardiology

## 2015-03-04 ENCOUNTER — Ambulatory Visit (INDEPENDENT_AMBULATORY_CARE_PROVIDER_SITE_OTHER): Payer: Medicare Other | Admitting: Cardiology

## 2015-03-04 VITALS — BP 158/90 | HR 80 | Ht 65.0 in | Wt 175.4 lb

## 2015-03-04 DIAGNOSIS — G4733 Obstructive sleep apnea (adult) (pediatric): Secondary | ICD-10-CM | POA: Diagnosis not present

## 2015-03-04 NOTE — Progress Notes (Signed)
Cardiology Office Note   Date:  03/04/2015   ID:  Wendy, Kerr 06-10-1930, MRN [id number]  PCP:  Hoyt Koch, MD    Chief Complaint  Patient presents with  . Sleep Apnea      History of Present Illness: Wendy Kerr is a 82 y.o. female who presents for followup of sleep apnea. She has severe OSA with an AHI of 37 events per hour and is on  on the BIPAP. She uses a nasal pillow mask with  chin strap. She does not think that she is snoring anymore. She denies any mouth dryness or nasal congestion. She feels rested in the am and has no daytime sleepiness.      Past Medical History  Diagnosis Date  . Bronchitis, mucopurulent recurrent ([place])   . Hypertension   . Diabetes mellitus ([place])   . Hypercholesterolemia   . Obesity   . Diverticulosis of colon   . Benign neoplasm of pancreas, except islets of Langerhans   . DJD (degenerative joint disease)   . Vitamin D deficiency   . Common migraine   . Generalized anxiety disorder   . Anemia   . Monoclonal gammopathy   . OSA (obstructive sleep apnea)     severe with AHI 37 events per hour    Past Surgical History  Procedure Laterality Date  . Resection serous cystadenoma of pancreas  2004    at [facility]  . Abdominal hysterectomy       Current Outpatient Prescriptions  Medication Sig Dispense Refill  . apixaban (ELIQUIS) 2.5 MG TABS tablet Take 1 tablet (2.5 mg total) by mouth 2 (two) times daily. 60 tablet 11  . digoxin (LANOXIN) 0.125 MG tablet TAKE 0.5 TABLETS (0.0625 MG TOTAL) BY MOUTH DAILY. 15 tablet 3  . diltiazem (CARDIZEM CD) 240 MG 24 hr capsule Take 1 capsule (240 mg total) by mouth daily. 60 capsule 5  . furosemide (LASIX) 80 MG tablet Take 1 tablet (80 mg total) by mouth 2 (two) times daily. 60 tablet 9  . hydrALAZINE (APRESOLINE) 25 MG tablet TAKE 1 TABLET BY MOUTH 3 TIMES DAILY 90 tablet 3  . isosorbide mononitrate (IMDUR) 60 MG 24 hr tablet TAKE 1 TABLET (60 MG TOTAL) BY  MOUTH EVERY MORNING. 30 tablet 8  . Multiple Vitamin (MULTIVITAMIN WITH MINERALS) TABS tablet Take 1 tablet by mouth every morning.    . nitroGLYCERIN (NITROSTAT) 0.4 MG SL tablet Place 1 tablet (0.4 mg total) under the tongue every 5 (five) minutes as needed for chest pain. 5 tablet 3  . OVER THE COUNTER MEDICATION Take 1 tablet by mouth daily. MED NAME: IRON    . simvastatin (ZOCOR) 40 MG tablet TAKE 1 TABLET (40 MG TOTAL) BY MOUTH AT BEDTIME. 30 tablet 3  . Vitamin D, Ergocalciferol, (DRISDOL) 50000 UNITS CAPS capsule Take 50,000 Units by mouth every 7 (seven) days. Reported on 02/11/2015  7  . Vitamin D, Ergocalciferol, (DRISDOL) 50000 UNITS CAPS capsule TAKE ONE CAPSULE BY MOUTH ONCE A WEEK 4 capsule 4   No current facility-administered medications for this visit.    Allergies:   Piroxicam    Social History:  The patient  reports that she has never smoked. She has never used smokeless tobacco. She reports that she does not drink alcohol or use illicit drugs.   Family History:  The patient's family history includes Hypertension in her  father and mother; Kidney failure in her father and mother.    ROS:  Please see the history of present illness.   Otherwise, review of systems are positive for none.   All other systems are reviewed and negative.    PHYSICAL EXAM: VS:  BP 158/90 mmHg  Pulse 80  Ht 5\' 5"  (1.651 m)  Wt 175 lb 6.4 oz (79.561 kg)  BMI 29.19 kg/m2 , BMI Body mass index is 29.19 kg/(m^2). GEN: Well nourished, well developed, in no acute distress HEENT: normal Neck: no JVD, carotid bruits, or masses Cardiac: RRR; no murmurs, rubs, or gallops,no edema  Respiratory:  clear to auscultation bilaterally, normal work of breathing GI: soft, nontender, nondistended, + BS MS: no deformity or atrophy Skin: warm and dry, no rash Neuro:  Strength and sensation are intact Psych: euthymic mood, full affect   EKG:  EKG is not ordered today.    Recent Labs: 06/21/2014: B  Natriuretic Peptide 1150.9* 06/28/2014: Pro B Natriuretic peptide (BNP) 1332.0* 08/13/2014: ALT 16 11/30/2014: BUN 59*; Creatinine, Ser 2.01*; Hemoglobin 11.4*; Platelets 417.0*; Potassium 4.2; Sodium 137    Lipid Panel    Component Value Date/Time   CHOL 185 07/30/2012 1052   TRIG 119.0 07/30/2012 1052   HDL 59.70 07/30/2012 1052   CHOLHDL 3 07/30/2012 1052   VLDL 23.8 07/30/2012 1052   LDLCALC 102* 07/30/2012 1052   LDLDIRECT 142.5 08/11/2007 1021      Wt Readings from Last 3 Encounters:  03/04/15 175 lb 6.4 oz (79.561 kg)  02/11/15 177 lb (80.287 kg)  11/30/14 178 lb (80.74 kg)    ASSESSMENT AND PLAN:  1. Severe OSA with AHI 37 events per hour. She had been on auto BiPAP with a max IPAP at 16cm H2O and min EPAP at 12cm H2O.  I will set her device at these settings and repeat d/l in 2 weeks.  Patient has been using and benefiting from CPAP use and will continue to benefit from therapy. I have also instructed the patient on proper sleep hygiene, avoidance of sleeping in the supine position and avoidance of alcohol within 4 hours of bedtime.  The patient was also instructed to avoid driving if sleepy.   2. HTN - controlled on Cardizem/Hydralazine.  I have asked her to get a new BP cuff and check her BP daily for a week and call with the results.  3. Obesity - she walks for exercise and I encouraged her to try to walk daily but it currently is limited by tendonitis in her foot.    Current medicines are reviewed at length with the patient today.  The patient does not have concerns regarding medicines.  The following changes have been made:  no change  Labs/ tests ordered today: See above Assessment and Plan No orders of the defined types were placed in this encounter.     Disposition:   FU with me in 6 months  Signed, Sueanne Margarita, MD  03/04/2015 10:19 AM    [facility] [address], [place], [place]  [address] Phone: [phone]; Fax: [phone]

## 2015-03-04 NOTE — Patient Instructions (Addendum)

## 2015-03-10 ENCOUNTER — Encounter: Payer: Self-pay | Admitting: Cardiology

## 2015-03-30 IMAGING — CR DG CHEST 2V
2 series · 2 of 2 positions shown · non-contrast
Comparison: Prior radiograph from 01/29/2011

CLINICAL DATA: Warm sensation in chest

EXAM:
CHEST  2 VIEW

[w chest lat]
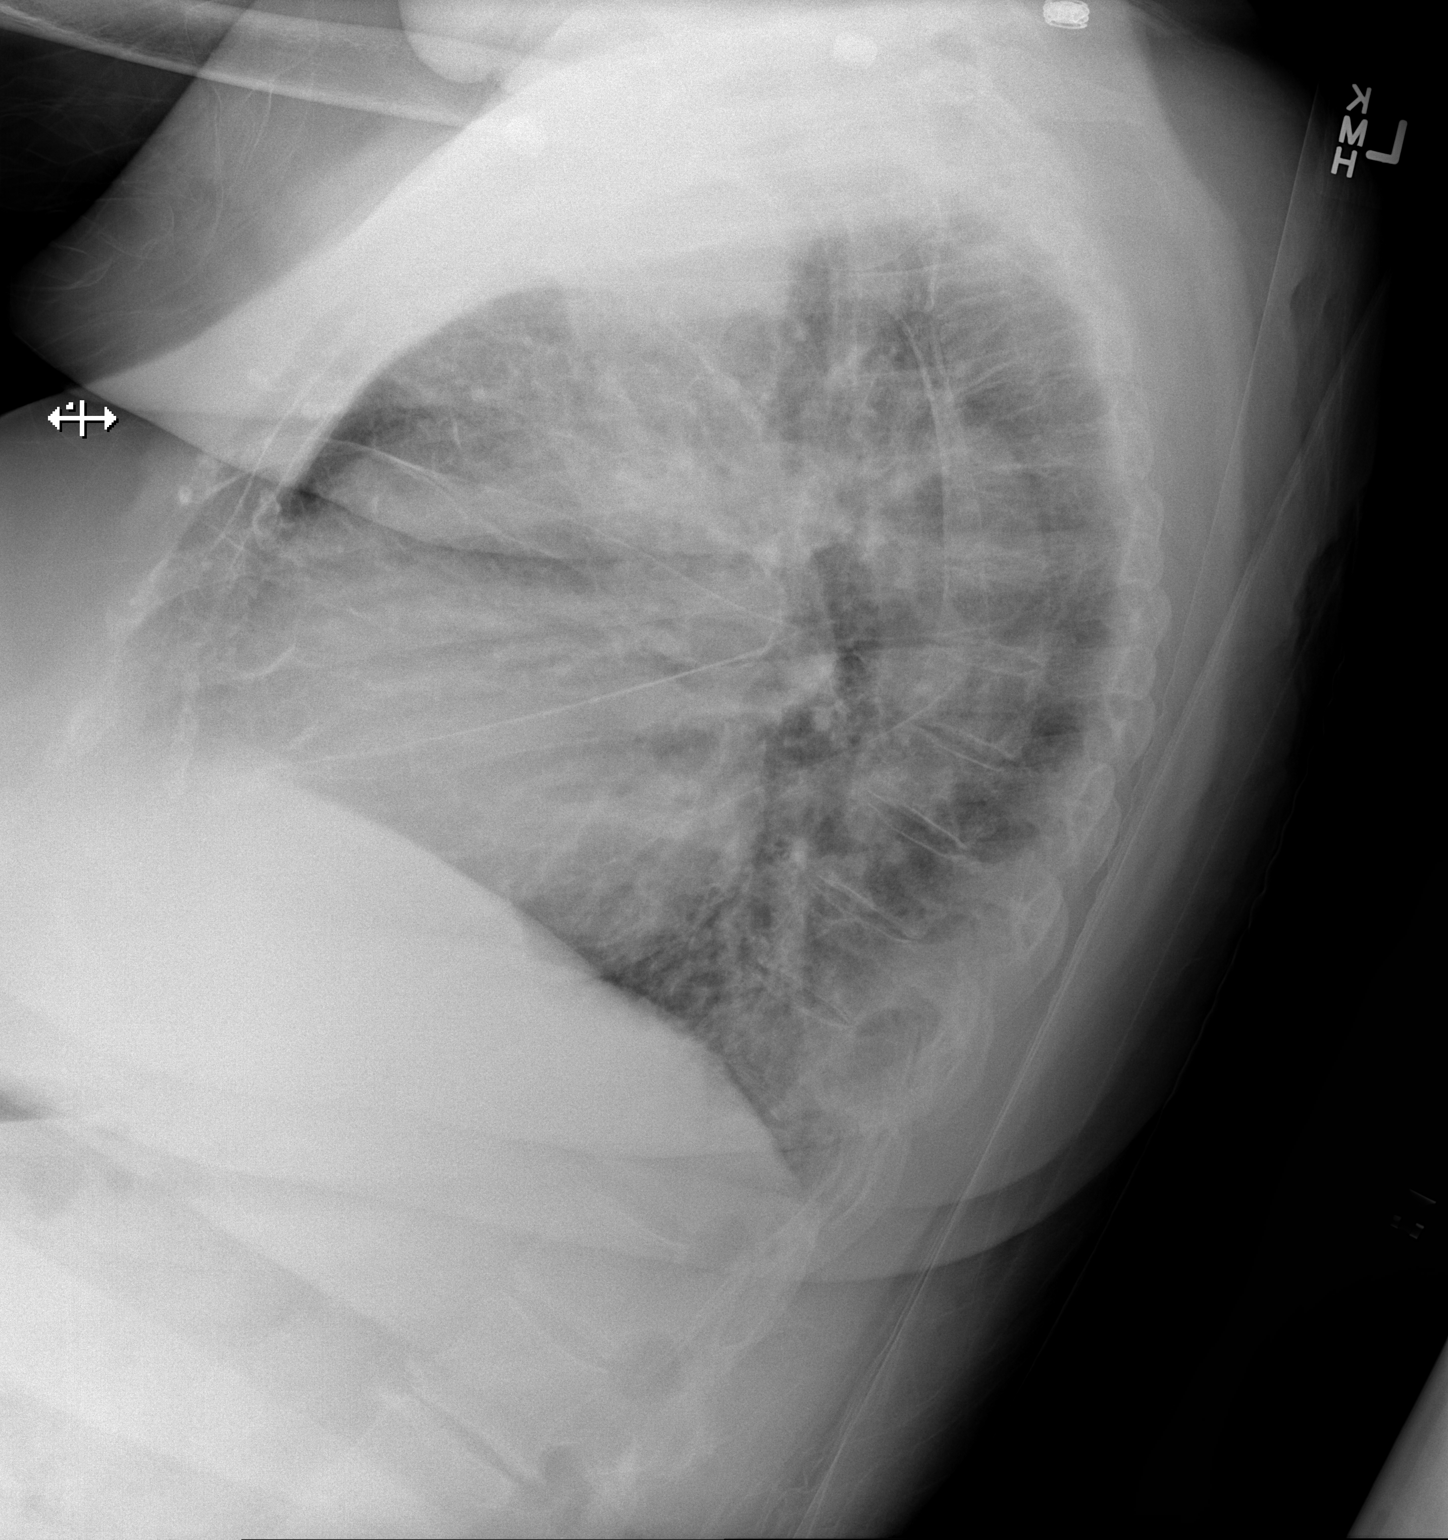

[x chest ap]
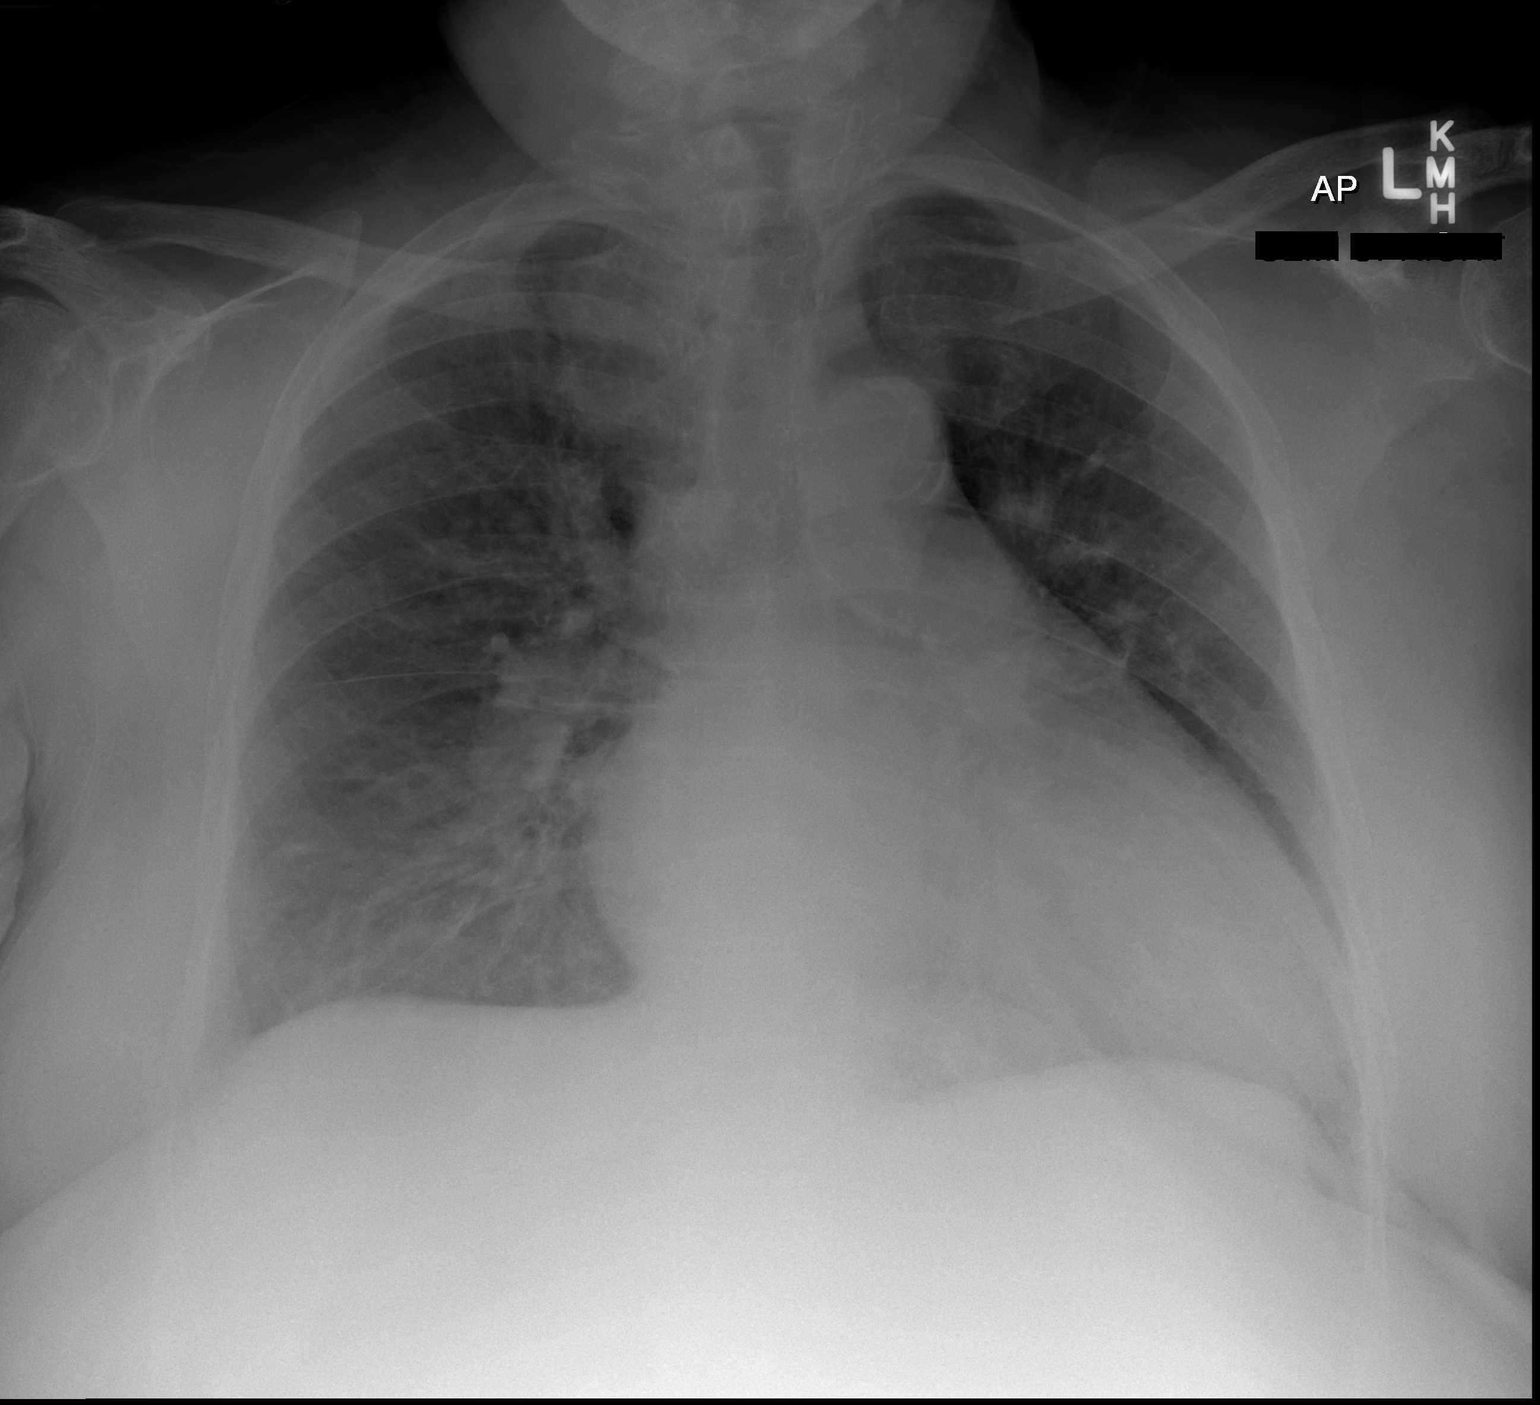

[2 of 2 positions shown; findings below may reference images not displayed]

FINDINGS: There is marked the cardiomegaly, which is likely partly accentuated
by AP technique.

Lungs are normally inflated. There is diffuse pulmonary vascular
congestion with mild interstitial thickening. No definite focal
infiltrate is identified. There is no pleural effusion or
pneumothorax.

Osseous structures are within normal limits.
IMPRESSION: Cardiomegaly with mild diffuse pulmonary vascular congestion.

## 2015-04-02 DIAGNOSIS — G4733 Obstructive sleep apnea (adult) (pediatric): Secondary | ICD-10-CM | POA: Diagnosis not present

## 2015-04-26 ENCOUNTER — Ambulatory Visit: Payer: Medicare Other | Admitting: Cardiology

## 2015-05-02 ENCOUNTER — Other Ambulatory Visit: Payer: Self-pay | Admitting: Internal Medicine

## 2015-05-19 ENCOUNTER — Other Ambulatory Visit: Payer: Self-pay | Admitting: Interventional Cardiology

## 2015-06-29 ENCOUNTER — Other Ambulatory Visit: Payer: Self-pay | Admitting: Internal Medicine

## 2015-07-02 ENCOUNTER — Encounter: Payer: Self-pay | Admitting: Family Medicine

## 2015-07-02 ENCOUNTER — Ambulatory Visit (INDEPENDENT_AMBULATORY_CARE_PROVIDER_SITE_OTHER): Payer: Medicare Other | Admitting: Family Medicine

## 2015-07-02 VITALS — BP 144/88 | HR 84 | Temp 98.3°F | Ht 65.0 in | Wt 182.0 lb

## 2015-07-02 DIAGNOSIS — J069 Acute upper respiratory infection, unspecified: Secondary | ICD-10-CM | POA: Diagnosis not present

## 2015-07-02 DIAGNOSIS — B9789 Other viral agents as the cause of diseases classified elsewhere: Principal | ICD-10-CM

## 2015-07-02 MED ORDER — AMOXICILLIN-POT CLAVULANATE 875-125 MG PO TABS: 1.0000 | ORAL_TABLET | Freq: Two times a day (BID) | ORAL | Status: DC

## 2015-07-02 MED ORDER — BENZONATATE 200 MG PO CAPS: 200.0000 mg | ORAL_CAPSULE | Freq: Three times a day (TID) | ORAL | Status: DC | PRN

## 2015-07-02 NOTE — Progress Notes (Signed)
Pre visit review using our clinic review tool, if applicable. No additional management support is needed unless otherwise documented below in the visit note. 

## 2015-07-02 NOTE — Assessment & Plan Note (Signed)
Reassuring exam  Given augmentin to fill if symptoms worsen   Drink fluids and rest  Try tessalon for cough along with mucinex  If worse- fill the px for augmentin  Update if not starting to improve in a week or if worsening

## 2015-07-02 NOTE — Patient Instructions (Signed)
Drink fluids and rest  Try tessalon for cough along with mucinex  If worse- fill the px for augmentin  Update if not starting to improve in a week or if worsening

## 2015-07-02 NOTE — Progress Notes (Signed)
Subjective:    Patient ID: Wendy Kerr, female    DOB: 1930/10/19, 82 y.o.   MRN: [id number]  HPI Here with uri symptoms   Has had a cold and cough - cannot get rid of it  Off and on for over a week   Cough is prod - white mucous  No hx of pulm dz  No sob with cough spell only  No wheeze  Some nasal symptoms - stuffy and runny  ST on Monday - that is better  No fever   otc Robitussin and then mucinex  Loosened phlegm   Patient Active Problem List   Diagnosis Date Noted  . Routine general medical examination at a health care facility 02/11/2015  . Vitamin D deficiency 11/30/2014  . Anemia, iron deficiency 11/30/2014  . Atrial flutter ([place]) 06/28/2014  . Right bundle branch block 06/22/2014  . CKD stage 3 due to type 2 diabetes mellitus ([place]) 06/21/2014  . OSA (obstructive sleep apnea)   . GERD (gastroesophageal reflux disease) 01/01/2014  . Chronic diastolic heart failure ([place]) 01/21/2013    Class: Acute  . BEN NEOPLASM PANCREAS EXCEPT ISLETS LANGERHANS 08/16/2007  . Diabetes mellitus type 2 in obese ([place]) 02/10/2007  . HYPERCHOLESTEROLEMIA 02/10/2007  . MONOCLONAL GAMMOPATHY 02/10/2007  . Obesity 02/10/2007  . Essential hypertension 02/10/2007  . ANXIETY DISORDER, GENERALIZED 12/07/2006  . DEGENERATIVE JOINT DISEASE 12/07/2006   Past Medical History  Diagnosis Date  . Bronchitis, mucopurulent recurrent ([place])   . Hypertension   . Diabetes mellitus ([place])   . Hypercholesterolemia   . Obesity   . Diverticulosis of colon   . Benign neoplasm of pancreas, except islets of Langerhans   . DJD (degenerative joint disease)   . Vitamin D deficiency   . Common migraine   . Generalized anxiety disorder   . Anemia   . Monoclonal gammopathy   . OSA (obstructive sleep apnea)     severe with AHI 37 events per hour   Past Surgical History  Procedure Laterality Date  . Resection serous cystadenoma of pancreas  2004    at [facility]  . Abdominal hysterectomy     Social  History  Substance Use Topics  . Smoking status: Never Smoker   . Smokeless tobacco: Never Used  . Alcohol Use: No   Family History  Problem Relation Age of Onset  . Hypertension Mother   . Kidney failure Mother   . Hypertension Father   . Kidney failure Father    Allergies  Allergen Reactions  . Piroxicam Other (See Comments)    REACTION: HEADACHE   Current Outpatient Prescriptions on File Prior to Visit  Medication Sig Dispense Refill  . apixaban (ELIQUIS) 2.5 MG TABS tablet Take 1 tablet (2.5 mg total) by mouth 2 (two) times daily. 60 tablet 11  . digoxin (LANOXIN) 0.125 MG tablet TAKE 0.5 TABLETS (0.0625 MG TOTAL) BY MOUTH DAILY. 15 tablet 5  . diltiazem (CARDIZEM CD) 240 MG 24 hr capsule Take 1 capsule (240 mg total) by mouth daily. 60 capsule 5  . diltiazem (TIAZAC) 240 MG 24 hr capsule TAKE 1 CAPSULE (240 MG TOTAL) BY MOUTH DAILY. 90 capsule 3  . furosemide (LASIX) 80 MG tablet Take 1 tablet (80 mg total) by mouth 2 (two) times daily. 60 tablet 9  . hydrALAZINE (APRESOLINE) 25 MG tablet TAKE 1 TABLET BY MOUTH 3 TIMES DAILY 90 tablet 3  . isosorbide mononitrate (IMDUR) 60 MG 24 hr tablet TAKE 1 TABLET (60 MG  TOTAL) BY MOUTH EVERY MORNING. 30 tablet 8  . Multiple Vitamin (MULTIVITAMIN WITH MINERALS) TABS tablet Take 1 tablet by mouth every morning.    . nitroGLYCERIN (NITROSTAT) 0.4 MG SL tablet Place 1 tablet (0.4 mg total) under the tongue every 5 (five) minutes as needed for chest pain. 5 tablet 3  . OVER THE COUNTER MEDICATION Take 1 tablet by mouth daily. MED NAME: IRON    . simvastatin (ZOCOR) 40 MG tablet TAKE 1 TABLET (40 MG TOTAL) BY MOUTH AT BEDTIME. 30 tablet 3  . Vitamin D, Ergocalciferol, (DRISDOL) 50000 UNITS CAPS capsule Take 50,000 Units by mouth every 7 (seven) days. Reported on 02/11/2015  7  . Vitamin D, Ergocalciferol, (DRISDOL) 50000 UNITS CAPS capsule TAKE ONE CAPSULE BY MOUTH ONCE A WEEK 4 capsule 4   No current facility-administered medications on file  prior to visit.     Review of Systems  Constitutional: Positive for appetite change and fatigue. Negative for fever.  HENT: Positive for congestion, postnasal drip, rhinorrhea, sinus pressure, sneezing and sore throat. Negative for ear pain.   Eyes: Negative for pain and discharge.  Respiratory: Positive for cough. Negative for shortness of breath, wheezing and stridor.   Cardiovascular: Negative for chest pain.  Gastrointestinal: Negative for nausea, vomiting and diarrhea.  Genitourinary: Negative for urgency, frequency and hematuria.  Musculoskeletal: Negative for myalgias and arthralgias.  Skin: Negative for rash.  Neurological: Positive for headaches. Negative for dizziness, weakness and light-headedness.  Psychiatric/Behavioral: Negative for confusion and dysphoric mood.       Objective:   Physical Exam  Constitutional: She appears well-developed and well-nourished. No distress.  HENT:  Head: Normocephalic and atraumatic.  Right Ear: External ear normal.  Left Ear: External ear normal.  Mouth/Throat: Oropharynx is clear and moist.  Nares are injected and congested  No sinus tenderness Clear rhinorrhea and post nasal drip   Eyes: Conjunctivae and EOM are normal. Pupils are equal, round, and reactive to light. Right eye exhibits no discharge. Left eye exhibits no discharge.  Neck: Normal range of motion. Neck supple.  Cardiovascular: Normal rate and normal heart sounds.   Pulmonary/Chest: Effort normal and breath sounds normal. No respiratory distress. She has no wheezes. She has no rales. She exhibits no tenderness.  Lymphadenopathy:    She has no cervical adenopathy.  Neurological: She is alert.  Skin: Skin is warm and dry. No rash noted.  Psychiatric: She has a normal mood and affect.          Assessment & Plan:   Problem List Items Addressed This Visit      Respiratory   Viral URI with cough - Primary    Reassuring exam  Given augmentin to fill if symptoms  worsen   Drink fluids and rest  Try tessalon for cough along with mucinex  If worse- fill the px for augmentin  Update if not starting to improve in a week or if worsening

## 2015-07-16 ENCOUNTER — Other Ambulatory Visit: Payer: Self-pay | Admitting: Interventional Cardiology

## 2015-07-19 IMAGING — CR DG CHEST 2V
2 series · 2 of 2 positions shown · non-contrast
Comparison: 01/03/2013

CLINICAL DATA: Hypertension, umbilical pain

EXAM:
CHEST  2 VIEW

[w chest pa]
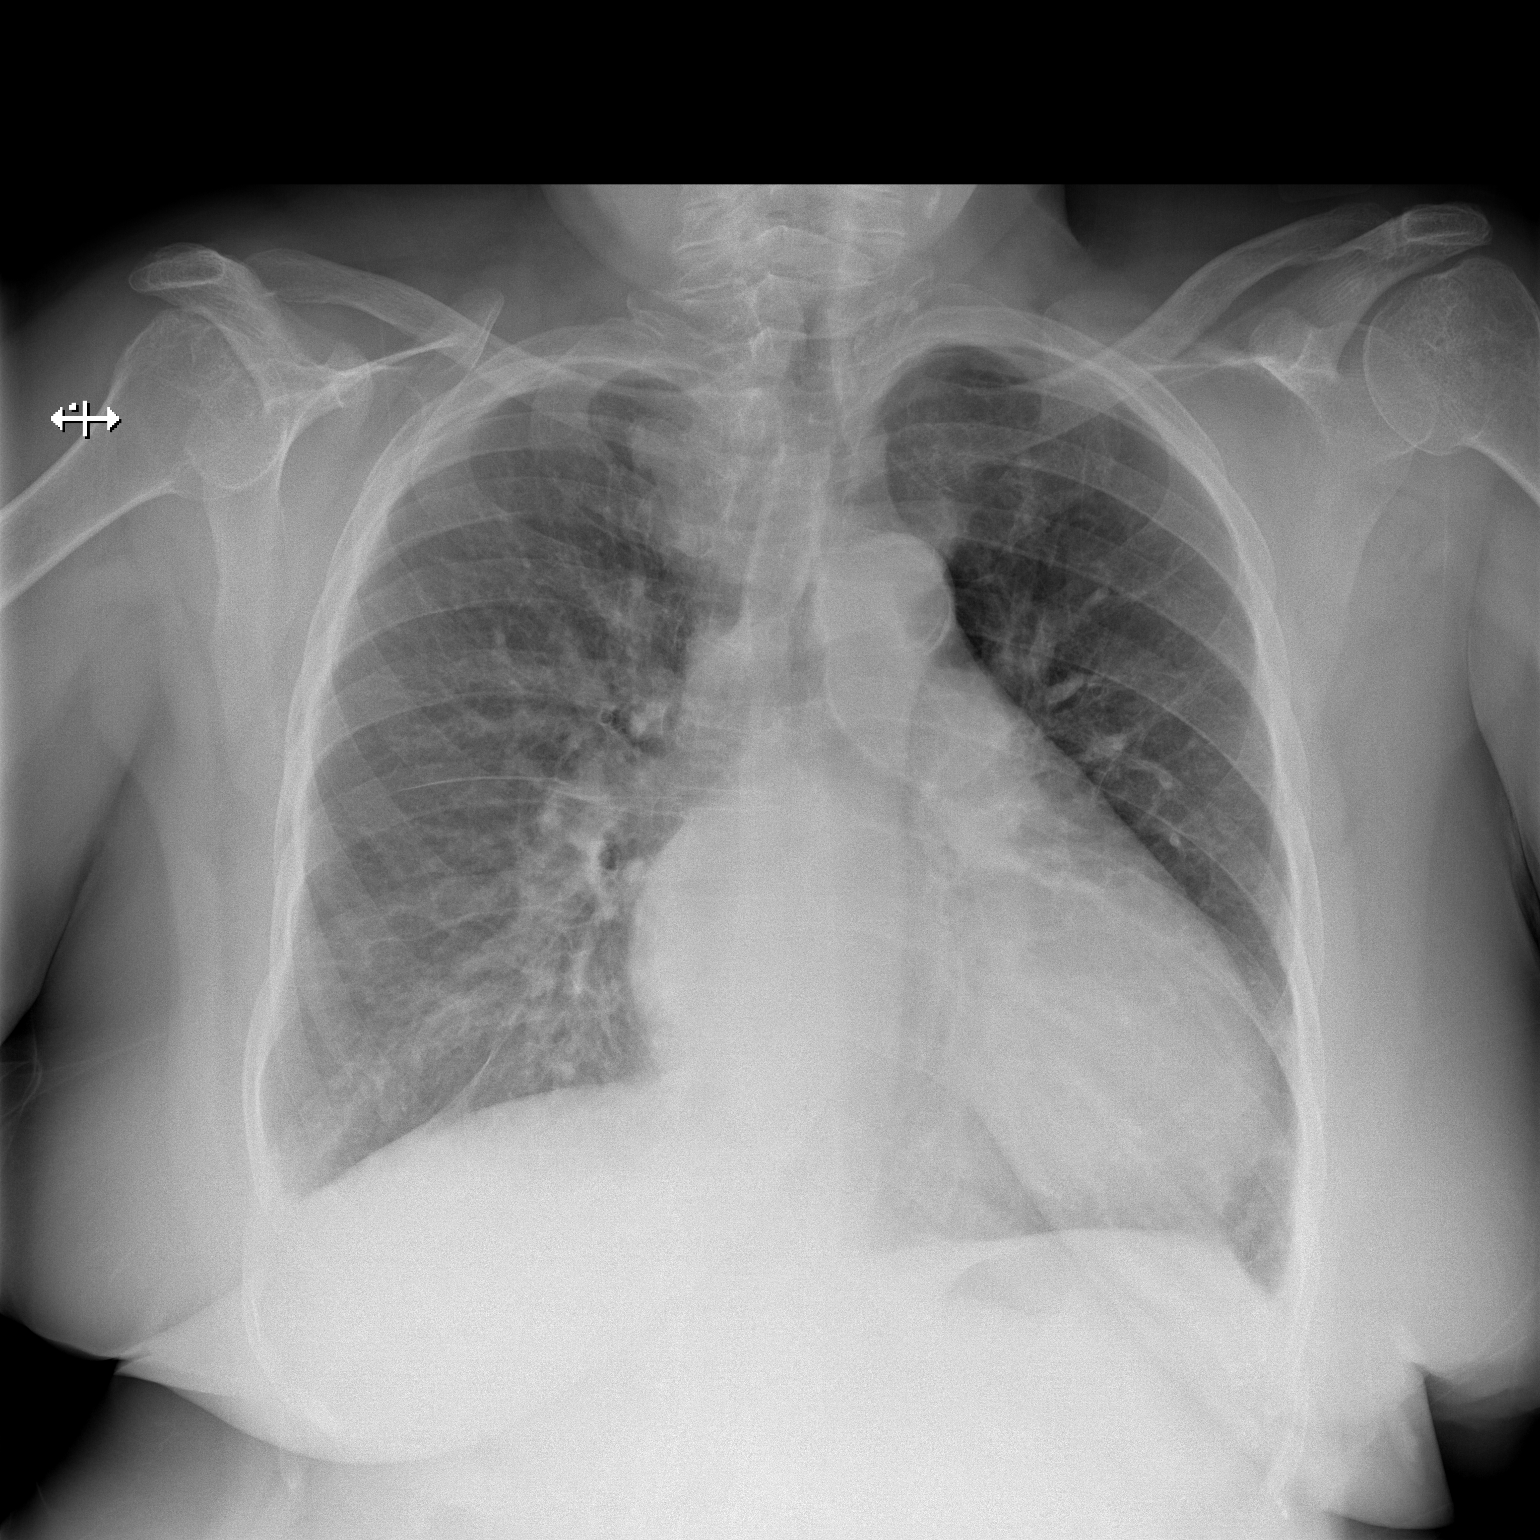

[w chest lat]
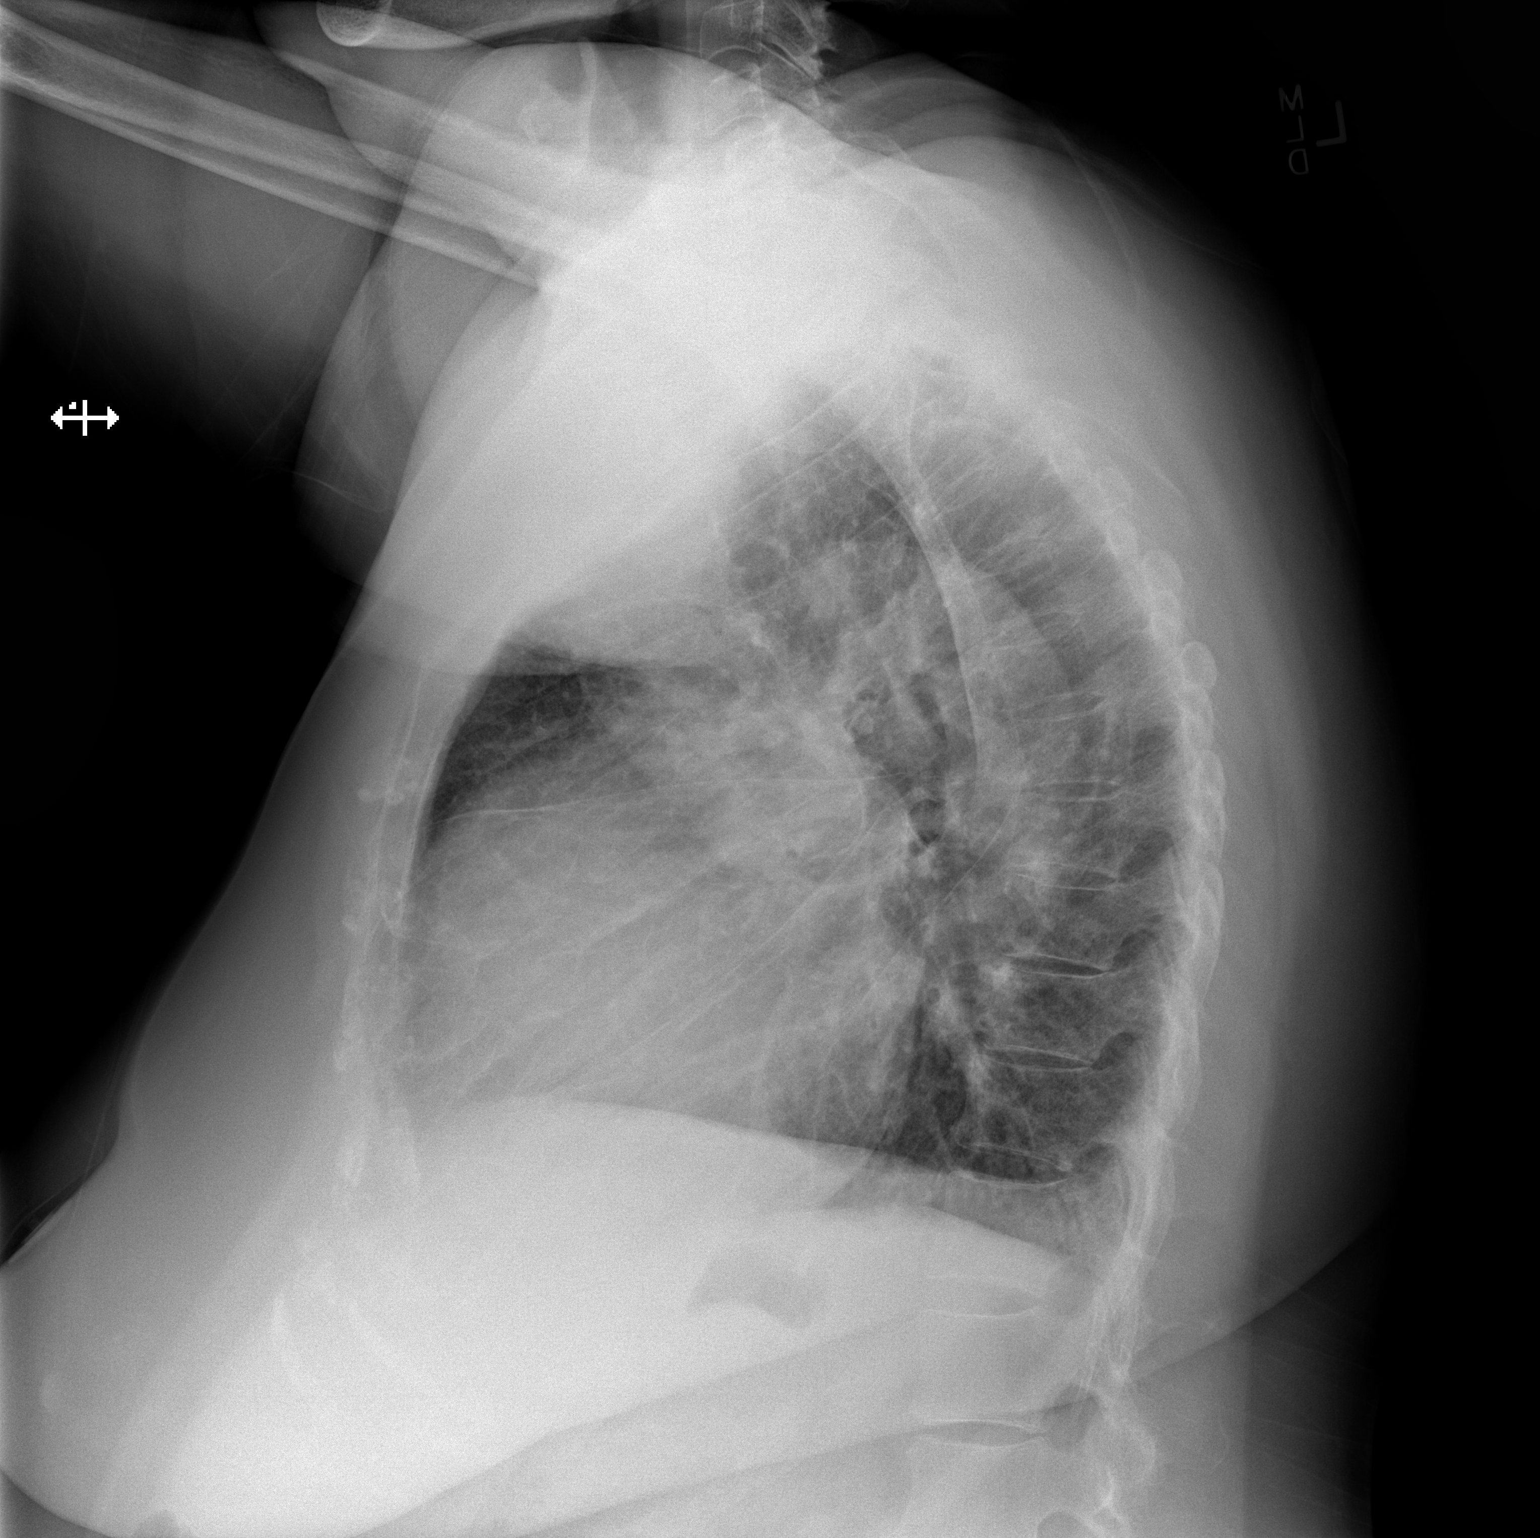

[2 of 2 positions shown; findings below may reference images not displayed]

FINDINGS: Cardiomegaly. Mild elevation of the right hemidiaphragm. Central
mild vascular congestion without convincing pulmonary edema. No
segmental infiltrate. Bony thorax is stable.
IMPRESSION: Cardiomegaly. Central vascular congestion without convincing
pulmonary edema. No focal infiltrate.

## 2015-07-21 ENCOUNTER — Encounter: Payer: Self-pay | Admitting: Family

## 2015-07-21 ENCOUNTER — Ambulatory Visit (INDEPENDENT_AMBULATORY_CARE_PROVIDER_SITE_OTHER): Payer: Medicare Other | Admitting: Family

## 2015-07-21 VITALS — BP 140/110 | HR 86 | Temp 97.9°F | Ht 65.0 in | Wt 181.8 lb

## 2015-07-21 DIAGNOSIS — R05 Cough: Secondary | ICD-10-CM

## 2015-07-21 DIAGNOSIS — R059 Cough, unspecified: Secondary | ICD-10-CM

## 2015-07-21 NOTE — Progress Notes (Signed)
Subjective:    Patient ID: Wendy Kerr, female    DOB: [birth year], 82 y.o.   MRN: [id number]   Wendy Kerr is a 82 y.o. female who presents today for an acute visit.    HPI Comments: Accompanied by son. H/o CHF. Monitors weight. No significant change per patient.   Per chart review, patient was seen 5/6 for cough and given Augmentin if symptoms worsen. She was also given Tessalon for cough. Took the augmentin with some relief.   Cough This is a new problem. The current episode started 1 to 4 weeks ago. The problem has been unchanged. The problem occurs every few hours. The cough is productive of sputum. Associated symptoms include shortness of breath (feels SOB during coughing attacks). Pertinent negatives include no chest pain, chills, ear pain, fever, headaches, myalgias, nasal congestion, rhinorrhea, sore throat or wheezing. The symptoms are aggravated by lying down. Treatments tried: tessalon. Her past medical history is significant for environmental allergies. There is no history of asthma, COPD or pneumonia.   Past Medical History  Diagnosis Date  . Bronchitis, mucopurulent recurrent ([place])   . Hypertension   . Diabetes mellitus ([place])   . Hypercholesterolemia   . Obesity   . Diverticulosis of colon   . Benign neoplasm of pancreas, except islets of Langerhans   . DJD (degenerative joint disease)   . Vitamin D deficiency   . Common migraine   . Generalized anxiety disorder   . Anemia   . Monoclonal gammopathy   . OSA (obstructive sleep apnea)     severe with AHI 37 events per hour   Allergies: Piroxicam Current Outpatient Prescriptions on File Prior to Visit  Medication Sig Dispense Refill  . digoxin (LANOXIN) 0.125 MG tablet TAKE 0.5 TABLETS (0.0625 MG TOTAL) BY MOUTH DAILY. 15 tablet 5  . diltiazem (CARDIZEM CD) 240 MG 24 hr capsule Take 1 capsule (240 mg total) by mouth daily. 60 capsule 5  . ELIQUIS 2.5 MG TABS tablet TAKE 1 TABLET BY MOUTH 2 TIMES DAILY. 60  tablet 8  . furosemide (LASIX) 80 MG tablet Take 1 tablet (80 mg total) by mouth 2 (two) times daily. 60 tablet 9  . hydrALAZINE (APRESOLINE) 25 MG tablet TAKE 1 TABLET BY MOUTH 3 TIMES DAILY 90 tablet 3  . isosorbide mononitrate (IMDUR) 60 MG 24 hr tablet TAKE 1 TABLET (60 MG TOTAL) BY MOUTH EVERY MORNING. 30 tablet 8  . Multiple Vitamin (MULTIVITAMIN WITH MINERALS) TABS tablet Take 1 tablet by mouth every morning.    . nitroGLYCERIN (NITROSTAT) 0.4 MG SL tablet Place 1 tablet (0.4 mg total) under the tongue every 5 (five) minutes as needed for chest pain. 5 tablet 3  . OVER THE COUNTER MEDICATION Take 1 tablet by mouth daily. MED NAME: IRON    . simvastatin (ZOCOR) 40 MG tablet TAKE 1 TABLET (40 MG TOTAL) BY MOUTH AT BEDTIME. 30 tablet 3  . Vitamin D, Ergocalciferol, (DRISDOL) 50000 UNITS CAPS capsule Take 50,000 Units by mouth every 7 (seven) days. Reported on 02/11/2015  7   No current facility-administered medications on file prior to visit.    Social History  Substance Use Topics  . Smoking status: Never Smoker   . Smokeless tobacco: Never Used  . Alcohol Use: No    Review of Systems  Constitutional: Negative for fever and chills.  HENT: Negative for congestion, ear pain, rhinorrhea, sinus pressure and sore throat.   Respiratory: Positive for cough and shortness of breath (  feels SOB during coughing attacks). Negative for wheezing.   Cardiovascular: Negative for chest pain and palpitations.  Gastrointestinal: Negative for nausea, vomiting and diarrhea.  Musculoskeletal: Negative for myalgias.  Allergic/Immunologic: Positive for environmental allergies.  Neurological: Negative for headaches.      Objective:    BP 140/110 mmHg  Pulse 86  Temp(Src) 97.9 F (36.6 C) (Oral)  Ht 5\' 5"  (1.651 m)  Wt 181 lb 12 oz (82.441 kg)  BMI 30.24 kg/m2  SpO2 91%   Physical Exam  Constitutional: She appears well-developed and well-nourished.  HENT:  Head: Normocephalic and atraumatic.    Right Ear: Hearing, tympanic membrane, external ear and ear canal normal. No drainage, swelling or tenderness. No foreign bodies. Tympanic membrane is not erythematous and not bulging. No middle ear effusion. No decreased hearing is noted.  Left Ear: Hearing, tympanic membrane, external ear and ear canal normal. No drainage, swelling or tenderness. No foreign bodies. Tympanic membrane is not erythematous and not bulging.  No middle ear effusion. No decreased hearing is noted.  Nose: Nose normal. No rhinorrhea. Right sinus exhibits no maxillary sinus tenderness and no frontal sinus tenderness. Left sinus exhibits no maxillary sinus tenderness and no frontal sinus tenderness.  Mouth/Throat: Uvula is midline, oropharynx is clear and moist and mucous membranes are normal. No oropharyngeal exudate, posterior oropharyngeal edema, posterior oropharyngeal erythema or tonsillar abscesses.  Eyes: Conjunctivae are normal.  Cardiovascular: Regular rhythm, normal heart sounds and normal pulses.   No murmur heard. Trace nonpitting lower extremity swelling noted over ankles and dorsal aspects of bilateral feet.  Pulmonary/Chest: Effort normal and breath sounds normal. She has no wheezes. She has no rhonchi. She has no rales.  Lymphadenopathy:       Head (right side): No submental, no submandibular, no tonsillar, no preauricular, no posterior auricular and no occipital adenopathy present.       Head (left side): No submental, no submandibular, no tonsillar, no preauricular, no posterior auricular and no occipital adenopathy present.    She has no cervical adenopathy.  Neurological: She is alert.  Skin: Skin is warm and dry.  Psychiatric: She has a normal mood and affect. Her speech is normal and behavior is normal. Thought content normal.  Vitals reviewed.      Assessment & Plan:   1. Cough Chest x-ray to evaluate for pneumonia or congestive heart failure exacerbation. SaO2 91%. No wheezing, shortness of  breath. She is well appearing.  -CXR   I have discontinued Ms. Menges's benzonatate and amoxicillin-clavulanate. I am also having her maintain her multivitamin with minerals, Vitamin D (Ergocalciferol), diltiazem, simvastatin, nitroGLYCERIN, furosemide, OVER THE COUNTER MEDICATION, isosorbide mononitrate, digoxin, hydrALAZINE, and ELIQUIS.   No orders of the defined types were placed in this encounter.     Start medications as prescribed and explained to patient on After Visit Summary ( AVS). Risks, benefits, and alternatives of the medications and treatment plan prescribed today were discussed, and patient expressed understanding.   Education regarding symptom management and diagnosis given to patient.   Follow-up:Plan follow-up and return precautions given if any worsening symptoms or change in condition.   Continue to follow with Hoyt Koch, MD for routine health maintenance.   Malachy Mood and I agreed with plan.   Mable Paris, FNP

## 2015-07-21 NOTE — Progress Notes (Signed)
Pre visit review using our clinic review tool, if applicable. No additional management support is needed unless otherwise documented below in the visit note. 

## 2015-07-21 NOTE — Patient Instructions (Addendum)
Chest x-ray to evaluate for pneumonia or congestive heart failure exacerbation. We'll call you with results.  If the chest x-ray is clear, as I discussed I suspect post viral cough which may linger for few more weeks.  If there is no improvement in your symptoms, or if there is any worsening of symptoms, or if you have any additional concerns, please return for re-evaluation; or, if we are closed, consider going to the Emergency Room for evaluation if symptoms urgent.  Cough, Adult Coughing is a reflex that clears your throat and your airways. Coughing helps to heal and protect your lungs. It is normal to cough occasionally, but a cough that happens with other symptoms or lasts a long time may be a sign of a condition that needs treatment. A cough may last only 2-3 weeks (acute), or it may last longer than 8 weeks (chronic). CAUSES Coughing is commonly caused by:  Breathing in substances that irritate your lungs.  A viral or bacterial respiratory infection.  Allergies.  Asthma.  Postnasal drip.  Smoking.  Acid backing up from the stomach into the esophagus (gastroesophageal reflux).  Certain medicines.  Chronic lung problems, including COPD (or rarely, lung cancer).  Other medical conditions such as heart failure. HOME CARE INSTRUCTIONS  Pay attention to any changes in your symptoms. Take these actions to help with your discomfort:  Take medicines only as told by your health care provider.  If you were prescribed an antibiotic medicine, take it as told by your health care provider. Do not stop taking the antibiotic even if you start to feel better.  Talk with your health care provider before you take a cough suppressant medicine.  Drink enough fluid to keep your urine clear or pale yellow.  If the air is dry, use a cold steam vaporizer or humidifier in your bedroom or your home to help loosen secretions.  Avoid anything that causes you to cough at work or at home.  If  your cough is worse at night, try sleeping in a semi-upright position.  Avoid cigarette smoke. If you smoke, quit smoking. If you need help quitting, ask your health care provider.  Avoid caffeine.  Avoid alcohol.  Rest as needed. SEEK MEDICAL CARE IF:   You have new symptoms.  You cough up pus.  Your cough does not get better after 2-3 weeks, or your cough gets worse.  You cannot control your cough with suppressant medicines and you are losing sleep.  You develop pain that is getting worse or pain that is not controlled with pain medicines.  You have a fever.  You have unexplained weight loss.  You have night sweats. SEEK IMMEDIATE MEDICAL CARE IF:  You cough up blood.  You have difficulty breathing.  Your heartbeat is very fast.   This information is not intended to replace advice given to you by your health care provider. Make sure you discuss any questions you have with your health care provider.   Document Released: 08/11/2010 Document Revised: 11/03/2014 Document Reviewed: 04/21/2014 Elsevier Interactive Patient Education [employer].

## 2015-07-22 ENCOUNTER — Ambulatory Visit (INDEPENDENT_AMBULATORY_CARE_PROVIDER_SITE_OTHER)
Admission: RE | Admit: 2015-07-22 | Discharge: 2015-07-22 | Disposition: A | Discharge status: Home / Self Care | Payer: Medicare Other | Source: Ambulatory Visit | Attending: Family | Admitting: Family

## 2015-07-22 ENCOUNTER — Other Ambulatory Visit: Payer: Self-pay | Admitting: Interventional Cardiology

## 2015-07-22 DIAGNOSIS — R05 Cough: Secondary | ICD-10-CM

## 2015-07-28 ENCOUNTER — Telehealth: Payer: Self-pay

## 2015-07-28 NOTE — Telephone Encounter (Signed)
Got a call about the chest xrays. I informed patient of all the notes. They understand and are aware. Thank you.

## 2015-07-29 NOTE — Telephone Encounter (Signed)
Noted in the imaging tab.

## 2015-08-04 ENCOUNTER — Ambulatory Visit (INDEPENDENT_AMBULATORY_CARE_PROVIDER_SITE_OTHER): Payer: Medicare Other | Admitting: Internal Medicine

## 2015-08-04 ENCOUNTER — Encounter: Payer: Self-pay | Admitting: Internal Medicine

## 2015-08-04 ENCOUNTER — Other Ambulatory Visit (INDEPENDENT_AMBULATORY_CARE_PROVIDER_SITE_OTHER): Payer: Medicare Other

## 2015-08-04 VITALS — BP 144/92 | HR 79 | Temp 98.5°F | Resp 18 | Ht 65.0 in | Wt 182.0 lb

## 2015-08-04 DIAGNOSIS — E559 Vitamin D deficiency, unspecified: Secondary | ICD-10-CM | POA: Diagnosis not present

## 2015-08-04 DIAGNOSIS — E669 Obesity, unspecified: Secondary | ICD-10-CM

## 2015-08-04 DIAGNOSIS — E119 Type 2 diabetes mellitus without complications: Secondary | ICD-10-CM | POA: Diagnosis not present

## 2015-08-04 DIAGNOSIS — N183 Chronic kidney disease, stage 3 unspecified: Secondary | ICD-10-CM

## 2015-08-04 DIAGNOSIS — E1122 Type 2 diabetes mellitus with diabetic chronic kidney disease: Secondary | ICD-10-CM | POA: Diagnosis not present

## 2015-08-04 DIAGNOSIS — E1169 Type 2 diabetes mellitus with other specified complication: Secondary | ICD-10-CM

## 2015-08-04 DIAGNOSIS — I10 Essential (primary) hypertension: Secondary | ICD-10-CM | POA: Diagnosis not present

## 2015-08-04 LAB — VITAMIN D 25 HYDROXY (VIT D DEFICIENCY, FRACTURES): VITD: 45.51 ng/mL (ref 30.00–100.00)

## 2015-08-04 LAB — COMPREHENSIVE METABOLIC PANEL
ALBUMIN: 4.4 g/dL (ref 3.5–5.2)
ALT: 17 U/L (ref 0–35)
AST: 21 U/L (ref 0–37)
Alkaline Phosphatase: 179 U/L — ABNORMAL HIGH (ref 39–117)
BUN: 54 mg/dL — AB (ref 6–23)
CHLORIDE: 102 meq/L (ref 96–112)
CO2: 23 mEq/L (ref 19–32)
Calcium: 9.4 mg/dL (ref 8.4–10.5)
Creatinine, Ser: 1.9 mg/dL — ABNORMAL HIGH (ref 0.40–1.20)
GFR: 32.46 mL/min — AB (ref >60.00)
Glucose, Bld: 144 mg/dL — ABNORMAL HIGH (ref 70–99)
POTASSIUM: 4.3 meq/L (ref 3.5–5.1)
SODIUM: 136 meq/L (ref 135–145)
Total Bilirubin: 0.6 mg/dL (ref 0.2–1.2)
Total Protein: 7.9 g/dL (ref 6.0–8.3)

## 2015-08-04 LAB — CBC
HEMATOCRIT: 37.2 % (ref 36.0–46.0)
HEMOGLOBIN: 12.2 g/dL (ref 12.0–15.0)
MCHC: 32.9 g/dL (ref 30.0–36.0)
MCV: 83.1 fl (ref 78.0–100.0)
PLATELETS: 380 10*3/uL (ref 150.0–400.0)
RBC: 4.48 Mil/uL (ref 3.87–5.11)
RDW: 16.1 % — AB (ref 11.5–15.5)
WBC: 6.3 10*3/uL (ref 4.0–10.5)

## 2015-08-04 LAB — HEMOGLOBIN A1C: HEMOGLOBIN A1C: 6.6 % — AB (ref 4.6–6.5)

## 2015-08-04 NOTE — Assessment & Plan Note (Signed)
Refer to nephrology as her stage 3 is bordering on stage 4 for some time now.

## 2015-08-04 NOTE — Assessment & Plan Note (Signed)
BP borderline with lasix, diltiazem, imdur. Refer to nephrology for this as well as her CKD stage 3 bordering on stage 4.

## 2015-08-04 NOTE — Progress Notes (Signed)
Pre visit review using our clinic review tool, if applicable. No additional management support is needed unless otherwise documented below in the visit note. 

## 2015-08-04 NOTE — Progress Notes (Signed)
   Subjective:    Patient ID: Wendy Kerr, female    DOB: 08-13-1930, 82 y.o.   MRN: [id number]  HPI The patient is an [name] coming in for follow up of her sugars (she was taken off metformin due to kidney function decline with controlled HgA1c 6.4 last, diet about the same and no high or low sugars since then, not exercising lately), and her blood pressure (running borderline lately at home with diastolic around 90, taking imdur, diltiazem, lasix, hydralazine, complicated by CKD stage 3 bordering on stage 4).   Review of Systems  Constitutional: Negative for fever, activity change, appetite change, fatigue and unexpected weight change.  HENT: Negative.   Respiratory: Positive for cough. Negative for chest tightness, shortness of breath and wheezing.        Improving  Cardiovascular: Negative for chest pain and palpitations.  Gastrointestinal: Negative for abdominal pain, constipation and abdominal distention.  Genitourinary: Negative.   Musculoskeletal: Negative.   Skin: Negative.   Neurological: Negative.   Psychiatric/Behavioral: Negative.       Objective:   Physical Exam  Constitutional: She is oriented to person, place, and time. She appears well-developed and well-nourished.  overweight  HENT:  Head: Normocephalic and atraumatic.  Eyes: EOM are normal.  Neck: Normal range of motion. No JVD present.  Cardiovascular: Normal rate and regular rhythm.   Pulmonary/Chest: Effort normal and breath sounds normal. No respiratory distress. She has no wheezes. She has no rales.  Abdominal: Soft. She exhibits no distension. There is no tenderness. There is no rebound.  Musculoskeletal: She exhibits no edema.  Neurological: She is alert and oriented to person, place, and time.  Skin: Skin is warm and dry.   Filed Vitals:   08/04/15 0840 08/04/15 0902  BP: 150/90 144/92  Pulse: 79   Temp: 98.5 F (36.9 C)   TempSrc: Oral   Resp: 18   Height: 5\' 5"  (1.651 m)   Weight:  182 lb (82.555 kg)   SpO2: 98%       Assessment & Plan:

## 2015-08-04 NOTE — Assessment & Plan Note (Signed)
On diet control now, foot exam done today. Checking HgA1c today and adjust as needed.

## 2015-08-04 NOTE — Patient Instructions (Signed)
We will check the blood work today and call you back about the results.   We likely will have you talk to a kidney doctor to make sure we are doing everything we can for the kidneys.

## 2015-08-04 NOTE — Assessment & Plan Note (Signed)
Checking vitamin D level since she is now off her high dose vitamin D.

## 2015-08-06 ENCOUNTER — Other Ambulatory Visit: Payer: Self-pay | Admitting: Internal Medicine

## 2015-08-15 ENCOUNTER — Telehealth: Payer: Self-pay

## 2015-08-15 ENCOUNTER — Other Ambulatory Visit (INDEPENDENT_AMBULATORY_CARE_PROVIDER_SITE_OTHER): Payer: Medicare Other

## 2015-08-15 DIAGNOSIS — R300 Dysuria: Secondary | ICD-10-CM

## 2015-08-15 LAB — URINALYSIS, ROUTINE W REFLEX MICROSCOPIC
SPECIFIC GRAVITY, URINE: 1.02 (ref 1.000–1.030)
pH: 6 (ref 5.0–8.0)

## 2015-08-15 NOTE — Telephone Encounter (Signed)
Notified pt MD ok lab order has been place in system...Wendy Kerr

## 2015-08-15 NOTE — Telephone Encounter (Signed)
Patient state she believe she has a UTI. She would like to just come to the lab and bring her urine to be checked. We are out of app today. Please advise or follow up. Thank you.

## 2015-08-15 NOTE — Telephone Encounter (Signed)
Order placed

## 2015-08-16 ENCOUNTER — Telehealth: Payer: Self-pay

## 2015-08-16 MED ORDER — NITROFURANTOIN MONOHYD MACRO 100 MG PO CAPS: 100.0000 mg | ORAL_CAPSULE | Freq: Two times a day (BID) | ORAL | Status: DC

## 2015-08-16 NOTE — Telephone Encounter (Signed)
She has a lot of blood in the urine sample so I would recommend for her to hold eliquis while taking her antibiotic. We have sent in macrobid for the infection. Take 1 pill twice daily for 1 week.

## 2015-08-16 NOTE — Telephone Encounter (Signed)
Patients son called and said he needs to the results for the labs yesterdays ASAP. His mother needs something now he states or he going to have to take her to the ER. States she is in a lot of pain from the UTI. Can you please follow up.

## 2015-08-16 NOTE — Telephone Encounter (Signed)
Patient aware and will stop eliquis and go pick up the macrobid.

## 2015-08-16 NOTE — Addendum Note (Signed)
Addended by: Pricilla Holm A on: 08/16/2015 08:59 AM   Modules accepted: Orders

## 2015-08-26 ENCOUNTER — Other Ambulatory Visit: Payer: Medicare Other

## 2015-08-26 ENCOUNTER — Telehealth: Payer: Self-pay | Admitting: Interventional Cardiology

## 2015-08-26 ENCOUNTER — Telehealth: Payer: Self-pay

## 2015-08-26 DIAGNOSIS — R300 Dysuria: Secondary | ICD-10-CM | POA: Diagnosis not present

## 2015-08-26 NOTE — Telephone Encounter (Signed)
New Message:   A week ago last Sunday,she started urinating every 15 to 20 minutes. On that Monday she went to her primary doctor,urine test showed she had a little blood in the urine.A prescriptiion was called in(he did know the name of it). Pt is on Eliquis 60 mg,wonder if that might be causing bleeding. Does he think her dose being reduced would help? Please call,he says he would love to talk to you today.

## 2015-08-26 NOTE — Telephone Encounter (Signed)
Returned pt's son Randy's call. Pt was recently treated for a UTI by her pcp. Prior to being started on an antibiotic macrobid, pt was experiencing burning and a little blood during urination. The blood in the urine resolved after a couple doses of the antibiotic. Pt has complete her antibiotic course, but still has frequent urination and some burning. They will f/u with her pcp.  [name] also reports that the pt's weight have been stable  173-174lbs. [name] sts that he just wanted to call to give an update.. Pt has an appt scheduled with Dr.Smith on 7/31. Nile Dear I will fwd Dr.Smith the FYI. He voiced appreciation.

## 2015-08-26 NOTE — Telephone Encounter (Signed)
Patient aware and will bring patient in to the lab to leave a sample.

## 2015-08-26 NOTE — Telephone Encounter (Signed)
Patient son called. And said he thinks his mother uti is not all the way better. And he would like a few more days worth of abx. Can that be done? She is still having some burning.. Please advise or follow up.

## 2015-08-26 NOTE — Telephone Encounter (Signed)
Would recommend repeat collection. Order entered.

## 2015-08-27 LAB — URINALYSIS W MICROSCOPIC + REFLEX CULTURE
BILIRUBIN URINE: NEGATIVE
CRYSTALS: NONE SEEN [HPF]
Casts: NONE SEEN [LPF]
Glucose, UA: NEGATIVE
Nitrite: POSITIVE — AB
SPECIFIC GRAVITY, URINE: 1.014 (ref 1.001–1.035)
Yeast: NONE SEEN [HPF]
pH: 5.5 (ref 5.0–8.0)

## 2015-08-29 ENCOUNTER — Other Ambulatory Visit: Payer: Self-pay | Admitting: Internal Medicine

## 2015-08-29 LAB — URINE CULTURE: Colony Count: >=100000

## 2015-08-29 MED ORDER — SULFAMETHOXAZOLE-TRIMETHOPRIM 800-160 MG PO TABS: 1.0000 | ORAL_TABLET | Freq: Two times a day (BID) | ORAL | Status: DC

## 2015-09-05 ENCOUNTER — Telehealth: Payer: Self-pay | Admitting: Internal Medicine

## 2015-09-05 NOTE — Telephone Encounter (Signed)
Patients son called in to advise that sulfamethoxazole-trimethoprim (BACTRIM DS,SEPTRA DS) 800-160 MG tablet [id number] is causing nausea. He is asking if something else can be called in its place. Pharmacy is [facility] on [address]

## 2015-09-06 NOTE — Telephone Encounter (Signed)
Agree. thanks

## 2015-09-06 NOTE — Telephone Encounter (Signed)
1 week supply prescribed on 08/29/15, is she done with the course or how long has she been taking this medicine? This would affect if she could just stop or if we need to change.

## 2015-09-06 NOTE — Telephone Encounter (Signed)
Patient only has one pill left. She will complete and call us if she is starts to feel bad again.

## 2015-09-08 DIAGNOSIS — N183 Chronic kidney disease, stage 3 (moderate): Secondary | ICD-10-CM | POA: Diagnosis not present

## 2015-09-08 DIAGNOSIS — N390 Urinary tract infection, site not specified: Secondary | ICD-10-CM | POA: Diagnosis not present

## 2015-09-08 DIAGNOSIS — D509 Iron deficiency anemia, unspecified: Secondary | ICD-10-CM | POA: Diagnosis not present

## 2015-09-08 DIAGNOSIS — I129 Hypertensive chronic kidney disease with stage 1 through stage 4 chronic kidney disease, or unspecified chronic kidney disease: Secondary | ICD-10-CM | POA: Diagnosis not present

## 2015-09-08 DIAGNOSIS — E119 Type 2 diabetes mellitus without complications: Secondary | ICD-10-CM | POA: Diagnosis not present

## 2015-09-10 ENCOUNTER — Other Ambulatory Visit: Payer: Self-pay | Admitting: Nephrology

## 2015-09-10 DIAGNOSIS — N183 Chronic kidney disease, stage 3 unspecified: Secondary | ICD-10-CM

## 2015-09-13 ENCOUNTER — Ambulatory Visit
Admission: RE | Admit: 2015-09-13 | Discharge: 2015-09-13 | Disposition: A | Discharge status: Home / Self Care | Payer: Medicare Other | Source: Ambulatory Visit | Attending: Nephrology | Admitting: Nephrology

## 2015-09-13 DIAGNOSIS — N183 Chronic kidney disease, stage 3 unspecified: Secondary | ICD-10-CM

## 2015-09-13 DIAGNOSIS — N189 Chronic kidney disease, unspecified: Secondary | ICD-10-CM | POA: Diagnosis not present

## 2015-09-15 ENCOUNTER — Encounter: Payer: Self-pay | Admitting: *Deleted

## 2015-09-15 ENCOUNTER — Encounter: Payer: Self-pay | Admitting: Cardiology

## 2015-09-23 DIAGNOSIS — N183 Chronic kidney disease, stage 3 (moderate): Secondary | ICD-10-CM | POA: Diagnosis not present

## 2015-09-26 ENCOUNTER — Encounter: Payer: Self-pay | Admitting: Interventional Cardiology

## 2015-09-26 ENCOUNTER — Ambulatory Visit (INDEPENDENT_AMBULATORY_CARE_PROVIDER_SITE_OTHER): Payer: Medicare Other | Admitting: Interventional Cardiology

## 2015-09-26 VITALS — BP 124/62 | HR 74 | Ht 65.0 in | Wt 180.0 lb

## 2015-09-26 DIAGNOSIS — N183 Chronic kidney disease, stage 3 (moderate): Secondary | ICD-10-CM

## 2015-09-26 DIAGNOSIS — G4733 Obstructive sleep apnea (adult) (pediatric): Secondary | ICD-10-CM

## 2015-09-26 DIAGNOSIS — I4510 Unspecified right bundle-branch block: Secondary | ICD-10-CM | POA: Diagnosis not present

## 2015-09-26 DIAGNOSIS — I484 Atypical atrial flutter: Secondary | ICD-10-CM | POA: Diagnosis not present

## 2015-09-26 DIAGNOSIS — I10 Essential (primary) hypertension: Secondary | ICD-10-CM

## 2015-09-26 DIAGNOSIS — E1122 Type 2 diabetes mellitus with diabetic chronic kidney disease: Secondary | ICD-10-CM

## 2015-09-26 DIAGNOSIS — I5032 Chronic diastolic (congestive) heart failure: Secondary | ICD-10-CM | POA: Diagnosis not present

## 2015-09-26 MED ORDER — DIGOXIN 125 MCG PO TABS: 0.0625 mg | ORAL_TABLET | ORAL | 5 refills | Status: DC

## 2015-09-26 NOTE — Progress Notes (Signed)
Cardiology Office Note    Date:  09/26/2015   ID:  Wendy Kerr, Wendy Kerr December 28, 1930, MRN [id number]  PCP:  Wendy Koch, MD  Cardiologist: Wendy Grooms, MD   Chief Complaint  Patient presents with  . Congestive Heart Failure    History of Present Illness:  Wendy Kerr is a 82 y.o. female follow-up for chronic diastolic heart failure, hypertension, presumed coronary artery disease, sleep apnea, and moderate chronic kidney disease.  Not seen in Wendy Kerr because of chronic kidney disease. She has noticed some lower extremity swelling. Slight increasing shortness of breath. Recently had difficulty with urinary retention. Denies chest pain. Occasionally has palpitations in the mornings when she awakens.  Past Medical History:  Diagnosis Date  . Anemia   . Benign neoplasm of pancreas, except islets of Langerhans   . Bronchitis, mucopurulent recurrent (Wendy Kerr)   . Common migraine   . Diabetes mellitus ([place])   . Diverticulosis of colon   . DJD (degenerative joint disease)   . Generalized anxiety disorder   . Hypercholesterolemia   . Hypertension   . Monoclonal gammopathy   . Obesity   . OSA (obstructive sleep apnea)    severe with AHI 37 events per hour  . Vitamin D deficiency     Past Surgical History:  Procedure Laterality Date  . ABDOMINAL HYSTERECTOMY    . resection serous cystadenoma of pancreas  2004   at [facility]    Current Medications: Outpatient Medications Prior to Visit  Medication Sig Dispense Refill  . digoxin (LANOXIN) 0.125 MG tablet TAKE 0.5 TABLETS (0.0625 MG TOTAL) BY MOUTH DAILY. 15 tablet 5  . diltiazem (CARDIZEM CD) 240 MG 24 hr capsule Take 1 capsule (240 mg total) by mouth daily. 60 capsule 5  . ELIQUIS 2.5 MG TABS tablet TAKE 1 TABLET BY MOUTH 2 TIMES DAILY. 60 tablet 8  . furosemide (LASIX) 80 MG tablet TAKE 1 TABLET BY MOUTH TWICE A DAY 60 tablet 1  . hydrALAZINE (APRESOLINE) 25 MG tablet TAKE 1 TABLET BY MOUTH 3 TIMES DAILY  90 tablet 3  . isosorbide mononitrate (IMDUR) 60 MG 24 hr tablet TAKE 1 TABLET (60 MG TOTAL) BY MOUTH EVERY MORNING. 30 tablet 8  . Multiple Vitamin (MULTIVITAMIN WITH MINERALS) TABS tablet Take 1 tablet by mouth every morning.    . nitroGLYCERIN (NITROSTAT) 0.4 MG SL tablet Place 1 tablet (0.4 mg total) under the tongue every 5 (five) minutes as needed for chest pain. 5 tablet 3  . OVER THE COUNTER MEDICATION Take 1 tablet by mouth daily. MED NAME: IRON    . simvastatin (ZOCOR) 40 MG tablet TAKE 1 TABLET BY MOUTH AT BEDTIME 90 tablet 1  . sulfamethoxazole-trimethoprim (BACTRIM DS,SEPTRA DS) 800-160 MG tablet Take 1 tablet by mouth 2 (two) times daily. 14 tablet 0   No facility-administered medications prior to visit.      Allergies:   Piroxicam   Social History   Social History  . Marital status: Single    Spouse name: N/A  . Number of children: 2  . Years of education: N/A   Occupational History  . 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 Smoking status: Never Smoker  . Smokeless tobacco: Never Used  . Alcohol use No  . Drug use: No  . Sexual activity: Not on file   Other Topics Concern  . Not on file   Social  History Narrative   Retired Educational psychologist.  Single.  Wendy Kerr lives with her.  Ambulates independently.     Family History:  The patient's family history includes Heart Problems (age of onset: 51) in her mother; Heart Problems (age of onset: 19) in her father; Hypertension in her father and mother; Kidney disease in her other and sister; Kidney failure in her father and mother.   ROS:   Please see the history of present illness.    Recent UTI with burning dysuria. Referred to nephrologist Wendy Kerr. Also had hematuria.  All other systems reviewed and are negative.   PHYSICAL EXAM:   VS:  BP 124/62   Pulse 74   Ht 5\' 5"  (1.651 m)   Wt 180 lb (81.6 kg)   BMI 29.95 kg/m    GEN: Well nourished, well developed, in  no acute distress  HEENT: normal  Neck: no JVD, carotid bruits, or masses Cardiac: RRR; no murmurs, rubs, or gallops. There is 2+ bilateral lower extremity edema  Respiratory:  clear to auscultation bilaterally, normal work of breathing GI: soft, nontender, nondistended, + BS MS: no deformity or atrophy  Skin: warm and dry, no rash Neuro:  Alert and Oriented x 3, Strength and sensation are intact Psych: euthymic mood, full affect  Wt Readings from Last 3 Encounters:  09/26/15 180 lb (81.6 kg)  08/04/15 182 lb (82.6 kg)  07/21/15 181 lb 12 oz (82.4 kg)      Studies/Labs Reviewed:   EKG:  EKG  NSR with first-degree AV block, right bundle branch block, left anterior hemiblock. Since last tracing atrial flutter has resolved.  Recent Labs: 08/04/2015: ALT 17; BUN 54; Creatinine, Ser 1.90; Hemoglobin 12.2; Platelets 380.0; Potassium 4.3; Sodium 136   Lipid Panel    Component Value Date/Time   CHOL 185 07/30/2012 1052   TRIG 119.0 07/30/2012 1052   HDL 59.70 07/30/2012 1052   CHOLHDL 3 07/30/2012 1052   VLDL 23.8 07/30/2012 1052   LDLCALC 102 (H) 07/30/2012 1052   LDLDIRECT 142.5 08/11/2007 1021    Additional studies/ records that were reviewed today include:  Reviewed laboratory data from Wendy Kerr. Creatinine up to 2.68.    ASSESSMENT:    1. Chronic diastolic heart failure ([place])   2. Essential hypertension   3. Right bundle branch block   4. Atypical atrial flutter ([place])   5. OSA (obstructive sleep apnea)   6. CKD stage 3 due to type 2 diabetes mellitus ([place])      PLAN:  In order of problems listed above:  1. Decrease fluid intake. May need an increase in diuretic regimen. Will coordinate this with Wendy Kerr. 2. Excellent control. Salt and fluid restriction discussed. 3. No change. In normal sinus rhythm. 4. No change. In normal sinus rhythm with first-degree AV block. 5. Compliant with CP. Peripheral edema is likely related to increasing kidney  dysfunction. 6. Decrease Lanoxin to one half tablet Monday was in Friday instead of daily.  6 month follow-up.  Medication Adjustments/Labs and Tests Ordered: Current medicines are reviewed at length with the patient today.  Concerns regarding medicines are outlined above.  Medication changes, Labs and Tests ordered today are listed in the Patient Instructions below. There are no Patient Instructions on file for this visit.   Signed, Wendy Grooms, MD  09/26/2015 8:58 AM    [address], [place], [place]  [address] Phone: [phone]; Fax: [phone]

## 2015-09-26 NOTE — Patient Instructions (Signed)
Medication Instructions:  Your physician has recommended you make the following change in your medication:  REDUCE Digoxin to 0.0625mg  every Mon, Wed, Fri  Labwork: None ordered  Testing/Procedures: None ordered  Follow-Up: Your physician wants you to follow-up in: 6 months with Dr.Smith You will receive a reminder letter in the mail two months in advance. If you don't receive a letter, please call our office to schedule the follow-up appointment.   Any Other Special Instructions Will Be Listed Below (If Applicable).     If you need a refill on your cardiac medications before your next appointment, please call your pharmacy.

## 2015-09-28 ENCOUNTER — Other Ambulatory Visit: Payer: Self-pay | Admitting: Interventional Cardiology

## 2015-10-03 ENCOUNTER — Encounter: Payer: Self-pay | Admitting: Interventional Cardiology

## 2015-10-28 ENCOUNTER — Ambulatory Visit: Payer: Medicare Other | Admitting: Cardiology

## 2015-11-02 ENCOUNTER — Ambulatory Visit (INDEPENDENT_AMBULATORY_CARE_PROVIDER_SITE_OTHER): Payer: Medicare Other | Admitting: Internal Medicine

## 2015-11-02 ENCOUNTER — Encounter: Payer: Self-pay | Admitting: Internal Medicine

## 2015-11-02 VITALS — BP 132/80 | HR 86 | Temp 98.5°F | Resp 18 | Ht 65.0 in | Wt 190.0 lb

## 2015-11-02 DIAGNOSIS — Z23 Encounter for immunization: Secondary | ICD-10-CM | POA: Diagnosis not present

## 2015-11-02 DIAGNOSIS — R140 Abdominal distension (gaseous): Secondary | ICD-10-CM | POA: Diagnosis not present

## 2015-11-02 MED ORDER — DILTIAZEM HCL ER COATED BEADS 240 MG PO CP24: 240.0000 mg | ORAL_CAPSULE | Freq: Every day | ORAL | 5 refills | Status: DC

## 2015-11-02 NOTE — Patient Instructions (Signed)
We will get the scan of the stomach without the contrast.   We will call back with the results.   You can try beano over the counter to see if it helps and I have given you some information about diet that might help.

## 2015-11-02 NOTE — Progress Notes (Signed)
Pre visit review using our clinic review tool, if applicable. No additional management support is needed unless otherwise documented below in the visit note. 

## 2015-11-03 ENCOUNTER — Encounter: Payer: Self-pay | Admitting: Internal Medicine

## 2015-11-03 ENCOUNTER — Telehealth: Payer: Self-pay | Admitting: Geriatric Medicine

## 2015-11-03 DIAGNOSIS — R140 Abdominal distension (gaseous): Secondary | ICD-10-CM | POA: Insufficient documentation

## 2015-11-03 NOTE — Progress Notes (Signed)
   Subjective:    Patient ID: Wendy Kerr, female    DOB: Oct 15, 1930, 82 y.o.   MRN: [id number]  HPI The patient is an [name] coming in for 2 weeks of bloating and tightness in her stomach. It is not painful but uncomfortable. She denies diarrhea or constipation. Moving her bowels normally. Still taking her fluid pill and urinating normally. Legs are not swollen. She is up about 10 pounds in the last week. Denies change in diet or appetite. No nausea or vomiting. She is concerned as she had some problem with her pancreas in the past (serous cystadenoma pancreas resected in 2004 at [facility]). She has tried gas-x without relief over the counter.   Review of Systems  Constitutional: Positive for unexpected weight change. Negative for activity change, appetite change, fatigue and fever.  HENT: Negative.   Respiratory: Negative for cough, chest tightness, shortness of breath and wheezing.        Improving  Cardiovascular: Negative for chest pain and palpitations.  Gastrointestinal: Positive for abdominal distention. Negative for abdominal pain, blood in stool, constipation, diarrhea, nausea and vomiting.  Genitourinary: Negative.   Musculoskeletal: Negative.   Skin: Negative.   Neurological: Negative.   Psychiatric/Behavioral: Negative.       Objective:   Physical Exam  Constitutional: She is oriented to person, place, and time. She appears well-developed and well-nourished.  overweight  HENT:  Head: Normocephalic and atraumatic.  Eyes: EOM are normal.  Neck: Normal range of motion. No JVD present.  Cardiovascular: Normal rate and regular rhythm.   Pulmonary/Chest: Effort normal and breath sounds normal. No respiratory distress. She has no wheezes. She has no rales.  Abdominal: Soft. Bowel sounds are normal. She exhibits distension. She exhibits no mass. There is no tenderness. There is no rebound and no guarding.  Abdomen is distended and non-tender.   Musculoskeletal: She exhibits  no edema.  Neurological: She is alert and oriented to person, place, and time.  Skin: Skin is warm and dry.   Vitals:   11/02/15 1120  BP: 132/80  Pulse: 86  Resp: 18  Temp: 98.5 F (36.9 C)  TempSrc: Oral  SpO2: 97%  Weight: 190 lb (86.2 kg)  Height: 5\' 5"  (1.651 m)      Assessment & Plan:  Flu shot given at visit

## 2015-11-03 NOTE — Telephone Encounter (Signed)
CT Abdomen and pelvis w/o contrast needs to be changed to CT Abdomen and pelvis with contrast. Please change, thanks. It can't be scheduled until it is corrected.

## 2015-11-03 NOTE — Assessment & Plan Note (Addendum)
Concerning given that she has had neoplasm of the pancreas in the past and new bloating without cause. Checking CT abdomen and pelvis without contrast (given her poor kidney function). She will try beano as gas-x has not helped.

## 2015-11-03 NOTE — Telephone Encounter (Signed)
She cannot get contrast due to her kidney function so will not change.

## 2015-11-04 ENCOUNTER — Ambulatory Visit
Admission: RE | Admit: 2015-11-04 | Discharge: 2015-11-04 | Disposition: A | Discharge status: Home / Self Care | Payer: Medicare Other | Source: Ambulatory Visit | Attending: Internal Medicine | Admitting: Internal Medicine

## 2015-11-04 ENCOUNTER — Other Ambulatory Visit: Payer: Self-pay | Admitting: Internal Medicine

## 2015-11-04 DIAGNOSIS — R188 Other ascites: Secondary | ICD-10-CM

## 2015-11-04 DIAGNOSIS — K8689 Other specified diseases of pancreas: Secondary | ICD-10-CM | POA: Diagnosis not present

## 2015-11-04 DIAGNOSIS — R140 Abdominal distension (gaseous): Secondary | ICD-10-CM

## 2015-11-04 NOTE — Telephone Encounter (Signed)
Patient aware.

## 2015-11-04 NOTE — Telephone Encounter (Signed)
Son called in this morning requesting a call to be made to [facility] imaging so patient can get CAT scan done.  States patient is uncomfortable and trying to avoid ER.  Son name is Amea Walshe and phone number is [phone].

## 2015-11-07 ENCOUNTER — Telehealth: Payer: Self-pay | Admitting: Internal Medicine

## 2015-11-07 NOTE — Telephone Encounter (Signed)
Please call patient or son randy with CT results.

## 2015-11-08 NOTE — Telephone Encounter (Signed)
Jonelle Sidle spoke with son.

## 2015-11-09 ENCOUNTER — Ambulatory Visit (INDEPENDENT_AMBULATORY_CARE_PROVIDER_SITE_OTHER): Payer: Medicare Other | Admitting: Physician Assistant

## 2015-11-09 ENCOUNTER — Other Ambulatory Visit (INDEPENDENT_AMBULATORY_CARE_PROVIDER_SITE_OTHER): Payer: Medicare Other

## 2015-11-09 ENCOUNTER — Encounter: Payer: Self-pay | Admitting: Physician Assistant

## 2015-11-09 ENCOUNTER — Encounter (INDEPENDENT_AMBULATORY_CARE_PROVIDER_SITE_OTHER): Payer: Self-pay

## 2015-11-09 VITALS — BP 134/84 | HR 82 | Ht 65.0 in | Wt 190.0 lb

## 2015-11-09 DIAGNOSIS — I509 Heart failure, unspecified: Secondary | ICD-10-CM | POA: Diagnosis not present

## 2015-11-09 DIAGNOSIS — R188 Other ascites: Secondary | ICD-10-CM

## 2015-11-09 DIAGNOSIS — R140 Abdominal distension (gaseous): Secondary | ICD-10-CM | POA: Diagnosis not present

## 2015-11-09 DIAGNOSIS — R935 Abnormal findings on diagnostic imaging of other abdominal regions, including retroperitoneum: Secondary | ICD-10-CM

## 2015-11-09 DIAGNOSIS — N189 Chronic kidney disease, unspecified: Secondary | ICD-10-CM

## 2015-11-09 LAB — CBC WITH DIFFERENTIAL/PLATELET
BASOS ABS: 0 10*3/uL (ref 0.0–0.1)
BASOS PCT: 0.5 % (ref 0.0–3.0)
EOS ABS: 0 10*3/uL (ref 0.0–0.7)
Eosinophils Relative: 0.7 % (ref 0.0–5.0)
HEMATOCRIT: 38.5 % (ref 36.0–46.0)
HEMOGLOBIN: 12.7 g/dL (ref 12.0–15.0)
LYMPHS PCT: 17.4 % (ref 12.0–46.0)
Lymphs Abs: 1 10*3/uL (ref 0.7–4.0)
MCHC: 33.1 g/dL (ref 30.0–36.0)
MCV: 82.8 fl (ref 78.0–100.0)
MONO ABS: 0.7 10*3/uL (ref 0.1–1.0)
Monocytes Relative: 12.8 % — ABNORMAL HIGH (ref 3.0–12.0)
NEUTROS ABS: 4 10*3/uL (ref 1.4–7.7)
Neutrophils Relative %: 68.6 % (ref 43.0–77.0)
PLATELETS: 333 10*3/uL (ref 150.0–400.0)
RBC: 4.65 Mil/uL (ref 3.87–5.11)
RDW: 15.6 % — AB (ref 11.5–15.5)
WBC: 5.8 10*3/uL (ref 4.0–10.5)

## 2015-11-09 LAB — COMPREHENSIVE METABOLIC PANEL
ALBUMIN: 4.2 g/dL (ref 3.5–5.2)
ALK PHOS: 153 U/L — AB (ref 39–117)
ALT: 14 U/L (ref 0–35)
AST: 21 U/L (ref 0–37)
BUN: 40 mg/dL — AB (ref 6–23)
CO2: 31 mEq/L (ref 19–32)
Calcium: 9.2 mg/dL (ref 8.4–10.5)
Chloride: 98 mEq/L (ref 96–112)
Creatinine, Ser: 2.15 mg/dL — ABNORMAL HIGH (ref 0.40–1.20)
GFR: 28.13 mL/min — AB (ref >60.00)
Glucose, Bld: 122 mg/dL — ABNORMAL HIGH (ref 70–99)
POTASSIUM: 4 meq/L (ref 3.5–5.1)
Sodium: 138 mEq/L (ref 135–145)
TOTAL PROTEIN: 7.7 g/dL (ref 6.0–8.3)
Total Bilirubin: 0.7 mg/dL (ref 0.2–1.2)

## 2015-11-09 LAB — BRAIN NATRIURETIC PEPTIDE: PRO B NATRI PEPTIDE: 1559 pg/mL — AB (ref 0.0–100.0)

## 2015-11-09 NOTE — Patient Instructions (Signed)
Your physician has requested that you go to the basement for  lab work before leaving today.   

## 2015-11-09 NOTE — Progress Notes (Signed)
Let patient know that kidney function overall stable but not normal. Heart failure still a problem also  1) Needs diagnostic paracentesis - no more than 1 L off 2) Need to get clearance from cardiology re: holding Eliquis 2 d before paracentesis 3) Studies on paracentesis should be:   - cytology  - cell count and diff  - total protein  - amylase  - albumin  - culture  4) Needs to see her kidney specialist soon - I am ccing note but she should call about appt or we can call and try to get her in 40) I will let Dr. Tamala Julian know also (her cardiologist) needs to see him also re: ascites and heart failure

## 2015-11-09 NOTE — Progress Notes (Signed)
Agree with Ms. Wendy Kerr management.  BNP up but similar to past  Labs have returned and creatinine 2.15 so fairly stable.  Our sense that this is from heart failure.  Needs diagnostic paracentesis for more certainty and ordering that but will need to hold Eliquis.  Close to needing hospital perhaps.  Will cc: Drs. Hank Tamala Julian and Jamal Maes

## 2015-11-09 NOTE — Progress Notes (Signed)
Chief Complaint: Ascites and Bloating  HPI:  Wendy Kerr is an 82 y/o female, with past medical history significant for anemia, diabetes, diverticulosis, generalized anxiety disorder, history of serous cystadenoma pancreas resected in 2004 at [facility], CHF, chronic kidney disease and OSA, who was referred to me by Hoyt Koch, * for a complaint of ascites and bloating. Per chart review patient's recently seen by her primary care provider on 11/02/15 with a complaint of 2 weeks of bloating and tightness in her stomach. She had gained 10 pounds in the last week.   CT of the abdomen pelvis without contrast was ordered due to kidney function. This showed a large hypoattenuating lesion in the body of the pancreas which measured similar to dimensions reported from outside CT scan done at [facility] 01/29/14. This could not be definitively characterized that day and neoplasm remained a distinct consideration. Direct comparison earlier imaging studies would be helpful. 4.9 cm left adrenal mass measured larger than reported on the previous study from about 2 years ago. This did appear to have some macroscopic attenuation within the lesion and may represent a myelolipoma but metastatic disease or primary adrenal neoplasm could not be excluded. Imaging features were also suggestive of but not definitive to 4 cirrhosis. Moderate ascites. Diffuse body wall edema and abdominal aortic atherosclerosis. There has been no recent blood work done.  Today, the patient is accompanied by her son, who does assist with her history. She explains that towards the end of June she was placed on antibiotics for a UTI which were later change to "stronger antibiotics" as this did not help and her primary care provider noted a rise in her creatinine, she has been following with a nephrologist regarding this, most recently 4 weeks ago. Patient describes that her current symptoms started 3 weeks ago with a feeling of  bloating. She began taking Gas-X and laxatives, even though she was having regular bowel movements, this did not help her symptoms. She proceeded to her primary care physician who ordered a CT as above. Patient also notes a 10 pound weight gain in the past 3 weeks. She denies all other symptoms.  Patient's medical history is also positive for biventricular congestive heart failure for which she follows with Dr. Tamala Julian, last time she saw him was in July and at that time "things were going well", though the patient compares what she feels like now to her experience with acute exacerbation of her CHF last year when she was hospitalized. The patient is also maintained on Eliquis.  Currently patient denies any other symptoms including change in bowel habits, fever, chills, blood in her stool, melena, fatigue, anorexia, nausea, vomiting, heartburn, reflux, abdominal pain, shortness of breath, dyspnea on exertion, palpitations or chest pain, headaches, dizziness or syncope.   Past Medical History:  Diagnosis Date  . Anemia   . Benign neoplasm of pancreas, except islets of Langerhans   . Bronchitis, mucopurulent recurrent ([place])   . Common migraine   . Diabetes mellitus ([place])   . Diverticulosis of colon   . DJD (degenerative joint disease)   . Generalized anxiety disorder   . Hypercholesterolemia   . Hypertension   . Monoclonal gammopathy   . Obesity   . OSA (obstructive sleep apnea)    severe with AHI 37 events per hour  . Vitamin D deficiency     Past Surgical History:  Procedure Laterality Date  . ABDOMINAL HYSTERECTOMY    . resection serous cystadenoma of pancreas  2004   at [facility]    Current Outpatient Prescriptions  Medication Sig Dispense Refill  . digoxin (LANOXIN) 0.125 MG tablet Take 0.5 tablets (0.0625 mg total) by mouth every Monday, Wednesday, and Friday. 15 tablet 5  . diltiazem (CARDIZEM CD) 240 MG 24 hr capsule Take 1 capsule (240 mg total) by mouth daily. 60 capsule 5  .  ELIQUIS 2.5 MG TABS tablet TAKE 1 TABLET BY MOUTH 2 TIMES DAILY. 60 tablet 8  . furosemide (LASIX) 80 MG tablet TAKE 1 TABLET BY MOUTH TWICE A DAY 180 tablet 2  . hydrALAZINE (APRESOLINE) 25 MG tablet TAKE 1 TABLET BY MOUTH 3 TIMES DAILY 90 tablet 3  . isosorbide mononitrate (IMDUR) 60 MG 24 hr tablet TAKE 1 TABLET (60 MG TOTAL) BY MOUTH EVERY MORNING. 30 tablet 8  . Multiple Vitamin (MULTIVITAMIN WITH MINERALS) TABS tablet Take 1 tablet by mouth every morning.    . nitroGLYCERIN (NITROSTAT) 0.4 MG SL tablet Place 1 tablet (0.4 mg total) under the tongue every 5 (five) minutes as needed for chest pain. 5 tablet 3  . OVER THE COUNTER MEDICATION Take 1 tablet by mouth daily. MED NAME: IRON    . simvastatin (ZOCOR) 40 MG tablet TAKE 1 TABLET BY MOUTH AT BEDTIME 90 tablet 1   No current facility-administered medications for this visit.     Allergies as of 11/09/2015 - Review Complete 11/03/2015  Allergen Reaction Noted  . Piroxicam Other (See Comments)     Family History  Problem Relation Age of Onset  . Hypertension Mother   . Kidney failure Mother     dialysis  . Hypertension Father   . Kidney failure Father     dialysis  . Kidney disease Sister   . Kidney disease Other   . Heart Problems Mother 56  . Heart Problems Father 74    Social History   Social History  . Marital status: Single    Spouse name: N/A  . Number of children: 2  . Years of education: N/A   Occupational History  . 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 Smoking status: Never Smoker  . Smokeless tobacco: Never Used  . Alcohol use No  . Drug use: No  . Sexual activity: Not on file   Other Topics Concern  . 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  Ambulates independently.    Review of Systems:     Constitutional: Positive for a 10 pound weight gain in the past 3 aches No chills, weakness or  fatigue HEENT: Eyes: No change in vision               Ears, Nose, Throat:  No change in hearing or congestion Skin: No rash or itching Cardiovascular: No chest pain, chest pressure or palpitations   Respiratory: No SOB or cough Gastrointestinal: See HPI and otherwise negative Genitourinary: No dysuria or change in urinary frequency Neurological: No headache, dizziness or syncope Musculoskeletal: No new muscle or joint pain Hematologic: No bleeding or bruising Psychiatric: No history of depression or anxiety    Physical Exam:  Vital signs: .BP 134/84   Pulse 82   Ht 5\' 5"  (1.651 m)   Wt 190 lb 0.3 oz (86.2 kg)   BMI 31.62 kg/m  General:   Pleasant African-American female appears to be in NAD, Well developed, Well nourished, alert  and cooperative Head:  Normocephalic and atraumatic. Eyes:   PEERL, EOMI. No icterus. Conjunctiva pink. Ears:  Normal auditory acuity. Neck:  Supple Throat: Oral cavity and pharynx without inflammation, swelling or lesion.  Lungs: Respirations even and unlabored. Lungs clear to auscultation bilaterally.   No wheezes, crackles, or rhonchi.  Heart: Normal S1, S2. No MRG. Regular rate and rhythm. No peripheral edema, cyanosis or pallor.  Abdomen:  Soft, moderately distended, nontender, positive fluid wave, positive shifting dullness, abdominal wall edema No rebound or guarding. Normal bowel sounds. No appreciable masses or hepatomegaly. Rectal:  Not performed.  Msk:  Symmetrical without gross deformities.  Extremities:  Bilateral pitting edema up to the level of the patient's knee, no deformity or joint abnormality.  Neurologic:  Alert and  oriented x4;  grossly normal neurologically. Skin:   Dry and intact without significant lesions or rashes. Psychiatric: Oriented to person, place and time. Demonstrates good judgement and reason without abnormal affect or behaviors.  Most recent labs: CBC    Component Value Date/Time   WBC 6.3 08/04/2015 0904   RBC  4.48 08/04/2015 0904   HGB 12.2 08/04/2015 0904   HCT 37.2 08/04/2015 0904   PLT 380.0 08/04/2015 0904   MCV 83.1 08/04/2015 0904   MCH 26.2 06/24/2014 0511   MCHC 32.9 08/04/2015 0904   RDW 16.1 (H) 08/04/2015 0904   LYMPHSABS 1.1 06/28/2014 1024   MONOABS 0.9 06/28/2014 1024   EOSABS 0.2 06/28/2014 1024   BASOSABS 0.0 06/28/2014 1024    CMP     Component Value Date/Time   NA 136 08/04/2015 0904   K 4.3 08/04/2015 0904   CL 102 08/04/2015 0904   CO2 23 08/04/2015 0904   GLUCOSE 144 (H) 08/04/2015 0904   BUN 54 (H) 08/04/2015 0904   CREATININE 1.90 (H) 08/04/2015 0904   CALCIUM 9.4 08/04/2015 0904   PROT 7.9 08/04/2015 0904   ALBUMIN 4.4 08/04/2015 0904   AST 21 08/04/2015 0904   ALT 17 08/04/2015 0904   ALKPHOS 179 (H) 08/04/2015 0904   BILITOT 0.6 08/04/2015 0904   GFRNONAA 25 (L) 06/24/2014 0511   GFRAA 29 (L) 06/24/2014 0511   Imaging: CT abdomen and pelvis without contrast (11/04/15) CLINICAL DATA:  History of hypodense hypo enhancing mass at the junction of the head of pancreas. Outside report documents the lesion at 4.8 x 4.5 cm. Outside CT report from [facility] also documents a 3.9 cm left adrenal mass.  EXAM: CT ABDOMEN AND PELVIS WITHOUT CONTRAST  TECHNIQUE: Multidetector CT imaging of the abdomen and pelvis was performed following the standard protocol without IV contrast.  COMPARISON:  None.  FINDINGS: Lower chest: Small bilateral pleural effusions.  Hepatobiliary: No focal abnormality seen in the liver on this noncontrast exam. Liver contour appears subtly nodular and left hepatic lobe enlarged. Gallbladder unremarkable. No intrahepatic or extrahepatic biliary dilation.  Pancreas: 4.8 x 5.0 cm hypo attenuating mass is identified in the body the pancreas but difficult to measure without intravenous contrast in apparent fluid between the stomach and the pancreas.  Spleen: No splenomegaly. No focal mass  lesion.  Adrenals/Urinary Tract: Right adrenal gland unremarkable. 4.9 x 4.0 cm mass in the left adrenal gland measures larger than the previously reported measurements.  Both kidneys appear atrophic. 18 mm interpolar lesion in the left kidney approaches water attenuation and is likely a cyst. No hydronephrosis. No hydroureter. The urinary bladder appears normal for the degree of distention.  Stomach/Bowel: Small hiatal hernia. Stomach otherwise  unremarkable. Duodenum is normally positioned as is the ligament of Treitz. No small bowel wall thickening. No small bowel dilatation. Terminal ileum not well seen. The appendix is not visualized, but there is no edema or inflammation in the region of the cecum. Diverticuli are seen scattered along the entire length of the colon without CT findings of diverticulitis.  Vascular/Lymphatic: There is abdominal aortic atherosclerosis without aneurysm. No retroperitoneal lymphadenopathy. Assessment of the mesentery is degraded by motion and edema. No pelvic sidewall lymphadenopathy.  Reproductive: Uterus surgically absent.  No adnexal mass.  Other: Moderate volume intraperitoneal free fluid noted. Edema is seen within the mesentery in soft tissues of the pelvic floor.  Musculoskeletal: Bone windows reveal no worrisome lytic or sclerotic osseous lesions. Diffuse body wall edema is evident.  IMPRESSION: 1. Large hypo attenuating lesion in the body the pancreas measures similar to dimensions reported lawn outside CT scan from [facility] dated 01/29/2014. This cannot be definitively characterize today and neoplasm remains a distinct consideration. Direct comparison to earlier imaging studies would be helpful. If the patient can receive intravenous contrast material, MRI without with contrast would be the study of choice to further evaluate. 2. 4.9 cm left adrenal mass measures larger than reported on the previous  study from about 2 years ago. This does appear to have some macroscopic fat attenuation within the lesion and may represent a myelo lipoma but metastatic disease or primary adrenal neoplasm cannot be excluded. Direct comparison earlier imaging studies would prove helpful to further evaluate. 3. Imaging features suggestive of, but not definite for cirrhosis. 4. Moderate ascites.  Diffuse body wall edema associated. 5. Abdominal aortic atherosclerosis.   Electronically Signed   By: Misty Stanley M.D.   On: 11/04/2015 14:04  Assessment: 1. Abnormal CT abdomen and pelvis: See above with abdominal wall edema, suggestive features of cirrhosis and moderate ascites; believe that likely her ascites is due to heart failure, but patient would likely benefit from a diagnostic/therapeutic paracentesis in the future, though this should be discussed with and arranged through her nephrologist as she also has chronic kidney failure 2. Abdominal bloating: Due to above 3. Ascites: See above 4. History of CHF: Currently on Lasix 80 mg twice a day and Eliquis 5. Chronic kidney disease: Most recent creatinine 10/03/15, 2.01, repeat labs today  Plan: 1. Discussed case with Dr. Carlean Purl at time of patient's appointment. Ordered labs to include CBC, CMP and BNP. 2. Explained to the patient and her son that she would likely benefit from a paracentesis in the future, though we suspect that her fluid and ascites is due to her biventricular heart failure and due to her kidney failure, we need to recheck some labs today and possibly have her see her nephrologist before pursuing this. She and her son verbalized understanding. 3. Patient should continue her Lasix 80 mg twice a day as prescribed by her cardiologist. 4. Explained to the patient should await a call from Dr. Carlean Purl and/or his nurse within the next day or 2 to discuss further recommendations. 5. It was also noted that the patient is on Eliquis chronically,  which also complicates an urgent paracentesis. 6. Patient instructed to proceed to the ER if she has an increased/exacerbation of symptoms.  Ellouise Newer, PA-C [facility] 11/09/2015, 2:04 PM  Cc: Hoyt Koch, *

## 2015-11-09 NOTE — Progress Notes (Signed)
It is okay to hold Eliquis for the paracentesis.

## 2015-11-10 ENCOUNTER — Other Ambulatory Visit: Payer: Self-pay

## 2015-11-10 ENCOUNTER — Telehealth: Payer: Self-pay | Admitting: Internal Medicine

## 2015-11-10 DIAGNOSIS — R188 Other ascites: Secondary | ICD-10-CM

## 2015-11-10 NOTE — Telephone Encounter (Signed)
See lab results for additional details.  

## 2015-11-10 NOTE — Progress Notes (Signed)
She does not need to see Dr. Tamala Julian or Dr. Lorrene Reid yet as previously thought but may after we see paracentesis results.

## 2015-11-10 NOTE — Progress Notes (Signed)
Dr. Tamala Julian oked holding Eliquis 2 d before Resume day after paracentesis

## 2015-11-15 ENCOUNTER — Ambulatory Visit (HOSPITAL_COMMUNITY)
Admission: RE | Admit: 2015-11-15 | Discharge: 2015-11-15 | Disposition: A | Discharge status: Home / Self Care | Payer: Medicare Other | Source: Ambulatory Visit | Attending: Internal Medicine | Admitting: Internal Medicine

## 2015-11-15 DIAGNOSIS — R188 Other ascites: Secondary | ICD-10-CM

## 2015-11-15 DIAGNOSIS — R8569 Abnormal cytological findings in specimens from other digestive organs and abdominal cavity: Secondary | ICD-10-CM | POA: Diagnosis not present

## 2015-11-15 LAB — BODY FLUID CELL COUNT WITH DIFFERENTIAL
LYMPHS FL: 26 %
Monocyte-Macrophage-Serous Fluid: 65 % (ref 50–90)
Neutrophil Count, Fluid: 9 % (ref 0–25)
Total Nucleated Cell Count, Fluid: 324 cu mm (ref 0–1000)

## 2015-11-15 LAB — ALBUMIN, FLUID (OTHER): Albumin, Fluid: 2.4 g/dL

## 2015-11-15 LAB — PROTEIN, BODY FLUID: TOTAL PROTEIN, FLUID: 3.7 g/dL

## 2015-11-15 LAB — GRAM STAIN

## 2015-11-15 NOTE — Procedures (Signed)
Ultrasound-guided diagnostic and therapeutic paracentesis performed yielding 1.1 liters (maximum ordered) of yellow colored fluid. No immediate complications. The fluid was sent to the lab for preordered studies.

## 2015-11-16 ENCOUNTER — Telehealth: Payer: Self-pay | Admitting: Internal Medicine

## 2015-11-16 LAB — AMYLASE, PERITONEAL FLUID: AMYLASE, PERITONEAL FLUID: 29 U/L

## 2015-11-17 NOTE — Telephone Encounter (Signed)
Son notified that most of the labs are back from paracentesis.  He reports that his mom is uncomfortable. Please advise next step.  He is aware that you are out of town until Monday

## 2015-11-18 ENCOUNTER — Encounter: Payer: Self-pay | Admitting: Interventional Cardiology

## 2015-11-18 ENCOUNTER — Telehealth: Payer: Self-pay | Admitting: Interventional Cardiology

## 2015-11-18 ENCOUNTER — Inpatient Hospital Stay (HOSPITAL_COMMUNITY)
Admission: AD | Admit: 2015-11-18 | Discharge: 2015-11-23 | DRG: 291 | Disposition: A | Discharge status: Home / Self Care | Payer: Medicare Other | Source: Ambulatory Visit | Attending: Interventional Cardiology | Admitting: Interventional Cardiology

## 2015-11-18 ENCOUNTER — Ambulatory Visit (HOSPITAL_COMMUNITY): Payer: Medicare Other | Admitting: Interventional Cardiology

## 2015-11-18 VITALS — BP 148/90 | HR 90 | Ht 65.0 in | Wt 189.4 lb

## 2015-11-18 DIAGNOSIS — I480 Paroxysmal atrial fibrillation: Secondary | ICD-10-CM | POA: Diagnosis present

## 2015-11-18 DIAGNOSIS — R188 Other ascites: Secondary | ICD-10-CM | POA: Diagnosis not present

## 2015-11-18 DIAGNOSIS — I130 Hypertensive heart and chronic kidney disease with heart failure and stage 1 through stage 4 chronic kidney disease, or unspecified chronic kidney disease: Principal | ICD-10-CM | POA: Diagnosis present

## 2015-11-18 DIAGNOSIS — E1129 Type 2 diabetes mellitus with other diabetic kidney complication: Secondary | ICD-10-CM | POA: Diagnosis not present

## 2015-11-18 DIAGNOSIS — I129 Hypertensive chronic kidney disease with stage 1 through stage 4 chronic kidney disease, or unspecified chronic kidney disease: Secondary | ICD-10-CM | POA: Diagnosis not present

## 2015-11-18 DIAGNOSIS — N183 Chronic kidney disease, stage 3 (moderate): Secondary | ICD-10-CM | POA: Diagnosis not present

## 2015-11-18 DIAGNOSIS — E559 Vitamin D deficiency, unspecified: Secondary | ICD-10-CM | POA: Diagnosis present

## 2015-11-18 DIAGNOSIS — D472 Monoclonal gammopathy: Secondary | ICD-10-CM | POA: Diagnosis not present

## 2015-11-18 DIAGNOSIS — E1122 Type 2 diabetes mellitus with diabetic chronic kidney disease: Secondary | ICD-10-CM | POA: Diagnosis present

## 2015-11-18 DIAGNOSIS — I10 Essential (primary) hypertension: Secondary | ICD-10-CM

## 2015-11-18 DIAGNOSIS — I272 Other secondary pulmonary hypertension: Secondary | ICD-10-CM | POA: Diagnosis present

## 2015-11-18 DIAGNOSIS — G4733 Obstructive sleep apnea (adult) (pediatric): Secondary | ICD-10-CM | POA: Diagnosis not present

## 2015-11-18 DIAGNOSIS — D649 Anemia, unspecified: Secondary | ICD-10-CM | POA: Diagnosis not present

## 2015-11-18 DIAGNOSIS — I4510 Unspecified right bundle-branch block: Secondary | ICD-10-CM

## 2015-11-18 DIAGNOSIS — I5023 Acute on chronic systolic (congestive) heart failure: Secondary | ICD-10-CM

## 2015-11-18 DIAGNOSIS — I4892 Unspecified atrial flutter: Secondary | ICD-10-CM | POA: Diagnosis present

## 2015-11-18 DIAGNOSIS — I2510 Atherosclerotic heart disease of native coronary artery without angina pectoris: Secondary | ICD-10-CM | POA: Diagnosis not present

## 2015-11-18 DIAGNOSIS — E7800 Pure hypercholesterolemia, unspecified: Secondary | ICD-10-CM | POA: Diagnosis not present

## 2015-11-18 DIAGNOSIS — E669 Obesity, unspecified: Secondary | ICD-10-CM | POA: Diagnosis present

## 2015-11-18 DIAGNOSIS — I5043 Acute on chronic combined systolic (congestive) and diastolic (congestive) heart failure: Secondary | ICD-10-CM | POA: Diagnosis not present

## 2015-11-18 DIAGNOSIS — I509 Heart failure, unspecified: Secondary | ICD-10-CM | POA: Diagnosis not present

## 2015-11-18 DIAGNOSIS — Z7901 Long term (current) use of anticoagulants: Secondary | ICD-10-CM

## 2015-11-18 DIAGNOSIS — N2581 Secondary hyperparathyroidism of renal origin: Secondary | ICD-10-CM | POA: Diagnosis present

## 2015-11-18 DIAGNOSIS — Z6831 Body mass index (BMI) 31.0-31.9, adult: Secondary | ICD-10-CM

## 2015-11-18 DIAGNOSIS — Z79899 Other long term (current) drug therapy: Secondary | ICD-10-CM

## 2015-11-18 DIAGNOSIS — I50813 Acute on chronic right heart failure: Secondary | ICD-10-CM

## 2015-11-18 DIAGNOSIS — D631 Anemia in chronic kidney disease: Secondary | ICD-10-CM | POA: Diagnosis not present

## 2015-11-18 DIAGNOSIS — Z8249 Family history of ischemic heart disease and other diseases of the circulatory system: Secondary | ICD-10-CM | POA: Diagnosis not present

## 2015-11-18 DIAGNOSIS — Z90710 Acquired absence of both cervix and uterus: Secondary | ICD-10-CM

## 2015-11-18 HISTORY — DX: Atrioventricular block, first degree: I44.0

## 2015-11-18 HISTORY — DX: Unspecified atrial flutter: I48.92

## 2015-11-18 LAB — BASIC METABOLIC PANEL
Anion gap: 14 (ref 5–15)
BUN: 39 mg/dL — AB (ref 6–20)
CALCIUM: 9.2 mg/dL (ref 8.9–10.3)
CO2: 20 mmol/L — ABNORMAL LOW (ref 22–32)
CREATININE: 2.2 mg/dL — AB (ref 0.44–1.00)
Chloride: 104 mmol/L (ref 101–111)
GFR calc Af Amer: 23 mL/min — ABNORMAL LOW (ref >60)
GFR, EST NON AFRICAN AMERICAN: 20 mL/min — AB (ref >60)
GLUCOSE: 101 mg/dL — AB (ref 65–99)
POTASSIUM: 3.7 mmol/L (ref 3.5–5.1)
SODIUM: 138 mmol/L (ref 135–145)

## 2015-11-18 LAB — CBC WITH DIFFERENTIAL/PLATELET
BASOS ABS: 0 10*3/uL (ref 0.0–0.1)
BASOS PCT: 0 %
EOS ABS: 0.1 10*3/uL (ref 0.0–0.7)
EOS PCT: 1 %
HCT: 38.5 % (ref 36.0–46.0)
Hemoglobin: 13 g/dL (ref 12.0–15.0)
LYMPHS PCT: 21 %
Lymphs Abs: 1.4 10*3/uL (ref 0.7–4.0)
MCH: 27.4 pg (ref 26.0–34.0)
MCHC: 33.8 g/dL (ref 30.0–36.0)
MCV: 81.2 fL (ref 78.0–100.0)
MONO ABS: 0.7 10*3/uL (ref 0.1–1.0)
Monocytes Relative: 11 %
Neutro Abs: 4.5 10*3/uL (ref 1.7–7.7)
Neutrophils Relative %: 67 %
PLATELETS: 322 10*3/uL (ref 150–400)
RBC: 4.74 MIL/uL (ref 3.87–5.11)
RDW: 15 % (ref 11.5–15.5)
WBC: 6.7 10*3/uL (ref 4.0–10.5)

## 2015-11-18 MED ORDER — ACETAMINOPHEN 325 MG PO TABS: 650.0000 mg | ORAL_TABLET | ORAL | Status: DC | PRN

## 2015-11-18 MED ORDER — SODIUM CHLORIDE 0.9 % IV SOLN: 250.0000 mL | INTRAVENOUS | Status: DC | PRN

## 2015-11-18 MED ORDER — DILTIAZEM HCL ER COATED BEADS 240 MG PO CP24
240.0000 mg | ORAL_CAPSULE | Freq: Every day | ORAL | Status: DC
  Administered 2015-11-18 – 2015-11-21 (×4): 240 mg via ORAL
  Filled 2015-11-18 (×4): qty 1

## 2015-11-18 MED ORDER — FUROSEMIDE 10 MG/ML IJ SOLN: 80.0000 mg | Freq: Three times a day (TID) | INTRAMUSCULAR | Status: DC

## 2015-11-18 MED ORDER — ISOSORBIDE MONONITRATE ER 60 MG PO TB24
60.0000 mg | ORAL_TABLET | Freq: Every day | ORAL | Status: DC
  Administered 2015-11-19 – 2015-11-21 (×3): 60 mg via ORAL
  Filled 2015-11-18 (×3): qty 1

## 2015-11-18 MED ORDER — ATORVASTATIN CALCIUM 20 MG PO TABS
20.0000 mg | ORAL_TABLET | Freq: Every day | ORAL | Status: DC
  Administered 2015-11-18 – 2015-11-22 (×5): 20 mg via ORAL
  Filled 2015-11-18 (×5): qty 1

## 2015-11-18 MED ORDER — FUROSEMIDE 10 MG/ML IJ SOLN
80.0000 mg | Freq: Three times a day (TID) | INTRAMUSCULAR | Status: DC
  Administered 2015-11-18 – 2015-11-19 (×3): 80 mg via INTRAVENOUS
  Filled 2015-11-18 (×3): qty 8

## 2015-11-18 MED ORDER — SIMVASTATIN 40 MG PO TABS: 40.0000 mg | ORAL_TABLET | Freq: Every day | ORAL | Status: DC

## 2015-11-18 MED ORDER — DILTIAZEM HCL ER COATED BEADS 240 MG PO CP24: 240.0000 mg | ORAL_CAPSULE | Freq: Every day | ORAL | Status: DC

## 2015-11-18 MED ORDER — SODIUM CHLORIDE 0.9% FLUSH
3.0000 mL | Freq: Two times a day (BID) | INTRAVENOUS | Status: DC
  Administered 2015-11-19 – 2015-11-23 (×9): 3 mL via INTRAVENOUS

## 2015-11-18 MED ORDER — SODIUM CHLORIDE 0.9% FLUSH: 3.0000 mL | INTRAVENOUS | Status: DC | PRN

## 2015-11-18 MED ORDER — DIGOXIN 125 MCG PO TABS
0.0625 mg | ORAL_TABLET | ORAL | Status: DC
  Administered 2015-11-21 – 2015-11-23 (×2): 0.0625 mg via ORAL
  Filled 2015-11-18 (×2): qty 1

## 2015-11-18 MED ORDER — ONDANSETRON HCL 4 MG/2ML IJ SOLN: 4.0000 mg | Freq: Four times a day (QID) | INTRAMUSCULAR | Status: DC | PRN

## 2015-11-18 MED ORDER — HYDRALAZINE HCL 25 MG PO TABS
25.0000 mg | ORAL_TABLET | Freq: Three times a day (TID) | ORAL | Status: DC
  Administered 2015-11-18 – 2015-11-20 (×7): 25 mg via ORAL
  Filled 2015-11-18 (×6): qty 1

## 2015-11-18 MED ORDER — APIXABAN 2.5 MG PO TABS
2.5000 mg | ORAL_TABLET | Freq: Two times a day (BID) | ORAL | Status: DC
  Administered 2015-11-18 – 2015-11-23 (×10): 2.5 mg via ORAL
  Filled 2015-11-18 (×10): qty 1

## 2015-11-18 NOTE — Progress Notes (Addendum)
Cardiology Office Note    Date:  11/18/2015   ID:  Dona, Tomasso May 07, 1930, MRN [id number]  PCP:  Hoyt Koch, MD  Cardiologist: Sinclair Grooms, MD   Chief Complaint  Patient presents with  . Congestive Heart Failure    Ascites    History of Present Illness:  Wendy Kerr is a 82 y.o. female who presents today for evaluation after being noted to have progressive development of ascites and recent paracentesis suggesting a transudative effusion consistent with heart failure. Dr. Carlean Purl the gastroenterologist this has to we help manage the patient's clinical condition.  The background history is that of chronic anemia, chronic kidney disease stage III IV, hypertension, suspected underlying coronary artery disease, type 2 diabetes mellitus, obstructive sleep apnea, and adrenal mass and presumed benign pancreatic lesion noted on CT.  For the past 4-5 weeks patient has developed increasing abdominal girth. She has noted lower extremity swelling. She has early satiety due to increased abdominal girth. She denies orthopnea. There is no exertional dyspnea. She has not had chest pain or prolonged palpitations. She has noted a 10-15 pound weight gain over the past month. When seen in July she weighed 180 pounds. Based upon I weight today she weighs 190 pounds.  In the past she has had episodes of left heart failure with associated dyspnea. This has not occurred recently.  Past Medical History:  Diagnosis Date  . Anemia   . Benign neoplasm of pancreas, except islets of Langerhans   . Bronchitis, mucopurulent recurrent ([place])   . Common migraine   . Diabetes mellitus ([place])   . Diverticulosis of colon   . DJD (degenerative joint disease)   . Generalized anxiety disorder   . Hypercholesterolemia   . Hypertension   . Monoclonal gammopathy   . Obesity   . OSA (obstructive sleep apnea)    severe with AHI 37 events per hour  . Vitamin D deficiency     Past Surgical  History:  Procedure Laterality Date  . ABDOMINAL HYSTERECTOMY  1972  . resection serous cystadenoma of pancreas  2004   at [facility].  Now (2017) with Dr Bridgett Larsson with [facility]    Current Medications: Outpatient Medications Prior to Visit  Medication Sig Dispense Refill  . digoxin (LANOXIN) 0.125 MG tablet Take 0.5 tablets (0.0625 mg total) by mouth every Monday, Wednesday, and Friday. 15 tablet 5  . diltiazem (CARDIZEM CD) 240 MG 24 hr capsule Take 1 capsule (240 mg total) by mouth daily. 60 capsule 5  . ELIQUIS 2.5 MG TABS tablet TAKE 1 TABLET BY MOUTH 2 TIMES DAILY. 60 tablet 8  . ergocalciferol (VITAMIN D2) 50000 units capsule Take 50,000 Units by mouth once a week.    . furosemide (LASIX) 80 MG tablet TAKE 1 TABLET BY MOUTH TWICE A DAY 180 tablet 2  . hydrALAZINE (APRESOLINE) 25 MG tablet TAKE 1 TABLET BY MOUTH 3 TIMES DAILY 90 tablet 3  . isosorbide mononitrate (IMDUR) 60 MG 24 hr tablet TAKE 1 TABLET (60 MG TOTAL) BY MOUTH EVERY MORNING. 30 tablet 8  . Multiple Vitamin (MULTIVITAMIN WITH MINERALS) TABS tablet Take 1 tablet by mouth every morning.    . nitroGLYCERIN (NITROSTAT) 0.4 MG SL tablet Place 1 tablet (0.4 mg total) under the tongue every 5 (five) minutes as needed for chest pain. 5 tablet 3  . OVER THE COUNTER MEDICATION Take 1 tablet by mouth daily. MED NAME: IRON    . simvastatin (ZOCOR) 40 MG  tablet TAKE 1 TABLET BY MOUTH AT BEDTIME 90 tablet 1   No facility-administered medications prior to visit.      Allergies:   Piroxicam   Social History   Social History  . Marital status: Single    Spouse name: N/A  . Number of children: 2  . Years of education: N/A   Occupational History  . 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 Smoking status: Never Smoker  . Smokeless tobacco: Never Used  . Alcohol use No  . Drug use: No  . Sexual activity: Not Asked   Other Topics Concern  . None   Social History Narrative   Retired Educational psychologist.   Single.  Granddaughter lives with her.  Ambulates independently.     Family History:  The patient's family history includes Heart Problems (age of onset: 37) in her mother; Heart Problems (age of onset: 90) in her father; Hypertension in her father and mother; Kidney disease in her other and sister; Kidney failure in her father and mother.   ROS:   Please see the history of present illness. decreased hearing, vision disturbance, cough, stable shortness of breath. PND. Lower extremity swelling. Unexplained 10 pound weight gain. All other systems reviewed and are negative.   PHYSICAL EXAM:   VS:  BP (!) 148/90   Pulse 90   Ht 5\' 5"  (1.651 m)   Wt 189 lb 6.4 oz (85.9 kg)   BMI 31.52 kg/m    GEN: Well nourished, well developed, in no acute distress  HEENT: normal  Neck: Marked JVD to the angle of the jaw bilaterally. No carotid bruits, or masses. Cardiac: RRR; no murmurs, rubs. A right ventricular S4 gallop is heard which increases in intensity with deep inspiration. There is  2-3+ edema from ankles to the knees bilaterally.  Respiratory:  clear to auscultation bilaterally, normal work of breathing GI: soft, nontender, nondistended, + BS MS: no deformity or atrophy  Skin: warm and dry, no rash Neuro:  Alert and Oriented x 3, Strength and sensation are intact Psych: euthymic mood, full affect  Wt Readings from Last 3 Encounters:  11/18/15 189 lb 6.4 oz (85.9 kg)  11/09/15 190 lb 0.3 oz (86.2 kg)  11/02/15 190 lb (86.2 kg)      Studies/Labs Reviewed:   EKG:  EKG  Sinus rhythm at 90 bpm, first-degree AV block, right bundle branch block with left anterior hemiblock, inferior and anterolateral infarcts cannot be excluded. No changes noted when compared to July 2017 tracing.   Recent Labs: 11/09/2015: ALT 14; BUN 40; Creatinine, Ser 2.15; Hemoglobin 12.7; Platelets 333.0; Potassium 4.0; Pro B Natriuretic peptide (BNP) 1,559.0; Sodium 138   Lipid Panel    Component Value Date/Time    CHOL 185 07/30/2012 1052   TRIG 119.0 07/30/2012 1052   HDL 59.70 07/30/2012 1052   CHOLHDL 3 07/30/2012 1052   VLDL 23.8 07/30/2012 1052   LDLCALC 102 (H) 07/30/2012 1052   LDLDIRECT 142.5 08/11/2007 1021    Additional studies/ records that were reviewed today include:  Myocardial perfusion imaging 06/23/14: Lexiscan stress.  IMPRESSION: Intermediate risk study because of moderately reduced EF (33%). No evidence for ischemia or infarction by perfusion images.  Echocardiogram 06/24/2014: Study Conclusions  - Left ventricle: The cavity size was normal. There was moderate   concentric hypertrophy. Systolic function was normal. The   estimated ejection fraction was in the range of 50% to 55%. Wall  motion was normal; there were no regional wall motion   abnormalities. Features are consistent with a pseudonormal left   ventricular filling pattern, with concomitant abnormal relaxation   and increased filling pressure (grade 2 diastolic dysfunction).   Doppler parameters are consistent with high ventricular filling   pressure. - Aortic valve: Moderate diffuse thickening and calcification,   consistent with sclerosis. - Mitral valve: There was trivial regurgitation. - Right ventricle: The cavity size was moderately dilated. Wall   thickness was normal. Systolic function was moderately to   severely reduced. - Right atrium: The atrium was moderately dilated. - Tricuspid valve: There was moderate regurgitation. - Pulmonic valve: There was trivial regurgitation. - Pulmonary arteries: PA peak pressure: 61 mm Hg (S). - Pericardium, extracardiac: A trivial, free-flowing pericardial   effusion was identified circumferential to the heart. The fluid   had no internal echoes.  Impressions:  - The right ventricular systolic pressure was increased consistent   with moderate pulmonary hypertension.  ASSESSMENT:    1. Acute on chronic systolic right heart failure ([place])   2.  Essential hypertension   3. CKD stage 3 due to type 2 diabetes mellitus ([place])   4. OSA (obstructive sleep apnea)   5. Right bundle branch block   6. Coronary artery disease involving native coronary artery of native heart without angina pectoris      PLAN:  In order of problems listed above:   1. The patient has clinical evidence of pulmonary hypertension and right ventricular systolic dysfunction.. There is lower extremity edema and ascites. I suspect progression of pulmonary hypertension as documented by echocardiogram noted in 2016. She is being admitted to the hospital for careful diuresis with IV furosemide. A nephrology consult will be obtained. She may need to have metolazone added. Distal tubular agents may not be possible because of CKD.  Nephrology consult  IV diuresis  Close follow-up of kidney function  Repeat 2-D Doppler echocardiogram  TSH  Depending upon findings of echo, may need to have further workup and management of/ for pulmonary hypertension.  Please refer to the results of the recent paracentesis.    2. Blood pressure is adequate at this time  3. Chronic kidney disease stage III/IV which is likely to worsen with diuresis.  4. Obstructive sleep apnea is being treated with Cipro very compliantly according to the patient.  5. Right bundle branch block and left anterior hemiblock unchanged  6. Presumed coronary disease stable without angina.   Medication Adjustments/Labs and Tests Ordered: Current medicines are reviewed at length with the patient today.  Concerns regarding medicines are outlined above.  Medication changes, Labs and Tests ordered today are listed in the Patient Instructions below. There are no Patient Instructions on file for this visit.   Signed, Sinclair Grooms, MD  11/18/2015 2:28 PM    [facility] [address], [place], [place]  [address] Phone: [phone]; Fax: [phone]

## 2015-11-18 NOTE — Telephone Encounter (Signed)
New message     Pt son wants pt to be admitted to [facility]    Pt c/o swelling: STAT is pt has developed SOB within 24 hours  1. How long have you been experiencing swelling? In her abdomen   2. Where is the swelling located? Heart  3.  Are you currently taking a "fluid pill"? yes  4.  Are you currently SOB? no  5.  Have you traveled recently? no  Ct shows fluid in abdomen Dr.Crawford referred pt to the GI and Dr.Gasner is out of town and she needs to be admitted

## 2015-11-18 NOTE — Telephone Encounter (Signed)
I spoke with Wendy Kerr again, her son.  He verbalized understanding.  He would like to try and have cardiology see her today before trying ED.  He is worried he won't be able to explain what is going on and ED may not keep her.  I will fax a copy of Dr. Celesta Aver recommendations to him at [phone].  He does understand to take her to [facility] ED if Cardiology can't see her today.

## 2015-11-18 NOTE — Telephone Encounter (Signed)
Spoke with pt son Wendy Kerr. Dr.Smith can see the pt today @1 :45pm. Wendy Kerr sts that they will be there and voiced appreciation for the call back.

## 2015-11-18 NOTE — Consult Note (Signed)
[facility] Consultation Note Requesting Physician:  Dr. Pernell Dupre  Primary Nephrologist: Dr. Jamal Maes (seen X 1 at [facility]) Reason for Consult:  CKD with acute on chronic RV dysfunction in need of diuresis  HPI: The patient is a 82 y.o. year-old AAF with PMH of HTN since age 38, DM (prev on metformin, now diet controlled), OSA (severe on CPAP), PAF, MGUS, obesity, diastolic heart failure, and CKD 3 just on the cusp of stage 4 with proteinuria, with a + FH of kidney disease (both parents, niece). I saw her at [facility] in consultation on 09/08/15 at which time BP was normal, exam showed 1+ peripheral edema, and she acknowledged some salt and water indiscretion.  Creatinine at the time of that visit was 2.68 but was on DS Bactrim for UTI, and after finishing that med, creatinine came back down to 2.19.  We talked about the possible need for dialysis in the future and she indicated that she would do it if she had to (5 risk factors including, DM, HTN, +FH, AA race and aging).  At the time of her [facility] appt she weighed 185 lb on our scales. Other labs included Hb 11.9, UPC 2/049 gm, PTH 58, TSat 17%. Renal US showed 10.9 and 11.7 cm kdineys with simple cysts.  She subsequently reported a 10 lb weight gain over the course of a 2 week period , with some but not much worsening of LE edema, and "bloating" which turned out to be ascites. She was referred to Dr. Carlean Purl on 9/13 who felt her ascites was due to heart failure. Creatinine at that time was stable at 2.15. CT scan showed moderate ascites, abd wall edema, small effusions.  (Also noted was large mass in pancreatic body not really changed from an imaging study from [place] and a left adrenal mass, larger than on prior imaging studies). Dr. Carlean Purl ordered a diagnostic paracentesis (1.1 liters, done on the 13th, transudative in nature). She had become progressively uncomfortable, and today saw Dr. Tamala Julian in the office who felt that she  had acute on chronic systolic right CHF and she is admitted for diuresis.  We are consulted to follow her renal function with diuresis.   Creatinine summary for reference is as follows: Date/Time Value  11/09/2015  2.15 (H)  09/25/15 2/01  ([facility])  09/08/15 2.68 ([facility]; on DS Bactrim for UTI)  08/04/2015  1.90 (H)  11/30/2014 2.01 (H)  08/13/2014 2.32 (H)  06/28/2014  1.92 (H)  06/24/2014  1.81 (H)  06/23/2014 2.07 (H)  06/22/2014 2.05 (H)  06/21/2014  2.24 (H)  01/04/2014  1.3 (H)    Past Medical History:  Diagnosis Date  . Anemia   . Benign neoplasm of pancreas, except islets of Langerhans   . Bronchitis, mucopurulent recurrent ([place])   . Common migraine   . Diabetes mellitus ([place])   . Diverticulosis of colon   . DJD (degenerative joint disease)   . Generalized anxiety disorder   . Hypercholesterolemia   . Hypertension   . Monoclonal gammopathy   . Obesity   . OSA (obstructive sleep apnea)    severe with AHI 37 events per hour  . Vitamin D deficiency     Past Surgical History:  Procedure Laterality Date  . ABDOMINAL HYSTERECTOMY  1972  . resection serous cystadenoma of pancreas  2004   at [facility].  Now (2017) with Dr Bridgett Larsson with [facility]     Family History  Problem Relation Age of  Onset  . Hypertension Mother   . Kidney failure Mother     dialysis  . Heart Problems Mother 13  . Hypertension Father   . Kidney failure Father     dialysis  . Heart Problems Father 11  . Kidney disease Sister   . Kidney disease Other    Social History:  reports that she has never smoked. She has never used smokeless tobacco. She reports that she does not drink alcohol or use drugs.   Allergies  Allergen Reactions  . Piroxicam Other (See Comments)    REACTION: HEADACHE    Home medications: Prior to Admission medications   Medication Sig Start Date End Date Taking? Authorizing Provider  digoxin (LANOXIN) 0.125 MG tablet Take 0.5 tablets (0.0625 mg total) by mouth every  Monday, Wednesday, and Friday. 09/26/15   Belva Crome, MD  diltiazem (CARDIZEM CD) 240 MG 24 hr capsule Take 1 capsule (240 mg total) by mouth daily. 11/02/15   Hoyt Koch, MD  ELIQUIS 2.5 MG TABS tablet TAKE 1 TABLET BY MOUTH 2 TIMES DAILY. 07/18/15   Belva Crome, MD  ergocalciferol (VITAMIN D2) 50000 units capsule Take 50,000 Units by mouth once a week.    Historical Provider, MD  furosemide (LASIX) 80 MG tablet TAKE 1 TABLET BY MOUTH TWICE A DAY 09/28/15   Belva Crome, MD  hydrALAZINE (APRESOLINE) 25 MG tablet TAKE 1 TABLET BY MOUTH 3 TIMES DAILY 06/29/15   Hoyt Koch, MD  isosorbide mononitrate (IMDUR) 60 MG 24 hr tablet TAKE 1 TABLET (60 MG TOTAL) BY MOUTH EVERY MORNING. 02/14/15   Belva Crome, MD  Multiple Vitamin (MULTIVITAMIN WITH MINERALS) TABS tablet Take 1 tablet by mouth every morning.    Historical Provider, MD  nitroGLYCERIN (NITROSTAT) 0.4 MG SL tablet Place 1 tablet (0.4 mg total) under the tongue every 5 (five) minutes as needed for chest pain. 08/13/14   Hoyt Koch, MD  OVER THE COUNTER MEDICATION Take 1 tablet by mouth daily. MED NAME: IRON    Historical Provider, MD  simvastatin (ZOCOR) 40 MG tablet TAKE 1 TABLET BY MOUTH AT BEDTIME 08/08/15   Hoyt Koch, MD    Inpatient medications: . apixaban  2.5 mg Oral BID  . digoxin  0.0625 mg Oral Q M,W,F  . diltiazem  240 mg Oral Daily  . furosemide  80 mg Intravenous Q8H  . hydrALAZINE  25 mg Oral TID  . isosorbide mononitrate  60 mg Oral Daily  . simvastatin  40 mg Oral QHS  . sodium chloride flush  3 mL Intravenous Q12H    Review of Systems Reports about 4 weeks ago onset of what she thought was bloating, not responsive to [employer] over a 2 week period with worsening abd distension No fever, chills, abd pain, nausea, vomiting. + abd wall edema No PND or orthopnea, no exertional dyspnea No SOB at time of worsening ascites, but early satiety and "abd pushing my lungs up" No change in UOP  and no color change  Physical Exam:  Blood pressure (!) 143/80, pulse 88, temperature 98.6 F (37 C), temperature source Oral, resp. rate 18, height 5\' 5"  (1.651 m), weight 85.7 kg (189 lb), SpO2 99 %.  Gen: Delightful older AAM, looks younger than her stated age of 66 Lines/tubes: None Skin: no rash, cyanosis No JVD at 30 degress Chest: Lungs are posteriorly clear Heart: Regular with wide split S2 and no murmur Abdomen: Protuberant, + BS, + abd wall  edema (not present on 09/08/15), + fluid wave, not tender Ext: 2+ pretibial edema, no thigh edema  (about same as 09/08/15) Neuro: alert, Ox3, no focal deficit Heme/Lymph: no bruising or LAN Dialysis Access: None  Labs: All currently pending  Background: 82 y.o. year-old AAF with PMH of HTN since age 72, DM (prev on metformin, now diet controlled), OSA (severe on CPAP), PAF, MGUS, obesity, diastolic heart failure, and CKD 3 just on the cusp of stage 4 with proteinuria, with a + FH of kidney disease (both parents, niece).Developed what she initially though to be bloating, with subsequent 10 lb weight gain over the course of a week, with some worsening of LE edema, and ascites. She was referred to Dr. Carlean Purl on 9/13 who felt her ascites was due to heart failure. CT scan showed moderate ascites, abd wall edema, small effusions.  (Also noted was large mass in pancreatic body not really changed from an imaging study from [place] and a left adrenal mass, larger than on prior imaging studies). Diagnostic paracentesis (1.1 liters, done on the 13th) was transudative in nature). She had become progressively uncomfortable, and today saw Dr. Tamala Julian in the office who felt that she had acute on chronic systolic right CHF and she is admitted for diuresis.  We are consulted to follow her renal function with diuresis.  Assessment/Recommendations  1. Acute on chronic systolic right CHF - [id number] weight gain, ascites, worsening edema despite 80 mg lasix BID at home,  creatinine of 2/GFR right around 25-30.  Dr. Tamala Julian has written for 80 of IV furosemide Q8H and this is reasonable place to start with diuresis, though likely will require much higher doses and with addition of metolazone. . 2. CKD 3/4 - risk factors of DM, HTN, +FH, AA  race and aging.  She sits right on the cusp of Stage 4 CKD. With diuresis adequate to relieve her edema and ascites, her renal function will undoubtedly worsen.  She has indicated that she would undergo dialysis if she had to (short OR long term) and her 2 sons have both worked in [place] had other family ON dialysis so she has understanding of what this would mean.  3. Ascites - new issue. Transudative by studies 10/2015. S/p 1.1 Liter paracentesis 11/09/15.  (Seems odd to me that she developed acute transudative ascites WITHOUT much change in her LE edema at all if this is all heart failure...)  4. CKD-MBD - normal PTH of 58 July 2017  5. Anemia - Hb 12.7 11/09/15. No ESA requirement.  6. OSA - CPAP  7. DM - diet controlled  8. HTN - we may ned to back off on meds to give more room for diuresis but continue hydralazine and BB for now.   Jamal Maes,  MD [facility] [phone] pager 11/18/2015, 5:29 PM

## 2015-11-18 NOTE — H&P (Addendum)
Status: Sign at close encounter  Expand All Collapse All      Cardiology Office Note    Date:  11/18/2015   ID:  Taye, Maack 1931-01-31, MRN [id number]  PCP:  Hoyt Koch, MD        Cardiologist: Sinclair Grooms, MD       Chief Complaint  Patient presents with  . Congestive Heart Failure    Ascites    History of Present Illness:  Wendy Kerr is a 82 y.o. female who presents today for evaluation after of progressive of ascites and recent paracentesis suggest transudative effusion consistent with heart failure. Dr. Carlean Purl the gastroenterologist has asked to help help manage the patient's clinical condition.  The background history is that of chronic anemia, chronic kidney disease stage III/IV, hypertension, suspected underlying coronary artery disease, type 2 diabetes mellitus, obstructive sleep apnea, chronic combined systolic and diastolic HF, adrenal mass, and presumed benign pancreatic lesion noted on CT.  For the past 4-5 weeks patient has developed increasing abdominal girth. She has noted lower extremity swelling. She has early satiety due to increased abdominal girth. She denies orthopnea. There is minimal exertional dyspnea. She has not had chest pain or prolonged palpitations. She has noted a 10-15 pound weight gain over the past month. When seen in July she weighed 180 pounds. Todays weight is 190 pounds.  In the past she has had episodes of left heart failure with associated dyspnea.      Past Medical History:  Diagnosis Date  . Anemia   . Benign neoplasm of pancreas, except islets of Langerhans   . Bronchitis, mucopurulent recurrent ([place])   . Common migraine   . Diabetes mellitus ([place])   . Diverticulosis of colon   . DJD (degenerative joint disease)   . Generalized anxiety disorder   . Hypercholesterolemia   . Hypertension   . Monoclonal gammopathy   . Obesity   . OSA (obstructive sleep apnea)    severe with  AHI 37 events per hour  . Vitamin D deficiency          Past Surgical History:  Procedure Laterality Date  . ABDOMINAL HYSTERECTOMY  1972  . resection serous cystadenoma of pancreas  2004   at [facility].  Now (2017) with Dr Bridgett Larsson with [facility]    Current Medications:       Outpatient Medications Prior to Visit  Medication Sig Dispense Refill  . digoxin (LANOXIN) 0.125 MG tablet Take 0.5 tablets (0.0625 mg total) by mouth every Monday, Wednesday, and Friday. 15 tablet 5  . diltiazem (CARDIZEM CD) 240 MG 24 hr capsule Take 1 capsule (240 mg total) by mouth daily. 60 capsule 5  . ELIQUIS 2.5 MG TABS tablet TAKE 1 TABLET BY MOUTH 2 TIMES DAILY. 60 tablet 8  . ergocalciferol (VITAMIN D2) 50000 units capsule Take 50,000 Units by mouth once a week.    . furosemide (LASIX) 80 MG tablet TAKE 1 TABLET BY MOUTH TWICE A DAY 180 tablet 2  . hydrALAZINE (APRESOLINE) 25 MG tablet TAKE 1 TABLET BY MOUTH 3 TIMES DAILY 90 tablet 3  . isosorbide mononitrate (IMDUR) 60 MG 24 hr tablet TAKE 1 TABLET (60 MG TOTAL) BY MOUTH EVERY MORNING. 30 tablet 8  . Multiple Vitamin (MULTIVITAMIN WITH MINERALS) TABS tablet Take 1 tablet by mouth every morning.    . nitroGLYCERIN (NITROSTAT) 0.4 MG SL tablet Place 1 tablet (0.4 mg total) under the tongue every 5 (five) minutes as needed  for chest pain. 5 tablet 3  . OVER THE COUNTER MEDICATION Take 1 tablet by mouth daily. MED NAME: IRON    . simvastatin (ZOCOR) 40 MG tablet TAKE 1 TABLET BY MOUTH AT BEDTIME 90 tablet 1   No facility-administered medications prior to visit.      Allergies:   Piroxicam   Social History        Social History  . Marital status: Single    Spouse name: N/A  . Number of children: 2  . Years of education: N/A        Occupational History  . 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 Smoking status: Never Smoker  . Smokeless tobacco: Never Used  . Alcohol use No  . Drug  use: No  . Sexual activity: Not Asked       Other Topics Concern  . None      Social History Narrative   Retired Educational psychologist.  Single.  Granddaughter lives with her.  Ambulates independently.     Family History:  The patient's family history includes Heart Problems (age of onset: 15) in her mother; Heart Problems (age of onset: 26) in her father; Hypertension in her father and mother; Kidney disease in her other and sister; Kidney failure in her father and mother.   ROS:   Please see the history of present illness. decreased hearing, vision disturbance, cough, stable shortness of breath. PND. Lower extremity swelling. Unexplained 10 pound weight gain. All other systems reviewed and are negative.   PHYSICAL EXAM:   VS:  BP (!) 148/90   Pulse 90   Ht 5\' 5"  (1.651 m)   Wt 189 lb 6.4 oz (85.9 kg)   BMI 31.52 kg/m    GEN: Well nourished, well developed, in no acute distress  HEENT: normal  Neck: Marked JVD to the angle of the jaw bilaterally. No carotid bruits, or masses. Cardiac: RRR; no murmurs, rubs. A right ventricular S4 gallop is heard which increases in intensity with deep inspiration. There is  2-3+ edema from ankles to the knees bilaterally.  Respiratory:  clear to auscultation bilaterally, normal work of breathing GI: soft, nontender, nondistended, + BS MS: no deformity or atrophy  Skin: warm and dry, no rash Neuro:  Alert and Oriented x 3, Strength and sensation are intact Psych: euthymic mood, full affect     Wt Readings from Last 3 Encounters:  11/18/15 189 lb 6.4 oz (85.9 kg)  11/09/15 190 lb 0.3 oz (86.2 kg)  11/02/15 190 lb (86.2 kg)      Studies/Labs Reviewed:   EKG:  EKG  Sinus rhythm at 90 bpm, first-degree AV block, right bundle branch block with left anterior hemiblock, inferior and anterolateral infarcts cannot be excluded. No changes noted when compared to July 2017 tracing.   Recent Labs: 11/09/2015: ALT 14; BUN 40; Creatinine, Ser 2.15;  Hemoglobin 12.7; Platelets 333.0; Potassium 4.0; Pro B Natriuretic peptide (BNP) 1,559.0; Sodium 138   Lipid Panel Labs(Brief)          Component Value Date/Time   CHOL 185 07/30/2012 1052   TRIG 119.0 07/30/2012 1052   HDL 59.70 07/30/2012 1052   CHOLHDL 3 07/30/2012 1052   VLDL 23.8 07/30/2012 1052   LDLCALC 102 (H) 07/30/2012 1052   LDLDIRECT 142.5 08/11/2007 1021      Additional studies/ records that were reviewed today include:  Myocardial perfusion imaging 06/23/14:  Lexiscan stress.  IMPRESSION: Intermediate risk study because of moderately reduced EF (33%). No evidence for ischemia or infarction by perfusion images.  Echocardiogram 06/24/2014: Study Conclusions  - Left ventricle: The cavity size was normal. There was moderate concentric hypertrophy. Systolic function was normal. The estimated ejection fraction was in the range of 50% to 55%. Wall motion was normal; there were no regional wall motion abnormalities. Features are consistent with a pseudonormal left ventricular filling pattern, with concomitant abnormal relaxation and increased filling pressure (grade 2 diastolic dysfunction). Doppler parameters are consistent with high ventricular filling pressure. - Aortic valve: Moderate diffuse thickening and calcification, consistent with sclerosis. - Mitral valve: There was trivial regurgitation. - Right ventricle: The cavity size was moderately dilated. Wall thickness was normal. Systolic function was moderately to severely reduced. - Right atrium: The atrium was moderately dilated. - Tricuspid valve: There was moderate regurgitation. - Pulmonic valve: There was trivial regurgitation. - Pulmonary arteries: PA peak pressure: 61 mm Hg (S). - Pericardium, extracardiac: A trivial, free-flowing pericardial effusion was identified circumferential to the heart. The fluid had no internal echoes.  Impressions:  - The right  ventricular systolic pressure was increased consistent with moderate pulmonary hypertension.  ASSESSMENT:    1. Acute on chronic systolic right heart failure ([place])   2. Essential hypertension   3. CKD stage 3 due to type 2 diabetes mellitus ([place])   4. OSA (obstructive sleep apnea)   5. Right bundle branch block   6. Coronary artery disease involving native coronary artery of native heart without angina pectoris      PLAN:  In order of problems listed above:   1. The patient has clinical evidence of pulmonary hypertension and right ventricular systolic dysfunction.. There is lower extremity edema and ascites. I suspect progression of pulmonary hypertension as documented by echocardiogram in 2016. She is being admitted to the hospital for careful diuresis with IV furosemide. A nephrology consult will be obtained. She may need to have metolazone added.  Nephrology consult  IV diuresis  Close follow-up of kidney function  Repeat 2-D Doppler echocardiogram  TSH  Depending upon findings of echo, may need to have further workup and management of/ for pulmonary hypertension.  Please refer to the results of the recent paracentesis.    2. Blood pressure is adequate at this time  3. Chronic kidney disease stage III/IV which could  worsen with diuresis.  4. Obstructive sleep apnea is being treated with CPAP compliantly according to the patient.  5. Right bundle branch block and left anterior hemiblock unchanged  6. Presumed coronary disease stable without angina.   Medication Adjustments/Labs and Tests Ordered: Current medicines are reviewed at length with the patient today.  Concerns regarding medicines are outlined above.  Medication changes, Labs and Tests ordered today are listed in the Patient Instructions below. There are no Patient Instructions on file for this visit.   Signed, Sinclair Grooms, MD  11/18/2015 2:28 PM    [facility] [place], [place], [place]  [address] Phone: [phone]; Fax: [phone]

## 2015-11-18 NOTE — Telephone Encounter (Signed)
The studies suggest that the fluid in her abdomen is most likely a result of heart failure and not liver disease I think. Management is very difficult due to abnormal kidney function. Dr. Tamala Julian, her cardiologist said cardiology could assist with inpatient diuresis and management if ascites analysis indicated that was next step and I think that is the case.  I recommend that - hospialist admit vs cardiology admit. If Anderson Malta is available she saw this lady and may be able tio contact hospitalists - believe patient would have to go to ED anyway prior to admit unless cardiology would do a direct admit - needs to be admitted at [facility]

## 2015-11-18 NOTE — Patient Instructions (Signed)
Medication Instructions:  No Change  Labwork: None ordered  Testing/Procedures: None ordered  Follow-Up: Plan for an admission to [facility]. There are currently no beds available, the [facility] admissions will cal you when one becomes available. If your condition gets worse please go to the ED  Any Other Special Instructions Will Be Listed Below (If Applicable).     If you need a refill on your cardiac medications before your next appointment, please call your pharmacy.

## 2015-11-19 ENCOUNTER — Encounter (HOSPITAL_COMMUNITY): Payer: Self-pay | Admitting: Internal Medicine

## 2015-11-19 DIAGNOSIS — D631 Anemia in chronic kidney disease: Secondary | ICD-10-CM | POA: Diagnosis not present

## 2015-11-19 DIAGNOSIS — I5023 Acute on chronic systolic (congestive) heart failure: Secondary | ICD-10-CM

## 2015-11-19 DIAGNOSIS — D649 Anemia, unspecified: Secondary | ICD-10-CM | POA: Diagnosis present

## 2015-11-19 DIAGNOSIS — N2581 Secondary hyperparathyroidism of renal origin: Secondary | ICD-10-CM | POA: Diagnosis present

## 2015-11-19 DIAGNOSIS — G4733 Obstructive sleep apnea (adult) (pediatric): Secondary | ICD-10-CM | POA: Diagnosis present

## 2015-11-19 DIAGNOSIS — E1129 Type 2 diabetes mellitus with other diabetic kidney complication: Secondary | ICD-10-CM | POA: Diagnosis not present

## 2015-11-19 DIAGNOSIS — N183 Chronic kidney disease, stage 3 (moderate): Secondary | ICD-10-CM | POA: Diagnosis not present

## 2015-11-19 DIAGNOSIS — Z8249 Family history of ischemic heart disease and other diseases of the circulatory system: Secondary | ICD-10-CM | POA: Diagnosis not present

## 2015-11-19 DIAGNOSIS — E1122 Type 2 diabetes mellitus with diabetic chronic kidney disease: Secondary | ICD-10-CM | POA: Diagnosis not present

## 2015-11-19 DIAGNOSIS — I480 Paroxysmal atrial fibrillation: Secondary | ICD-10-CM | POA: Diagnosis present

## 2015-11-19 DIAGNOSIS — I10 Essential (primary) hypertension: Secondary | ICD-10-CM

## 2015-11-19 DIAGNOSIS — I272 Other secondary pulmonary hypertension: Secondary | ICD-10-CM | POA: Diagnosis present

## 2015-11-19 DIAGNOSIS — E7800 Pure hypercholesterolemia, unspecified: Secondary | ICD-10-CM | POA: Diagnosis present

## 2015-11-19 DIAGNOSIS — R188 Other ascites: Secondary | ICD-10-CM | POA: Diagnosis present

## 2015-11-19 DIAGNOSIS — Z7901 Long term (current) use of anticoagulants: Secondary | ICD-10-CM | POA: Diagnosis not present

## 2015-11-19 DIAGNOSIS — D472 Monoclonal gammopathy: Secondary | ICD-10-CM | POA: Diagnosis present

## 2015-11-19 DIAGNOSIS — I2510 Atherosclerotic heart disease of native coronary artery without angina pectoris: Secondary | ICD-10-CM | POA: Diagnosis present

## 2015-11-19 DIAGNOSIS — I4892 Unspecified atrial flutter: Secondary | ICD-10-CM | POA: Diagnosis present

## 2015-11-19 DIAGNOSIS — I5043 Acute on chronic combined systolic (congestive) and diastolic (congestive) heart failure: Secondary | ICD-10-CM | POA: Diagnosis present

## 2015-11-19 DIAGNOSIS — Z90710 Acquired absence of both cervix and uterus: Secondary | ICD-10-CM | POA: Diagnosis not present

## 2015-11-19 DIAGNOSIS — Z79899 Other long term (current) drug therapy: Secondary | ICD-10-CM | POA: Diagnosis not present

## 2015-11-19 DIAGNOSIS — I509 Heart failure, unspecified: Secondary | ICD-10-CM | POA: Diagnosis not present

## 2015-11-19 DIAGNOSIS — E669 Obesity, unspecified: Secondary | ICD-10-CM | POA: Diagnosis present

## 2015-11-19 DIAGNOSIS — Z6831 Body mass index (BMI) 31.0-31.9, adult: Secondary | ICD-10-CM | POA: Diagnosis not present

## 2015-11-19 DIAGNOSIS — I130 Hypertensive heart and chronic kidney disease with heart failure and stage 1 through stage 4 chronic kidney disease, or unspecified chronic kidney disease: Secondary | ICD-10-CM | POA: Diagnosis present

## 2015-11-19 DIAGNOSIS — I129 Hypertensive chronic kidney disease with stage 1 through stage 4 chronic kidney disease, or unspecified chronic kidney disease: Secondary | ICD-10-CM | POA: Diagnosis not present

## 2015-11-19 DIAGNOSIS — E559 Vitamin D deficiency, unspecified: Secondary | ICD-10-CM | POA: Diagnosis present

## 2015-11-19 LAB — TSH: TSH: 4.53 u[IU]/mL — AB (ref 0.350–4.500)

## 2015-11-19 LAB — RENAL FUNCTION PANEL
ALBUMIN: 3.3 g/dL — AB (ref 3.5–5.0)
Anion gap: 13 (ref 5–15)
BUN: 37 mg/dL — AB (ref 6–20)
CALCIUM: 9 mg/dL (ref 8.9–10.3)
CHLORIDE: 104 mmol/L (ref 101–111)
CO2: 23 mmol/L (ref 22–32)
CREATININE: 2.08 mg/dL — AB (ref 0.44–1.00)
GFR, EST AFRICAN AMERICAN: 24 mL/min — AB (ref >60)
GFR, EST NON AFRICAN AMERICAN: 21 mL/min — AB (ref >60)
Glucose, Bld: 95 mg/dL (ref 65–99)
Phosphorus: 3.5 mg/dL (ref 2.5–4.6)
Potassium: 3.5 mmol/L (ref 3.5–5.1)
SODIUM: 140 mmol/L (ref 135–145)

## 2015-11-19 LAB — DIGOXIN LEVEL: DIGOXIN LVL: 0.5 ng/mL — AB (ref 0.8–2.0)

## 2015-11-19 MED ORDER — DEXTROSE 5 % IV SOLN
120.0000 mg | Freq: Three times a day (TID) | INTRAVENOUS | Status: DC
  Administered 2015-11-19 – 2015-11-20 (×2): 120 mg via INTRAVENOUS
  Filled 2015-11-19 (×4): qty 12

## 2015-11-19 NOTE — Progress Notes (Signed)
[facility] Rounding Note  Subjective/Interval History:  Making urine but not an impressive amount Current lasix at 80 IV Q8H Renal function stable Not SOB, just uncomfortable from abd wall edema and ascites  Objective  Vital signs in last 24 hours: Vitals:   11/18/15 1629 11/18/15 2201 11/19/15 0207 11/19/15 0607  BP: (!) 143/80 137/84 120/70 130/76  Pulse: 88 85 68 69  Resp: 18 18 18 17   Temp: 98.6 F (37 C) 98.9 F (37.2 C) 98.2 F (36.8 C) 97.8 F (36.6 C)  TempSrc: Oral Oral Oral Oral  SpO2: 99% 99% 97% 95%  Weight: 85.7 kg (189 lb)   84.6 kg (186 lb 6.4 oz)  Height: 5\' 5"  (1.651 m)      Intake/Output Summary (Last 24 hours) at 11/19/15 0758 Last data filed at 11/19/15 [id number]  Gross per 24 hour  Intake              240 ml  Output              675 ml  Net             -435 ml   Physical Exam:  Blood pressure 130/76, pulse 69, temperature 97.8 F (36.6 C), temperature source Oral, resp. rate 17, height 5\' 5"  (1.651 m), weight 84.6 kg (186 lb 6.4 oz), SpO2 95 %.  Delightful older AAM, looks younger than her stated age of 82 Sitting up in the chair Skin: no rash, cyanosis No JVD at 30 degress Lungs are anteriorly and posteriorly clear Heart: Regular with wide split S2 and no murmur Abdomen is protuberant, + BS, + abd wall edema (not present on 09/08/15), + fluid wave, not tender Ext with 2+ pretibial edema, no thigh edema  (about same as 09/08/15) Neuro: alert, Ox3, no focal deficit AN  Labs:   Recent Labs Lab 11/18/15 1856 11/19/15 0254  NA 138 140  K 3.7 3.5  CL 104 104  CO2 20* 23  GLUCOSE 101* 95  BUN 39* 37*  CREATININE 2.20* 2.08*  CALCIUM 9.2 9.0  PHOS  --  3.5    Recent Labs Lab 11/19/15 0254  ALBUMIN 3.3*    Recent Labs Lab 11/18/15 1856  WBC 6.7  NEUTROABS 4.5  HGB 13.0  HCT 38.5  MCV 81.2  PLT 322   Medications:   . apixaban  2.5 mg Oral BID  . atorvastatin  20 mg Oral q1800  . [START ON 11/21/2015] digoxin  0.0625 mg Oral Q M,W,F  .  diltiazem  240 mg Oral QHS  . furosemide  80 mg Intravenous Q8H  . hydrALAZINE  25 mg Oral TID  . isosorbide mononitrate  60 mg Oral Daily  . sodium chloride flush  3 mL Intravenous Q12H    Background: 82 y.o. year-old AAF with PMH of HTN since age 31, DM (prev on metformin, now diet controlled), OSA (severe on CPAP), PAF, MGUS, obesity, diastolic heart failure, and CKD 3 just on the cusp of stage 4 with proteinuria, with a + FH of kidney disease (both parents, niece). Developed what she  thought to be bloating, with subsequent 10 lb weight gain over the course of 1-2 weeks, with some (but not much) worsening of LE edema.   She was referred to Dr. Carlean Purl on 9/13 who felt her ascites was due to heart failure. CT scan showed moderate ascites, abd wall edema, small effusions.  (Also noted was large mass in pancreatic body not really changed from an imaging  study from [facility] 2015 and a left adrenal mass, larger than on prior imaging studies). Diagnostic paracentesis (1.1 liters, done on the 13th) was transudative. She had become progressively uncomfortable, saw Dr. Tamala Julian in the office who felt that she had acute on chronic systolic right CHF was admitted for diuresis.  We were consulted to follow her renal function with diuresis  Assessment/Recommendations  1. Acute on chronic systolic right CHF - [id number] weight gain, ascites, worsening edema despite 80 mg lasix BID at home, creatinine of 2/GFR right around 25-30. Not a very impressive diuresis so far with 80 IV Q8H of furosemide - will increase to 120 and carefully watch renal function.   . 2. CKD 3/4 - risk factors of DM, HTN, +FH, AA  race and aging.  She sits right on the cusp of Stage 4 CKD. With diuresis adequate to relieve her edema and ascites, her renal function will undoubtedly worsen.  She has indicated that she would undergo dialysis if she had to (short OR long term) and her 2 sons have both worked in [place] had other family ON dialysis so  she has understanding of what this would mean.  3. Ascites - new issue. Transudative by studies 10/2015. S/p 1.1 Liter paracentesis 11/09/15.  (Still seems odd to me that she developed acute transudative ascites WITHOUT much change in her LE edema at all if this is all heart failure...)  4. CKD-MBD - normal PTH of 58 July 2017  5. Anemia - Hb 12.7 11/09/15. No ESA requirement.  6. OSA - CPAP  7. DM - diet controlled  8. HTN - we may need to back off on meds to give more room for diuresis but continue hydralazine and BB for now.   Jamal Maes, MD [facility] [phone] pager 11/19/2015, 7:58 AM

## 2015-11-19 NOTE — Progress Notes (Signed)
Patient Name: Wendy Kerr      SUBJECTIVE: 82 year old woman admitted 9/22 because of worsening and recurring ascites previously requiring paracentesis and thought secondary to heart failure.  She also has a history of atrial flutter which has been paroxysmal. Most recently 7/17 she has been in sinus rhythm and remained so currently. She is managed with apixoban and digoxin.  She has been treated with IV diuresis. She is being followed closely by nephrology because of chronic kidney disease "right on the cusp of stage IV "  creatinine is actually down just a little bit with diuresis.  She feels better with less abdominal bloating.  Repeat echocardiogram ordered yesterday is pending   Past Medical History:  Diagnosis Date  . 1St degree AV block   . Anemia   . Atrial flutter ([place])   . Benign neoplasm of pancreas, except islets of Langerhans   . Bronchitis, mucopurulent recurrent ([place])   . Common migraine   . Diabetes mellitus ([place])   . Diverticulosis of colon   . DJD (degenerative joint disease)   . Generalized anxiety disorder   . Hypercholesterolemia   . Hypertension   . Monoclonal gammopathy   . Obesity   . OSA (obstructive sleep apnea)    severe with AHI 37 events per hour  . Vitamin D deficiency     Scheduled Meds:  Scheduled Meds: . apixaban  2.5 mg Oral BID  . atorvastatin  20 mg Oral q1800  . [START ON 11/21/2015] digoxin  0.0625 mg Oral Q M,W,F  . diltiazem  240 mg Oral QHS  . furosemide  120 mg Intravenous Q8H  . hydrALAZINE  25 mg Oral TID  . isosorbide mononitrate  60 mg Oral Daily  . sodium chloride flush  3 mL Intravenous Q12H   Continuous Infusions:  sodium chloride, acetaminophen, ondansetron (ZOFRAN) IV, sodium chloride flush    PHYSICAL EXAM Vitals:   11/18/15 2201 11/19/15 0207 11/19/15 0607 11/19/15 1239  BP: 137/84 120/70 130/76 129/78  Pulse: 85 68 69 80  Resp: 18 18 17 20   Temp: 98.9 F (37.2 C) 98.2 F (36.8 C) 97.8 F  (36.6 C) 97.8 F (36.6 C)  TempSrc: Oral Oral Oral Oral  SpO2: 99% 97% 95% 98%  Weight:   186 lb 6.4 oz (84.6 kg)   Height:      Well developed and nourished in no acute distress HENT normal Neck supple with JVP-flat Clear Regular rate and rhythm, diminished S1  2/6 murmur   Abd-soft with active BS No Clubbing cyanosis 1+edema Skin-warm and dry A & Oriented  Grossly normal sensory and motor function  TELEMETRY: Reviewed telemetry pt in  nsr:    Intake/Output Summary (Last 24 hours) at 11/19/15 1318 Last data filed at 11/19/15 1238  Gross per 24 hour  Intake              480 ml  Output             1075 ml  Net             -595 ml    LABS: Basic Metabolic Panel:  Recent Labs Lab 11/18/15 1856 11/19/15 0254  NA 138 140  K 3.7 3.5  CL 104 104  CO2 20* 23  GLUCOSE 101* 95  BUN 39* 37*  CREATININE 2.20* 2.08*  CALCIUM 9.2 9.0  PHOS  --  3.5   Cardiac Enzymes: No results for input(s): CKTOTAL, CKMB, CKMBINDEX, TROPONINI  in the last 72 hours. CBC:  Recent Labs Lab 11/18/15 1856  WBC 6.7  NEUTROABS 4.5  HGB 13.0  HCT 38.5  MCV 81.2  PLT 322   PROTIME: No results for input(s): LABPROT, INR in the last 72 hours. Liver Function Tests:  Recent Labs  11/19/15 0254  ALBUMIN 3.3*   No results for input(s): LIPASE, AMYLASE in the last 72 hours. BNP: BNP (last 3 results) No results for input(s): BNP in the last 8760 hours.  ProBNP (last 3 results)  Recent Labs  11/09/15 1455  PROBNP 1,559.0*   Dig level 0.5   D-Dimer: No results for input(s): DDIMER in the last 72 hours. Hemoglobin A1C: No results for input(s): HGBA1C in the last 72 hours. Fasting Lipid Panel: No results for input(s): CHOL, HDL, LDLCALC, TRIG, CHOLHDL, LDLDIRECT in the last 72 hours. Thyroid Function Tests:  Recent Labs  11/19/15 0254  TSH 4.530*       ASSESSMENT AND PLAN:  Principal Problem:   Acute on chronic systolic right heart failure (HCC) Active Problems:    Essential hypertension   CKD stage 3 due to type 2 diabetes mellitus ([place])  Continue diuresis May be of some benefit to aldactone, but would worry with GFR Borderline Echo pend  Signed, Virl Axe MD  11/19/2015

## 2015-11-20 ENCOUNTER — Other Ambulatory Visit (HOSPITAL_COMMUNITY): Payer: Medicare Other

## 2015-11-20 ENCOUNTER — Inpatient Hospital Stay (HOSPITAL_COMMUNITY): Payer: Medicare Other

## 2015-11-20 DIAGNOSIS — I509 Heart failure, unspecified: Secondary | ICD-10-CM

## 2015-11-20 LAB — RENAL FUNCTION PANEL
ANION GAP: 9 (ref 5–15)
Albumin: 3.5 g/dL (ref 3.5–5.0)
BUN: 41 mg/dL — ABNORMAL HIGH (ref 6–20)
CHLORIDE: 103 mmol/L (ref 101–111)
CO2: 24 mmol/L (ref 22–32)
CREATININE: 2.12 mg/dL — AB (ref 0.44–1.00)
Calcium: 8.7 mg/dL — ABNORMAL LOW (ref 8.9–10.3)
GFR calc non Af Amer: 20 mL/min — ABNORMAL LOW (ref >60)
GFR, EST AFRICAN AMERICAN: 24 mL/min — AB (ref >60)
GLUCOSE: 102 mg/dL — AB (ref 65–99)
Phosphorus: 3.8 mg/dL (ref 2.5–4.6)
Potassium: 3.6 mmol/L (ref 3.5–5.1)
SODIUM: 136 mmol/L (ref 135–145)

## 2015-11-20 LAB — CULTURE, BODY FLUID W GRAM STAIN -BOTTLE: Culture: NO GROWTH

## 2015-11-20 LAB — ECHOCARDIOGRAM COMPLETE
HEIGHTINCHES: 65 in
Weight: 3014.4 oz

## 2015-11-20 LAB — CULTURE, BODY FLUID-BOTTLE

## 2015-11-20 MED ORDER — FUROSEMIDE 10 MG/ML IJ SOLN
160.0000 mg | Freq: Three times a day (TID) | INTRAVENOUS | Status: DC
  Administered 2015-11-20 – 2015-11-22 (×6): 160 mg via INTRAVENOUS
  Filled 2015-11-20 (×8): qty 16

## 2015-11-20 NOTE — Progress Notes (Addendum)
Patient Name: Wendy Kerr Date of Encounter: 11/20/2015  Hospital Problem List     Principal Problem:   Acute on chronic systolic right heart failure [facility]) Active Problems:   Essential hypertension   CKD stage 3 due to type 2 diabetes mellitus (HCC)   CHF (congestive heart failure) [facility])    Patient Profile     82 year old woman admitted 9/22 because of worsening and recurring ascites previously requiring paracentesis and thought secondary to heart failure.  She also has a history of atrial flutter which has been paroxysmal. Most recently 7/17 she has been in sinus rhythm and remained so currently. She is managed with apixoban and digoxin.    Subjective   She feels better and reports that her abdomen is not nearly as swollen.     Inpatient Medications    . apixaban  2.5 mg Oral BID  . atorvastatin  20 mg Oral q1800  . [START ON 11/21/2015] digoxin  0.0625 mg Oral Q M,W,F  . diltiazem  240 mg Oral QHS  . furosemide  120 mg Intravenous Q8H  . hydrALAZINE  25 mg Oral TID  . isosorbide mononitrate  60 mg Oral Daily  . sodium chloride flush  3 mL Intravenous Q12H    Vital Signs    Vitals:   11/19/15 2016 11/20/15 0052 11/20/15 0551 11/20/15 0932  BP: 128/75 116/76 111/72 114/64  Pulse: 87 78 69 77  Resp: 18 18 18    Temp: 97.9 F (36.6 C) 98 F (36.7 C) 97.9 F (36.6 C)   TempSrc: Oral Oral Oral   SpO2: 98% 100% 98% 97%  Weight:   188 lb 6.4 oz (85.5 kg)   Height:        Intake/Output Summary (Last 24 hours) at 11/20/15 1107 Last data filed at 11/20/15 1106  Gross per 24 hour  Intake              720 ml  Output             1300 ml  Net             -580 ml   Filed Weights   11/18/15 1629 11/19/15 0607 11/20/15 0551  Weight: 189 lb (85.7 kg) 186 lb 6.4 oz (84.6 kg) 188 lb 6.4 oz (85.5 kg)    Physical Exam    GEN: Well nourished, well developed, in  no acute distress.  Neck: Supple, JVD to jaw, carotid bruits, or masses. Cardiac: RRR, no rubs, or  gallops. No clubbing, cyanosis, mild edema.  Radials/DP/PT 2+ and equal bilaterally.  Respiratory:  Respirations  regular and unlabored, clear to auscultation bilaterally. GI: Soft, nontender, nondistended, BS + x 4  Neuro:  Strength and sensation are intact.   Labs    CBC  Recent Labs  11/18/15 1856  WBC 6.7  NEUTROABS 4.5  HGB 13.0  HCT 38.5  MCV 81.2  PLT [id number]   Basic Metabolic Panel  Recent Labs  11/19/15 0254 11/20/15 0210  NA 140 136  K 3.5 3.6  CL 104 103  CO2 23 24  GLUCOSE 95 102*  BUN 37* 41*  CREATININE 2.08* 2.12*  CALCIUM 9.0 8.7*  PHOS 3.5 3.8   Liver Function Tests  Recent Labs  11/19/15 0254 11/20/15 0210  ALBUMIN 3.3* 3.5   No results for input(s): LIPASE, AMYLASE in the last 72 hours. Cardiac Enzymes No results for input(s): CKTOTAL, CKMB, CKMBINDEX, TROPONINI in the last 72 hours. BNP Invalid input(s): POCBNP D-Dimer  No results for input(s): DDIMER in the last 72 hours. Hemoglobin A1C No results for input(s): HGBA1C in the last 72 hours. Fasting Lipid Panel No results for input(s): CHOL, HDL, LDLCALC, TRIG, CHOLHDL, LDLDIRECT in the last 72 hours. Thyroid Function Tests  Recent Labs  11/19/15 0254  TSH 4.530*    Telemetry    NSR, PVCs  ECG    NA  Radiology    Ct Abdomen Pelvis Wo Contrast  Result Date: 11/04/2015 CLINICAL DATA:  History of hypodense hypo enhancing mass at the junction of the head of pancreas. Outside report documents the lesion at 4.8 x 4.5 cm. Outside CT report from [facility] also documents a 3.9 cm left adrenal mass. EXAM: CT ABDOMEN AND PELVIS WITHOUT CONTRAST TECHNIQUE: Multidetector CT imaging of the abdomen and pelvis was performed following the standard protocol without IV contrast. COMPARISON:  None. FINDINGS: Lower chest: Small bilateral pleural effusions. Hepatobiliary: No focal abnormality seen in the liver on this noncontrast exam. Liver contour appears subtly nodular and left hepatic  lobe enlarged. Gallbladder unremarkable. No intrahepatic or extrahepatic biliary dilation. Pancreas: 4.8 x 5.0 cm hypo attenuating mass is identified in the body the pancreas but difficult to measure without intravenous contrast in apparent fluid between the stomach and the pancreas. Spleen: No splenomegaly. No focal mass lesion. Adrenals/Urinary Tract: Right adrenal gland unremarkable. 4.9 x 4.0 cm mass in the left adrenal gland measures larger than the previously reported measurements. Both kidneys appear atrophic. 18 mm interpolar lesion in the left kidney approaches water attenuation and is likely a cyst. No hydronephrosis. No hydroureter. The urinary bladder appears normal for the degree of distention. Stomach/Bowel: Small hiatal hernia. Stomach otherwise unremarkable. Duodenum is normally positioned as is the ligament of Treitz. No small bowel wall thickening. No small bowel dilatation. Terminal ileum not well seen. The appendix is not visualized, but there is no edema or inflammation in the region of the cecum. Diverticuli are seen scattered along the entire length of the colon without CT findings of diverticulitis. Vascular/Lymphatic: There is abdominal aortic atherosclerosis without aneurysm. No retroperitoneal lymphadenopathy. Assessment of the mesentery is degraded by motion and edema. No pelvic sidewall lymphadenopathy. Reproductive: Uterus surgically absent.  No adnexal mass. Other: Moderate volume intraperitoneal free fluid noted. Edema is seen within the mesentery in soft tissues of the pelvic floor. Musculoskeletal: Bone windows reveal no worrisome lytic or sclerotic osseous lesions. Diffuse body wall edema is evident. IMPRESSION: 1. Large hypo attenuating lesion in the body the pancreas measures similar to dimensions reported lawn outside CT scan from [facility] dated 01/29/2014. This cannot be definitively characterize today and neoplasm remains a distinct consideration.  Direct comparison to earlier imaging studies would be helpful. If the patient can receive intravenous contrast material, MRI without with contrast would be the study of choice to further evaluate. 2. 4.9 cm left adrenal mass measures larger than reported on the previous study from about 2 years ago. This does appear to have some macroscopic fat attenuation within the lesion and may represent a myelo lipoma but metastatic disease or primary adrenal neoplasm cannot be excluded. Direct comparison earlier imaging studies would prove helpful to further evaluate. 3. Imaging features suggestive of, but not definite for cirrhosis. 4. Moderate ascites.  Diffuse body wall edema associated. 5. Abdominal aortic atherosclerosis. Electronically Signed   By: Misty Stanley M.D.   On: 11/04/2015 14:04   US Paracentesis  Result Date: 11/15/2015 INDICATION: Pancreatic mass, left adrenal mass, imaging  concerning for cirrhosis, ascites. Request made for diagnostic and therapeutic paracentesis up to 1 liter. EXAM: ULTRASOUND GUIDED DIAGNOSTIC AND THERAPEUTIC PARACENTESIS MEDICATIONS: None. COMPLICATIONS: None immediate. PROCEDURE: Informed written consent was obtained from the patient after a discussion of the risks, benefits and alternatives to treatment. A timeout was performed prior to the initiation of the procedure. Initial ultrasound scanning demonstrates a small to moderate amount of ascites within the left mid to lower lower abdominal quadrant. The left mid to lower lower abdomen was prepped and draped in the usual sterile fashion. 1% lidocaine was used for local anesthesia. Following this, a Yueh catheter was introduced. An ultrasound image was saved for documentation purposes. The paracentesis was performed. The catheter was removed and a dressing was applied. The patient tolerated the procedure well without immediate post procedural complication. FINDINGS: A total of approximately 1.1 liters of yellow fluid was removed.  Samples were sent to the laboratory as requested by the clinical team. IMPRESSION: Successful ultrasound-guided diagnostic and therapeutic paracentesis yielding 1.1 liters of peritoneal fluid. Read by: Rowe Robert, PA-C Electronically Signed   By: Lucrezia Europe M.D.   On: 11/15/2015 12:46    Assessment & Plan    ACUTE ON CHRONIC SYSTOLIC HF:   Not great UO and weight is essentially unchanged despite increased Lasix.  However, she feels better!   I would like to continue the diuresis today and instructed her on how to keep her feet elevated.    HTN:  BP is OK.    CKD:   Creat is stable at 2.12.      Signed, Minus Breeding, MD  11/20/2015, 11:07 AM

## 2015-11-20 NOTE — Progress Notes (Signed)
[facility] Rounding Note  Subjective/Interval History:  Making urine but not an impressive amount Current lasix at 120 IV Q8H Renal function stable Says she is quite a bit more comfortable today  Objective  Vital signs in last 24 hours: Vitals:   11/19/15 1239 11/19/15 2016 11/20/15 0052 11/20/15 0551  BP: 129/78 128/75 116/76 111/72  Pulse: 80 87 78 69  Resp: 20 18 18 18   Temp: 97.8 F (36.6 C) 97.9 F (36.6 C) 98 F (36.7 C) 97.9 F (36.6 C)  TempSrc: Oral Oral Oral Oral  SpO2: 98% 98% 100% 98%  Weight:    85.5 kg (188 lb 6.4 oz)  Height:        Intake/Output Summary (Last 24 hours) at 11/20/15 0815 Last data filed at 11/20/15 0759  Gross per 24 hour  Intake              720 ml  Output             1300 ml  Net             -580 ml   Physical Exam:  Blood pressure 111/72, pulse 69, temperature 97.9 F (36.6 C), temperature source Oral, resp. rate 18, height 5\' 5"  (1.651 m), weight 85.5 kg (188 lb 6.4 oz), SpO2 98 %.  Delightful older AAM, looks younger than her stated age of 82 Sitting up in the chair, legs elevated and totally comfortable No JVD at 30 degress Lungs are anteriorly and posteriorly clear Heart: Regular with wide split S2 and no murmur Abdomen is protuberant, + BS, + abd wall edema (not present on 09/08/15), not tight Ext with 2+ pretibial edema, trace post thigh edema Neuro: alert, Ox3, no focal deficit  Labs:   Recent Labs Lab 11/18/15 1856 11/19/15 0254 11/20/15 0210  NA 138 140 136  K 3.7 3.5 3.6  CL 104 104 103  CO2 20* 23 24  GLUCOSE 101* 95 102*  BUN 39* 37* 41*  CREATININE 2.20* 2.08* 2.12*  CALCIUM 9.2 9.0 8.7*  PHOS  --  3.5 3.8    Recent Labs Lab 11/19/15 0254 11/20/15 0210  ALBUMIN 3.3* 3.5    Recent Labs Lab 11/18/15 1856  WBC 6.7  NEUTROABS 4.5  HGB 13.0  HCT 38.5  MCV 81.2  PLT 322   Medications:   . apixaban  2.5 mg Oral BID  . atorvastatin  20 mg Oral q1800  . [START ON 11/21/2015] digoxin  0.0625 mg Oral Q  M,W,F  . diltiazem  240 mg Oral QHS  . furosemide  120 mg Intravenous Q8H  . hydrALAZINE  25 mg Oral TID  . isosorbide mononitrate  60 mg Oral Daily  . sodium chloride flush  3 mL Intravenous Q12H    Background: 82 y.o. year-old AAF with PMH of HTN since age 4, DM (prev on metformin, now diet controlled), OSA (severe on CPAP), PAF, MGUS, obesity, diastolic heart failure, and CKD 3 just on the cusp of stage 4 with proteinuria (2gm), with a + FH of kidney disease (both parents, niece). Developed what she  thought to be bloating, with subsequent 10 lb weight gain over the course of 1-2 weeks, with some (but not much) worsening of LE edema but with worsening distension.   She was referred to Dr. Carlean Purl on 9/13 who felt her ascites was due to heart failure. CT scan showed moderate ascites, abd wall edema, small effusions.  (Also noted was large mass in pancreatic body not really  changed from an imaging study from [place] and a left adrenal mass, larger than on prior imaging studies). Diagnostic paracentesis (1.1 liters, done on the 13th) was transudative. She had become progressively uncomfortable, saw Dr. Tamala Julian in the office who felt that she had acute on chronic systolic right CHF was admitted for diuresis.  We were consulted to follow her renal function with diuresis (Primary nephrologist Lorrene Reid)  Assessment/Recommendations  1. Acute on chronic systolic right CHF - Diuretics increased from 80 po lasix at home to 120 IV TID here and not much of a diuresis although she feels better. Weight has not changed and edema hasn't either. Increase to 160 TID IV and reassess.   2.  CKD 3/4 - risk factors of DM, HTN, +FH, AA  race and aging.  She sits right on the cusp of Stage 4 CKD. With diuresis adequate to relieve her edema and ascites, her renal function may worse - stable so far but also not much in the way of diuresis either... (She has indicated that she would undergo dialysis if she had to (short OR  long term) and her 2 sons have both worked in [place] had other family ON dialysis so she has understanding of what this would mean).  3. Ascites - new issue this past month. Transudative by studies 10/2015. S/p 1.1 Liter paracentesis 11/09/15.  (Still seems odd to me that she developed acute transudative ascites WITHOUT much change in her LE edema at all if this is all heart failure...) Abd is fairly soft today, still abd wall edema.  4. CKD-MBD - normal PTH of 58 July 2017  5. Anemia - Hb 12.7 11/09/15. No ESA requirement.  6. OSA - CPAP  7. DM - diet controlled  8. HTN - we may need to back off on meds to give more room for diuresis but continue hydralazine and BB for now.   Jamal Maes, MD [facility] [phone] pager 11/20/2015, 8:15 AM

## 2015-11-20 NOTE — Progress Notes (Signed)
  Echocardiogram 2D Echocardiogram has been performed.  Jennette Dubin 11/20/2015, 3:50 PM

## 2015-11-21 LAB — RENAL FUNCTION PANEL
Albumin: 3.6 g/dL (ref 3.5–5.0)
Anion gap: 11 (ref 5–15)
BUN: 42 mg/dL — ABNORMAL HIGH (ref 6–20)
CALCIUM: 9 mg/dL (ref 8.9–10.3)
CO2: 21 mmol/L — AB (ref 22–32)
CREATININE: 1.99 mg/dL — AB (ref 0.44–1.00)
Chloride: 104 mmol/L (ref 101–111)
GFR, EST AFRICAN AMERICAN: 26 mL/min — AB (ref >60)
GFR, EST NON AFRICAN AMERICAN: 22 mL/min — AB (ref >60)
Glucose, Bld: 104 mg/dL — ABNORMAL HIGH (ref 65–99)
Phosphorus: 3.7 mg/dL (ref 2.5–4.6)
Potassium: 3.8 mmol/L (ref 3.5–5.1)
SODIUM: 136 mmol/L (ref 135–145)

## 2015-11-21 LAB — URINALYSIS, ROUTINE W REFLEX MICROSCOPIC
Bilirubin Urine: NEGATIVE
Glucose, UA: NEGATIVE mg/dL
Hgb urine dipstick: NEGATIVE
Ketones, ur: NEGATIVE mg/dL
Nitrite: NEGATIVE
Protein, ur: NEGATIVE mg/dL
Specific Gravity, Urine: 1.01 (ref 1.005–1.030)
pH: 6 (ref 5.0–8.0)

## 2015-11-21 LAB — URINE MICROSCOPIC-ADD ON: Bacteria, UA: NONE SEEN

## 2015-11-21 LAB — PROTEIN / CREATININE RATIO, URINE
CREATININE, URINE: 46.74 mg/dL
Protein Creatinine Ratio: 0.6 mg/mg{Cre} — ABNORMAL HIGH (ref 0.00–0.15)
Total Protein, Urine: 28 mg/dL

## 2015-11-21 NOTE — Progress Notes (Signed)
Subjective: Interval History: has no complaint, feels better.  Objective: Vital signs in last 24 hours: Temp:  [97.7 F (36.5 C)-98.1 F (36.7 C)] 97.7 F (36.5 C) (09/25 0540) Pulse Rate:  [68-84] 68 (09/25 0540) Resp:  [18-20] 18 (09/25 0540) BP: (106-119)/(64-74) 106/70 (09/25 0540) SpO2:  [97 %-100 %] 100 % (09/25 0540) Weight:  [85.3 kg (188 lb)] 85.3 kg (188 lb) (09/25 0540) Weight change: -0.181 kg (-6.4 oz)  Intake/Output from previous day: 09/24 0701 - 09/25 0700 In: 1221 [P.O.:1020; I.V.:3; IV Piggyback:198] Out: 2350 [Urine:2350] Intake/Output this shift: No intake/output data recorded.  General appearance: alert, cooperative, no distress and moderately obese Resp: rales bibasilar Cardio: S1, S2 normal and systolic murmur: holosystolic 2/6, blowing at apex GI: pos bs,soft,pos FW, liver down 6cm Extremities: edema 2-3+periph edema  Lab Results:  Recent Labs  11/18/15 1856  WBC 6.7  HGB 13.0  HCT 38.5  PLT 322   BMET:  Recent Labs  11/20/15 0210 11/21/15 0547  NA 136 136  K 3.6 3.8  CL 103 104  CO2 24 21*  GLUCOSE 102* 104*  BUN 41* 42*  CREATININE 2.12* 1.99*  CALCIUM 8.7* 9.0   No results for input(s): PTH in the last 72 hours. Iron Studies: No results for input(s): IRON, TIBC, TRANSFERRIN, FERRITIN in the last 72 hours.  Studies/Results: No results found.  I have reviewed the patient's current medications.  Assessment/Plan: 1 CKD 3-4 vol xs,diuresing, if stable and cont, change to po in am.  bp falling ,stop hydral. 2 Anemia stable 3HPHT check 4Obesity 5 Aflutter on rate control, anticoag. If bp low, will lower Dilt 6 DM controlled 7 OSA P stop hydral,cont IV Lasix, follow vol   LOS: 3 days   Lanier Millon L 11/21/2015,8:35 AM

## 2015-11-21 NOTE — Discharge Instructions (Signed)

## 2015-11-21 NOTE — Progress Notes (Signed)
Patient Name: Wendy Kerr Date of Encounter: 11/21/2015  Hospital Problem List     Principal Problem:   Acute on chronic systolic right heart failure [facility]) Active Problems:   Essential hypertension   CKD stage 3 due to type 2 diabetes mellitus (HCC)   CHF (congestive heart failure) [facility])    Patient Profile     82 year old woman admitted 9/22 because of worsening and recurring ascites previously requiring paracentesis and thought secondary to heart failure.  She also has a history of atrial flutter which has been paroxysmal. Most recently 7/17 she has been in sinus rhythm and remained so currently. She is managed with apixoban and digoxin.    Subjective   She feels better and reports that her abdomen is not nearly as swollen.  She ambulated twice yesterday.   Inpatient Medications    . apixaban  2.5 mg Oral BID  . atorvastatin  20 mg Oral q1800  . digoxin  0.0625 mg Oral Q M,W,F  . diltiazem  240 mg Oral QHS  . furosemide  160 mg Intravenous Q8H  . isosorbide mononitrate  60 mg Oral Daily  . sodium chloride flush  3 mL Intravenous Q12H    Vital Signs    Vitals:   11/20/15 1234 11/20/15 1546 11/20/15 2013 11/21/15 0540  BP: 119/73 118/71 117/74 106/70  Pulse: 76 80 84 68  Resp: 20  19 18   Temp: 98.1 F (36.7 C)  97.9 F (36.6 C) 97.7 F (36.5 C)  TempSrc: Oral  Oral Oral  SpO2: 100% 100% 100% 100%  Weight:    188 lb (85.3 kg)  Height:        Intake/Output Summary (Last 24 hours) at 11/21/15 0904 Last data filed at 11/21/15 0400  Gross per 24 hour  Intake              981 ml  Output             1450 ml  Net             -469 ml   Filed Weights   11/19/15 0607 11/20/15 0551 11/21/15 0540  Weight: 186 lb 6.4 oz (84.6 kg) 188 lb 6.4 oz (85.5 kg) 188 lb (85.3 kg)    Physical Exam    GEN: Well nourished, well developed, in  no acute distress.  Neck: Supple, JVD to jaw, carotid bruits, or masses. Cardiac: RRR, no rubs, or gallops. No clubbing, cyanosis,  trace edema.  Radials/DP/PT 2+ and equal bilaterally.  Respiratory:  Respirations  regular and unlabored, clear to auscultation bilaterally. GI: Soft, nontender, nondistended, BS + x 4  Neuro:  Strength and sensation are intact.   Labs    CBC  Recent Labs  11/18/15 1856  WBC 6.7  NEUTROABS 4.5  HGB 13.0  HCT 38.5  MCV 81.2  PLT [id number]   Basic Metabolic Panel  Recent Labs  11/20/15 0210 11/21/15 0547  NA 136 136  K 3.6 3.8  CL 103 104  CO2 24 21*  GLUCOSE 102* 104*  BUN 41* 42*  CREATININE 2.12* 1.99*  CALCIUM 8.7* 9.0  PHOS 3.8 3.7   Liver Function Tests  Recent Labs  11/20/15 0210 11/21/15 0547  ALBUMIN 3.5 3.6   No results for input(s): LIPASE, AMYLASE in the last 72 hours. Cardiac Enzymes No results for input(s): CKTOTAL, CKMB, CKMBINDEX, TROPONINI in the last 72 hours. BNP Invalid input(s): POCBNP D-Dimer No results for input(s): DDIMER in the last 72 hours.  Hemoglobin A1C No results for input(s): HGBA1C in the last 72 hours. Fasting Lipid Panel No results for input(s): CHOL, HDL, LDLCALC, TRIG, CHOLHDL, LDLDIRECT in the last 72 hours. Thyroid Function Tests  Recent Labs  11/19/15 0254  TSH 4.530*    Telemetry    NSR, PVCs  ECG    NA  Radiology    Ct Abdomen Pelvis Wo Contrast  Result Date: 11/04/2015 CLINICAL DATA:  History of hypodense hypo enhancing mass at the junction of the head of pancreas. Outside report documents the lesion at 4.8 x 4.5 cm. Outside CT report from [facility] also documents a 3.9 cm left adrenal mass. EXAM: CT ABDOMEN AND PELVIS WITHOUT CONTRAST TECHNIQUE: Multidetector CT imaging of the abdomen and pelvis was performed following the standard protocol without IV contrast. COMPARISON:  None. FINDINGS: Lower chest: Small bilateral pleural effusions. Hepatobiliary: No focal abnormality seen in the liver on this noncontrast exam. Liver contour appears subtly nodular and left hepatic lobe enlarged. Gallbladder  unremarkable. No intrahepatic or extrahepatic biliary dilation. Pancreas: 4.8 x 5.0 cm hypo attenuating mass is identified in the body the pancreas but difficult to measure without intravenous contrast in apparent fluid between the stomach and the pancreas. Spleen: No splenomegaly. No focal mass lesion. Adrenals/Urinary Tract: Right adrenal gland unremarkable. 4.9 x 4.0 cm mass in the left adrenal gland measures larger than the previously reported measurements. Both kidneys appear atrophic. 18 mm interpolar lesion in the left kidney approaches water attenuation and is likely a cyst. No hydronephrosis. No hydroureter. The urinary bladder appears normal for the degree of distention. Stomach/Bowel: Small hiatal hernia. Stomach otherwise unremarkable. Duodenum is normally positioned as is the ligament of Treitz. No small bowel wall thickening. No small bowel dilatation. Terminal ileum not well seen. The appendix is not visualized, but there is no edema or inflammation in the region of the cecum. Diverticuli are seen scattered along the entire length of the colon without CT findings of diverticulitis. Vascular/Lymphatic: There is abdominal aortic atherosclerosis without aneurysm. No retroperitoneal lymphadenopathy. Assessment of the mesentery is degraded by motion and edema. No pelvic sidewall lymphadenopathy. Reproductive: Uterus surgically absent.  No adnexal mass. Other: Moderate volume intraperitoneal free fluid noted. Edema is seen within the mesentery in soft tissues of the pelvic floor. Musculoskeletal: Bone windows reveal no worrisome lytic or sclerotic osseous lesions. Diffuse body wall edema is evident. IMPRESSION: 1. Large hypo attenuating lesion in the body the pancreas measures similar to dimensions reported lawn outside CT scan from [facility] dated 01/29/2014. This cannot be definitively characterize today and neoplasm remains a distinct consideration. Direct comparison to earlier  imaging studies would be helpful. If the patient can receive intravenous contrast material, MRI without with contrast would be the study of choice to further evaluate. 2. 4.9 cm left adrenal mass measures larger than reported on the previous study from about 2 years ago. This does appear to have some macroscopic fat attenuation within the lesion and may represent a myelo lipoma but metastatic disease or primary adrenal neoplasm cannot be excluded. Direct comparison earlier imaging studies would prove helpful to further evaluate. 3. Imaging features suggestive of, but not definite for cirrhosis. 4. Moderate ascites.  Diffuse body wall edema associated. 5. Abdominal aortic atherosclerosis. Electronically Signed   By: Misty Stanley M.D.   On: 11/04/2015 14:04   US Paracentesis  Result Date: 11/15/2015 INDICATION: Pancreatic mass, left adrenal mass, imaging concerning for cirrhosis, ascites. Request made for diagnostic and therapeutic  paracentesis up to 1 liter. EXAM: ULTRASOUND GUIDED DIAGNOSTIC AND THERAPEUTIC PARACENTESIS MEDICATIONS: None. COMPLICATIONS: None immediate. PROCEDURE: Informed written consent was obtained from the patient after a discussion of the risks, benefits and alternatives to treatment. A timeout was performed prior to the initiation of the procedure. Initial ultrasound scanning demonstrates a small to moderate amount of ascites within the left mid to lower lower abdominal quadrant. The left mid to lower lower abdomen was prepped and draped in the usual sterile fashion. 1% lidocaine was used for local anesthesia. Following this, a Yueh catheter was introduced. An ultrasound image was saved for documentation purposes. The paracentesis was performed. The catheter was removed and a dressing was applied. The patient tolerated the procedure well without immediate post procedural complication. FINDINGS: A total of approximately 1.1 liters of yellow fluid was removed. Samples were sent to the  laboratory as requested by the clinical team. IMPRESSION: Successful ultrasound-guided diagnostic and therapeutic paracentesis yielding 1.1 liters of peritoneal fluid. Read by: Rowe Robert, PA-C Electronically Signed   By: Lucrezia Europe M.D.   On: 11/15/2015 12:46    Assessment & Plan    ACUTE ON CHRONIC SYSTOLIC HF:   Continuing to feel better. I agree with continued IV diuresis.  Possibly home in AM on PO meds.   HTN:  BP is low.  Hydralazine stopped.    CKD:   Creat is continuing to improve.      Signed, Minus Breeding, MD  11/21/2015, 9:04 AM

## 2015-11-21 NOTE — Care Management Important Message (Signed)
Important Message  Patient Details  Name: Wendy Kerr MRN: [id number] Date of Birth: [birth year]   Medicare Important Message Given:  Yes    Anamae Rochelle Montine Circle 11/21/2015, 11:03 AM

## 2015-11-22 LAB — RENAL FUNCTION PANEL
ALBUMIN: 3.6 g/dL (ref 3.5–5.0)
ANION GAP: 10 (ref 5–15)
BUN: 47 mg/dL — ABNORMAL HIGH (ref 6–20)
CALCIUM: 9.1 mg/dL (ref 8.9–10.3)
CO2: 22 mmol/L (ref 22–32)
Chloride: 104 mmol/L (ref 101–111)
Creatinine, Ser: 1.97 mg/dL — ABNORMAL HIGH (ref 0.44–1.00)
GFR, EST AFRICAN AMERICAN: 26 mL/min — AB (ref >60)
GFR, EST NON AFRICAN AMERICAN: 22 mL/min — AB (ref >60)
GLUCOSE: 103 mg/dL — AB (ref 65–99)
PHOSPHORUS: 4.1 mg/dL (ref 2.5–4.6)
Potassium: 3.7 mmol/L (ref 3.5–5.1)
SODIUM: 136 mmol/L (ref 135–145)

## 2015-11-22 LAB — PARATHYROID HORMONE, INTACT (NO CA): PTH: 183 pg/mL — ABNORMAL HIGH (ref 15–65)

## 2015-11-22 MED ORDER — FUROSEMIDE 80 MG PO TABS
160.0000 mg | ORAL_TABLET | Freq: Three times a day (TID) | ORAL | Status: DC
  Administered 2015-11-22 – 2015-11-23 (×4): 160 mg via ORAL
  Filled 2015-11-22 (×4): qty 2

## 2015-11-22 MED ORDER — DILTIAZEM HCL ER COATED BEADS 120 MG PO CP24
120.0000 mg | ORAL_CAPSULE | Freq: Every day | ORAL | Status: DC
  Administered 2015-11-22: 120 mg via ORAL
  Filled 2015-11-22: qty 1

## 2015-11-22 MED ORDER — METOLAZONE 10 MG PO TABS
10.0000 mg | ORAL_TABLET | Freq: Every day | ORAL | Status: DC
Start: 2015-11-22 — End: 2015-11-23
  Administered 2015-11-22 – 2015-11-23 (×2): 10 mg via ORAL
  Filled 2015-11-22 (×2): qty 1

## 2015-11-22 MED ORDER — ISOSORBIDE MONONITRATE ER 30 MG PO TB24
30.0000 mg | ORAL_TABLET | Freq: Every day | ORAL | Status: DC
  Administered 2015-11-22 – 2015-11-23 (×2): 30 mg via ORAL
  Filled 2015-11-22 (×2): qty 1

## 2015-11-22 NOTE — Progress Notes (Signed)
Subjective: Interval History: has no complaint, feels better.  Objective: Vital signs in last 24 hours: Temp:  [97.4 F (36.3 C)-98.6 F (37 C)] 97.4 F (36.3 C) (09/26 0644) Pulse Rate:  [70-76] 70 (09/26 0644) Resp:  [18] 18 (09/26 0644) BP: (107-122)/(63-73) 118/69 (09/26 0644) SpO2:  [99 %-100 %] 100 % (09/26 0644) Weight:  [85.5 kg (188 lb 9.6 oz)] 85.5 kg (188 lb 9.6 oz) (09/26 0644) Weight change: 0.272 kg (9.6 oz)  Intake/Output from previous day: 09/25 0701 - 09/26 0700 In: 678 [P.O.:480; IV Piggyback:198] Out: 1150 [Urine:1150] Intake/Output this shift: No intake/output data recorded.  General appearance: alert, cooperative and no distress Resp: diminished breath sounds bilaterally and rales bibasilar Cardio: S1, S2 normal and systolic murmur: holosystolic 2/6, blowing at apex GI: obese, pos bs,liver down 5 cm, soft Extremities: edema 2-3+  Lab Results: No results for input(s): WBC, HGB, HCT, PLT in the last 72 hours. BMET:  Recent Labs  11/21/15 0547 11/22/15 0217  NA 136 136  K 3.8 3.7  CL 104 104  CO2 21* 22  GLUCOSE 104* 103*  BUN 42* 47*  CREATININE 1.99* 1.97*  CALCIUM 9.0 9.1   No results for input(s): PTH in the last 72 hours. Iron Studies: No results for input(s): IRON, TIBC, TRANSFERRIN, FERRITIN in the last 72 hours.  Studies/Results: No results found.  I have reviewed the patient's current medications.  Assessment/Plan 1 CKD 4 vol xs,slow diruesis, Cr stable, will try po diuretics including Metolazone. If ok can go within 24h. 2 CHF  3 BP falling with diuresis,lower Isosobide and dilt 4 Afib rate control and anticoag 5 Obesity 6 DM controlled P po Lasix,add Metol, lower meds    LOS: 3 days   Aiana Nordquist L 11/22/2015,8:33 AM

## 2015-11-22 NOTE — Consult Note (Signed)
   [facility] Inpatient Consult   11/22/2015  Wendy Kerr 03/05/1930 [id number]    Patient screened for potential [facility] services for HF follow up.  This is the patient's first admission in 6 months according to the medical record.  Patient is eligible for [facility] Care Management services under patient's [facility]  plan.  According to the medical record the patient is an 82 year old woman admitted 9/22 because of worsening and recurring ascites previously requiring paracentesis and thought secondary to heart failure. She also has a history of atrial flutter which has been paroxysmal. Patient has been assigned EMMI HF follow up by inpatient RNCM.  Met with the patient to give a brochure and 24 hour nurse advise line information.  Patient accepted information and was encouraged to call if she had additional post hospital follow up needs.  Patient verbalized understanding. She thanked this Probation officer for the information.  For questions, please contact:  Natividad Brood, RN BSN [facility] Liaison  [phone] business mobile phone Toll free office [phone]

## 2015-11-22 NOTE — Progress Notes (Signed)
Patient Name: Wendy Kerr Date of Encounter: 11/22/2015  Hospital Problem List     Principal Problem:   Acute on chronic systolic right heart failure [facility]) Active Problems:   Essential hypertension   CKD stage 3 due to type 2 diabetes mellitus (HCC)   CHF (congestive heart failure) [facility])    Patient Profile     82 year old woman admitted 9/22 because of worsening and recurring ascites previously requiring paracentesis and thought secondary to heart failure.  She also has a history of atrial flutter which has been paroxysmal. Most recently 7/17 she has been in sinus rhythm and remained so currently. She is managed with apixoban and digoxin.    Subjective   She feels better and is back to baseline.   Inpatient Medications    . apixaban  2.5 mg Oral BID  . atorvastatin  20 mg Oral q1800  . digoxin  0.0625 mg Oral Q M,W,F  . diltiazem  120 mg Oral QHS  . furosemide  160 mg Oral TID  . isosorbide mononitrate  30 mg Oral Daily  . metolazone  10 mg Oral Daily  . sodium chloride flush  3 mL Intravenous Q12H    Vital Signs    Vitals:   11/21/15 0928 11/21/15 1224 11/21/15 2115 11/22/15 0644  BP:  107/63 122/73 118/69  Pulse: 76 70 73 70  Resp:  18 18 18   Temp:  98.6 F (37 C) 97.7 F (36.5 C) 97.4 F (36.3 C)  TempSrc:  Oral Oral Oral  SpO2:  100% 99% 100%  Weight:    188 lb 9.6 oz (85.5 kg)  Height:        Intake/Output Summary (Last 24 hours) at 11/22/15 1010 Last data filed at 11/22/15 0915  Gross per 24 hour  Intake              798 ml  Output             1600 ml  Net             -802 ml   Filed Weights   11/20/15 0551 11/21/15 0540 11/22/15 0644  Weight: 188 lb 6.4 oz (85.5 kg) 188 lb (85.3 kg) 188 lb 9.6 oz (85.5 kg)    Physical Exam    GEN: Well nourished, well developed, in  no acute distress.  Neck: Supple, JVD to jaw, carotid bruits, or masses. Cardiac: RRR, no rubs, or gallops. No clubbing, cyanosis, trace edema.  Radials/DP/PT 2+ and equal  bilaterally.  Respiratory:  Respirations  regular and unlabored, clear to auscultation bilaterally. GI: Soft, nontender, nondistended, BS + x 4  Neuro:  Strength and sensation are intact.   Labs    CBC No results for input(s): WBC, NEUTROABS, HGB, HCT, MCV, PLT in the last 72 hours. Basic Metabolic Panel  Recent Labs  11/21/15 0547 11/22/15 0217  NA 136 136  K 3.8 3.7  CL 104 104  CO2 21* 22  GLUCOSE 104* 103*  BUN 42* 47*  CREATININE 1.99* 1.97*  CALCIUM 9.0 9.1  PHOS 3.7 4.1   Liver Function Tests  Recent Labs  11/21/15 0547 11/22/15 0217  ALBUMIN 3.6 3.6   No results for input(s): LIPASE, AMYLASE in the last 72 hours. Cardiac Enzymes No results for input(s): CKTOTAL, CKMB, CKMBINDEX, TROPONINI in the last 72 hours. BNP Invalid input(s): POCBNP D-Dimer No results for input(s): DDIMER in the last 72 hours. Hemoglobin A1C No results for input(s): HGBA1C in the last 72  hours. Fasting Lipid Panel No results for input(s): CHOL, HDL, LDLCALC, TRIG, CHOLHDL, LDLDIRECT in the last 72 hours. Thyroid Function Tests No results for input(s): TSH, T4TOTAL, T3FREE, THYROIDAB in the last 72 hours.  Invalid input(s): FREET3  Telemetry    NSR, PVCs  ECG    NA  Radiology    Ct Abdomen Pelvis Wo Contrast  Result Date: 11/04/2015 CLINICAL DATA:  History of hypodense hypo enhancing mass at the junction of the head of pancreas. Outside report documents the lesion at 4.8 x 4.5 cm. Outside CT report from [place] also documents a 3.9 cm left adrenal mass. EXAM: CT ABDOMEN AND PELVIS WITHOUT CONTRAST TECHNIQUE: Multidetector CT imaging of the abdomen and pelvis was performed following the standard protocol without IV contrast. COMPARISON:  None. FINDINGS: Lower chest: Small bilateral pleural effusions. Hepatobiliary: No focal abnormality seen in the liver on this noncontrast exam. Liver contour appears subtly nodular and left hepatic lobe enlarged. Gallbladder  unremarkable. No intrahepatic or extrahepatic biliary dilation. Pancreas: 4.8 x 5.0 cm hypo attenuating mass is identified in the body the pancreas but difficult to measure without intravenous contrast in apparent fluid between the stomach and the pancreas. Spleen: No splenomegaly. No focal mass lesion. Adrenals/Urinary Tract: Right adrenal gland unremarkable. 4.9 x 4.0 cm mass in the left adrenal gland measures larger than the previously reported measurements. Both kidneys appear atrophic. 18 mm interpolar lesion in the left kidney approaches water attenuation and is likely a cyst. No hydronephrosis. No hydroureter. The urinary bladder appears normal for the degree of distention. Stomach/Bowel: Small hiatal hernia. Stomach otherwise unremarkable. Duodenum is normally positioned as is the ligament of Treitz. No small bowel wall thickening. No small bowel dilatation. Terminal ileum not well seen. The appendix is not visualized, but there is no edema or inflammation in the region of the cecum. Diverticuli are seen scattered along the entire length of the colon without CT findings of diverticulitis. Vascular/Lymphatic: There is abdominal aortic atherosclerosis without aneurysm. No retroperitoneal lymphadenopathy. Assessment of the mesentery is degraded by motion and edema. No pelvic sidewall lymphadenopathy. Reproductive: Uterus surgically absent.  No adnexal mass. Other: Moderate volume intraperitoneal free fluid noted. Edema is seen within the mesentery in soft tissues of the pelvic floor. Musculoskeletal: Bone windows reveal no worrisome lytic or sclerotic osseous lesions. Diffuse body wall edema is evident. IMPRESSION: 1. Large hypo attenuating lesion in the body the pancreas measures similar to dimensions reported lawn outside CT scan from [facility] dated 01/29/2014. This cannot be definitively characterize today and neoplasm remains a distinct consideration. Direct comparison to earlier  imaging studies would be helpful. If the patient can receive intravenous contrast material, MRI without with contrast would be the study of choice to further evaluate. 2. 4.9 cm left adrenal mass measures larger than reported on the previous study from about 2 years ago. This does appear to have some macroscopic fat attenuation within the lesion and may represent a myelo lipoma but metastatic disease or primary adrenal neoplasm cannot be excluded. Direct comparison earlier imaging studies would prove helpful to further evaluate. 3. Imaging features suggestive of, but not definite for cirrhosis. 4. Moderate ascites.  Diffuse body wall edema associated. 5. Abdominal aortic atherosclerosis. Electronically Signed   By: Misty Stanley M.D.   On: 11/04/2015 14:04   US Paracentesis  Result Date: 11/15/2015 INDICATION: Pancreatic mass, left adrenal mass, imaging concerning for cirrhosis, ascites. Request made for diagnostic and therapeutic paracentesis up to 1 liter.  EXAM: ULTRASOUND GUIDED DIAGNOSTIC AND THERAPEUTIC PARACENTESIS MEDICATIONS: None. COMPLICATIONS: None immediate. PROCEDURE: Informed written consent was obtained from the patient after a discussion of the risks, benefits and alternatives to treatment. A timeout was performed prior to the initiation of the procedure. Initial ultrasound scanning demonstrates a small to moderate amount of ascites within the left mid to lower lower abdominal quadrant. The left mid to lower lower abdomen was prepped and draped in the usual sterile fashion. 1% lidocaine was used for local anesthesia. Following this, a Yueh catheter was introduced. An ultrasound image was saved for documentation purposes. The paracentesis was performed. The catheter was removed and a dressing was applied. The patient tolerated the procedure well without immediate post procedural complication. FINDINGS: A total of approximately 1.1 liters of yellow fluid was removed. Samples were sent to the  laboratory as requested by the clinical team. IMPRESSION: Successful ultrasound-guided diagnostic and therapeutic paracentesis yielding 1.1 liters of peritoneal fluid. Read by: Rowe Robert, PA-C Electronically Signed   By: Lucrezia Europe M.D.   On: 11/15/2015 12:46    Assessment & Plan    ACUTE ON CHRONIC SYSTOLIC HF:   Continuing to feel better. Switched to PO diuretic.    HTN:  BP is low.  Hydralazine stopped yesterday and BP OK.      CKD:   Creat is stable.    Home in AM.   Signed, Minus Breeding, MD  11/22/2015, 10:10 AM

## 2015-11-22 NOTE — Evaluation (Signed)
Physical Therapy Evaluation Patient Details Name: Wendy Kerr MRN: [id number] DOB: [birth year] Today's Date: 11/22/2015   History of Present Illness  82 year old woman admitted 9/22 because of worsening and recurring ascites previously requiring paracentesis and thought secondary to heart failure.  She also has a history of atrial flutter which has been paroxysmal.  Clinical Impression  Pt admitted with above diagnosis. Pt currently with functional limitations due to the deficits listed below (see PT Problem List). Pt was able to ambulate with supervision with pt occasionally reaching for rails in hallway.  Recommend that pt use RW at home for safety and pt somewhat in agreement.  Will need HHPT f/u. Pt will benefit from skilled PT to increase their independence and safety with mobility to allow discharge to the venue listed below.      Follow Up Recommendations Home health PT;Supervision - Intermittent    Equipment Recommendations  Rolling walker with 5" wheels    Recommendations for Other Services       Precautions / Restrictions Precautions Precautions: Fall Restrictions Weight Bearing Restrictions: No      Mobility  Bed Mobility Overal bed mobility: Independent                Transfers Overall transfer level: Independent                  Ambulation/Gait Ambulation/Gait assistance: Supervision;Min guard Ambulation Distance (Feet): 300 Feet Assistive device: None Gait Pattern/deviations: Step-through pattern;Decreased stride length;Drifts right/left   Gait velocity interpretation: Below normal speed for age/gender General Gait Details: Pt overall steady with controlled environment although pt occasionally reaching for rails in hallway.  Pt with drifting at times but no large LOB.  Pt can withstand min challenges to balance but not moderate.  Discussed that pt needs to use RW at home at d/c for safety given that she does have occasional unsteady gait.    Stairs            Wheelchair Mobility    Modified Rankin (Stroke Patients Only)       Balance Overall balance assessment: Needs assistance;History of Falls Sitting-balance support: No upper extremity supported;Feet supported Sitting balance-Leahy Scale: Normal     Standing balance support: No upper extremity supported;During functional activity Standing balance-Leahy Scale: Fair Standing balance comment: can stand statically without assist.   Single Leg Stance - Right Leg: 3 Single Leg Stance - Left Leg: 4                         Pertinent Vitals/Pain Pain Assessment: No/denies pain  VSS    Home Living Family/patient expects to be discharged to:: Private residence Living Arrangements: Alone Available Help at Discharge: Family;Available PRN/intermittently (2 sons) Type of Home: House Home Access: Stairs to enter Entrance Stairs-Rails: Right;Left;Can reach both Entrance Stairs-Number of Steps: 4 Home Layout: One level Home Equipment: None      Prior Function Level of Independence: Independent               Hand Dominance        Extremity/Trunk Assessment   Upper Extremity Assessment: Defer to OT evaluation           Lower Extremity Assessment: Overall WFL for tasks assessed      Cervical / Trunk Assessment: Normal  Communication   Communication: No difficulties  Cognition Arousal/Alertness: Awake/alert Behavior During Therapy: WFL for tasks assessed/performed Overall Cognitive Status: Within Functional Limits for tasks assessed  General Comments      Exercises     Assessment/Plan    PT Assessment Patient needs continued PT services  PT Problem List Decreased activity tolerance;Decreased balance;Decreased mobility;Decreased knowledge of use of DME;Decreased safety awareness;Decreased knowledge of precautions          PT Treatment Interventions DME instruction;Gait training;Functional  mobility training;Therapeutic activities;Therapeutic exercise;Balance training;Patient/family education    PT Goals (Current goals can be found in the Care Plan section)  Acute Rehab PT Goals Patient Stated Goal: to go home PT Goal Formulation: With patient Time For Goal Achievement: 12/06/15 Potential to Achieve Goals: Good    Frequency Min 3X/week   Barriers to discharge Decreased caregiver support      Co-evaluation               End of Session Equipment Utilized During Treatment: Gait belt Activity Tolerance: Patient limited by fatigue Patient left: in chair;with call bell/phone within reach Nurse Communication: Mobility status         Time: [id number] PT Time Calculation (min) (ACUTE ONLY): 10 min   Charges:   PT Evaluation $PT Eval Moderate Complexity: 1 Procedure     PT G CodesIrwin Brakeman F 12-16-2015, 11:08 AM Kolyn Rozario,PT Acute Rehabilitation [phone] (pager)

## 2015-11-23 ENCOUNTER — Encounter (HOSPITAL_COMMUNITY): Payer: Self-pay | Admitting: *Deleted

## 2015-11-23 ENCOUNTER — Other Ambulatory Visit: Payer: Self-pay | Admitting: Cardiology

## 2015-11-23 DIAGNOSIS — N184 Chronic kidney disease, stage 4 (severe): Secondary | ICD-10-CM

## 2015-11-23 LAB — RENAL FUNCTION PANEL
ANION GAP: 13 (ref 5–15)
Albumin: 3.5 g/dL (ref 3.5–5.0)
BUN: 54 mg/dL — ABNORMAL HIGH (ref 6–20)
CALCIUM: 9.1 mg/dL (ref 8.9–10.3)
CO2: 22 mmol/L (ref 22–32)
CREATININE: 2.3 mg/dL — AB (ref 0.44–1.00)
Chloride: 100 mmol/L — ABNORMAL LOW (ref 101–111)
GFR, EST AFRICAN AMERICAN: 21 mL/min — AB (ref >60)
GFR, EST NON AFRICAN AMERICAN: 19 mL/min — AB (ref >60)
Glucose, Bld: 113 mg/dL — ABNORMAL HIGH (ref 65–99)
PHOSPHORUS: 4.5 mg/dL (ref 2.5–4.6)
Potassium: 3.9 mmol/L (ref 3.5–5.1)
SODIUM: 135 mmol/L (ref 135–145)

## 2015-11-23 MED ORDER — ATORVASTATIN CALCIUM 20 MG PO TABS: 20.0000 mg | ORAL_TABLET | Freq: Every day | ORAL | 6 refills | Status: DC

## 2015-11-23 MED ORDER — FUROSEMIDE 80 MG PO TABS: 120.0000 mg | ORAL_TABLET | Freq: Two times a day (BID) | ORAL | 2 refills | Status: DC

## 2015-11-23 MED ORDER — DILTIAZEM HCL ER COATED BEADS 120 MG PO CP24: 120.0000 mg | ORAL_CAPSULE | Freq: Every day | ORAL | 6 refills | Status: DC

## 2015-11-23 MED ORDER — ISOSORBIDE MONONITRATE ER 60 MG PO TB24: 30.0000 mg | ORAL_TABLET | Freq: Every day | ORAL | 8 refills | Status: DC

## 2015-11-23 MED ORDER — METOLAZONE 2.5 MG PO TABS: 2.5000 mg | ORAL_TABLET | Freq: Every day | ORAL | 3 refills | Status: DC

## 2015-11-23 NOTE — Progress Notes (Signed)
Subjective: Interval History: has no complaint, feeling better.  Objective: Vital signs in last 24 hours: Temp:  [97.1 F (36.2 C)-98.1 F (36.7 C)] 97.1 F (36.2 C) (09/27 0520) Pulse Rate:  [70-73] 71 (09/27 0520) Resp:  [18] 18 (09/27 0520) BP: (110-118)/(66-74) 118/74 (09/27 0520) SpO2:  [99 %-100 %] 99 % (09/27 0520) Weight:  [84.6 kg (186 lb 8.2 oz)] 84.6 kg (186 lb 8.2 oz) (09/27 0500) Weight change: -0.948 kg (-2 lb 1.5 oz)  Intake/Output from previous day: 09/26 0701 - 09/27 0700 In: 960 [P.O.:960] Out: 2050 [Urine:2050] Intake/Output this shift: No intake/output data recorded.  General appearance: alert, cooperative, no distress and moderately obese Resp: diminished breath sounds bilaterally and rales bibasilar Cardio: S1, S2 normal and systolic murmur: holosystolic 2/6, blowing at apex GI: pos bs, obese, soft Extremities: edema 2+  Lab Results: No results for input(s): WBC, HGB, HCT, PLT in the last 72 hours. BMET:  Recent Labs  11/22/15 0217 11/23/15 0326  NA 136 135  K 3.7 3.9  CL 104 100*  CO2 22 22  GLUCOSE 103* 113*  BUN 47* 54*  CREATININE 1.97* 2.30*  CALCIUM 9.1 9.1    Recent Labs  11/21/15 1059  PTH 183*   Iron Studies: No results for input(s): IRON, TIBC, TRANSFERRIN, FERRITIN in the last 72 hours.  Studies/Results: No results found.  I have reviewed the patient's current medications.  Assessment/Plan: 1 CKD 4 diuresing, on po therapy. Cr ^ mild as anticipated 2 CHF improving 3 DM controlled 4 Obesity P cont po diuretics, can d/c from our standpoint and f/u outpatient    LOS: 4 days   Wendy Kerr L 11/23/2015,8:02 AM

## 2015-11-23 NOTE — Discharge Summary (Signed)
Discharge Summary    Patient ID: Wendy Kerr,  MRN: [id number], DOB/AGE: 07/26/1930 82 y.o.  Admit date: 11/18/2015 Discharge date: 11/23/2015  Primary Care Provider: Hoyt Koch Primary Cardiologist: Dr. Tamala Julian  Discharge Diagnoses    Principal Problem:   Acute on chronic systolic right heart failure [facility]) Active Problems:   Essential hypertension   CKD stage 3 due to type 2 diabetes mellitus (HCC)   CHF (congestive heart failure) (HCC)   Allergies Allergies  Allergen Reactions  . Piroxicam Other (See Comments)    REACTION: HEADACHE    Diagnostic Studies/Procedures    None _____________   History of Present Illness     Wendy Lance Johnsonis a 82 y.o.femalewho presented to the office on 11/18/15 for evaluation after of progressive of ascites and recent paracentesis suggest transudative effusion consistent with heart failure. Dr. Carlean Purl the gastroenterologist has asked to help manage the patient's clinical condition.  The background history is that of chronic anemia, chronic kidney disease stage III/IV, hypertension, suspected underlying coronary artery disease, type 2 diabetes mellitus, obstructive sleep apnea, PAF chronic combined systolic and diastolic HF, adrenal mass, and presumed benign pancreatic lesion noted on CT.  For the past 4-5 weeks prior to her appt, the patient has developed increasing abdominal girth. She has noted lower extremity swelling. She has early satiety due to increased abdominal girth. She denies orthopnea. There is minimal exertional dyspnea. She has not had chest pain or prolonged palpitations. She has noted a 10-15 pound weight gain over the past month. When seen in July she weighed 180 pounds. At her office appt she weighed 190 pounds.  In the past she has had episodes of left heart failure with associated dyspnea.  Hospital Course     Consultants: Nephrology  She was directly admitted from the office with plans for IV  diuresis. On admission nephrology was consulted given her hx of chronic kidney disease stage III/IV, which was expected to worsen with diuresis. She was started on IV lasix at 80mg  Q8H via Dr. Thompson Caul recommendation. Nephrology followed the patient and increased dose to 120mg  q8h>>160mg  TID IV. She did diurese, but was slow to respond to lasix.   On 9/26 she was transitioned to PO lasix at 160mg  TID with the addition of 10mg  of zaroxolyn. Cr did have a slight bump with diuresis, and peaked at 2.12, with a downward trend. On 9/27 she was reevaluated by Dr. Jimmy Footman and determined stable for discharge home, with recommendations for follow up in the outpatient setting. She had a total UOP of 2.3L and weight loss of 4lbs. Reported breathing much better, and worked with PT during this admission. On the day of discharge her Cr was noted to rise at 2.30, but was felt to be stable for discharge per nephrology with follow up Cr later this week.   Per Dr. Percival Spanish-- "at discharge she will be sent out with zaroxolyn 2.5mg  daily and 120mg  lasix BID.If her creat clearance remains where it is (19) will need to rethink Eliquis. Also, she has a reduced EF. He did not stop Cardizem and unsure why she is not on a beta blocker instead but assume that her primary cardiology dose".  Other changes noted to her medications included, changing from simvastatin to Lipitor (due to possible interaction with Cardizem), we reduced her Cardizem from 240mg  to 120mg  daily, and reduced her Imdur from 60mg  to 30mg  daily. Her hydralazine was held during this admission due to soft blood pressures.  She will need to go for a BMET on Friday to follow up on her Cr. I have arranged for a TCM appt next with an APP from Dr. Thompson Caul team. I also arranged for follow up in the office with Dr. Lorrene Reid for her first available appt in October. She was seen and assessed by Dr. Percival Spanish on 9/27 and determined stable for discharge home.   _____________  Discharge Vitals Blood pressure 118/74, pulse 71, temperature 97.1 F (36.2 C), temperature source Oral, resp. rate 18, height 5\' 5"  (1.651 m), weight 186 lb 8.2 oz (84.6 kg), SpO2 99 %.  Filed Weights   11/21/15 0540 11/22/15 0644 11/23/15 0500  Weight: 188 lb (85.3 kg) 188 lb 9.6 oz (85.5 kg) 186 lb 8.2 oz (84.6 kg)    Labs & Radiologic Studies    CBC No results for input(s): WBC, NEUTROABS, HGB, HCT, MCV, PLT in the last 72 hours. Basic Metabolic Panel  Recent Labs  11/22/15 0217 11/23/15 0326  NA 136 135  K 3.7 3.9  CL 104 100*  CO2 22 22  GLUCOSE 103* 113*  BUN 47* 54*  CREATININE 1.97* 2.30*  CALCIUM 9.1 9.1  PHOS 4.1 4.5   Liver Function Tests  Recent Labs  11/22/15 0217 11/23/15 0326  ALBUMIN 3.6 3.5   No results for input(s): LIPASE, AMYLASE in the last 72 hours. Cardiac Enzymes No results for input(s): CKTOTAL, CKMB, CKMBINDEX, TROPONINI in the last 72 hours. BNP Invalid input(s): POCBNP D-Dimer No results for input(s): DDIMER in the last 72 hours. Hemoglobin A1C No results for input(s): HGBA1C in the last 72 hours. Fasting Lipid Panel No results for input(s): CHOL, HDL, LDLCALC, TRIG, CHOLHDL, LDLDIRECT in the last 72 hours. Thyroid Function Tests No results for input(s): TSH, T4TOTAL, T3FREE, THYROIDAB in the last 72 hours.  Invalid input(s): FREET3 _____________    Disposition   Pt is being discharged home today in good condition.  Follow-up Plans & Appointments    Follow-up Information    Wendy Form, PA-C Follow up on 11/30/2015.   Specialties:  Cardiology, Radiology Why:  at 2:50 for your follow up appt in the office with Dr. Darliss Ridgel PA Joellen Jersey. Contact information: [address] [place] [id number] [phone]        Wendy Starch, MD Follow up on 12/13/2015.   Specialty:  Nephrology Why:  9:15am for your follow up appt from your hospital visit.  Contact information: [place] [address] [place] Follow up on 11/25/2015.   Why:  Please come for labs between the hours of 7:30 and 3:30.  Contact information: [place] [phone]         Discharge Instructions    (HEART FAILURE PATIENTS) Call MD:  Anytime you have any of the following symptoms: 1) 3 pound weight gain in 24 hours or 5 pounds in 1 week 2) shortness of breath, with or without a dry hacking cough 3) swelling in the hands, feet or stomach 4) if you have to sleep on extra pillows at night in order to breathe.    Complete by:  As directed    Diet - low sodium heart healthy    Complete by:  As directed    Discharge instructions    Complete by:  As directed    For patients with congestive heart failure, we give them these special  instructions:  1. Follow a low-salt diet and watch your fluid intake. In general, you should not be taking in more than 2 liters of fluid per day (no more than 8 glasses per day). Some patients are restricted to less than 1.5 liters of fluid per day (no more than 6 glasses per day). This includes sources of water in foods like soup, coffee, tea, milk, etc. 2. Weigh yourself on the same scale at same time of day and keep a log. 3. Call your doctor: (Anytime you feel any of the following symptoms)  - 3-4 pound weight gain in 1-2 days or 2 pounds overnight  - Shortness of breath, with or without a dry hacking cough  - Swelling in the hands, feet or stomach  - If you have to sleep on extra pillows at night in order to breathe   IT IS IMPORTANT TO LET YOUR DOCTOR KNOW EARLY ON IF YOU ARE HAVING SYMPTOMS SO WE CAN HELP YOU!   Increase activity slowly    Complete by:  As directed       Discharge Medications   Current Discharge Medication List    START taking these medications   Details  atorvastatin (LIPITOR) 20 MG tablet Take 1  tablet (20 mg total) by mouth daily at 6 PM. Qty: 30 tablet, Refills: 6    metolazone (ZAROXOLYN) 2.5 MG tablet Take 1 tablet (2.5 mg total) by mouth daily. Qty: 30 tablet, Refills: 3      CONTINUE these medications which have CHANGED   Details  diltiazem (CARDIZEM CD) 120 MG 24 hr capsule Take 1 capsule (120 mg total) by mouth at bedtime. Qty: 30 capsule, Refills: 6    furosemide (LASIX) 80 MG tablet Take 1.5 tablets (120 mg total) by mouth 2 (two) times daily. Qty: 180 tablet, Refills: 2    isosorbide mononitrate (IMDUR) 60 MG 24 hr tablet Take 0.5 tablets (30 mg total) by mouth daily. Qty: 30 tablet, Refills: 8      CONTINUE these medications which have NOT CHANGED   Details  digoxin (LANOXIN) 0.125 MG tablet Take 0.5 tablets (0.0625 mg total) by mouth every Monday, Wednesday, and Friday. Qty: 15 tablet, Refills: 5    ELIQUIS 2.5 MG TABS tablet TAKE 1 TABLET BY MOUTH 2 TIMES DAILY. Qty: 60 tablet, Refills: 8    ergocalciferol (VITAMIN D2) 50000 units capsule Take 50,000 Units by mouth once a week. No specific day. Usually take around the first of the week per patient    Multiple Vitamin (MULTIVITAMIN WITH MINERALS) TABS tablet Take 1 tablet by mouth every morning.    OVER THE COUNTER MEDICATION Take 1 tablet by mouth daily. MED NAME: IRON    nitroGLYCERIN (NITROSTAT) 0.4 MG SL tablet Place 1 tablet (0.4 mg total) under the tongue every 5 (five) minutes as needed for chest pain. Qty: 5 tablet, Refills: 3      STOP taking these medications     hydrALAZINE (APRESOLINE) 25 MG tablet      simvastatin (ZOCOR) 40 MG tablet           Outstanding Labs/Studies   Ordered f/u BMET for Friday 9/29  Duration of Discharge Encounter   Greater than 30 minutes including physician time.  Signed, Reino Bellis NP-C 11/23/2015, 12:15 PM

## 2015-11-23 NOTE — Progress Notes (Signed)
Patient Name: Wendy Kerr Date of Encounter: 11/23/2015  Hospital Problem List     Principal Problem:   Acute on chronic systolic right heart failure [facility]) Active Problems:   Essential hypertension   CKD stage 3 due to type 2 diabetes mellitus (HCC)   CHF (congestive heart failure) [facility])    Patient Profile     82 year old woman admitted 9/22 because of worsening and recurring ascites previously requiring paracentesis and thought secondary to heart failure.  She also has a history of atrial flutter which has been paroxysmal. Most recently 7/17 she has been in sinus rhythm and remained so currently. She is managed with apixoban and digoxin.    Subjective   She feels better and is back to baseline.   Inpatient Medications    . apixaban  2.5 mg Oral BID  . atorvastatin  20 mg Oral q1800  . digoxin  0.0625 mg Oral Q M,W,F  . diltiazem  120 mg Oral QHS  . furosemide  160 mg Oral TID  . isosorbide mononitrate  30 mg Oral Daily  . metolazone  10 mg Oral Daily  . sodium chloride flush  3 mL Intravenous Q12H    Vital Signs    Vitals:   11/22/15 1210 11/22/15 2146 11/23/15 0500 11/23/15 0520  BP: 110/66 114/73  118/74  Pulse: 70 73  71  Resp: 18 18  18   Temp: 98.1 F (36.7 C) 97.8 F (36.6 C)  97.1 F (36.2 C)  TempSrc: Oral Oral  Oral  SpO2: 100% 100%  99%  Weight:   186 lb 8.2 oz (84.6 kg)   Height:        Intake/Output Summary (Last 24 hours) at 11/23/15 0930 Last data filed at 11/23/15 0900  Gross per 24 hour  Intake              960 ml  Output             1600 ml  Net             -640 ml   Filed Weights   11/21/15 0540 11/22/15 0644 11/23/15 0500  Weight: 188 lb (85.3 kg) 188 lb 9.6 oz (85.5 kg) 186 lb 8.2 oz (84.6 kg)    Physical Exam    GEN: Well nourished, well developed, in  no acute distress.  Neck: Supple, JVD to jaw, carotid bruits, or masses. Cardiac: RRR, no rubs, or gallops. No clubbing, cyanosis, trace edema.  Radials/DP/PT 2+ and equal  bilaterally.  Respiratory:  Respirations  regular and unlabored, clear to auscultation bilaterally. GI: Soft, nontender, nondistended, BS + x 4  Neuro:  Strength and sensation are intact.   Labs    CBC No results for input(s): WBC, NEUTROABS, HGB, HCT, MCV, PLT in the last 72 hours. Basic Metabolic Panel  Recent Labs  11/22/15 0217 11/23/15 0326  NA 136 135  K 3.7 3.9  CL 104 100*  CO2 22 22  GLUCOSE 103* 113*  BUN 47* 54*  CREATININE 1.97* 2.30*  CALCIUM 9.1 9.1  PHOS 4.1 4.5   Liver Function Tests  Recent Labs  11/22/15 0217 11/23/15 0326  ALBUMIN 3.6 3.5   No results for input(s): LIPASE, AMYLASE in the last 72 hours. Cardiac Enzymes No results for input(s): CKTOTAL, CKMB, CKMBINDEX, TROPONINI in the last 72 hours. BNP Invalid input(s): POCBNP D-Dimer No results for input(s): DDIMER in the last 72 hours. Hemoglobin A1C No results for input(s): HGBA1C in the last 72  hours. Fasting Lipid Panel No results for input(s): CHOL, HDL, LDLCALC, TRIG, CHOLHDL, LDLDIRECT in the last 72 hours. Thyroid Function Tests No results for input(s): TSH, T4TOTAL, T3FREE, THYROIDAB in the last 72 hours.  Invalid input(s): FREET3   ECG    NA  Radiology    Ct Abdomen Pelvis Wo Contrast  Result Date: 11/04/2015 CLINICAL DATA:  History of hypodense hypo enhancing mass at the junction of the head of pancreas. Outside report documents the lesion at 4.8 x 4.5 cm. Outside CT report from [facility] also documents a 3.9 cm left adrenal mass. EXAM: CT ABDOMEN AND PELVIS WITHOUT CONTRAST TECHNIQUE: Multidetector CT imaging of the abdomen and pelvis was performed following the standard protocol without IV contrast. COMPARISON:  None. FINDINGS: Lower chest: Small bilateral pleural effusions. Hepatobiliary: No focal abnormality seen in the liver on this noncontrast exam. Liver contour appears subtly nodular and left hepatic lobe enlarged. Gallbladder unremarkable. No intrahepatic  or extrahepatic biliary dilation. Pancreas: 4.8 x 5.0 cm hypo attenuating mass is identified in the body the pancreas but difficult to measure without intravenous contrast in apparent fluid between the stomach and the pancreas. Spleen: No splenomegaly. No focal mass lesion. Adrenals/Urinary Tract: Right adrenal gland unremarkable. 4.9 x 4.0 cm mass in the left adrenal gland measures larger than the previously reported measurements. Both kidneys appear atrophic. 18 mm interpolar lesion in the left kidney approaches water attenuation and is likely a cyst. No hydronephrosis. No hydroureter. The urinary bladder appears normal for the degree of distention. Stomach/Bowel: Small hiatal hernia. Stomach otherwise unremarkable. Duodenum is normally positioned as is the ligament of Treitz. No small bowel wall thickening. No small bowel dilatation. Terminal ileum not well seen. The appendix is not visualized, but there is no edema or inflammation in the region of the cecum. Diverticuli are seen scattered along the entire length of the colon without CT findings of diverticulitis. Vascular/Lymphatic: There is abdominal aortic atherosclerosis without aneurysm. No retroperitoneal lymphadenopathy. Assessment of the mesentery is degraded by motion and edema. No pelvic sidewall lymphadenopathy. Reproductive: Uterus surgically absent.  No adnexal mass. Other: Moderate volume intraperitoneal free fluid noted. Edema is seen within the mesentery in soft tissues of the pelvic floor. Musculoskeletal: Bone windows reveal no worrisome lytic or sclerotic osseous lesions. Diffuse body wall edema is evident. IMPRESSION: 1. Large hypo attenuating lesion in the body the pancreas measures similar to dimensions reported lawn outside CT scan from [facility] dated 01/29/2014. This cannot be definitively characterize today and neoplasm remains a distinct consideration. Direct comparison to earlier imaging studies would be  helpful. If the patient can receive intravenous contrast material, MRI without with contrast would be the study of choice to further evaluate. 2. 4.9 cm left adrenal mass measures larger than reported on the previous study from about 2 years ago. This does appear to have some macroscopic fat attenuation within the lesion and may represent a myelo lipoma but metastatic disease or primary adrenal neoplasm cannot be excluded. Direct comparison earlier imaging studies would prove helpful to further evaluate. 3. Imaging features suggestive of, but not definite for cirrhosis. 4. Moderate ascites.  Diffuse body wall edema associated. 5. Abdominal aortic atherosclerosis. Electronically Signed   By: Misty Stanley M.D.   On: 11/04/2015 14:04   US Paracentesis  Result Date: 11/15/2015 INDICATION: Pancreatic mass, left adrenal mass, imaging concerning for cirrhosis, ascites. Request made for diagnostic and therapeutic paracentesis up to 1 liter. EXAM: ULTRASOUND GUIDED DIAGNOSTIC AND THERAPEUTIC  PARACENTESIS MEDICATIONS: None. COMPLICATIONS: None immediate. PROCEDURE: Informed written consent was obtained from the patient after a discussion of the risks, benefits and alternatives to treatment. A timeout was performed prior to the initiation of the procedure. Initial ultrasound scanning demonstrates a small to moderate amount of ascites within the left mid to lower lower abdominal quadrant. The left mid to lower lower abdomen was prepped and draped in the usual sterile fashion. 1% lidocaine was used for local anesthesia. Following this, a Yueh catheter was introduced. An ultrasound image was saved for documentation purposes. The paracentesis was performed. The catheter was removed and a dressing was applied. The patient tolerated the procedure well without immediate post procedural complication. FINDINGS: A total of approximately 1.1 liters of yellow fluid was removed. Samples were sent to the laboratory as requested by the  clinical team. IMPRESSION: Successful ultrasound-guided diagnostic and therapeutic paracentesis yielding 1.1 liters of peritoneal fluid. Read by: Rowe Robert, PA-C Electronically Signed   By: Lucrezia Europe M.D.   On: 11/15/2015 12:46    Assessment & Plan    ACUTE ON CHRONIC SYSTOLIC HF:   Continuing to feel better. Switched to PO diuretic.  OK to go home per renal and I agree.  However, I will not send her home on 10 of zaroxolyn and 160 tid Lasix.  I will reduce to 2.5 zaroxolyn and 120 bid of Lasix.  She needs a BMET on Friday and TOC follow up with Dr. Casilda Carls al next week and follow up with Dr. Lorrene Reid  HTN:  BP is low.  Hydralazine stopped and BP OK.      CKD:   Creat is increased.  Continue.    ATRIAL FIB:  Continue Eliquis for now.  However, this might need to be reconsidered if her creat stays at this level.     Signed, Minus Breeding, MD  11/23/2015, 9:30 AM

## 2015-11-23 NOTE — Progress Notes (Signed)
Orders received for pt discharge.  Discharge summary printed and reviewed with pt.  Explained medication regimen, and pt had no further questions at this time.  IV removed and site remains clean, dry, intact.  Telemetry removed.  Pt in stable condition and awaiting transport. 

## 2015-11-23 NOTE — Care Management Note (Signed)
Case Management Note  Patient Details  Name: Wendy Kerr MRN: [id number] Date of Birth: 10-10-1930  Subjective/Objective:       Admitted with CHF             Action/Plan: Patient lives at home alone, continues to drive her car and is active with her [name]; PCP is Dr Sharlet Salina; has private insurance with [facility] with prescription drug coverage; pharmacy of choice is [facility]; pt reports no problem getting her medication. She has scales at home and weighs herself daily. She also cooks a heart health diet low in sodium; Physical therapy recommends HHPT, patient refused HHC at this time. CM informed pt that if she changed her mind once she is discharged home, her PCP can make [place] arrangements from his office. No needs identified at this time.  Expected Discharge Date:   possibly 11/23/2015               Expected Discharge Plan:  [facility]  Discharge planning Services  CM Consult   Choice offered to:  Patient     [facility] Arranged:  Patient Refused    Status of Service:  In process, will continue to follow  Sherrilyn Rist [phone] 11/23/2015, 11:55 AM

## 2015-11-25 ENCOUNTER — Other Ambulatory Visit: Payer: Medicare Other | Admitting: *Deleted

## 2015-11-25 ENCOUNTER — Encounter: Payer: Self-pay | Admitting: Cardiology

## 2015-11-25 ENCOUNTER — Ambulatory Visit (INDEPENDENT_AMBULATORY_CARE_PROVIDER_SITE_OTHER): Payer: Medicare Other | Admitting: Cardiology

## 2015-11-25 ENCOUNTER — Encounter (INDEPENDENT_AMBULATORY_CARE_PROVIDER_SITE_OTHER): Payer: Self-pay

## 2015-11-25 ENCOUNTER — Other Ambulatory Visit: Payer: Self-pay | Admitting: *Deleted

## 2015-11-25 VITALS — BP 134/90 | HR 80 | Ht 65.0 in | Wt 181.4 lb

## 2015-11-25 DIAGNOSIS — E669 Obesity, unspecified: Secondary | ICD-10-CM

## 2015-11-25 DIAGNOSIS — I10 Essential (primary) hypertension: Secondary | ICD-10-CM

## 2015-11-25 DIAGNOSIS — G4733 Obstructive sleep apnea (adult) (pediatric): Secondary | ICD-10-CM | POA: Diagnosis not present

## 2015-11-25 LAB — BASIC METABOLIC PANEL
BUN: 71 mg/dL — ABNORMAL HIGH (ref 7–25)
CO2: 26 mmol/L (ref 20–31)
Calcium: 9.3 mg/dL (ref 8.6–10.4)
Chloride: 97 mmol/L — ABNORMAL LOW (ref 98–110)
Creat: 2.33 mg/dL — ABNORMAL HIGH (ref 0.60–0.88)
GLUCOSE: 120 mg/dL — AB (ref 65–99)
POTASSIUM: 3.9 mmol/L (ref 3.5–5.3)
SODIUM: 136 mmol/L (ref 135–146)

## 2015-11-25 NOTE — Patient Instructions (Signed)
Medication Instructions:  Your physician recommends that you continue on your current medications as directed. Please refer to the Current Medication list given to you today.   Labwork: None   Testing/Procedures: None  Follow-Up: Your physician wants you to follow-up in: 6 months with Dr Radford Pax. (March 2018). You will receive a reminder letter in the mail two months in advance. If you don't receive a letter, please call our office to schedule the follow-up appointment.   Any Other Special Instructions Will Be Listed Below (If Applicable).  [facility] will contact about CPAP supplies.    If you need a refill on your cardiac medications before your next appointment, please call your pharmacy.

## 2015-11-25 NOTE — Progress Notes (Signed)
Cardiology Office Note    Date:  11/25/2015   ID:  Wendy Kerr, Wendy Kerr 28-May-1930, MRN [id number]  PCP:  Hoyt Koch, MD  Cardiologist:  Fransico Him, MD   Chief Complaint  Patient presents with  . Sleep Apnea  . Hypertension    History of Present Illness:  Wendy Kerr is a 82 y.o. female who presents for followup of sleep apnea. She has severe OSA with an AHI of 37 events per hour and is on  on the BIPAP. She uses a nasal pillow mask with chin strap. She does not think that she is snoring anymore. She has some mild mouth dryness but no nasal congestion. She feels rested in the am for the most part but has to get up to go to the bathroom at night.  She  has no daytime sleepiness and does not nap.  She denies any HAs in the am.    Past Medical History:  Diagnosis Date  . 1St degree AV block   . Anemia   . Atrial flutter ([place])   . Benign neoplasm of pancreas, except islets of Langerhans   . Bronchitis, mucopurulent recurrent ([place])   . Common migraine   . Diabetes mellitus ([place])   . Diverticulosis of colon   . DJD (degenerative joint disease)   . Generalized anxiety disorder   . Hypercholesterolemia   . Hypertension   . Monoclonal gammopathy   . Obesity   . OSA (obstructive sleep apnea)    severe with AHI 37 events per hour  . Vitamin D deficiency     Past Surgical History:  Procedure Laterality Date  . ABDOMINAL HYSTERECTOMY  1972  . resection serous cystadenoma of pancreas  2004   at [facility].  Now (2017) with Dr Bridgett Larsson with [facility]    Current Medications: Outpatient Medications Prior to Visit  Medication Sig Dispense Refill  . atorvastatin (LIPITOR) 20 MG tablet Take 1 tablet (20 mg total) by mouth daily at 6 PM. 30 tablet 6  . digoxin (LANOXIN) 0.125 MG tablet Take 0.5 tablets (0.0625 mg total) by mouth every Monday, Wednesday, and Friday. 15 tablet 5  . diltiazem (CARDIZEM CD) 120 MG 24 hr capsule Take 1 capsule (120 mg total) by mouth at bedtime.  30 capsule 6  . ELIQUIS 2.5 MG TABS tablet TAKE 1 TABLET BY MOUTH 2 TIMES DAILY. 60 tablet 8  . ergocalciferol (VITAMIN D2) 50000 units capsule Take 50,000 Units by mouth once a week. No specific day. Usually take around the first of the week per patient    . furosemide (LASIX) 80 MG tablet Take 1.5 tablets (120 mg total) by mouth 2 (two) times daily. 180 tablet 2  . isosorbide mononitrate (IMDUR) 60 MG 24 hr tablet Take 0.5 tablets (30 mg total) by mouth daily. 30 tablet 8  . metolazone (ZAROXOLYN) 2.5 MG tablet Take 1 tablet (2.5 mg total) by mouth daily. 30 tablet 3  . Multiple Vitamin (MULTIVITAMIN WITH MINERALS) TABS tablet Take 1 tablet by mouth every morning.    . nitroGLYCERIN (NITROSTAT) 0.4 MG SL tablet Place 1 tablet (0.4 mg total) under the tongue every 5 (five) minutes as needed for chest pain. 5 tablet 3  . OVER THE COUNTER MEDICATION Take 1 tablet by mouth daily. MED NAME: IRON     No facility-administered medications prior to visit.      Allergies:   Piroxicam   Social History   Social History  . Marital status: Single  Spouse name: N/A  . Number of children: 2  . Years of education: N/A   Occupational History  . 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 Smoking status: Never Smoker  . Smokeless tobacco: Never Used  . Alcohol use No  . Drug use: No  . Sexual activity: Not Asked   Other Topics Concern  . 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  Ambulates independently.     Family History:  The patient's family history includes Heart Problems (age of onset: 77) in her mother; Heart Problems (age of onset: 78) in her father; Hypertension in her father and mother; Kidney disease in her other and sister; Kidney failure in her father and mother.   ROS:   Please see the history of present illness.    ROS All other systems reviewed and are negative.  No flowsheet data  found.     PHYSICAL EXAM:   VS:  BP 134/90 (BP Location: Right Arm, Patient Position: Sitting, Cuff Size: Normal)   Pulse 80   Ht 5\' 5"  (1.651 m)   Wt 181 lb 6.4 oz (82.3 kg)   BMI 30.19 kg/m    GEN: Well nourished, well developed, in no acute distress  HEENT: normal  Neck: no JVD, carotid bruits, or masses Cardiac: RRR; no murmurs, rubs, or gallops,no edema.  Intact distal pulses bilaterally.  Respiratory:  clear to auscultation bilaterally, normal work of breathing GI: soft, nontender, nondistended, + BS MS: no deformity or atrophy  Skin: warm and dry, no rash Neuro:  Alert and Oriented x 3, Strength and sensation are intact Psych: euthymic mood, full affect  Wt Readings from Last 3 Encounters:  11/25/15 181 lb 6.4 oz (82.3 kg)  11/23/15 186 lb 8.2 oz (84.6 kg)  11/18/15 189 lb 6.4 oz (85.9 kg)      Studies/Labs Reviewed:   EKG:  EKG is not ordered today.   Recent Labs: 11/09/2015: ALT 14; Pro B Natriuretic peptide (BNP) 1,559.0 11/18/2015: Hemoglobin 13.0; Platelets 322 11/19/2015: TSH 4.530 11/23/2015: BUN 54; Creatinine, Ser 2.30; Potassium 3.9; Sodium 135   Lipid Panel    Component Value Date/Time   CHOL 185 07/30/2012 1052   TRIG 119.0 07/30/2012 1052   HDL 59.70 07/30/2012 1052   CHOLHDL 3 07/30/2012 1052   VLDL 23.8 07/30/2012 1052   LDLCALC 102 (H) 07/30/2012 1052   LDLDIRECT 142.5 08/11/2007 1021    1Additional studies/ records that were reviewed today include:  CPAP download    ASSESSMENT:    1. OSA (obstructive sleep apnea)   2. Essential hypertension   3. Obesity      PLAN:  In order of problems listed above:  1.  OSA - the patient is tolerating PAP therapy well without any problems. The PAP download was reviewed today and showed an AHI of 26.4/hr on 20/16 cm H2O with 73% compliance in using more than 4 hours nightly.  The patient has been using and benefiting from PAP use and will continue to benefit from therapy. Her AHI is very high and  I am suspicious that this is due to using a nasal pillow mask in setting of high BiPAP pressures.  I am going to change her to a full face mask and autotitration for 2 weeks. 2.  HTN - BP controlled on current meds.  Continue CCB 3.  Obesity- I have encouraged  him to get into a routine exercise program and cut back on carbs and portions.    Medication Adjustments/Labs and Tests Ordered: Current medicines are reviewed at length with the patient today.  Concerns regarding medicines are outlined above.  Medication changes, Labs and Tests ordered today are listed in the Patient Instructions below.  There are no Patient Instructions on file for this visit.   Signed, Fransico Him, MD  11/25/2015 10:43 AM    [address], [place], [place]  [address] Phone: [phone]; Fax: [phone]

## 2015-11-29 ENCOUNTER — Encounter: Payer: Self-pay | Admitting: Physician Assistant

## 2015-11-29 NOTE — Progress Notes (Signed)
Cardiology Office Note    Date:  11/30/2015   ID:  Wendy, Kerr [birth year], MRN [id number]  PCP:  Hoyt Koch, MD  Cardiologist:  Dr. Tamala Julian   CC: post hospital follow up  History of Present Illness:  Wendy Kerr is a 82 y.o. female with a history of CKD, HTN, PAF on Eliquis, suspected underlying CAD, DMT2, OSA, chronic combined systolic and diastolic HF and adrenal mass who presents to clinic for post hospital follow up.   She presented to the office on 11/18/15 for evaluation after ofprogressive ascites and recent paracentesis suggesting transudativeeffusion consistent with heart failure. Dr. Carlean Purl the gastroenterologist asked Wendy Kerr tohelp manage the patient's clinical condition.  For the past 4-5 weeks prior to her appt with Dr. Tamala Julian on 11/18/15 she had developed increasing abdominal girth and lower extremity swelling. She noted a 10-15 pound weight gain. When seen in July she weighed 180 pounds. At her office appt she weighed 190 pounds.   She was directly admitted from the office with plans for IV diuresis. On admission nephrology was consulted given her hx of chronic kidney disease stage III/IV, which was expected to worsen with diuresis. She was started on IV lasix at 80mg  Q8H via Dr. Mechele Kittleson Caul recommendation. Nephrology followed the patient and increased dose to 120mg  q8h>>160mg  TID IV. She did diurese, but was slow to respond to lasix. On 9/26 she was transitioned to PO lasix at 160mg  TID with the addition of 10mg  of zaroxolyn. Cr did have a slight bump with diuresis, and peaked at 2.12, with a downward trend. On 9/27 she was reevaluated by Dr. Jimmy Footman and determined stable for discharge home, with recommendations for follow up in the outpatient setting. She had a total UOP of 2.3L and weight loss of 4lbs. Reported breathing much better, and worked with PT during this admission. On the day of discharge her Cr was noted to rise at 2.30, but was felt to be stable  for discharge per nephrology with follow up Cr later this week. Per Dr. Percival Spanish-- "at discharge she will be sent out with zaroxolyn 2.5mg  daily and 120mg  lasix BID.If her creat clearance remains where it is (19) will need to rethink Eliquis. Also, she has a reduced EF. He did not stop Cardizem and unsure why she is not on a beta blocker instead but assume that her primary cardiology dose".  Other changes noted to her medications included, changing from simvastatin to Lipitor (due to possible interaction with Cardizem), we reduced her Cardizem from 240mg  to 120mg  daily, and reduced her Imdur from 60mg  to 30mg  daily. Her hydralazine was held during this admission due to soft blood pressures.   She had a repeat BMET on 11/25/15 which showed her creat went up to 2.33 and Dr. Tamala Julian had her hold her diuretics for 1 day and then resume Lasix 80mg  BID.   Today she presents to clinic for follow up. Here with son and grandson. She is feeling much better. Since going home weight continues to go down. Her abdomen feels so much better. No LE edema, orthopnea, or PND. No dizziness or syncope. No chest pain or palpitations.     Past Medical History:  Diagnosis Date  . 1st degree AV block   . Anemia   . Atrial flutter ([place])   . Benign neoplasm of pancreas, except islets of Langerhans   . Bronchitis, mucopurulent recurrent ([place])   . Common migraine   . Diabetes mellitus ([place])   .  Diverticulosis of colon   . DJD (degenerative joint disease)   . Generalized anxiety disorder   . Hypercholesterolemia   . Hypertension   . Monoclonal gammopathy   . Obesity   . OSA (obstructive sleep apnea)    severe with AHI 37 events per hour  . Vitamin D deficiency     Past Surgical History:  Procedure Laterality Date  . ABDOMINAL HYSTERECTOMY  1972  . resection serous cystadenoma of pancreas  2004   at [facility].  Now (2017) with Dr Bridgett Larsson with [facility]    Current Medications: Outpatient Medications Prior to Visit    Medication Sig Dispense Refill  . atorvastatin (LIPITOR) 20 MG tablet Take 1 tablet (20 mg total) by mouth daily at 6 PM. 30 tablet 6  . digoxin (LANOXIN) 0.125 MG tablet Take 0.5 tablets (0.0625 mg total) by mouth every Monday, Wednesday, and Friday. 15 tablet 5  . diltiazem (CARDIZEM CD) 120 MG 24 hr capsule Take 1 capsule (120 mg total) by mouth at bedtime. 30 capsule 6  . ELIQUIS 2.5 MG TABS tablet TAKE 1 TABLET BY MOUTH 2 TIMES DAILY. 60 tablet 8  . ergocalciferol (VITAMIN D2) 50000 units capsule Take 50,000 Units by mouth once a week. No specific day. Usually take around the first of the week per patient    . isosorbide mononitrate (IMDUR) 60 MG 24 hr tablet Take 0.5 tablets (30 mg total) by mouth daily. 30 tablet 8  . Multiple Vitamin (MULTIVITAMIN WITH MINERALS) TABS tablet Take 1 tablet by mouth every morning.    . nitroGLYCERIN (NITROSTAT) 0.4 MG SL tablet Place 1 tablet (0.4 mg total) under the tongue every 5 (five) minutes as needed for chest pain. 5 tablet 3  . OVER THE COUNTER MEDICATION Take 1 tablet by mouth daily. MED NAME: IRON    . metolazone (ZAROXOLYN) 2.5 MG tablet Take 1 tablet (2.5 mg total) by mouth daily. 30 tablet 3  . furosemide (LASIX) 80 MG tablet Take 1.5 tablets (120 mg total) by mouth 2 (two) times daily. 180 tablet 2   No facility-administered medications prior to visit.      Allergies:   Piroxicam   Social History   Social History  . Marital status: Single    Spouse name: N/A  . Number of children: 2  . Years of education: N/A   Occupational History  . 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 Smoking status: Never Smoker  . Smokeless tobacco: Never Used  . Alcohol use No  . Drug use: No  . Sexual activity: Not Asked   Other Topics Concern  . 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  Ambulates independently.     Family History:  The patient's family  history includes Heart Problems (age of onset: 65) in her mother; Heart Problems (age of onset: 14) in her father; Hypertension in her father and mother; Kidney disease in her other and sister; Kidney failure in her father and mother.     ROS:   Please see the history of present illness.    ROS All other systems reviewed and are negative.   PHYSICAL EXAM:   VS:  BP (!) 142/86   Pulse 87   Ht 5\' 5"  (1.651 m)   Wt 172 lb 12.8 oz (78.4 kg)   SpO2 98%   BMI 28.76 kg/m    GEN:  Well nourished, well developed, in no acute distress  HEENT: normal  Neck: no JVD, carotid bruits, or masses Cardiac: RRR; no murmurs, rubs, or gallops,no edema  Respiratory:  clear to auscultation bilaterally, normal work of breathing GI: soft, nontender, nondistended, + BS MS: no deformity or atrophy  Skin: warm and dry, no rash Neuro:  Alert and Oriented x 3, Strength and sensation are intact Psych: euthymic mood, full affect  Wt Readings from Last 3 Encounters:  11/30/15 172 lb 12.8 oz (78.4 kg)  11/25/15 181 lb 6.4 oz (82.3 kg)  11/23/15 186 lb 8.2 oz (84.6 kg)      Studies/Labs Reviewed:   EKG:  EKG is NOT ordered today.   Recent Labs: 11/09/2015: ALT 14; Pro B Natriuretic peptide (BNP) 1,559.0 11/18/2015: Hemoglobin 13.0; Platelets 322 11/19/2015: TSH 4.530 11/25/2015: BUN 71; Creat 2.33; Potassium 3.9; Sodium 136   Lipid Panel    Component Value Date/Time   CHOL 185 07/30/2012 1052   TRIG 119.0 07/30/2012 1052   HDL 59.70 07/30/2012 1052   CHOLHDL 3 07/30/2012 1052   VLDL 23.8 07/30/2012 1052   LDLCALC 102 (H) 07/30/2012 1052   LDLDIRECT 142.5 08/11/2007 1021    Additional studies/ records that were reviewed today include:  2D ECHO: 11/20/2015 LV EF: 40% -   45% Study Conclusions - Left ventricle: The cavity size was normal. There was severe   concentric hypertrophy. Systolic function was mildly to   moderately reduced. The estimated ejection fraction was in the   range of 40% to  45%. Wall motion was normal; there were no   regional wall motion abnormalities. The study is not technically   sufficient to allow evaluation of LV diastolic function. Doppler   parameters are consistent with high ventricular filling pressure. - Mitral valve: There was mild regurgitation. - Right atrium: The atrium was moderately dilated. - Tricuspid valve: There was moderate regurgitation. - Pulmonary arteries: PA peak pressure: 55 mm Hg (S). - Pericardium, extracardiac: A small, free-flowing pericardial   effusion was identified posterior to the heart. The fluid had no   internal echoes. Impressions: - The right ventricular systolic pressure was increased consistent   with moderate pulmonary hypertension.   ASSESSMENT & PLAN:   Acute on chronic systolic CHF: currently on 2.5 zaroxolyn and 80 bid of Lasix. Discharge weight 186 --> 181lbs on 9/29. Today 172 lbs. Son has compression stockings for her as well. Continue nitrates (hyrdalaine was discontinued due to soft pressures). Not on a BB (on Cardizem for afib) but don't want to switch anything right now since she is doing so well. This can be changed at a later date.   HTN:  BP 142/86 today but runs better at home per son. Will continue current regimen.   CKD: Creat increased to 2.33 on 11/25/15 and diuretics were held and lasix decreased from 120mg  BID --> 80mg  BID. Will check BMET today. Continue to follow with nephrology    PAF:  Continue Eliquis 2.5 mg for now.  However, this might need to be reconsidered if her creat stays at this level.  CHADSVASC at least 6 (CHF, HTN, age, CAD, f sex).   HLD: continue statin  Medication Adjustments/Labs and Tests Ordered: Current medicines are reviewed at length with the patient today.  Concerns regarding medicines are outlined above.  Medication changes, Labs and Tests ordered today are listed in the Patient Instructions below. Patient Instructions  Medication Instructions:  Your  physician recommends that you continue on your current  medications as directed. Please refer to the Current Medication list given to you today.   Labwork: TODAY:  BMET  Testing/Procedures: None ordered  Follow-Up: Your physician recommends that you schedule a follow-up appointment in: 3 MONTHS Dr. Tamala Julian   Any Other Special Instructions Will Be Listed Below (If Applicable).     If you need a refill on your cardiac medications before your next appointment, please call your pharmacy.      Signed, Angelena Form, PA-C  11/30/2015 3:29 PM    [facility] [address], [place], [place]  [address] Phone: [phone]; Fax: [phone]

## 2015-11-30 ENCOUNTER — Other Ambulatory Visit: Payer: Self-pay | Admitting: Internal Medicine

## 2015-11-30 ENCOUNTER — Other Ambulatory Visit: Payer: Self-pay | Admitting: Interventional Cardiology

## 2015-11-30 ENCOUNTER — Encounter: Payer: Self-pay | Admitting: Physician Assistant

## 2015-11-30 ENCOUNTER — Other Ambulatory Visit: Payer: Self-pay

## 2015-11-30 ENCOUNTER — Ambulatory Visit (INDEPENDENT_AMBULATORY_CARE_PROVIDER_SITE_OTHER): Payer: Medicare Other | Admitting: Physician Assistant

## 2015-11-30 VITALS — BP 142/86 | HR 87 | Ht 65.0 in | Wt 172.8 lb

## 2015-11-30 DIAGNOSIS — I10 Essential (primary) hypertension: Secondary | ICD-10-CM | POA: Diagnosis not present

## 2015-11-30 DIAGNOSIS — I5022 Chronic systolic (congestive) heart failure: Secondary | ICD-10-CM | POA: Diagnosis not present

## 2015-11-30 DIAGNOSIS — I480 Paroxysmal atrial fibrillation: Secondary | ICD-10-CM

## 2015-11-30 DIAGNOSIS — N189 Chronic kidney disease, unspecified: Secondary | ICD-10-CM

## 2015-11-30 DIAGNOSIS — E785 Hyperlipidemia, unspecified: Secondary | ICD-10-CM | POA: Diagnosis not present

## 2015-11-30 LAB — BASIC METABOLIC PANEL
BUN: 74 mg/dL — AB (ref 7–25)
CALCIUM: 9.4 mg/dL (ref 8.6–10.4)
CO2: 24 mmol/L (ref 20–31)
Chloride: 98 mmol/L (ref 98–110)
Creat: 2.21 mg/dL — ABNORMAL HIGH (ref 0.60–0.88)
GLUCOSE: 118 mg/dL — AB (ref 65–99)
Potassium: 3.9 mmol/L (ref 3.5–5.3)
SODIUM: 135 mmol/L (ref 135–146)

## 2015-11-30 NOTE — Patient Instructions (Addendum)
Medication Instructions:  Your physician recommends that you continue on your current medications as directed. Please refer to the Current Medication list given to you today.   Labwork: TODAY:  BMET  Testing/Procedures: None ordered  Follow-Up: Your physician recommends that you schedule a follow-up appointment in: 3 MONTHS Dr. Tamala Julian   Any Other Special Instructions Will Be Listed Below (If Applicable).     If you need a refill on your cardiac medications before your next appointment, please call your pharmacy.

## 2015-11-30 NOTE — Patient Outreach (Signed)
[facility] Care Management  11/30/2015  Wendy Kerr 06-08-1930 ON:[id number]       EMMI-HF RED ON EMMI ALERT Day # 6 Date: 11/29/15 Red Alert Reason: "Weighed themselves today? No"    Outreach attempt #1 to patient for follow up on EMMI red alert. Spoke with patient and addressed red alert. She states that she received call prior to her having a chance to weigh. Patient voices that she is consistently monitoring weight daily in the home. She has not had a chance to weigh this morning yet as she is just getting up. She reported that weight yesterday was 170 lbs. She denies any issues with edema and/or SOB. Patient resides in her home alone. She is independent with ADLs. She had f/u appt on 11/25/15 with Dr. Fransico Him. She goes for an appt this afternoon as well. Patient states she is able to drive herself. She reports that she got all her meds filled. She states that her new script that she received at discharge for Metolazone cost her $45.00. Patient concerned about being able to afford this on a long term basis due to her fixed income and the fact that she already pays $45.00 for her Eliquis as well. Advised patient to discuss this with MD during appt today to address options of samples of med or possibly switching to cheaper med. She voiced understanding and will do so. She was able to get all meds filled and has 30 day supply of meds currently. She voiced no other RN CM needs or concerns at this time. Advised patient that she would continue to get automated EMMI HF daily calls for duration of 45 day program and receive a call from a nurse if any of her responses are abnormal. She voiced understanding and was appreciative of call.      Plan: RN CM will notify [facility] administrative assistant of case closure.   Enzo Montgomery, RN,BSN,CCM [place] Management Telephonic Care Management Coordinator Direct Phone: [phone] Toll Free: [phone] Fax:  [phone]

## 2015-12-05 ENCOUNTER — Encounter: Payer: Self-pay | Admitting: Cardiology

## 2015-12-18 ENCOUNTER — Ambulatory Visit (HOSPITAL_COMMUNITY)
Admission: EM | Admit: 2015-12-18 | Discharge: 2015-12-18 | Disposition: A | Discharge status: Nursing Home (Includes ICF) | Payer: Medicare Other | Attending: Family Medicine | Admitting: Family Medicine

## 2015-12-18 ENCOUNTER — Encounter (HOSPITAL_COMMUNITY): Payer: Self-pay

## 2015-12-18 ENCOUNTER — Inpatient Hospital Stay (HOSPITAL_COMMUNITY)
Admission: EM | Admit: 2015-12-18 | Discharge: 2016-01-01 | DRG: 329 | Disposition: A | Discharge status: Home Health | Payer: Medicare Other | Attending: Internal Medicine | Admitting: Internal Medicine

## 2015-12-18 ENCOUNTER — Emergency Department (HOSPITAL_COMMUNITY): Payer: Medicare Other

## 2015-12-18 ENCOUNTER — Encounter (HOSPITAL_COMMUNITY): Payer: Self-pay | Admitting: Emergency Medicine

## 2015-12-18 DIAGNOSIS — I130 Hypertensive heart and chronic kidney disease with heart failure and stage 1 through stage 4 chronic kidney disease, or unspecified chronic kidney disease: Secondary | ICD-10-CM | POA: Diagnosis present

## 2015-12-18 DIAGNOSIS — R112 Nausea with vomiting, unspecified: Secondary | ICD-10-CM

## 2015-12-18 DIAGNOSIS — Z841 Family history of disorders of kidney and ureter: Secondary | ICD-10-CM

## 2015-12-18 DIAGNOSIS — Z0189 Encounter for other specified special examinations: Secondary | ICD-10-CM

## 2015-12-18 DIAGNOSIS — E1169 Type 2 diabetes mellitus with other specified complication: Secondary | ICD-10-CM | POA: Diagnosis present

## 2015-12-18 DIAGNOSIS — K56609 Unspecified intestinal obstruction, unspecified as to partial versus complete obstruction: Secondary | ICD-10-CM | POA: Diagnosis not present

## 2015-12-18 DIAGNOSIS — K55029 Acute infarction of small intestine, extent unspecified: Secondary | ICD-10-CM | POA: Diagnosis present

## 2015-12-18 DIAGNOSIS — N183 Chronic kidney disease, stage 3 (moderate): Secondary | ICD-10-CM | POA: Diagnosis present

## 2015-12-18 DIAGNOSIS — A09 Infectious gastroenteritis and colitis, unspecified: Secondary | ICD-10-CM | POA: Diagnosis not present

## 2015-12-18 DIAGNOSIS — G4733 Obstructive sleep apnea (adult) (pediatric): Secondary | ICD-10-CM | POA: Diagnosis present

## 2015-12-18 DIAGNOSIS — N179 Acute kidney failure, unspecified: Secondary | ICD-10-CM | POA: Diagnosis not present

## 2015-12-18 DIAGNOSIS — R1084 Generalized abdominal pain: Secondary | ICD-10-CM

## 2015-12-18 DIAGNOSIS — E785 Hyperlipidemia, unspecified: Secondary | ICD-10-CM | POA: Diagnosis present

## 2015-12-18 DIAGNOSIS — M6281 Muscle weakness (generalized): Secondary | ICD-10-CM

## 2015-12-18 DIAGNOSIS — Z01810 Encounter for preprocedural cardiovascular examination: Secondary | ICD-10-CM

## 2015-12-18 DIAGNOSIS — I4892 Unspecified atrial flutter: Secondary | ICD-10-CM | POA: Diagnosis present

## 2015-12-18 DIAGNOSIS — R110 Nausea: Secondary | ICD-10-CM | POA: Diagnosis present

## 2015-12-18 DIAGNOSIS — E1122 Type 2 diabetes mellitus with diabetic chronic kidney disease: Secondary | ICD-10-CM | POA: Diagnosis present

## 2015-12-18 DIAGNOSIS — D72829 Elevated white blood cell count, unspecified: Secondary | ICD-10-CM | POA: Diagnosis present

## 2015-12-18 DIAGNOSIS — E7800 Pure hypercholesterolemia, unspecified: Secondary | ICD-10-CM | POA: Diagnosis present

## 2015-12-18 DIAGNOSIS — R188 Other ascites: Secondary | ICD-10-CM | POA: Diagnosis present

## 2015-12-18 DIAGNOSIS — E876 Hypokalemia: Secondary | ICD-10-CM | POA: Diagnosis present

## 2015-12-18 DIAGNOSIS — I272 Pulmonary hypertension, unspecified: Secondary | ICD-10-CM | POA: Diagnosis present

## 2015-12-18 DIAGNOSIS — I10 Essential (primary) hypertension: Secondary | ICD-10-CM | POA: Diagnosis present

## 2015-12-18 DIAGNOSIS — F411 Generalized anxiety disorder: Secondary | ICD-10-CM | POA: Diagnosis present

## 2015-12-18 DIAGNOSIS — E860 Dehydration: Secondary | ICD-10-CM | POA: Diagnosis present

## 2015-12-18 DIAGNOSIS — I5022 Chronic systolic (congestive) heart failure: Secondary | ICD-10-CM | POA: Diagnosis present

## 2015-12-18 DIAGNOSIS — E119 Type 2 diabetes mellitus without complications: Secondary | ICD-10-CM | POA: Diagnosis present

## 2015-12-18 DIAGNOSIS — Z90710 Acquired absence of both cervix and uterus: Secondary | ICD-10-CM

## 2015-12-18 DIAGNOSIS — Z7901 Long term (current) use of anticoagulants: Secondary | ICD-10-CM

## 2015-12-18 DIAGNOSIS — K529 Noninfective gastroenteritis and colitis, unspecified: Secondary | ICD-10-CM | POA: Diagnosis present

## 2015-12-18 DIAGNOSIS — Z8249 Family history of ischemic heart disease and other diseases of the circulatory system: Secondary | ICD-10-CM

## 2015-12-18 DIAGNOSIS — E669 Obesity, unspecified: Secondary | ICD-10-CM | POA: Diagnosis present

## 2015-12-18 DIAGNOSIS — T460X1A Poisoning by cardiac-stimulant glycosides and drugs of similar action, accidental (unintentional), initial encounter: Secondary | ICD-10-CM

## 2015-12-18 DIAGNOSIS — I480 Paroxysmal atrial fibrillation: Secondary | ICD-10-CM | POA: Diagnosis present

## 2015-12-18 DIAGNOSIS — I5042 Chronic combined systolic (congestive) and diastolic (congestive) heart failure: Secondary | ICD-10-CM | POA: Diagnosis present

## 2015-12-18 LAB — URINALYSIS, ROUTINE W REFLEX MICROSCOPIC
BILIRUBIN URINE: NEGATIVE
GLUCOSE, UA: NEGATIVE mg/dL
KETONES UR: NEGATIVE mg/dL
LEUKOCYTES UA: NEGATIVE
Nitrite: NEGATIVE
PROTEIN: 100 mg/dL — AB
Specific Gravity, Urine: 1.011 (ref 1.005–1.030)
pH: 5.5 (ref 5.0–8.0)

## 2015-12-18 LAB — COMPREHENSIVE METABOLIC PANEL
ALT: 27 U/L (ref 14–54)
AST: 45 U/L — AB (ref 15–41)
Albumin: 4.6 g/dL (ref 3.5–5.0)
Alkaline Phosphatase: 130 U/L — ABNORMAL HIGH (ref 38–126)
Anion gap: 21 — ABNORMAL HIGH (ref 5–15)
BILIRUBIN TOTAL: 1 mg/dL (ref 0.3–1.2)
BUN: 118 mg/dL — AB (ref 6–20)
CHLORIDE: 92 mmol/L — AB (ref 101–111)
CO2: 19 mmol/L — ABNORMAL LOW (ref 22–32)
CREATININE: 2.54 mg/dL — AB (ref 0.44–1.00)
Calcium: 9.6 mg/dL (ref 8.9–10.3)
GFR calc Af Amer: 19 mL/min — ABNORMAL LOW (ref >60)
GFR, EST NON AFRICAN AMERICAN: 16 mL/min — AB (ref >60)
GLUCOSE: 199 mg/dL — AB (ref 65–99)
Potassium: 4.9 mmol/L (ref 3.5–5.1)
Sodium: 132 mmol/L — ABNORMAL LOW (ref 135–145)
Total Protein: 7.9 g/dL (ref 6.5–8.1)

## 2015-12-18 LAB — LIPASE, BLOOD: Lipase: 48 U/L (ref 11–51)

## 2015-12-18 LAB — CBC
HCT: 41.9 % (ref 36.0–46.0)
Hemoglobin: 15 g/dL (ref 12.0–15.0)
MCH: 27.8 pg (ref 26.0–34.0)
MCHC: 35.8 g/dL (ref 30.0–36.0)
MCV: 77.7 fL — AB (ref 78.0–100.0)
PLATELETS: 328 10*3/uL (ref 150–400)
RBC: 5.39 MIL/uL — ABNORMAL HIGH (ref 3.87–5.11)
RDW: 14.7 % (ref 11.5–15.5)
WBC: 8.5 10*3/uL (ref 4.0–10.5)

## 2015-12-18 LAB — I-STAT CG4 LACTIC ACID, ED
LACTIC ACID, VENOUS: 1.16 mmol/L (ref 0.5–1.9)
Lactic Acid, Venous: 2.24 mmol/L (ref 0.5–1.9)

## 2015-12-18 LAB — POC OCCULT BLOOD, ED: Fecal Occult Bld: NEGATIVE

## 2015-12-18 LAB — URINE MICROSCOPIC-ADD ON

## 2015-12-18 LAB — CBG MONITORING, ED: GLUCOSE-CAPILLARY: 229 mg/dL — AB (ref 65–99)

## 2015-12-18 MED ORDER — HYDROMORPHONE HCL 2 MG/ML IJ SOLN: 0.5000 mg | INTRAMUSCULAR | Status: DC | PRN

## 2015-12-18 MED ORDER — ONDANSETRON HCL 4 MG/2ML IJ SOLN
4.0000 mg | Freq: Once | INTRAMUSCULAR | Status: AC
Start: 2015-12-18 — End: 2015-12-18
  Administered 2015-12-18: 4 mg via INTRAVENOUS
  Filled 2015-12-18: qty 2

## 2015-12-18 MED ORDER — ONDANSETRON HCL 4 MG/2ML IJ SOLN
4.0000 mg | Freq: Once | INTRAMUSCULAR | Status: AC | PRN
  Administered 2015-12-18: 4 mg via INTRAVENOUS
  Filled 2015-12-18: qty 2

## 2015-12-18 MED ORDER — DILTIAZEM HCL ER COATED BEADS 120 MG PO CP24
120.0000 mg | ORAL_CAPSULE | Freq: Every day | ORAL | Status: DC
  Administered 2015-12-19 – 2015-12-20 (×2): 120 mg via ORAL
  Filled 2015-12-18 (×2): qty 1

## 2015-12-18 MED ORDER — FENTANYL CITRATE (PF) 100 MCG/2ML IJ SOLN
100.0000 ug | Freq: Once | INTRAMUSCULAR | Status: AC
  Administered 2015-12-18: 100 ug via INTRAVENOUS
  Filled 2015-12-18: qty 2

## 2015-12-18 MED ORDER — APIXABAN 2.5 MG PO TABS
2.5000 mg | ORAL_TABLET | Freq: Two times a day (BID) | ORAL | Status: DC
  Administered 2015-12-19 – 2015-12-21 (×5): 2.5 mg via ORAL
  Filled 2015-12-18 (×5): qty 1

## 2015-12-18 MED ORDER — ONDANSETRON HCL 4 MG/2ML IJ SOLN
4.0000 mg | Freq: Four times a day (QID) | INTRAMUSCULAR | Status: DC | PRN
  Administered 2015-12-19 – 2015-12-21 (×6): 4 mg via INTRAVENOUS
  Filled 2015-12-18 (×6): qty 2

## 2015-12-18 MED ORDER — SODIUM CHLORIDE 0.9 % IV BOLUS (SEPSIS)
500.0000 mL | Freq: Once | INTRAVENOUS | Status: AC
  Administered 2015-12-18: 500 mL via INTRAVENOUS

## 2015-12-18 MED ORDER — SODIUM CHLORIDE 0.9 % IV SOLN
INTRAVENOUS | Status: DC
  Administered 2015-12-19 (×2): via INTRAVENOUS

## 2015-12-18 MED ORDER — ATORVASTATIN CALCIUM 10 MG PO TABS
20.0000 mg | ORAL_TABLET | Freq: Every day | ORAL | Status: DC
  Administered 2015-12-19 – 2015-12-20 (×2): 20 mg via ORAL
  Filled 2015-12-18 (×2): qty 2

## 2015-12-18 MED ORDER — ONDANSETRON HCL 4 MG PO TABS: 4.0000 mg | ORAL_TABLET | Freq: Four times a day (QID) | ORAL | Status: DC | PRN

## 2015-12-18 MED ORDER — ISOSORBIDE MONONITRATE ER 30 MG PO TB24
30.0000 mg | ORAL_TABLET | Freq: Every day | ORAL | Status: DC
  Administered 2015-12-19 – 2015-12-21 (×3): 30 mg via ORAL
  Filled 2015-12-18 (×3): qty 1

## 2015-12-18 MED ORDER — ONDANSETRON 4 MG PO TBDP
ORAL_TABLET | ORAL | Status: AC
  Filled 2015-12-18: qty 1

## 2015-12-18 MED ORDER — HYDROMORPHONE HCL 2 MG/ML IJ SOLN
0.5000 mg | INTRAMUSCULAR | Status: DC | PRN
  Administered 2015-12-19 (×3): 0.5 mg via INTRAVENOUS
  Filled 2015-12-18 (×3): qty 1

## 2015-12-18 MED ORDER — ADULT MULTIVITAMIN W/MINERALS CH
1.0000 | ORAL_TABLET | Freq: Every morning | ORAL | Status: DC
  Administered 2015-12-19: 1 via ORAL
  Filled 2015-12-18 (×3): qty 1

## 2015-12-18 MED ORDER — DIGOXIN 125 MCG PO TABS
0.0625 mg | ORAL_TABLET | ORAL | Status: DC
  Administered 2015-12-19 – 2015-12-21 (×2): 0.0625 mg via ORAL
  Filled 2015-12-18 (×2): qty 0.5

## 2015-12-18 MED ORDER — HYDROMORPHONE HCL 2 MG/ML IJ SOLN
0.5000 mg | INTRAMUSCULAR | Status: DC | PRN
  Administered 2015-12-18: 0.5 mg via INTRAVENOUS
  Filled 2015-12-18: qty 1

## 2015-12-18 NOTE — Discharge Instructions (Signed)
Transfer to ED immediately

## 2015-12-18 NOTE — ED Triage Notes (Signed)
The patient presented to the [facility] with a complaint of an acute on set of abdominal pain and cramping. The patient stated that she has not had fried foods in some time and she ate some fried chicken livers today around 1:00pm and has had abdominal pain since.

## 2015-12-18 NOTE — ED Notes (Signed)
Patient returned from CT

## 2015-12-18 NOTE — H&P (Signed)
History and Physical    Wendy Kerr [id number] DOB: September 17, 1930 DOA: 12/18/2015   PCP: Hoyt Koch, MD Chief Complaint:  Chief Complaint  Patient presents with  . Abdominal Pain  . Emesis    HPI: Wendy Kerr is a 82 y.o. female with medical history significant of CHF with EF 40-45%, CKD.  Patient presents to the ED with c/o abdominal pain, N/V.  Abdominal pain is throughout the abdomen and especially in lower abdomen.  Symptoms are severe.  No fever, chills, diarrhea, dysuria.  No blood in vomit or BM.  50lb weight loss over last couple of months.  Recent admit and treatment for CHF.  Today she ate fried chicken from a gas station and symptoms onset shortly there after.  Nothing makes symptoms better or worse.  ED Course: CT abd pelvis suggestive of enteritis, no contrast used due to kidney function, lactate trending down and is now WNL on recheck, pain control achieved with IV dilaudid.  Creatinine is 2.5 (essentially unchanged from recent labs), BUN is up slightly from a month ago now at 118.  Review of Systems: As per HPI otherwise 10 point review of systems negative.    Past Medical History:  Diagnosis Date  . 1st degree AV block   . Anemia   . Atrial flutter ([place])   . Benign neoplasm of pancreas, except islets of Langerhans   . Bronchitis, mucopurulent recurrent ([place])   . Common migraine   . Diabetes mellitus ([place])   . Diverticulosis of colon   . DJD (degenerative joint disease)   . Generalized anxiety disorder   . Hypercholesterolemia   . Hypertension   . Monoclonal gammopathy   . Obesity   . OSA (obstructive sleep apnea)    severe with AHI 37 events per hour  . Vitamin D deficiency     Past Surgical History:  Procedure Laterality Date  . ABDOMINAL HYSTERECTOMY  1972  . resection serous cystadenoma of pancreas  2004   at [facility].  Now (2017) with Dr Bridgett Larsson with [facility]     reports that she has never smoked. She has never used smokeless tobacco.  She reports that she does not drink alcohol or use drugs.  Allergies  Allergen Reactions  . Piroxicam Other (See Comments)    REACTION: HEADACHE    Family History  Problem Relation Age of Onset  . Hypertension Mother   . Kidney failure Mother     dialysis  . Heart Problems Mother 50  . Hypertension Father   . Kidney failure Father     dialysis  . Heart Problems Father 64  . Kidney disease Sister   . Kidney disease Other       Prior to Admission medications   Medication Sig Start Date End Date Taking? Authorizing Provider  atorvastatin (LIPITOR) 20 MG tablet Take 1 tablet (20 mg total) by mouth daily at 6 PM. 11/23/15   Cheryln Manly, NP  digoxin (LANOXIN) 0.125 MG tablet Take 0.5 tablets (0.0625 mg total) by mouth every Monday, Wednesday, and Friday. 09/26/15   Belva Crome, MD  diltiazem (CARDIZEM CD) 120 MG 24 hr capsule Take 1 capsule (120 mg total) by mouth at bedtime. 11/23/15   Cheryln Manly, NP  ELIQUIS 2.5 MG TABS tablet TAKE 1 TABLET BY MOUTH 2 TIMES DAILY. 07/18/15   Belva Crome, MD  ergocalciferol (VITAMIN D2) 50000 units capsule Take 50,000 Units by mouth once a week. No specific day. Usually take  around the first of the week per patient    Historical Provider, MD  furosemide (LASIX) 80 MG tablet Take 80 mg by mouth 2 (two) times daily.    Historical Provider, MD  isosorbide mononitrate (IMDUR) 60 MG 24 hr tablet Take 0.5 tablets (30 mg total) by mouth daily. 11/23/15   Cheryln Manly, NP  metolazone (ZAROXOLYN) 2.5 MG tablet Take 2.5 mg by mouth daily.    Historical Provider, MD  Multiple Vitamin (MULTIVITAMIN WITH MINERALS) TABS tablet Take 1 tablet by mouth every morning.    Historical Provider, MD  nitroGLYCERIN (NITROSTAT) 0.4 MG SL tablet Place 1 tablet (0.4 mg total) under the tongue every 5 (five) minutes as needed for chest pain. 08/13/14   Hoyt Koch, MD  OVER THE COUNTER MEDICATION Take 1 tablet by mouth daily. MED NAME: IRON    Historical  Provider, MD    Physical Exam: Vitals:   12/18/15 2130 12/18/15 2200 12/18/15 2230 12/18/15 2330  BP: (!) 129/103 135/89 158/90 149/92  Pulse: 73 78 77 82  Resp: 18 18 18    Temp:      TempSrc:      SpO2: 100% (!) 81% 94% 96%      Constitutional: NAD, calm, comfortable Eyes: PERRL, lids and conjunctivae normal ENMT: Mucous membranes are moist. Posterior pharynx clear of any exudate or lesions.Normal dentition.  Neck: normal, supple, no masses, no thyromegaly Respiratory: clear to auscultation bilaterally, no wheezing, no crackles. Normal respiratory effort. No accessory muscle use.  Cardiovascular: Regular rate and rhythm, no murmurs / rubs / gallops. No extremity edema. 2+ pedal pulses. No carotid bruits.  Abdomen: no tenderness, no masses palpated. No hepatosplenomegaly. Bowel sounds positive.  Musculoskeletal: no clubbing / cyanosis. No joint deformity upper and lower extremities. Good ROM, no contractures. Normal muscle tone.  Skin: no rashes, lesions, ulcers. No induration Neurologic: CN 2-12 grossly intact. Sensation intact, DTR normal. Strength 5/5 in all 4.  Psychiatric: Normal judgment and insight. Alert and oriented x 3. Normal mood.    Labs on Admission: I have personally reviewed following labs and imaging studies  CBC:  Recent Labs Lab 12/18/15 1900  WBC 8.5  HGB 15.0  HCT 41.9  MCV 77.7*  PLT [id number]   Basic Metabolic Panel:  Recent Labs Lab 12/18/15 1900  NA 132*  K 4.9  CL 92*  CO2 19*  GLUCOSE 199*  BUN 118*  CREATININE 2.54*  CALCIUM 9.6   GFR: CrCl cannot be calculated (Unknown ideal weight.). Liver Function Tests:  Recent Labs Lab 12/18/15 1900  AST 45*  ALT 27  ALKPHOS 130*  BILITOT 1.0  PROT 7.9  ALBUMIN 4.6    Recent Labs Lab 12/18/15 1900  LIPASE 48   No results for input(s): AMMONIA in the last 168 hours. Coagulation Profile: No results for input(s): INR, PROTIME in the last 168 hours. Cardiac Enzymes: No results for  input(s): CKTOTAL, CKMB, CKMBINDEX, TROPONINI in the last 168 hours. BNP (last 3 results)  Recent Labs  11/09/15 1455  PROBNP 1,559.0*   HbA1C: No results for input(s): HGBA1C in the last 72 hours. CBG:  Recent Labs Lab 12/18/15 2354  GLUCAP 229*   Lipid Profile: No results for input(s): CHOL, HDL, LDLCALC, TRIG, CHOLHDL, LDLDIRECT in the last 72 hours. Thyroid Function Tests: No results for input(s): TSH, T4TOTAL, FREET4, T3FREE, THYROIDAB in the last 72 hours. Anemia Panel: No results for input(s): VITAMINB12, FOLATE, FERRITIN, TIBC, IRON, RETICCTPCT in the last 72 hours. Urine analysis:  Component Value Date/Time   COLORURINE YELLOW 12/18/2015 2239   APPEARANCEUR CLEAR 12/18/2015 2239   LABSPEC 1.011 12/18/2015 2239   PHURINE 5.5 12/18/2015 2239   GLUCOSEU NEGATIVE 12/18/2015 2239   GLUCOSEU Color Interference (A) 08/15/2015 1245   HGBUR SMALL (A) 12/18/2015 2239   BILIRUBINUR NEGATIVE 12/18/2015 2239   KETONESUR NEGATIVE 12/18/2015 2239   PROTEINUR 100 (A) 12/18/2015 2239   UROBILINOGEN Color Interference (A) 08/15/2015 1245   NITRITE NEGATIVE 12/18/2015 2239   LEUKOCYTESUR NEGATIVE 12/18/2015 2239   Sepsis Labs: @LABRCNTIP (procalcitonin:4,lacticidven:4) )No results found for this or any previous visit (from the past 240 hour(s)).   Radiological Exams on Admission: Ct Abdomen Pelvis Wo Contrast  Result Date: 12/18/2015 CLINICAL DATA:  82 year old female with acute abdominal pain. History of pancreatic mass. Patient feels nauseous. EXAM: CT ABDOMEN AND PELVIS WITHOUT CONTRAST TECHNIQUE: Multidetector CT imaging of the abdomen and pelvis was performed following the standard protocol without IV contrast. COMPARISON:  CT dated 11/14/2015 and ultrasound dated 11/15/2015 FINDINGS: Evaluation of this exam is limited in the absence of intravenous contrast. Lower chest: Minimal left lung base atelectatic changes. Cardiomegaly with partially visualized small pericardial  effusion. No intra-abdominal free air. Small ascites, decreased from prior study. Hepatobiliary: Irregularity of the hepatic contour may represent underlying cirrhosis. Clinical correlation is recommended. No intrahepatic biliary ductal dilatation. Mild periportal edema. The gallbladder is unremarkable. Pancreas: There is 4.5 x 5.0 cm (previously 4.8 x 5.0 cm) low attenuating mass in the body of the pancreas. There is no associated gland atrophy or dilatation of the main pancreatic duct. Spleen: Normal in size without focal abnormality. Adrenals/Urinary Tract: There is a 4.8 x 4.1 cm low attenuating lesion arising from the left adrenal gland. This lesion demonstrates macroscopic fat and may represent myelo lipoma. Other etiologies are not excluded. This is similar to the prior CT. The right adrenal gland appears unremarkable. There is mild bilateral renal atrophy. A 1.5 cm left renal interpolar hypodense lesion is not well characterized but likely represents a cyst. There is mild right hydronephrosis. A 5 mm calculus along the course of the proximal right ureter appears in stable positioning as the prior CT and likely represents vascular calcification. No definite renal or ureteral calculi identified. There is no hydronephrosis on the left. The urinary bladder is unremarkable. Stomach/Bowel: There is extensive sigmoid and colonic diverticulosis without active inflammatory changes. No evidence of bowel obstruction. Multiple nondilated fluid-filled loops of small bowel throughout the abdomen may be physiologic or represent enteritis. The appendix is not visualized with certainty. No inflammatory changes identified in the right lower quadrant. Vascular/Lymphatic: There is moderate aortoiliac atherosclerotic disease. The abdominal aorta and IVC are otherwise grossly unremarkable on this noncontrast study. No portal venous gas identified. There is no adenopathy. Reproductive: Hysterectomy. Other: Midline vertical  anterior pelvic wall incisional scar. Mild diffuse subcutaneous stranding and edema. Musculoskeletal: Multilevel degenerative changes of the spine. Multilevel disc desiccation with vacuum phenomena. No acute fracture. IMPRESSION: Nondistended fluid-filled loops of small bowel may represent enteritis. Clinical correlation is recommended. No bowel obstruction. Colonic diverticulosis without active inflammatory changes. Hypo attenuating mass in the body of the pancreas similar to the prior CT. No gland atrophy or ductal dilatation. 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 Other etiologies are not excluded. Mild right hydronephrosis no stone identified. Correlation with urinalysis recommended to exclude UTI. Small ascites decreased from prior study. Possible cirrhosis.  Clinical correlation is recommended. Electronically Signed   By: Laren Everts.D.  On: 12/18/2015 22:42    EKG: Independently reviewed.  Assessment/Plan Principal Problem:   Enteritis presumed infectious Active Problems:   Diabetes mellitus type 2 in obese [facility])   Essential hypertension   CKD stage 3 due to type 2 diabetes mellitus (HCC)   AKI (acute kidney injury) (HCC)   Nausea and vomiting    1. Enteritis presumed infectious - 1. Hold diuretics 2. Gentle hydration with IVF 3. Repeat CBC, BMP in am 4. Trend lactates but given that these are already trending down (now WNL) I doubt that this represents acute mesenteric ischemia / ischemic enteritis / dead bowel 5. zofran PRN, dilaudid PRN 6. NPO 7. If fever or WBC then would start ABx 8. Did have paracentesis for ascites a couple of weeks back, but absolutely no SIRS and no abdominal TTP at the moment (much less peritoneal signs) to suggest SBP.  Now has very little ascites. 2. DM2 - appears to be diet controlled, Q4H BMPs 3. HTN - continue home meds 4. CHF - holding diuretics and gently hydrating 5. AKI on CKD - BUN elevation noted, hydrating and  recheck BMP in AM   DVT prophylaxis: Eliquis Code Status: Full Family Communication: Son at bedside Consults called: None Admission status: Admit to obs   GARDNER, [place] [facility] Pager [phone] from 7PM-7AM  If 7AM-7PM, please contact the day physician for the patient [email or web address] Password [email or web address]  12/18/2015, 11:56 PM

## 2015-12-18 NOTE — ED Provider Notes (Signed)
[place]    CSN: [id number] Arrival date & time: 12/18/15  1510     History   Chief Complaint Chief Complaint  Patient presents with  . Abdominal Pain    HPI Wendy Kerr is a 82 y.o. female.   HPI Patient presents with the above complaint.  Increased abd pain that started today.  Hx significant for large pancreatic mass and had US paracentesis about a month ago and 1.1 liters of fluid was removed.  Currently patient feels nauseous.  Pain worsening.  No bowel movement today.   Past Medical History:  Diagnosis Date  . 1st degree AV block   . Anemia   . Atrial flutter ([place])   . Benign neoplasm of pancreas, except islets of Langerhans   . Bronchitis, mucopurulent recurrent ([place])   . Common migraine   . Diabetes mellitus ([place])   . Diverticulosis of colon   . DJD (degenerative joint disease)   . Generalized anxiety disorder   . Hypercholesterolemia   . Hypertension   . Monoclonal gammopathy   . Obesity   . OSA (obstructive sleep apnea)    severe with AHI 37 events per hour  . Vitamin D deficiency     Patient Active Problem List   Diagnosis Date Noted  . CHF (congestive heart failure) ([place]) 11/19/2015  . Acute on chronic systolic right heart failure ([place]) 11/18/2015  . Abdominal bloating 11/03/2015  . Viral URI with cough 07/02/2015  . Routine general medical examination at a health care facility 02/11/2015  . Vitamin D deficiency 11/30/2014  . Anemia, iron deficiency 11/30/2014  . Atrial flutter ([place]) 06/28/2014  . Right bundle branch block 06/22/2014  . CKD stage 3 due to type 2 diabetes mellitus ([place]) 06/21/2014  . OSA (obstructive sleep apnea)   . GERD (gastroesophageal reflux disease) 01/01/2014  . Chronic diastolic heart failure ([place]) 01/21/2013    Class: Acute  . BEN NEOPLASM PANCREAS EXCEPT ISLETS LANGERHANS 08/16/2007  . Diabetes mellitus type 2 in obese ([place]) 02/10/2007  . HYPERCHOLESTEROLEMIA 02/10/2007  . MONOCLONAL GAMMOPATHY  02/10/2007  . Obesity 02/10/2007  . Essential hypertension 02/10/2007  . ANXIETY DISORDER, GENERALIZED 12/07/2006  . DEGENERATIVE JOINT DISEASE 12/07/2006    Past Surgical History:  Procedure Laterality Date  . ABDOMINAL HYSTERECTOMY  1972  . resection serous cystadenoma of pancreas  2004   at [facility].  Now (2017) with Dr Bridgett Larsson with [facility]    OB History    No data available       Home Medications    Prior to Admission medications   Medication Sig Start Date End Date Taking? Authorizing Provider  atorvastatin (LIPITOR) 20 MG tablet Take 1 tablet (20 mg total) by mouth daily at 6 PM. 11/23/15  Yes Cheryln Manly, NP  digoxin (LANOXIN) 0.125 MG tablet Take 0.5 tablets (0.0625 mg total) by mouth every Monday, Wednesday, and Friday. 09/26/15  Yes Belva Crome, MD  diltiazem (CARDIZEM CD) 120 MG 24 hr capsule Take 1 capsule (120 mg total) by mouth at bedtime. 11/23/15  Yes Cheryln Manly, NP  ELIQUIS 2.5 MG TABS tablet TAKE 1 TABLET BY MOUTH 2 TIMES DAILY. 07/18/15  Yes Belva Crome, MD  furosemide (LASIX) 80 MG tablet Take 80 mg by mouth 2 (two) times daily.   Yes Historical Provider, MD  isosorbide mononitrate (IMDUR) 60 MG 24 hr tablet Take 0.5 tablets (30 mg total) by mouth daily. 11/23/15  Yes Cheryln Manly, NP  ergocalciferol (VITAMIN  D2) 50000 units capsule Take 50,000 Units by mouth once a week. No specific day. Usually take around the first of the week per patient    Historical Provider, MD  metolazone (ZAROXOLYN) 2.5 MG tablet Take 2.5 mg by mouth daily.    Historical Provider, MD  Multiple Vitamin (MULTIVITAMIN WITH MINERALS) TABS tablet Take 1 tablet by mouth every morning.    Historical Provider, MD  nitroGLYCERIN (NITROSTAT) 0.4 MG SL tablet Place 1 tablet (0.4 mg total) under the tongue every 5 (five) minutes as needed for chest pain. 08/13/14   Hoyt Koch, MD  OVER THE COUNTER MEDICATION Take 1 tablet by mouth daily. MED NAME: IRON    Historical Provider, MD      Family History Family History  Problem Relation Age of Onset  . Hypertension Mother   . Kidney failure Mother     dialysis  . Heart Problems Mother 41  . Hypertension Father   . Kidney failure Father     dialysis  . Heart Problems Father 72  . Kidney disease Sister   . Kidney disease Other     Social History Social History  Substance Use Topics  . Smoking status: Never Smoker  . Smokeless tobacco: Never Used  . Alcohol use No     Allergies   Piroxicam   Review of Systems Review of Systems  Constitutional: Positive for activity change. Negative for fever.  HENT: Negative.   Respiratory: Negative for shortness of breath.   Cardiovascular: Negative for chest pain.  Gastrointestinal: Positive for nausea. Negative for blood in stool.  Genitourinary: Negative.   Psychiatric/Behavioral: Negative.      Physical Exam Triage Vital Signs ED Triage Vitals  Enc Vitals Group     BP 12/18/15 1721 139/90     Pulse Rate 12/18/15 1721 79     Resp 12/18/15 1721 20     Temp 12/18/15 1721 97.4 F (36.3 C)     Temp Source 12/18/15 1721 Oral     SpO2 12/18/15 1721 99 %     Weight --      Height --      Head Circumference --      Peak Flow --      Pain Score 12/18/15 1724 9     Pain Loc --      Pain Edu? --      Excl. in [place]? --    No data found.   Updated Vital Signs BP 139/90 (BP Location: Right Arm)   Pulse 79   Temp 97.4 F (36.3 C) (Oral)   Resp 20   SpO2 99%   Visual Acuity Right Eye Distance:   Left Eye Distance:   Bilateral Distance:    Right Eye Near:   Left Eye Near:    Bilateral Near:     Physical Exam  Constitutional: She appears distressed.  HENT:  Head: Normocephalic and atraumatic.  Eyes: EOM are normal. Pupils are equal, round, and reactive to light.  Neck: Normal range of motion.  Cardiovascular: Regular rhythm.   Pulmonary/Chest: Effort normal.  Abdominal: She exhibits distension. There is tenderness.  Skin: Skin is warm. She is  not diaphoretic.     UC Treatments / Results  Labs (all labs ordered are listed, but only abnormal results are displayed) Labs Reviewed - No data to display  EKG  EKG Interpretation None       Radiology No results found.  Procedures Procedures (including critical care time)  Medications Ordered  in UC Medications - No data to display   Initial Impression / Assessment and Plan / UC Course  I have reviewed the triage vital signs and the nursing notes.  Pertinent labs & imaging results that were available during my care of the patient were reviewed by me and considered in my medical decision making (see chart for details).  Clinical Course      Final Clinical Impressions(s) / UC Diagnoses   Final diagnoses:  Generalized abdominal pain    New Prescriptions New Prescriptions   No medications on file  will send patient to the ED immediately for evaluation and treatment.  This was discussed with patient and family member present and all questions answered.    Lanae Crumbly, PA-C 12/18/15 1818

## 2015-12-18 NOTE — ED Triage Notes (Signed)
Patient here with severe abdominal pain since 1pm. Patient restless on arrival with vomiting. Diaphoretic. Alert and oriented, denies diarrhea. No tenderness to palpation.

## 2015-12-18 NOTE — ED Provider Notes (Addendum)
[facility] DEPT Provider Note   CSN: [id number] Arrival date & time: 12/18/15  1821     History   Chief Complaint Chief Complaint  Patient presents with  . Abdominal Pain  . Emesis    HPI Wendy Kerr is a 82 y.o. female.  She presents for evaluation of abdominal pain, which started today and is severe. She went to an urgent care and was referred here for further evaluation. She denies recent fever, chills, vomiting, diarrhea, constipation, dysuria, or bloody bowel movements. No similar problem in the past. She was recently treated for congestive heart failure and has had a 50 pound weight loss over the last couple of months. Her appetite has been poor the last couple of days.  HPI  Past Medical History:  Diagnosis Date  . 1st degree AV block   . Anemia   . Atrial flutter ([place])   . Benign neoplasm of pancreas, except islets of Langerhans   . Bronchitis, mucopurulent recurrent ([place])   . Common migraine   . Diabetes mellitus ([place])   . Diverticulosis of colon   . DJD (degenerative joint disease)   . Generalized anxiety disorder   . Hypercholesterolemia   . Hypertension   . Monoclonal gammopathy   . Obesity   . OSA (obstructive sleep apnea)    severe with AHI 37 events per hour  . Vitamin D deficiency     Patient Active Problem List   Diagnosis Date Noted  . CHF (congestive heart failure) ([place]) 11/19/2015  . Acute on chronic systolic right heart failure ([place]) 11/18/2015  . Abdominal bloating 11/03/2015  . Viral URI with cough 07/02/2015  . Routine general medical examination at a health care facility 02/11/2015  . Vitamin D deficiency 11/30/2014  . Anemia, iron deficiency 11/30/2014  . Atrial flutter ([place]) 06/28/2014  . Right bundle branch block 06/22/2014  . CKD stage 3 due to type 2 diabetes mellitus ([place]) 06/21/2014  . OSA (obstructive sleep apnea)   . GERD (gastroesophageal reflux disease) 01/01/2014  . Chronic diastolic heart failure ([place])  01/21/2013    Class: Acute  . BEN NEOPLASM PANCREAS EXCEPT ISLETS LANGERHANS 08/16/2007  . Diabetes mellitus type 2 in obese ([place]) 02/10/2007  . HYPERCHOLESTEROLEMIA 02/10/2007  . MONOCLONAL GAMMOPATHY 02/10/2007  . Obesity 02/10/2007  . Essential hypertension 02/10/2007  . ANXIETY DISORDER, GENERALIZED 12/07/2006  . DEGENERATIVE JOINT DISEASE 12/07/2006    Past Surgical History:  Procedure Laterality Date  . ABDOMINAL HYSTERECTOMY  1972  . resection serous cystadenoma of pancreas  2004   at [facility].  Now (2017) with Dr Bridgett Larsson with [facility]    OB History    No data available       Home Medications    Prior to Admission medications   Medication Sig Start Date End Date Taking? Authorizing Provider  atorvastatin (LIPITOR) 20 MG tablet Take 1 tablet (20 mg total) by mouth daily at 6 PM. 11/23/15   Cheryln Manly, NP  digoxin (LANOXIN) 0.125 MG tablet Take 0.5 tablets (0.0625 mg total) by mouth every Monday, Wednesday, and Friday. 09/26/15   Belva Crome, MD  diltiazem (CARDIZEM CD) 120 MG 24 hr capsule Take 1 capsule (120 mg total) by mouth at bedtime. 11/23/15   Cheryln Manly, NP  ELIQUIS 2.5 MG TABS tablet TAKE 1 TABLET BY MOUTH 2 TIMES DAILY. 07/18/15   Belva Crome, MD  ergocalciferol (VITAMIN D2) 50000 units capsule Take 50,000 Units by mouth once a week. No specific day.  Usually take around the first of the week per patient    Historical Provider, MD  furosemide (LASIX) 80 MG tablet Take 80 mg by mouth 2 (two) times daily.    Historical Provider, MD  isosorbide mononitrate (IMDUR) 60 MG 24 hr tablet Take 0.5 tablets (30 mg total) by mouth daily. 11/23/15   Cheryln Manly, NP  metolazone (ZAROXOLYN) 2.5 MG tablet Take 2.5 mg by mouth daily.    Historical Provider, MD  Multiple Vitamin (MULTIVITAMIN WITH MINERALS) TABS tablet Take 1 tablet by mouth every morning.    Historical Provider, MD  nitroGLYCERIN (NITROSTAT) 0.4 MG SL tablet Place 1 tablet (0.4 mg total) under the  tongue every 5 (five) minutes as needed for chest pain. 08/13/14   Hoyt Koch, MD  OVER THE COUNTER MEDICATION Take 1 tablet by mouth daily. MED NAME: IRON    Historical Provider, MD    Family History Family History  Problem Relation Age of Onset  . Hypertension Mother   . Kidney failure Mother     dialysis  . Heart Problems Mother 55  . Hypertension Father   . Kidney failure Father     dialysis  . Heart Problems Father 41  . Kidney disease Sister   . Kidney disease Other     Social History Social History  Substance Use Topics  . Smoking status: Never Smoker  . Smokeless tobacco: Never Used  . Alcohol use No     Allergies   Piroxicam   Review of Systems Review of Systems  All other systems reviewed and are negative.    Physical Exam Updated Vital Signs BP 158/90   Pulse 77   Temp 97 F (36.1 C) (Rectal)   Resp 18   SpO2 94%   Physical Exam  Constitutional: She is oriented to person, place, and time. She appears well-developed.  Elderly, frail  HENT:  Head: Normocephalic and atraumatic.  Eyes: Conjunctivae and EOM are normal. Pupils are equal, round, and reactive to light.  Neck: Normal range of motion and phonation normal. Neck supple.  Cardiovascular: Normal rate and regular rhythm.   Pulmonary/Chest: Effort normal and breath sounds normal. She exhibits no tenderness.  Abdominal: Soft. She exhibits distension (mild). There is no tenderness (diffuse, mild). There is no guarding.  Musculoskeletal: Normal range of motion.  Neurological: She is alert and oriented to person, place, and time. She exhibits normal muscle tone.  Skin: Skin is warm and dry.  Psychiatric: She has a normal mood and affect. Her behavior is normal.  Nursing note and vitals reviewed.    ED Treatments / Results  Labs (all labs ordered are listed, but only abnormal results are displayed) Labs Reviewed  COMPREHENSIVE METABOLIC PANEL - Abnormal; Notable for the following:         Result Value   Sodium 132 (*)    Chloride 92 (*)    CO2 19 (*)    Glucose, Bld 199 (*)    BUN 118 (*)    Creatinine, Ser 2.54 (*)    AST 45 (*)    Alkaline Phosphatase 130 (*)    GFR calc non Af Amer 16 (*)    GFR calc Af Amer 19 (*)    Anion gap 21 (*)    All other components within normal limits  CBC - Abnormal; Notable for the following:    RBC 5.39 (*)    MCV 77.7 (*)    All other components within normal limits  I-STAT  CG4 LACTIC ACID, ED - Abnormal; Notable for the following:    Lactic Acid, Venous 2.24 (*)    All other components within normal limits  LIPASE, BLOOD  URINALYSIS, ROUTINE W REFLEX MICROSCOPIC (NOT AT [facility])  I-STAT CG4 LACTIC ACID, ED  POC OCCULT BLOOD, ED    EKG  EKG Interpretation  Date/Time:  Sunday December 18 2015 18:28:07 EDT Ventricular Rate:  74 PR Interval:    QRS Duration: 146 QT Interval:  470 QTC Calculation: 521 R Axis:   -76 Text Interpretation:  Wide QRS rhythm Left axis deviation Non-specific intra-ventricular conduction block Inferior infarct , age undetermined Anterolateral infarct , age undetermined Abnormal ECG since last tracing no significant change Confirmed by Eulis Foster  MD, Khrystal Jeanmarie [id number]) on 12/18/2015 9:09:26 PM       Radiology Ct Abdomen Pelvis Wo Contrast  Result Date: 12/18/2015 CLINICAL DATA:  82 year old female with acute abdominal pain. History of pancreatic mass. Patient feels nauseous. EXAM: CT ABDOMEN AND PELVIS WITHOUT CONTRAST TECHNIQUE: Multidetector CT imaging of the abdomen and pelvis was performed following the standard protocol without IV contrast. COMPARISON:  CT dated 11/14/2015 and ultrasound dated 11/15/2015 FINDINGS: Evaluation of this exam is limited in the absence of intravenous contrast. Lower chest: Minimal left lung base atelectatic changes. Cardiomegaly with partially visualized small pericardial effusion. No intra-abdominal free air. Small ascites, decreased from prior study. Hepatobiliary:  Irregularity of the hepatic contour may represent underlying cirrhosis. Clinical correlation is recommended. No intrahepatic biliary ductal dilatation. Mild periportal edema. The gallbladder is unremarkable. Pancreas: There is 4.5 x 5.0 cm (previously 4.8 x 5.0 cm) low attenuating mass in the body of the pancreas. There is no associated gland atrophy or dilatation of the main pancreatic duct. Spleen: Normal in size without focal abnormality. Adrenals/Urinary Tract: There is a 4.8 x 4.1 cm low attenuating lesion arising from the left adrenal gland. This lesion demonstrates macroscopic fat and may represent myelo lipoma. Other etiologies are not excluded. This is similar to the prior CT. The right adrenal gland appears unremarkable. There is mild bilateral renal atrophy. A 1.5 cm left renal interpolar hypodense lesion is not well characterized but likely represents a cyst. There is mild right hydronephrosis. A 5 mm calculus along the course of the proximal right ureter appears in stable positioning as the prior CT and likely represents vascular calcification. No definite renal or ureteral calculi identified. There is no hydronephrosis on the left. The urinary bladder is unremarkable. Stomach/Bowel: There is extensive sigmoid and colonic diverticulosis without active inflammatory changes. No evidence of bowel obstruction. Multiple nondilated fluid-filled loops of small bowel throughout the abdomen may be physiologic or represent enteritis. The appendix is not visualized with certainty. No inflammatory changes identified in the right lower quadrant. Vascular/Lymphatic: There is moderate aortoiliac atherosclerotic disease. The abdominal aorta and IVC are otherwise grossly unremarkable on this noncontrast study. No portal venous gas identified. There is no adenopathy. Reproductive: Hysterectomy. Other: Midline vertical anterior pelvic wall incisional scar. Mild diffuse subcutaneous stranding and edema. Musculoskeletal:  Multilevel degenerative changes of the spine. Multilevel disc desiccation with vacuum phenomena. No acute fracture. IMPRESSION: Nondistended fluid-filled loops of small bowel may represent enteritis. Clinical correlation is recommended. No bowel obstruction. Colonic diverticulosis without active inflammatory changes. Hypo attenuating mass in the body of the pancreas similar to the prior CT. No gland atrophy or ductal dilatation. 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 Other etiologies are not excluded. Mild right hydronephrosis no stone identified. Correlation with urinalysis recommended  to exclude UTI. Small ascites decreased from prior study. Possible cirrhosis.  Clinical correlation is recommended. Electronically Signed   By: Anner Crete M.D.   On: 12/18/2015 22:42    Procedures Procedures (including critical care time)  Medications Ordered in ED Medications  HYDROmorphone (DILAUDID) injection 0.5 mg (not administered)  ondansetron (ZOFRAN) injection 4 mg (4 mg Intravenous Given 12/18/15 1915)  sodium chloride 0.9 % bolus 500 mL (0 mLs Intravenous Stopped 12/18/15 2129)  ondansetron (ZOFRAN) injection 4 mg (4 mg Intravenous Given 12/18/15 2021)  fentaNYL (SUBLIMAZE) injection 100 mcg (100 mcg Intravenous Given 12/18/15 2021)  fentaNYL (SUBLIMAZE) injection 100 mcg (100 mcg Intravenous Given 12/18/15 2155)     Initial Impression / Assessment and Plan / ED Course  I have reviewed the triage vital signs and the nursing notes.  Pertinent labs & imaging results that were available during my care of the patient were reviewed by me and considered in my medical decision making (see chart for details).  Clinical Course    Medications  HYDROmorphone (DILAUDID) injection 0.5 mg (not administered)  ondansetron (ZOFRAN) injection 4 mg (4 mg Intravenous Given 12/18/15 1915)  sodium chloride 0.9 % bolus 500 mL (0 mLs Intravenous Stopped 12/18/15 2129)  ondansetron  (ZOFRAN) injection 4 mg (4 mg Intravenous Given 12/18/15 2021)  fentaNYL (SUBLIMAZE) injection 100 mcg (100 mcg Intravenous Given 12/18/15 2021)  fentaNYL (SUBLIMAZE) injection 100 mcg (100 mcg Intravenous Given 12/18/15 2155)    Patient Vitals for the past 24 hrs:  BP Temp Temp src Pulse Resp SpO2  12/18/15 2230 158/90 - - 77 18 94 %  12/18/15 2200 135/89 - - 78 18 (!) 81 %  12/18/15 2130 (!) 129/103 - - 73 18 100 %  12/18/15 1955 - 97 F (36.1 C) Rectal - - -  12/18/15 1904 126/80 - - 78 18 100 %  12/18/15 1829 96/81 97.8 F (36.6 C) Oral 73 24 100 %    11:15 PM Reevaluation with update and discussion. After initial assessment and treatment, an updated evaluation reveals She remains uncomfortable, moaning and requires further treatment. Patient and son updated on findings. Fabiha Rougeau L    11:19 PM-Consult complete with Hospitalist. Patient case explained and discussed. He agrees to admit patient for further evaluation and treatment. Call ended at [phone]  Final Clinical Impressions(s) / ED Diagnoses   Final diagnoses:  Generalized abdominal pain   Until pain, nonspecific, differential includes bowel ischemia, without infarction. Patient has atrophic fibrillation is on Eliquis. Doubt serious bacterial infection. Metabolic instability or impending vascular collapse. Cortisone. She had some GI discomfort and was referred to gastroenterology, who has evaluated her, in the office. She apparently had a paracentesis several weeks ago, which was felt to be required for congestive heart failure. Patient's pain is refractory to intravenous analgesia. She will require admission for further treatment and evaluation.   Nursing Notes Reviewed/ Care Coordinated, and agree without changes. Applicable Imaging Reviewed.  Interpretation of Laboratory Data incorporated into ED treatment  Plan: Admit  New Prescriptions New Prescriptions   No medications on file     Daleen Bo, MD 12/19/15  0031    Daleen Bo, MD 12/29/15 2049

## 2015-12-19 DIAGNOSIS — E1122 Type 2 diabetes mellitus with diabetic chronic kidney disease: Secondary | ICD-10-CM | POA: Diagnosis not present

## 2015-12-19 DIAGNOSIS — I10 Essential (primary) hypertension: Secondary | ICD-10-CM | POA: Diagnosis not present

## 2015-12-19 DIAGNOSIS — A09 Infectious gastroenteritis and colitis, unspecified: Secondary | ICD-10-CM | POA: Diagnosis not present

## 2015-12-19 DIAGNOSIS — N183 Chronic kidney disease, stage 3 (moderate): Secondary | ICD-10-CM | POA: Diagnosis not present

## 2015-12-19 DIAGNOSIS — E1169 Type 2 diabetes mellitus with other specified complication: Secondary | ICD-10-CM | POA: Diagnosis not present

## 2015-12-19 LAB — BASIC METABOLIC PANEL
Anion gap: 16 — ABNORMAL HIGH (ref 5–15)
BUN: 112 mg/dL — AB (ref 6–20)
CHLORIDE: 96 mmol/L — AB (ref 101–111)
CO2: 24 mmol/L (ref 22–32)
Calcium: 9.5 mg/dL (ref 8.9–10.3)
Creatinine, Ser: 2.28 mg/dL — ABNORMAL HIGH (ref 0.44–1.00)
GFR calc Af Amer: 22 mL/min — ABNORMAL LOW (ref >60)
GFR calc non Af Amer: 19 mL/min — ABNORMAL LOW (ref >60)
GLUCOSE: 228 mg/dL — AB (ref 65–99)
POTASSIUM: 3.4 mmol/L — AB (ref 3.5–5.1)
SODIUM: 136 mmol/L (ref 135–145)

## 2015-12-19 LAB — CBC
HEMATOCRIT: 43.3 % (ref 36.0–46.0)
Hemoglobin: 15.4 g/dL — ABNORMAL HIGH (ref 12.0–15.0)
MCH: 27.7 pg (ref 26.0–34.0)
MCHC: 35.6 g/dL (ref 30.0–36.0)
MCV: 77.9 fL — AB (ref 78.0–100.0)
Platelets: 305 10*3/uL (ref 150–400)
RBC: 5.56 MIL/uL — ABNORMAL HIGH (ref 3.87–5.11)
RDW: 14.7 % (ref 11.5–15.5)
WBC: 11 10*3/uL — AB (ref 4.0–10.5)

## 2015-12-19 LAB — GLUCOSE, CAPILLARY
GLUCOSE-CAPILLARY: 157 mg/dL — AB (ref 65–99)
Glucose-Capillary: 222 mg/dL — ABNORMAL HIGH (ref 65–99)
Glucose-Capillary: 190 mg/dL — ABNORMAL HIGH (ref 65–99)
Glucose-Capillary: 180 mg/dL — ABNORMAL HIGH (ref 65–99)
Glucose-Capillary: 188 mg/dL — ABNORMAL HIGH (ref 65–99)
Glucose-Capillary: 190 mg/dL — ABNORMAL HIGH (ref 65–99)
Glucose-Capillary: 206 mg/dL — ABNORMAL HIGH (ref 65–99)

## 2015-12-19 LAB — LACTIC ACID, PLASMA
Lactic Acid, Venous: 1.5 mmol/L (ref 0.5–1.9)
Lactic Acid, Venous: 1.5 mmol/L (ref 0.5–1.9)

## 2015-12-19 MED ORDER — ZOLPIDEM TARTRATE 5 MG PO TABS
5.0000 mg | ORAL_TABLET | Freq: Every evening | ORAL | Status: DC | PRN
  Administered 2015-12-19: 5 mg via ORAL
  Filled 2015-12-19: qty 1

## 2015-12-19 MED ORDER — METRONIDAZOLE IN NACL 5-0.79 MG/ML-% IV SOLN
500.0000 mg | Freq: Three times a day (TID) | INTRAVENOUS | Status: DC
  Administered 2015-12-19 – 2015-12-23 (×13): 500 mg via INTRAVENOUS
  Filled 2015-12-19 (×15): qty 100

## 2015-12-19 MED ORDER — POTASSIUM CHLORIDE CRYS ER 20 MEQ PO TBCR
40.0000 meq | EXTENDED_RELEASE_TABLET | Freq: Once | ORAL | Status: AC
  Administered 2015-12-19: 40 meq via ORAL
  Filled 2015-12-19: qty 2

## 2015-12-19 MED ORDER — INSULIN ASPART 100 UNIT/ML ~~LOC~~ SOLN
0.0000 [IU] | Freq: Three times a day (TID) | SUBCUTANEOUS | Status: DC
  Administered 2015-12-19: 3 [IU] via SUBCUTANEOUS
  Administered 2015-12-20: 2 [IU] via SUBCUTANEOUS
  Administered 2015-12-20: 1 [IU] via SUBCUTANEOUS
  Administered 2015-12-20 – 2015-12-21 (×2): 2 [IU] via SUBCUTANEOUS

## 2015-12-19 MED ORDER — CHLORHEXIDINE GLUCONATE 0.12 % MT SOLN
15.0000 mL | Freq: Two times a day (BID) | OROMUCOSAL | Status: DC
  Administered 2015-12-19 – 2016-01-01 (×27): 15 mL via OROMUCOSAL
  Filled 2015-12-19 (×22): qty 15

## 2015-12-19 MED ORDER — ORAL CARE MOUTH RINSE
15.0000 mL | Freq: Two times a day (BID) | OROMUCOSAL | Status: DC
  Administered 2015-12-19 – 2015-12-30 (×13): 15 mL via OROMUCOSAL

## 2015-12-19 MED ORDER — BISACODYL 10 MG RE SUPP
10.0000 mg | Freq: Every day | RECTAL | Status: DC | PRN
  Administered 2015-12-19: 10 mg via RECTAL
  Filled 2015-12-19: qty 1

## 2015-12-19 MED ORDER — CIPROFLOXACIN IN D5W 400 MG/200ML IV SOLN
400.0000 mg | INTRAVENOUS | Status: DC
  Administered 2015-12-19 – 2015-12-23 (×5): 400 mg via INTRAVENOUS
  Filled 2015-12-19 (×5): qty 200

## 2015-12-19 NOTE — Progress Notes (Signed)
Pharmacy Antibiotic Note  Wendy Kerr is a 82 y.o. female admitted on 12/18/2015 with intra-abdominal infection.  Pharmacy has been consulted for cipro dosing. Wt 71.4 kg, AKI with creat 2.54>2.28, lactate WNL, AF.   Plan: cipro 400 mg IV q24 Also on flagyl 500 IV q8h  Height: 5\' 5"  (165.1 cm) Weight: 157 lb 6.4 oz (71.4 kg) IBW/kg (Calculated) : 57  Temp (24hrs), Avg:97.3 F (36.3 C), Min:97 F (36.1 C), Max:97.8 F (36.6 C)   Recent Labs Lab 12/18/15 1900 12/18/15 1912 12/18/15 2153 12/19/15 0049 12/19/15 0339  WBC 8.5  --   --   --  11.0*  CREATININE 2.54*  --   --   --  2.28*  LATICACIDVEN  --  2.24* 1.16 1.5 1.5    Estimated Creatinine Clearance: 18.5 mL/min (by C-G formula based on SCr of 2.28 mg/dL (H)).    Allergies  Allergen Reactions  . Piroxicam Other (See Comments)    REACTION: HEADACHE    Antimicrobials this admission: cipro 10/23>> Flagyl 10/23>> Dose adjustments this admission: n/a  Microbiology results:  none  Thank you for allowing pharmacy to be a part of this patient's care.  Eudelia Bunch, Pharm.D. [id number] 12/19/2015 1:12 PM

## 2015-12-19 NOTE — Care Management Obs Status (Signed)
[place] NOTIFICATION   Patient Details  Name: Wendy Kerr MRN: [id number] Date of Birth: October 05, 1930   Medicare Observation Status Notification Given:  Yes Explained to patient. Patient wants to discuss with her son before signing letter. Letter left at bedside.    Marilu Favre, RN 12/19/2015, 9:01 AM

## 2015-12-19 NOTE — Progress Notes (Signed)
PROGRESS NOTE    Wendy Kerr  [id number] DOB: [birth year] DOA: 12/18/2015 PCP: Hoyt Koch, MD   Outpatient Specialists:     Brief Narrative:  Wendy Kerr is a 82 y.o. female with medical history significant of CHF with EF 40-45%, CKD.  Patient presents to the ED with c/o abdominal pain, N/V.  Abdominal pain is throughout the abdomen and especially in lower abdomen.  Symptoms are severe.  No fever, chills, diarrhea, dysuria.  No blood in vomit or BM.  50lb weight loss over last couple of months.  Recent admit and treatment for CHF.  Today she ate fried chicken from a gas station and symptoms onset shortly there after.  Nothing makes symptoms better or worse.    Assessment & Plan:   Principal Problem:   Enteritis presumed infectious Active Problems:   Diabetes mellitus type 2 in obese [facility])   Essential hypertension   CKD stage 3 due to type 2 diabetes mellitus (HCC)   AKI (acute kidney injury) (H[facility])   Nausea and vomiting   Enteritis presumed infectious - lactates  trending down (now WNL) doubt that this represents acute mesenteric ischemia/ischemic enteritis/ dead bowel zofran PRN clear Did have paracentesis for ascites a couple of weeks back, but absolutely no SIRS and no abdominal TTP at the moment (much less peritoneal signs) to suggest SBP.  Now has very little ascites. - had WBC count add IV abx  DM2 - appears to be diet controlled -SSI  HTN - continue home meds  CHF - holding diuretics-- ? Resume tomm  AKI on CKD - follow BMP   DVT prophylaxis:  SCD's  Code Status: Full Code   Family Communication: patient  Disposition Plan:  Home 1-2 days   Consultants:      Subjective: Abdominal pain is improved Asking to eat  Objective: Vitals:   12/19/15 0000 12/19/15 0030 12/19/15 0119 12/19/15 0544  BP: 143/98 151/95 (!) 157/94 (!) 150/91  Pulse: 82 82 83 83  Resp:   16 16  Temp:   97.4 F (36.3 C) 97.1 F (36.2 C)  TempSrc:    Oral Axillary  SpO2: 97% 97% 100% 100%  Weight:   71.4 kg (157 lb 6.4 oz)   Height:   5\' 5"  (1.651 m)     Intake/Output Summary (Last 24 hours) at 12/19/15 1249 Last data filed at 12/19/15 1109  Gross per 24 hour  Intake           1277.5 ml  Output              300 ml  Net            977.5 ml   Filed Weights   12/19/15 0119  Weight: 71.4 kg (157 lb 6.4 oz)    Examination:  General exam: NAD Respiratory system: Clear to auscultation. Respiratory effort normal. Cardiovascular system: S1 & S2 heard, RRR. No JVD, murmurs, rubs, gallops or clicks. No pedal edema. Gastrointestinal system: Abdomen is nondistended, soft and nontender. No organomegaly or masses felt.  bowel sounds heard in all 4 quadrants Central nervous system: sleepy- will awake and answer questions but falls quickly back to sleep    Data Reviewed: I have personally reviewed following labs and imaging studies  CBC:  Recent Labs Lab 12/18/15 1900 12/19/15 0339  WBC 8.5 11.0*  HGB 15.0 15.4*  HCT 41.9 43.3  MCV 77.7* 77.9*  PLT 328 [id number]   Basic Metabolic Panel:  Recent Labs Lab  12/18/15 1900 12/19/15 0339  NA 132* 136  K 4.9 3.4*  CL 92* 96*  CO2 19* 24  GLUCOSE 199* 228*  BUN 118* 112*  CREATININE 2.54* 2.28*  CALCIUM 9.6 9.5   GFR: Estimated Creatinine Clearance: 18.5 mL/min (by C-G formula based on SCr of 2.28 mg/dL (H)). Liver Function Tests:  Recent Labs Lab 12/18/15 1900  AST 45*  ALT 27  ALKPHOS 130*  BILITOT 1.0  PROT 7.9  ALBUMIN 4.6    Recent Labs Lab 12/18/15 1900  LIPASE 48   No results for input(s): AMMONIA in the last 168 hours. Coagulation Profile: No results for input(s): INR, PROTIME in the last 168 hours. Cardiac Enzymes: No results for input(s): CKTOTAL, CKMB, CKMBINDEX, TROPONINI in the last 168 hours. BNP (last 3 results)  Recent Labs  11/09/15 1455  PROBNP 1,559.0*   HbA1C: No results for input(s): HGBA1C in the last 72 hours. CBG:  Recent  Labs Lab 12/18/15 2354 12/19/15 0153 12/19/15 0545 12/19/15 0750 12/19/15 1221  GLUCAP 229* 222* 190* 180* 188*   Lipid Profile: No results for input(s): CHOL, HDL, LDLCALC, TRIG, CHOLHDL, LDLDIRECT in the last 72 hours. Thyroid Function Tests: No results for input(s): TSH, T4TOTAL, FREET4, T3FREE, THYROIDAB in the last 72 hours. Anemia Panel: No results for input(s): VITAMINB12, FOLATE, FERRITIN, TIBC, IRON, RETICCTPCT in the last 72 hours. Urine analysis:    Component Value Date/Time   COLORURINE YELLOW 12/18/2015 2239   APPEARANCEUR CLEAR 12/18/2015 2239   LABSPEC 1.011 12/18/2015 2239   PHURINE 5.5 12/18/2015 2239   GLUCOSEU NEGATIVE 12/18/2015 2239   GLUCOSEU Color Interference (A) 08/15/2015 1245   HGBUR SMALL (A) 12/18/2015 2239   BILIRUBINUR NEGATIVE 12/18/2015 2239   KETONESUR NEGATIVE 12/18/2015 2239   PROTEINUR 100 (A) 12/18/2015 2239   UROBILINOGEN Color Interference (A) 08/15/2015 1245   NITRITE NEGATIVE 12/18/2015 2239   LEUKOCYTESUR NEGATIVE 12/18/2015 2239     )No results found for this or any previous visit (from the past 240 hour(s)).    Anti-infectives    None       Radiology Studies: Ct Abdomen Pelvis Wo Contrast  Result Date: 12/18/2015 CLINICAL DATA:  82 year old female with acute abdominal pain. History of pancreatic mass. Patient feels nauseous. EXAM: CT ABDOMEN AND PELVIS WITHOUT CONTRAST TECHNIQUE: Multidetector CT imaging of the abdomen and pelvis was performed following the standard protocol without IV contrast. COMPARISON:  CT dated 11/14/2015 and ultrasound dated 11/15/2015 FINDINGS: Evaluation of this exam is limited in the absence of intravenous contrast. Lower chest: Minimal left lung base atelectatic changes. Cardiomegaly with partially visualized small pericardial effusion. No intra-abdominal free air. Small ascites, decreased from prior study. Hepatobiliary: Irregularity of the hepatic contour may represent underlying cirrhosis.  Clinical correlation is recommended. No intrahepatic biliary ductal dilatation. Mild periportal edema. The gallbladder is unremarkable. Pancreas: There is 4.5 x 5.0 cm (previously 4.8 x 5.0 cm) low attenuating mass in the body of the pancreas. There is no associated gland atrophy or dilatation of the main pancreatic duct. Spleen: Normal in size without focal abnormality. Adrenals/Urinary Tract: There is a 4.8 x 4.1 cm low attenuating lesion arising from the left adrenal gland. This lesion demonstrates macroscopic fat and may represent myelo lipoma. Other etiologies are not excluded. This is similar to the prior CT. The right adrenal gland appears unremarkable. There is mild bilateral renal atrophy. A 1.5 cm left renal interpolar hypodense lesion is not well characterized but likely represents a cyst. There is mild right hydronephrosis. A  5 mm calculus along the course of the proximal right ureter appears in stable positioning as the prior CT and likely represents vascular calcification. No definite renal or ureteral calculi identified. There is no hydronephrosis on the left. The urinary bladder is unremarkable. Stomach/Bowel: There is extensive sigmoid and colonic diverticulosis without active inflammatory changes. No evidence of bowel obstruction. Multiple nondilated fluid-filled loops of small bowel throughout the abdomen may be physiologic or represent enteritis. The appendix is not visualized with certainty. No inflammatory changes identified in the right lower quadrant. Vascular/Lymphatic: There is moderate aortoiliac atherosclerotic disease. The abdominal aorta and IVC are otherwise grossly unremarkable on this noncontrast study. No portal venous gas identified. There is no adenopathy. Reproductive: Hysterectomy. Other: Midline vertical anterior pelvic wall incisional scar. Mild diffuse subcutaneous stranding and edema. Musculoskeletal: Multilevel degenerative changes of the spine. Multilevel disc desiccation  with vacuum phenomena. No acute fracture. IMPRESSION: Nondistended fluid-filled loops of small bowel may represent enteritis. Clinical correlation is recommended. No bowel obstruction. Colonic diverticulosis without active inflammatory changes. Hypo attenuating mass in the body of the pancreas similar to the prior CT. No gland atrophy or ductal dilatation. 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 Other etiologies are not excluded. Mild right hydronephrosis no stone identified. Correlation with urinalysis recommended to exclude UTI. Small ascites decreased from prior study. Possible cirrhosis.  Clinical correlation is recommended. Electronically Signed   By: Anner Crete M.D.   On: 12/18/2015 22:42        Scheduled Meds: . apixaban  2.5 mg Oral BID  . atorvastatin  20 mg Oral q1800  . chlorhexidine  15 mL Mouth Rinse BID  . digoxin  0.0625 mg Oral Q M,W,F  . diltiazem  120 mg Oral QHS  . isosorbide mononitrate  30 mg Oral Daily  . mouth rinse  15 mL Mouth Rinse q12n4p  . multivitamin with minerals  1 tablet Oral q morning - 10a   Continuous Infusions:    LOS: 0 days    Time spent: 25 min    [employer], DO [facility] Pager [phone]  If 7PM-7AM, please contact night-coverage [email or web address] Password [email or web address] 12/19/2015, 12:49 PM

## 2015-12-19 NOTE — Progress Notes (Signed)
PT Cancellation Note  Patient Details Name: Wendy Kerr MRN: [id number] DOB: 1930-09-24   Cancelled Treatment:    Reason Eval/Treat Not Completed: Other (comment). RN reports pt feeling ill and trying to have BM. RN tech in assisting her. PT to return as able.   Kingsley Callander 12/19/2015, 11:07 AM   Kittie Plater, PT, DPT Pager #: [phone] Office #: [phone]

## 2015-12-19 NOTE — ED Notes (Signed)
CBG is 229. 

## 2015-12-19 NOTE — ED Notes (Signed)
Attempted report.  Nurse to call back when available. 

## 2015-12-20 DIAGNOSIS — E860 Dehydration: Secondary | ICD-10-CM | POA: Diagnosis present

## 2015-12-20 DIAGNOSIS — K56609 Unspecified intestinal obstruction, unspecified as to partial versus complete obstruction: Secondary | ICD-10-CM | POA: Diagnosis not present

## 2015-12-20 DIAGNOSIS — Z7901 Long term (current) use of anticoagulants: Secondary | ICD-10-CM | POA: Diagnosis not present

## 2015-12-20 DIAGNOSIS — E876 Hypokalemia: Secondary | ICD-10-CM | POA: Diagnosis present

## 2015-12-20 DIAGNOSIS — Z90710 Acquired absence of both cervix and uterus: Secondary | ICD-10-CM | POA: Diagnosis not present

## 2015-12-20 DIAGNOSIS — I4892 Unspecified atrial flutter: Secondary | ICD-10-CM | POA: Diagnosis not present

## 2015-12-20 DIAGNOSIS — Z8249 Family history of ischemic heart disease and other diseases of the circulatory system: Secondary | ICD-10-CM | POA: Diagnosis not present

## 2015-12-20 DIAGNOSIS — R188 Other ascites: Secondary | ICD-10-CM | POA: Diagnosis not present

## 2015-12-20 DIAGNOSIS — R110 Nausea: Secondary | ICD-10-CM | POA: Diagnosis not present

## 2015-12-20 DIAGNOSIS — I480 Paroxysmal atrial fibrillation: Secondary | ICD-10-CM | POA: Diagnosis not present

## 2015-12-20 DIAGNOSIS — I130 Hypertensive heart and chronic kidney disease with heart failure and stage 1 through stage 4 chronic kidney disease, or unspecified chronic kidney disease: Secondary | ICD-10-CM | POA: Diagnosis not present

## 2015-12-20 DIAGNOSIS — Z841 Family history of disorders of kidney and ureter: Secondary | ICD-10-CM | POA: Diagnosis not present

## 2015-12-20 DIAGNOSIS — E1169 Type 2 diabetes mellitus with other specified complication: Secondary | ICD-10-CM | POA: Diagnosis not present

## 2015-12-20 DIAGNOSIS — D72829 Elevated white blood cell count, unspecified: Secondary | ICD-10-CM | POA: Diagnosis present

## 2015-12-20 DIAGNOSIS — G4733 Obstructive sleep apnea (adult) (pediatric): Secondary | ICD-10-CM | POA: Diagnosis present

## 2015-12-20 DIAGNOSIS — N183 Chronic kidney disease, stage 3 (moderate): Secondary | ICD-10-CM | POA: Diagnosis not present

## 2015-12-20 DIAGNOSIS — I10 Essential (primary) hypertension: Secondary | ICD-10-CM | POA: Diagnosis not present

## 2015-12-20 DIAGNOSIS — I5042 Chronic combined systolic (congestive) and diastolic (congestive) heart failure: Secondary | ICD-10-CM | POA: Diagnosis not present

## 2015-12-20 DIAGNOSIS — I272 Pulmonary hypertension, unspecified: Secondary | ICD-10-CM | POA: Diagnosis present

## 2015-12-20 DIAGNOSIS — E7800 Pure hypercholesterolemia, unspecified: Secondary | ICD-10-CM | POA: Diagnosis not present

## 2015-12-20 DIAGNOSIS — K55029 Acute infarction of small intestine, extent unspecified: Secondary | ICD-10-CM | POA: Diagnosis not present

## 2015-12-20 DIAGNOSIS — N179 Acute kidney failure, unspecified: Secondary | ICD-10-CM | POA: Diagnosis not present

## 2015-12-20 DIAGNOSIS — E669 Obesity, unspecified: Secondary | ICD-10-CM | POA: Diagnosis not present

## 2015-12-20 DIAGNOSIS — A09 Infectious gastroenteritis and colitis, unspecified: Secondary | ICD-10-CM | POA: Diagnosis not present

## 2015-12-20 DIAGNOSIS — E1122 Type 2 diabetes mellitus with diabetic chronic kidney disease: Secondary | ICD-10-CM | POA: Diagnosis not present

## 2015-12-20 DIAGNOSIS — F411 Generalized anxiety disorder: Secondary | ICD-10-CM | POA: Diagnosis present

## 2015-12-20 DIAGNOSIS — E785 Hyperlipidemia, unspecified: Secondary | ICD-10-CM | POA: Diagnosis present

## 2015-12-20 DIAGNOSIS — Z01810 Encounter for preprocedural cardiovascular examination: Secondary | ICD-10-CM | POA: Diagnosis not present

## 2015-12-20 DIAGNOSIS — R1084 Generalized abdominal pain: Secondary | ICD-10-CM | POA: Diagnosis present

## 2015-12-20 LAB — CBC
HCT: 43.5 % (ref 36.0–46.0)
Hemoglobin: 15.1 g/dL — ABNORMAL HIGH (ref 12.0–15.0)
MCH: 27.6 pg (ref 26.0–34.0)
MCHC: 34.7 g/dL (ref 30.0–36.0)
MCV: 79.5 fL (ref 78.0–100.0)
PLATELETS: 306 10*3/uL (ref 150–400)
RBC: 5.47 MIL/uL — ABNORMAL HIGH (ref 3.87–5.11)
RDW: 15.3 % (ref 11.5–15.5)
WBC: 19.4 10*3/uL — ABNORMAL HIGH (ref 4.0–10.5)

## 2015-12-20 LAB — BASIC METABOLIC PANEL
Anion gap: 16 — ABNORMAL HIGH (ref 5–15)
BUN: 108 mg/dL — AB (ref 6–20)
CHLORIDE: 94 mmol/L — AB (ref 101–111)
CO2: 24 mmol/L (ref 22–32)
CREATININE: 2.31 mg/dL — AB (ref 0.44–1.00)
Calcium: 9.3 mg/dL (ref 8.9–10.3)
GFR calc Af Amer: 21 mL/min — ABNORMAL LOW (ref >60)
GFR, EST NON AFRICAN AMERICAN: 18 mL/min — AB (ref >60)
Glucose, Bld: 160 mg/dL — ABNORMAL HIGH (ref 65–99)
Potassium: 4 mmol/L (ref 3.5–5.1)
SODIUM: 134 mmol/L — AB (ref 135–145)

## 2015-12-20 LAB — GLUCOSE, CAPILLARY
GLUCOSE-CAPILLARY: 140 mg/dL — AB (ref 65–99)
GLUCOSE-CAPILLARY: 187 mg/dL — AB (ref 65–99)
Glucose-Capillary: 174 mg/dL — ABNORMAL HIGH (ref 65–99)
Glucose-Capillary: 85 mg/dL (ref 65–99)

## 2015-12-20 MED ORDER — LACTULOSE 10 GM/15ML PO SOLN
20.0000 g | Freq: Two times a day (BID) | ORAL | Status: DC | PRN
  Administered 2015-12-20: 20 g via ORAL
  Filled 2015-12-20: qty 30

## 2015-12-20 MED ORDER — PROMETHAZINE HCL 25 MG/ML IJ SOLN
12.5000 mg | Freq: Once | INTRAMUSCULAR | Status: AC
  Administered 2015-12-20: 12.5 mg via INTRAVENOUS
  Filled 2015-12-20: qty 1

## 2015-12-20 MED ORDER — FUROSEMIDE 40 MG PO TABS
40.0000 mg | ORAL_TABLET | Freq: Two times a day (BID) | ORAL | Status: DC
  Administered 2015-12-20 – 2015-12-21 (×2): 40 mg via ORAL
  Filled 2015-12-20 (×2): qty 1

## 2015-12-20 MED ORDER — SENNOSIDES-DOCUSATE SODIUM 8.6-50 MG PO TABS: 1.0000 | ORAL_TABLET | Freq: Every evening | ORAL | Status: DC | PRN

## 2015-12-20 MED ORDER — POLYETHYLENE GLYCOL 3350 17 G PO PACK
17.0000 g | PACK | Freq: Every day | ORAL | Status: DC
  Administered 2015-12-20 – 2015-12-21 (×2): 17 g via ORAL
  Filled 2015-12-20 (×2): qty 1

## 2015-12-20 NOTE — Progress Notes (Signed)
PROGRESS NOTE    Wendy Kerr  [id number] DOB: June 03, 1930 DOA: 12/18/2015 PCP: Hoyt Koch, MD   Outpatient Specialists:     Brief Narrative:  Wendy Kerr is a 82 y.o. female with medical history significant of CHF with EF 40-45%, CKD.  Patient presents to the ED with c/o abdominal pain, N/V.  Abdominal pain is throughout the abdomen and especially in lower abdomen.  Symptoms are severe.  No fever, chills, diarrhea, dysuria.  No blood in vomit or BM.  50lb weight loss over last couple of months.  Recent admit and treatment for CHF.  Today she ate fried chicken from a gas station and symptoms onset shortly there after.  Nothing makes symptoms better or worse.    Assessment & Plan:   Principal Problem:   Enteritis presumed infectious Active Problems:   Diabetes mellitus type 2 in obese [facility])   Essential hypertension   CKD stage 3 due to type 2 diabetes mellitus (HCC)   AKI (acute kidney injury) (HCC)   Nausea and vomiting   Enteritis presumed infectious - no BM since admission lactates  now WNL doubt that this represents acute mesenteric ischemia/ischemic enteritis/ dead bowel zofran PRN clears Did have paracentesis for ascites a couple of weeks back, but absolutely no SIRS and no abdominal TTP at the moment (much less peritoneal signs) to suggest SBP.  Now has very little ascites. -increasing WBC count added IV abx (cipro/flagyl on 10/23)  DM2 - appears to be diet controlled -SSI  HTN - continue home meds  Chronic CHF -resume diuretics  AKI on CKD - follow BMP daily   DVT prophylaxis:  SCD's  Code Status: Full Code   Family Communication: Patient/son on phone 10/23  Disposition Plan:  Home 1-2 days- PT eval pending and needs to have BM   Consultants:      Subjective: Tolerating clears No BM since admission but + flatus   Objective: Vitals:   12/19/15 0544 12/19/15 1609 12/19/15 2107 12/20/15 0550  BP: (!) 150/91 140/86 (!)  147/90 138/84  Pulse: 83 83 97 92  Resp: 16 17 16 16   Temp: 97.1 F (36.2 C) 97.5 F (36.4 C) 97.8 F (36.6 C) 98.2 F (36.8 C)  TempSrc: Axillary Oral Oral Oral  SpO2: 100% 100% 97% 94%  Weight:      Height:        Intake/Output Summary (Last 24 hours) at 12/20/15 1103 Last data filed at 12/20/15 0918  Gross per 24 hour  Intake             1580 ml  Output             1400 ml  Net              180 ml   Filed Weights   12/19/15 0119  Weight: 71.4 kg (157 lb 6.4 oz)    Examination:  General exam: NAD Respiratory system: Clear to auscultation. Respiratory effort normal. Cardiovascular system: S1 & S2 heard, RRR. No JVD, murmurs, rubs, gallops or clicks. No pedal edema. Gastrointestinal system: Abdomen is nondistended, soft and nontender. No organomegaly or masses felt.  bowel sounds heard in all 4 quadrants Central nervous system: sleepy- will awake and answer questions but falls quickly back to sleep    Data Reviewed: I have personally reviewed following labs and imaging studies  CBC:  Recent Labs Lab 12/18/15 1900 12/19/15 0339 12/20/15 0424  WBC 8.5 11.0* 19.4*  HGB 15.0 15.4* 15.1*  HCT 41.9 43.3 43.5  MCV 77.7* 77.9* 79.5  PLT 328 305 [id number]   Basic Metabolic Panel:  Recent Labs Lab 12/18/15 1900 12/19/15 0339 12/20/15 0424  NA 132* 136 134*  K 4.9 3.4* 4.0  CL 92* 96* 94*  CO2 19* 24 24  GLUCOSE 199* 228* 160*  BUN 118* 112* 108*  CREATININE 2.54* 2.28* 2.31*  CALCIUM 9.6 9.5 9.3   GFR: Estimated Creatinine Clearance: 18.3 mL/min (by C-G formula based on SCr of 2.31 mg/dL (H)). Liver Function Tests:  Recent Labs Lab 12/18/15 1900  AST 45*  ALT 27  ALKPHOS 130*  BILITOT 1.0  PROT 7.9  ALBUMIN 4.6    Recent Labs Lab 12/18/15 1900  LIPASE 48   No results for input(s): AMMONIA in the last 168 hours. Coagulation Profile: No results for input(s): INR, PROTIME in the last 168 hours. Cardiac Enzymes: No results for input(s): CKTOTAL,  CKMB, CKMBINDEX, TROPONINI in the last 168 hours. BNP (last 3 results)  Recent Labs  11/09/15 1455  PROBNP 1,559.0*   HbA1C: No results for input(s): HGBA1C in the last 72 hours. CBG:  Recent Labs Lab 12/19/15 1221 12/19/15 1607 12/19/15 1711 12/19/15 2102 12/20/15 0734  GLUCAP 188* 190* 206* 157* 140*   Lipid Profile: No results for input(s): CHOL, HDL, LDLCALC, TRIG, CHOLHDL, LDLDIRECT in the last 72 hours. Thyroid Function Tests: No results for input(s): TSH, T4TOTAL, FREET4, T3FREE, THYROIDAB in the last 72 hours. Anemia Panel: No results for input(s): VITAMINB12, FOLATE, FERRITIN, TIBC, IRON, RETICCTPCT in the last 72 hours. Urine analysis:    Component Value Date/Time   COLORURINE YELLOW 12/18/2015 2239   APPEARANCEUR CLEAR 12/18/2015 2239   LABSPEC 1.011 12/18/2015 2239   PHURINE 5.5 12/18/2015 2239   GLUCOSEU NEGATIVE 12/18/2015 2239   GLUCOSEU Color Interference (A) 08/15/2015 1245   HGBUR SMALL (A) 12/18/2015 2239   BILIRUBINUR NEGATIVE 12/18/2015 2239   KETONESUR NEGATIVE 12/18/2015 2239   PROTEINUR 100 (A) 12/18/2015 2239   UROBILINOGEN Color Interference (A) 08/15/2015 1245   NITRITE NEGATIVE 12/18/2015 2239   LEUKOCYTESUR NEGATIVE 12/18/2015 2239     )No results found for this or any previous visit (from the past 240 hour(s)).    Anti-infectives    Start     Dose/Rate Route Frequency Ordered Stop   12/19/15 1400  metroNIDAZOLE (FLAGYL) IVPB 500 mg     500 mg 100 mL/hr over 60 Minutes Intravenous Every 8 hours 12/19/15 1257     12/19/15 1400  ciprofloxacin (CIPRO) IVPB 400 mg     400 mg 200 mL/hr over 60 Minutes Intravenous Every 24 hours 12/19/15 1307         Radiology Studies: Ct Abdomen Pelvis Wo Contrast  Result Date: 12/18/2015 CLINICAL DATA:  82 year old female with acute abdominal pain. History of pancreatic mass. Patient feels nauseous. EXAM: CT ABDOMEN AND PELVIS WITHOUT CONTRAST TECHNIQUE: Multidetector CT imaging of the abdomen  and pelvis was performed following the standard protocol without IV contrast. COMPARISON:  CT dated 11/14/2015 and ultrasound dated 11/15/2015 FINDINGS: Evaluation of this exam is limited in the absence of intravenous contrast. Lower chest: Minimal left lung base atelectatic changes. Cardiomegaly with partially visualized small pericardial effusion. No intra-abdominal free air. Small ascites, decreased from prior study. Hepatobiliary: Irregularity of the hepatic contour may represent underlying cirrhosis. Clinical correlation is recommended. No intrahepatic biliary ductal dilatation. Mild periportal edema. The gallbladder is unremarkable. Pancreas: There is 4.5 x 5.0 cm (previously 4.8 x 5.0 cm) low attenuating mass in  the body of the pancreas. There is no associated gland atrophy or dilatation of the main pancreatic duct. Spleen: Normal in size without focal abnormality. Adrenals/Urinary Tract: There is a 4.8 x 4.1 cm low attenuating lesion arising from the left adrenal gland. This lesion demonstrates macroscopic fat and may represent myelo lipoma. Other etiologies are not excluded. This is similar to the prior CT. The right adrenal gland appears unremarkable. There is mild bilateral renal atrophy. A 1.5 cm left renal interpolar hypodense lesion is not well characterized but likely represents a cyst. There is mild right hydronephrosis. A 5 mm calculus along the course of the proximal right ureter appears in stable positioning as the prior CT and likely represents vascular calcification. No definite renal or ureteral calculi identified. There is no hydronephrosis on the left. The urinary bladder is unremarkable. Stomach/Bowel: There is extensive sigmoid and colonic diverticulosis without active inflammatory changes. No evidence of bowel obstruction. Multiple nondilated fluid-filled loops of small bowel throughout the abdomen may be physiologic or represent enteritis. The appendix is not visualized with certainty. No  inflammatory changes identified in the right lower quadrant. Vascular/Lymphatic: There is moderate aortoiliac atherosclerotic disease. The abdominal aorta and IVC are otherwise grossly unremarkable on this noncontrast study. No portal venous gas identified. There is no adenopathy. Reproductive: Hysterectomy. Other: Midline vertical anterior pelvic wall incisional scar. Mild diffuse subcutaneous stranding and edema. Musculoskeletal: Multilevel degenerative changes of the spine. Multilevel disc desiccation with vacuum phenomena. No acute fracture. IMPRESSION: Nondistended fluid-filled loops of small bowel may represent enteritis. Clinical correlation is recommended. No bowel obstruction. Colonic diverticulosis without active inflammatory changes. Hypo attenuating mass in the body of the pancreas similar to the prior CT. No gland atrophy or ductal dilatation. 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 Other etiologies are not excluded. Mild right hydronephrosis no stone identified. Correlation with urinalysis recommended to exclude UTI. Small ascites decreased from prior study. Possible cirrhosis.  Clinical correlation is recommended. Electronically Signed   By: Anner Crete M.D.   On: 12/18/2015 22:42        Scheduled Meds: . apixaban  2.5 mg Oral BID  . atorvastatin  20 mg Oral q1800  . chlorhexidine  15 mL Mouth Rinse BID  . ciprofloxacin  400 mg Intravenous Q24H  . digoxin  0.0625 mg Oral Q M,W,F  . diltiazem  120 mg Oral QHS  . insulin aspart  0-9 Units Subcutaneous TID WC  . isosorbide mononitrate  30 mg Oral Daily  . mouth rinse  15 mL Mouth Rinse q12n4p  . metronidazole  500 mg Intravenous Q8H  . multivitamin with minerals  1 tablet Oral q morning - 10a  . polyethylene glycol  17 g Oral Daily   Continuous Infusions:    LOS: 0 days    Time spent: 25 min    [place], DO [facility] Pager [phone]  If 7PM-7AM, please contact  night-coverage [email or web address] Password [email or web address] 12/20/2015, 11:03 AM

## 2015-12-20 NOTE — Evaluation (Signed)
Physical Therapy Evaluation Patient Details Name: Wendy Kerr MRN: [id number] DOB: 12-12-1930 Today's Date: 12/20/2015   History of Present Illness  Wendy Petrovic Johnsonis a 82 y.o.femalewith medical history significant of CHF with EF 40-45%, CKD.  She was admitted due to severe abdominal pain, N/V.  Has had 50# weight loss in last few months.  Found to have enteritis.  Clinical Impression  Patient presents with decreased mobility due to deficits listed in PT problem list.  She will benefit from skilled PT in the acute setting to allow return home with family support and follow up HHPT.     Follow Up Recommendations Home health PT;Supervision for mobility/OOB    Equipment Recommendations  Rolling walker with 5" wheels    Recommendations for Other Services       Precautions / Restrictions Precautions Precautions: Fall      Mobility  Bed Mobility Overal bed mobility: Needs Assistance Bed Mobility: Supine to Sit     Supine to sit: Min assist     General bed mobility comments: assist for lifting trunk after pt attempted on her own couple of times.    Transfers Overall transfer level: Needs assistance Equipment used: Rolling walker (2 wheeled) Transfers: Sit to/from Stand Sit to Stand: Min guard         General transfer comment: cues for safe use of RW  Ambulation/Gait Ambulation/Gait assistance: Min guard Ambulation Distance (Feet): 225 Feet Assistive device: Rolling walker (2 wheeled) Gait Pattern/deviations: Step-through pattern;Decreased stride length     General Gait Details: increased distance for turns, assist for safety   Stairs            Wheelchair Mobility    Modified Rankin (Stroke Patients Only)       Balance Overall balance assessment: Needs assistance   Sitting balance-Leahy Scale: Good       Standing balance-Leahy Scale: Fair Standing balance comment: static balance steady, walker for safe ambulation                              Pertinent Vitals/Pain Pain Assessment: Faces Faces Pain Scale: Hurts even more Pain Location: abdomen Pain Descriptors / Indicators: Discomfort Pain Intervention(s): Monitored during session;Repositioned    Home Living Family/patient expects to be discharged to:: Private residence Living Arrangements: Alone Available Help at Discharge: Family;Available PRN/intermittently (grandson/grandaughter around a lot) Type of Home: House Home Access: Stairs to enter Entrance Stairs-Rails: Right;Left;Can reach both Entrance Stairs-Number of Steps: 4 Home Layout: One level Home Equipment: None;Grab bars - tub/shower;Shower seat      Prior Function Level of Independence: Independent               Hand Dominance        Extremity/Trunk Assessment               Lower Extremity Assessment: Generalized weakness         Communication   Communication: No difficulties  Cognition Arousal/Alertness: Awake/alert Behavior During Therapy: WFL for tasks assessed/performed Overall Cognitive Status: Within Functional Limits for tasks assessed                      General Comments      Exercises     Assessment/Plan    PT Assessment Patient needs continued PT services  PT Problem List Decreased strength;Decreased balance;Decreased mobility;Decreased activity tolerance;Decreased knowledge of use of DME;Decreased safety awareness  PT Treatment Interventions DME instruction;Gait training;Stair training;Functional mobility training;Balance training;Therapeutic exercise;Therapeutic activities;Patient/family education    PT Goals (Current goals can be found in the Care Plan section)  Acute Rehab PT Goals Patient Stated Goal: To return to independent PT Goal Formulation: With patient Time For Goal Achievement: 12/27/15 Potential to Achieve Goals: Good    Frequency Min 3X/week   Barriers to discharge        Co-evaluation                End of Session Equipment Utilized During Treatment: Gait belt Activity Tolerance: Patient limited by fatigue Patient left: in chair;with call bell/phone within reach      Functional Assessment Tool Used: Clinical Judgement Functional Limitation: Mobility: Walking and moving around Mobility: Walking and Moving Around Current Status [phone]): At least 20 percent but less than 40 percent impaired, limited or restricted Mobility: Walking and Moving Around Goal Status [phone]): At least 1 percent but less than 20 percent impaired, limited or restricted    Time: 1100-1123 PT Time Calculation (min) (ACUTE ONLY): 23 min   Charges:   PT Evaluation $PT Eval Moderate Complexity: 1 Procedure PT Treatments $Gait Training: 8-22 mins   PT G Codes:   PT G-Codes **NOT FOR INPATIENT CLASS** Functional Assessment Tool Used: Clinical Judgement Functional Limitation: Mobility: Walking and moving around Mobility: Walking and Moving Around Current Status [id number]): At least 20 percent but less than 40 percent impaired, limited or restricted Mobility: Walking and Moving Around Goal Status [phone]): At least 1 percent but less than 20 percent impaired, limited or restricted    Wendy Kerr 12/20/2015, 1:20 PM  Wendy Kerr, [place] 12/20/2015

## 2015-12-20 NOTE — Care Management Note (Signed)
Case Management Note  Patient Details  Name: Wendy Kerr MRN: [id number] Date of Birth: [birth year]  Subjective/Objective:                    Action/Plan: Discussed PT recommendations with patient . Patient declines HHPT at present if she changes her mind she will call her PCP to arrange. Patient states she can get a walker from her sister , she does not want NCM to order a walker. Patient does not want NCM to call her son.  Expected Discharge Date:                  Expected Discharge Plan:  Home/Self Care  In-House Referral:     Discharge planning Services  CM Consult  Post Acute Care Choice:  Durable Medical Equipment, Home Health Choice offered to:  Patient  DME Arranged:    DME Agency:     HH Arranged:  Patient Refused [place] Agency:     Status of Service:  Completed, signed off  If discussed at [employer], dates discussed:    Additional Comments:  Marilu Favre, RN 12/20/2015, 2:31 PM

## 2015-12-21 ENCOUNTER — Inpatient Hospital Stay (HOSPITAL_COMMUNITY): Payer: Medicare Other

## 2015-12-21 LAB — CBC
HCT: 40.9 % (ref 36.0–46.0)
HEMOGLOBIN: 14.7 g/dL (ref 12.0–15.0)
MCH: 27.7 pg (ref 26.0–34.0)
MCHC: 35.9 g/dL (ref 30.0–36.0)
MCV: 77 fL — AB (ref 78.0–100.0)
Platelets: 310 10*3/uL (ref 150–400)
RBC: 5.31 MIL/uL — AB (ref 3.87–5.11)
RDW: 14.6 % (ref 11.5–15.5)
WBC: 14.7 10*3/uL — ABNORMAL HIGH (ref 4.0–10.5)

## 2015-12-21 LAB — BASIC METABOLIC PANEL WITH GFR
Anion gap: 14 (ref 5–15)
BUN: 99 mg/dL — ABNORMAL HIGH (ref 6–20)
CO2: 24 mmol/L (ref 22–32)
Calcium: 9.6 mg/dL (ref 8.9–10.3)
Chloride: 95 mmol/L — ABNORMAL LOW (ref 101–111)
Creatinine, Ser: 2.35 mg/dL — ABNORMAL HIGH (ref 0.44–1.00)
GFR calc Af Amer: 21 mL/min — ABNORMAL LOW
GFR calc non Af Amer: 18 mL/min — ABNORMAL LOW
Glucose, Bld: 138 mg/dL — ABNORMAL HIGH (ref 65–99)
Potassium: 4.1 mmol/L (ref 3.5–5.1)
Sodium: 133 mmol/L — ABNORMAL LOW (ref 135–145)

## 2015-12-21 LAB — APTT: aPTT: 34 seconds (ref 24–36)

## 2015-12-21 LAB — HEPARIN LEVEL (UNFRACTIONATED): Heparin Unfractionated: >2.2 IU/mL — ABNORMAL HIGH (ref 0.30–0.70)

## 2015-12-21 LAB — GLUCOSE, CAPILLARY
GLUCOSE-CAPILLARY: 154 mg/dL — AB (ref 65–99)
GLUCOSE-CAPILLARY: 129 mg/dL — AB (ref 65–99)
Glucose-Capillary: 150 mg/dL — ABNORMAL HIGH (ref 65–99)
Glucose-Capillary: 116 mg/dL — ABNORMAL HIGH (ref 65–99)

## 2015-12-21 MED ORDER — FUROSEMIDE 10 MG/ML IJ SOLN: 40.0000 mg | Freq: Every day | INTRAMUSCULAR | Status: DC

## 2015-12-21 MED ORDER — HEPARIN (PORCINE) IN NACL 100-0.45 UNIT/ML-% IJ SOLN
1300.0000 [IU]/h | INTRAMUSCULAR | Status: DC
  Administered 2015-12-22: 1300 [IU]/h via INTRAVENOUS
  Administered 2015-12-22: 1000 [IU]/h via INTRAVENOUS
  Filled 2015-12-21 (×3): qty 250

## 2015-12-21 MED ORDER — DIATRIZOATE MEGLUMINE & SODIUM 66-10 % PO SOLN
90.0000 mL | Freq: Once | ORAL | Status: AC
  Administered 2015-12-21: 90 mL via NASOGASTRIC
  Filled 2015-12-21: qty 90

## 2015-12-21 MED ORDER — DIGOXIN 0.25 MG/ML IJ SOLN
0.0625 mg | INTRAMUSCULAR | Status: DC
  Filled 2015-12-21: qty 0.25

## 2015-12-21 NOTE — Progress Notes (Addendum)
ANTICOAGULATION CONSULT NOTE - Initial Consult  Pharmacy Consult for heparin Indication: atrial fibrillation  Allergies  Allergen Reactions  . Piroxicam Other (See Comments)    REACTION: HEADACHE    Patient Measurements: Height: 5\' 5"  (165.1 cm) Weight: 157 lb 6.4 oz (71.4 kg) IBW/kg (Calculated) : 57 Heparin Dosing Weight: 71.4 kg  Vital Signs: Temp: 98.1 F (36.7 C) (10/25 1300) Temp Source: Oral (10/25 1300) BP: 113/75 (10/25 1300) Pulse Rate: 92 (10/25 1300)  Labs:  Recent Labs  12/19/15 0339 12/20/15 0424 12/21/15 0401  HGB 15.4* 15.1* 14.7  HCT 43.3 43.5 40.9  PLT 305 306 310  CREATININE 2.28* 2.31* 2.35*    Estimated Creatinine Clearance: 18 mL/min (by C-G formula based on SCr of 2.35 mg/dL (H)).   Medical History: Past Medical History:  Diagnosis Date  . 1st degree AV block   . Anemia   . Atrial flutter ([place])   . Benign neoplasm of pancreas, except islets of Langerhans   . Bronchitis, mucopurulent recurrent ([place])   . Common migraine   . Diabetes mellitus ([place])   . Diverticulosis of colon   . DJD (degenerative joint disease)   . Generalized anxiety disorder   . Hypercholesterolemia   . Hypertension   . Monoclonal gammopathy   . Obesity   . OSA (obstructive sleep apnea)    severe with AHI 37 events per hour  . Vitamin D deficiency     Medications:  Scheduled:  . chlorhexidine  15 mL Mouth Rinse BID  . ciprofloxacin  400 mg Intravenous Q24H  . digoxin  0.0625 mg Intravenous Q M,W,F  . [START ON 12/22/2015] furosemide  40 mg Intravenous Daily  . insulin aspart  0-9 Units Subcutaneous TID WC  . isosorbide mononitrate  30 mg Oral Daily  . mouth rinse  15 mL Mouth Rinse q12n4p  . metronidazole  500 mg Intravenous Q8H  . multivitamin with minerals  1 tablet Oral q morning - 10a  . polyethylene glycol  17 g Oral Daily   Infusions:    Assessment: 82 yo female with afib will be transitioned from apixaban to heparin.  Last apixaban 2.5 mg dose  was on 12/21/15 at 1013.  Baseline aPTT 34 and heparin level is > 2.2  Goal of Therapy:  Heparin level 0.3-0.7 units/ml aPTT 66-102 seconds Monitor platelets by anticoagulation protocol: Yes   Plan:  - Start heparin drip at 1000 units/hr at midinight. No bolus - 8 hr heparin level and aPTT after drip is started - daily heparin level and CBC  Wendy Kerr, Tsz-Yin 12/21/2015,3:20 PM

## 2015-12-21 NOTE — Progress Notes (Signed)
Pt had vomited 3 x today, abd is soft, distended. Denies abd pain.  1845 NGT Fr12 inserted by Joaquim Lai C. Rn. Vanita Ingles gastric output noted. Connected to low intermittent suction.

## 2015-12-21 NOTE — Consult Note (Signed)
   [facility] CM Inpatient Consult   12/21/2015  Wendy Kerr August 02, 1930 [id number]    Patient screened for [place] Management services. Noted she has been followed by [facility] Heart Failure program. Martin Majestic to bedside to discuss [place] Management program. However, she states this is not a good time to talk as she is not feeling well. Asked that writer come back at later time. Will make inpatient RNCM of attempt to engage for [place] Management services.   Marthenia Rolling, MSN-Ed, RN,BSN [facility] Liaison [phone]

## 2015-12-21 NOTE — Progress Notes (Addendum)
PROGRESS NOTE    Wendy Kerr  [id number] DOB: [birth year] DOA: 12/18/2015 PCP: Hoyt Koch, MD   Outpatient Specialists:     Brief Narrative:  Wendy Kerr is a 82 y.o. female with medical history significant of CHF with EF 40-45%, CKD.  Patient presents to the ED with c/o abdominal pain, N/V.  Abdominal pain is throughout the abdomen and especially in lower abdomen.  Symptoms are severe.  No fever, chills, diarrhea, dysuria.  No blood in vomit or BM.  50lb weight loss over last couple of months.  Recent admit and treatment for CHF.  Today she ate fried chicken from a gas station and symptoms onset shortly there after.  Nothing makes symptoms better or worse.    Assessment & Plan:   Principal Problem:   Enteritis presumed infectious Active Problems:   Diabetes mellitus type 2 in obese [facility])   Essential hypertension   CKD stage 3 due to type 2 diabetes mellitus (HCC)   AKI (acute kidney injury) (HCC)   Nausea and vomiting   Enteritis presumed infectious - lactates  trending down (now WNL) doubt that this represents acute mesenteric ischemia/ischemic enteritis/ dead bowel zofran PRN Clears diet advanced to full Did have paracentesis for ascites a couple of weeks back, but absolutely no SIRS and no abdominal TTP at the moment (much less peritoneal signs) to suggest SBP.  Now has very little ascites. - had WBC count added IV abx (cipro/flagyl   -x ray shows dilated loops of small bowel-- surgical consult-- ? NG Tube placement vs continued observation  DM2 - appears to be diet controlled -SSI  HTN - continue home meds  Chronic diastolic CHF - -resumed at lower dose  AKI on CKD - follow BMP   DVT prophylaxis:  SCD's  Code Status: Full Code   Family Communication: Patient/son on phone  Disposition Plan:  Home 1-2 days   Consultants:   surgery   Subjective: Had small bowel movement yesterday Some nausea after eating but not all the  time  Objective: Vitals:   12/20/15 1428 12/20/15 2149 12/21/15 0532 12/21/15 1020  BP: 134/86 138/88 135/82 (!) 132/91  Pulse: 89 96 93 90  Resp: 16 16 17    Temp: 98 F (36.7 C) 98.1 F (36.7 C) 98 F (36.7 C)   TempSrc: Oral Axillary Oral   SpO2: 97% 98% 92%   Weight:      Height:        Intake/Output Summary (Last 24 hours) at 12/21/15 1057 Last data filed at 12/21/15 0533  Gross per 24 hour  Intake              500 ml  Output             1150 ml  Net             -650 ml   Filed Weights   12/19/15 0119  Weight: 71.4 kg (157 lb 6.4 oz)    Examination:  General exam: NAD Respiratory system: Clear to auscultation. Respiratory effort normal. Cardiovascular system: S1 & S2 heard, RRR. No JVD, murmurs, rubs, gallops or clicks. No pedal edema. Gastrointestinal system: Abdomen is  Mildly distended, soft and nontender. No organomegaly or masses felt.  Decreased bowel sounds heard Central nervous system: alert and oriented    Data Reviewed: I have personally reviewed following labs and imaging studies  CBC:  Recent Labs Lab 12/18/15 1900 12/19/15 0339 12/20/15 0424 12/21/15 0401  WBC 8.5 11.0*  19.4* 14.7*  HGB 15.0 15.4* 15.1* 14.7  HCT 41.9 43.3 43.5 40.9  MCV 77.7* 77.9* 79.5 77.0*  PLT 328 305 306 [id number]   Basic Metabolic Panel:  Recent Labs Lab 12/18/15 1900 12/19/15 0339 12/20/15 0424 12/21/15 0401  NA 132* 136 134* 133*  K 4.9 3.4* 4.0 4.1  CL 92* 96* 94* 95*  CO2 19* 24 24 24   GLUCOSE 199* 228* 160* 138*  BUN 118* 112* 108* 99*  CREATININE 2.54* 2.28* 2.31* 2.35*  CALCIUM 9.6 9.5 9.3 9.6   GFR: Estimated Creatinine Clearance: 18 mL/min (by C-G formula based on SCr of 2.35 mg/dL (H)). Liver Function Tests:  Recent Labs Lab 12/18/15 1900  AST 45*  ALT 27  ALKPHOS 130*  BILITOT 1.0  PROT 7.9  ALBUMIN 4.6    Recent Labs Lab 12/18/15 1900  LIPASE 48   No results for input(s): AMMONIA in the last 168 hours. Coagulation Profile: No  results for input(s): INR, PROTIME in the last 168 hours. Cardiac Enzymes: No results for input(s): CKTOTAL, CKMB, CKMBINDEX, TROPONINI in the last 168 hours. BNP (last 3 results)  Recent Labs  11/09/15 1455  PROBNP 1,559.0*   HbA1C: No results for input(s): HGBA1C in the last 72 hours. CBG:  Recent Labs Lab 12/20/15 0734 12/20/15 1206 12/20/15 1628 12/20/15 2144 12/21/15 0804  GLUCAP 140* 174* 187* 85 154*   Lipid Profile: No results for input(s): CHOL, HDL, LDLCALC, TRIG, CHOLHDL, LDLDIRECT in the last 72 hours. Thyroid Function Tests: No results for input(s): TSH, T4TOTAL, FREET4, T3FREE, THYROIDAB in the last 72 hours. Anemia Panel: No results for input(s): VITAMINB12, FOLATE, FERRITIN, TIBC, IRON, RETICCTPCT in the last 72 hours. Urine analysis:    Component Value Date/Time   COLORURINE YELLOW 12/18/2015 2239   APPEARANCEUR CLEAR 12/18/2015 2239   LABSPEC 1.011 12/18/2015 2239   PHURINE 5.5 12/18/2015 2239   GLUCOSEU NEGATIVE 12/18/2015 2239   GLUCOSEU Color Interference (A) 08/15/2015 1245   HGBUR SMALL (A) 12/18/2015 2239   BILIRUBINUR NEGATIVE 12/18/2015 2239   KETONESUR NEGATIVE 12/18/2015 2239   PROTEINUR 100 (A) 12/18/2015 2239   UROBILINOGEN Color Interference (A) 08/15/2015 1245   NITRITE NEGATIVE 12/18/2015 2239   LEUKOCYTESUR NEGATIVE 12/18/2015 2239     )No results found for this or any previous visit (from the past 240 hour(s)).    Anti-infectives    Start     Dose/Rate Route Frequency Ordered Stop   12/19/15 1400  metroNIDAZOLE (FLAGYL) IVPB 500 mg     500 mg 100 mL/hr over 60 Minutes Intravenous Every 8 hours 12/19/15 1257     12/19/15 1400  ciprofloxacin (CIPRO) IVPB 400 mg     400 mg 200 mL/hr over 60 Minutes Intravenous Every 24 hours 12/19/15 1307         Radiology Studies: No results found.      Scheduled Meds: . apixaban  2.5 mg Oral BID  . atorvastatin  20 mg Oral q1800  . chlorhexidine  15 mL Mouth Rinse BID  .  ciprofloxacin  400 mg Intravenous Q24H  . digoxin  0.0625 mg Oral Q M,W,F  . diltiazem  120 mg Oral QHS  . furosemide  40 mg Oral BID  . insulin aspart  0-9 Units Subcutaneous TID WC  . isosorbide mononitrate  30 mg Oral Daily  . mouth rinse  15 mL Mouth Rinse q12n4p  . metronidazole  500 mg Intravenous Q8H  . multivitamin with minerals  1 tablet Oral q morning - 10a  .  polyethylene glycol  17 g Oral Daily   Continuous Infusions:    LOS: 1 day    Time spent: 25 min    [place], DO [facility] Pager [phone]  If 7PM-7AM, please contact night-coverage [email or web address] Password [email or web address] 12/21/2015, 10:57 AM

## 2015-12-21 NOTE — Consult Note (Signed)
Reason for Consult: Possible SBO/enteritis;  presenting with abdominal pain. Referring Physician: Dr Michelene Gardener is an 82 y.o. female.  HPI: 82 year old female presented to the Urgent Care on 12/18/2015 with complaints of abdominal pain. She also reported weight loss but if you talk with her this all sounds like it is related to [facility] and the loss is thru diuresis and paracentesis.  She was then referred to the ED. Work up shows she was afebrile, VSS, creatinine was 2.28, glucose was 228.  WBC was 11,000 but went up to 19.4 the following AM.  UA and fecal occult were unremarkable.  CT scan on 12/18/15 shows: Nondistended fluid-filled loops of small bowel may represent enteritis. Clinical correlation is recommended. No bowel obstruction.  Colonic diverticulosis without active inflammatory changes.  Hypo attenuating mass in the body of the pancreas similar to the prior CT. No gland atrophy or ductal dilatation.  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  She was admitted by Medicine with a tentative diagnosis of enteritis. She is placed on gentle IV fluid hydration diuretics for her chronic systolic and diastolic congestive heart failure placed on hold. She was placed on some IV Cipro and Flagyl 12/19/15 and has had 2 days completed therapy so far.  Plain film today shows:Mild gastric distention. Prominent dilated loops of proximal small bowel are noted. Small-bowel obstruction cannot be excluded.  She remains afebrile, VSS, Diastolic BP is upper limits. Creatinine is stable a 2.35, WBC down some at 14.7.  We are ask to see and help rule out SBO. She has Hx of Mild gastric distention. Prominent dilated loops of proximal small bowel are noted.  Small-bowel obstruction cannot be excluded. She is on day 3 of Cipro/Flagyl.  She has a hx of BEN NEOPLASM [place] (ICD-211.6) - she has been followed at [facility] by Dr. Dorothea Ogle for this. She undergoes CT scan  for this each year. Tentative plans to move her treatment to [facility]. This has never been resected. She has a history of prior partial hysterectomy, this is her only abdominal surgery.    Past Medical History:  Diagnosis Date  . 1st degree AV block   . Anemia   . Atrial flutter ([place])   . Benign neoplasm of pancreas, except islets of Langerhans   . Bronchitis, mucopurulent recurrent ([place])   . Common migraine   . Diabetes mellitus ([place])   . Diverticulosis of colon   . DJD (degenerative joint disease)   . Generalized anxiety disorder   . Hypercholesterolemia   . Hypertension   . Monoclonal gammopathy   . Obesity   . OSA (obstructive sleep apnea)    severe with AHI 37 events per hour  . Vitamin D deficiency     Past Surgical History:  Procedure Laterality Date  . ABDOMINAL HYSTERECTOMY  1972  . resection serous cystadenoma of pancreas  2004   [facility].  Now (2017) with Dr Bridgett Larsson with [facility]    Family History  Problem Relation Age of Onset  . Hypertension Mother   . Kidney failure Mother     dialysis  . Heart Problems Mother 79  . Hypertension Father   . Kidney failure Father     dialysis  . Heart Problems Father 46  . Kidney disease Sister   . Kidney disease Other     Social History:  reports that she has never smoked. She has never used smokeless tobacco. She  reports that she does not drink alcohol or use drugs.  Allergies:  Allergies  Allergen Reactions  . Piroxicam Other (See Comments)    REACTION: HEADACHE    Prior to Admission medications   Medication Sig Start Date End Date Taking? Authorizing Provider  atorvastatin (LIPITOR) 20 MG tablet Take 1 tablet (20 mg total) by mouth daily at 6 PM. 11/23/15  Yes Cheryln Manly, NP  digoxin (LANOXIN) 0.125 MG tablet Take 0.5 tablets (0.0625 mg total) by mouth every Monday, Wednesday, and Friday. 09/26/15  Yes Belva Crome, MD  diltiazem (CARDIZEM CD) 120 MG 24 hr capsule Take 1  capsule (120 mg total) by mouth at bedtime. 11/23/15  Yes Cheryln Manly, NP  ELIQUIS 2.5 MG TABS tablet TAKE 1 TABLET BY MOUTH 2 TIMES DAILY. Patient taking differently: Take 2.5 mg by mouth twice daily 07/18/15  Yes Belva Crome, MD  furosemide (LASIX) 80 MG tablet Take 80 mg by mouth 2 (two) times daily.   Yes Historical Provider, MD  hydrALAZINE (APRESOLINE) 25 MG tablet Take 25 mg by mouth 3 (three) times daily.   Yes Historical Provider, MD  isosorbide mononitrate (IMDUR) 60 MG 24 hr tablet Take 0.5 tablets (30 mg total) by mouth daily. 11/23/15  Yes Cheryln Manly, NP  metolazone (ZAROXOLYN) 2.5 MG tablet Take 2.5 mg by mouth daily.   Yes Historical Provider, MD  Multiple Vitamin (MULTIVITAMIN WITH MINERALS) TABS tablet Take 1 tablet by mouth every morning.   Yes Historical Provider, MD  OVER THE COUNTER MEDICATION Take 1 tablet by mouth daily. MED NAME: IRON   Yes Historical Provider, MD  nitroGLYCERIN (NITROSTAT) 0.4 MG SL tablet Place 1 tablet (0.4 mg total) under the tongue every 5 (five) minutes as needed for chest pain. 08/13/14   Hoyt Koch, MD     Results for orders placed or performed during the hospital encounter of 12/18/15 (from the past 48 hour(s))  Glucose, capillary     Status: Abnormal   Collection Time: 12/19/15 12:21 PM  Result Value Ref Range   Glucose-Capillary 188 (H) 65 - 99 mg/dL   Comment 1 Notify RN   Glucose, capillary     Status: Abnormal   Collection Time: 12/19/15  4:07 PM  Result Value Ref Range   Glucose-Capillary 190 (H) 65 - 99 mg/dL   Comment 1 Notify RN   Glucose, capillary     Status: Abnormal   Collection Time: 12/19/15  5:11 PM  Result Value Ref Range   Glucose-Capillary 206 (H) 65 - 99 mg/dL   Comment 1 Notify RN   Glucose, capillary     Status: Abnormal   Collection Time: 12/19/15  9:02 PM  Result Value Ref Range   Glucose-Capillary 157 (H) 65 - 99 mg/dL  CBC     Status: Abnormal   Collection Time: 12/20/15  4:24 AM   Result Value Ref Range   WBC 19.4 (H) 4.0 - 10.5 K/uL   RBC 5.47 (H) 3.87 - 5.11 MIL/uL   Hemoglobin 15.1 (H) 12.0 - 15.0 g/dL   HCT 43.5 36.0 - 46.0 %   MCV 79.5 78.0 - 100.0 fL   MCH 27.6 26.0 - 34.0 pg   MCHC 34.7 30.0 - 36.0 g/dL   RDW 15.3 11.5 - 15.5 %   Platelets 306 150 - 400 K/uL  Basic metabolic panel     Status: Abnormal   Collection Time: 12/20/15  4:24 AM  Result Value Ref Range  Sodium 134 (L) 135 - 145 mmol/L   Potassium 4.0 3.5 - 5.1 mmol/L   Chloride 94 (L) 101 - 111 mmol/L   CO2 24 22 - 32 mmol/L   Glucose, Bld 160 (H) 65 - 99 mg/dL   BUN 108 (H) 6 - 20 mg/dL   Creatinine, Ser 2.31 (H) 0.44 - 1.00 mg/dL   Calcium 9.3 8.9 - 10.3 mg/dL   GFR calc non Af Amer 18 (L) >60 mL/min   GFR calc Af Amer 21 (L) >60 mL/min    Comment: (NOTE) The eGFR has been calculated using the CKD EPI equation. This calculation has not been validated in all clinical situations. eGFR's persistently <60 mL/min signify possible Chronic Kidney Disease.    Anion gap 16 (H) 5 - 15  Glucose, capillary     Status: Abnormal   Collection Time: 12/20/15  7:34 AM  Result Value Ref Range   Glucose-Capillary 140 (H) 65 - 99 mg/dL   Comment 1 Notify RN   Glucose, capillary     Status: Abnormal   Collection Time: 12/20/15 12:06 PM  Result Value Ref Range   Glucose-Capillary 174 (H) 65 - 99 mg/dL   Comment 1 Notify RN   Glucose, capillary     Status: Abnormal   Collection Time: 12/20/15  4:28 PM  Result Value Ref Range   Glucose-Capillary 187 (H) 65 - 99 mg/dL   Comment 1 Notify RN   Glucose, capillary     Status: None   Collection Time: 12/20/15  9:44 PM  Result Value Ref Range   Glucose-Capillary 85 65 - 99 mg/dL  CBC     Status: Abnormal   Collection Time: 12/21/15  4:01 AM  Result Value Ref Range   WBC 14.7 (H) 4.0 - 10.5 K/uL   RBC 5.31 (H) 3.87 - 5.11 MIL/uL   Hemoglobin 14.7 12.0 - 15.0 g/dL   HCT 40.9 36.0 - 46.0 %   MCV 77.0 (L) 78.0 - 100.0 fL   MCH 27.7 26.0 - 34.0 pg    MCHC 35.9 30.0 - 36.0 g/dL   RDW 14.6 11.5 - 15.5 %   Platelets 310 150 - 400 K/uL  Basic metabolic panel     Status: Abnormal   Collection Time: 12/21/15  4:01 AM  Result Value Ref Range   Sodium 133 (L) 135 - 145 mmol/L   Potassium 4.1 3.5 - 5.1 mmol/L   Chloride 95 (L) 101 - 111 mmol/L   CO2 24 22 - 32 mmol/L   Glucose, Bld 138 (H) 65 - 99 mg/dL   BUN 99 (H) 6 - 20 mg/dL   Creatinine, Ser 2.35 (H) 0.44 - 1.00 mg/dL   Calcium 9.6 8.9 - 10.3 mg/dL   GFR calc non Af Amer 18 (L) >60 mL/min   GFR calc Af Amer 21 (L) >60 mL/min    Comment: (NOTE) The eGFR has been calculated using the CKD EPI equation. This calculation has not been validated in all clinical situations. eGFR's persistently <60 mL/min signify possible Chronic Kidney Disease.    Anion gap 14 5 - 15  Glucose, capillary     Status: Abnormal   Collection Time: 12/21/15  8:04 AM  Result Value Ref Range   Glucose-Capillary 154 (H) 65 - 99 mg/dL  Glucose, capillary     Status: Abnormal   Collection Time: 12/21/15 11:59 AM  Result Value Ref Range   Glucose-Capillary 150 (H) 65 - 99 mg/dL    Dg Abd Portable 1v  Result Date: 12/21/2015 CLINICAL DATA:  Nausea vomiting.  Constipation. EXAM: PORTABLE ABDOMEN - 1 VIEW COMPARISON:  CT 12/18/2015. FINDINGS: Soft tissue structures are unremarkable. Mild gastric distention. Prominent dilated loops of proximal small bowel are noted. Small-bowel obstruction cannot be excluded. Abdominal and pelvic calcifications consistent with phleboliths. Degenerative changes lumbar spine and both hips. IMPRESSION: Mild gastric distention. Prominent dilated loops of proximal small bowel noted. Small-bowel obstruction cannot be excluded. Electronically Signed   By: Marcello Moores  Register   On: 12/21/2015 11:32    Review of Systems  Constitutional: Positive for weight loss (this appears to be fluid related and secondary to diuresis and paracentesis). Negative for diaphoresis and malaise/fatigue.       She  lives independently, drives and takes her sisters to appointments.  HENT: Negative.   Eyes: Negative.   Respiratory: Positive for cough (dry). Negative for hemoptysis, sputum production, shortness of breath and wheezing.   Cardiovascular: Negative.   Gastrointestinal: Positive for abdominal pain, constipation (No BM for 3 days.), nausea and vomiting. Negative for blood in stool, diarrhea, heartburn and melena.  Genitourinary: Negative.   Musculoskeletal: Negative.   Skin: Negative.   Neurological: Negative.  Negative for weakness.  Endo/Heme/Allergies: Negative.   Psychiatric/Behavioral: Negative.    Blood pressure (!) 132/91, pulse 90, temperature 98 F (36.7 C), temperature source Oral, resp. rate 17, height '5\' 5"'  (1.651 m), weight 71.4 kg (157 lb 6.4 oz), SpO2 92 %. Physical Exam  Constitutional: She appears well-developed and well-nourished. No distress.  Comfortable sitting up in chair. Blood pressure 133/89. Oxygen saturations 97% on room air, heart rate is regular 94-103 currently.  HENT:  Head: Normocephalic and atraumatic.  Nose: Nose normal.  Eyes: Right eye exhibits no discharge. Left eye exhibits no discharge.  Neck: JVD (Minimal) present. No tracheal deviation present. No thyromegaly present.  Cardiovascular: Normal rate, regular rhythm, normal heart sounds and intact distal pulses.   Respiratory: Effort normal. No respiratory distress. She has no wheezes. She has rales (Both bases). She exhibits no tenderness.  GI: Soft. Bowel sounds are normal. She exhibits distension. She exhibits no mass. There is tenderness (She is more sore than tender.). There is no rebound and no guarding.  Mildly distended, has a few bowel sounds. She is more sore than tender. Comfortable sitting up in the chair. She reports belching, vomiting small amounts at a time, but multiple times during the last 3 days. No BM 3 days, no flatus. She is on a full liquid tray.  Mid abdominal incision midline  below the umbilicus from prior partial hysterectomy.  Musculoskeletal: She exhibits no edema.  Currently no edema lower extremities, in the hospital.  Neurological: She is alert. No cranial nerve deficit.  Skin: Skin is warm and dry. No rash noted. She is not diaphoretic. No erythema. No pallor.  Psychiatric: She has a normal mood and affect. Her behavior is normal. Judgment and thought content normal.    Assessment/Plan: Abdominal pain with Enteritis versus SBO - only surgery status post partial hysterectomy 1972 History of benign neoplasm of pancreas, found with SBO 11/11/2001 History of atrial flutter; diastolic and systolic congestive heart failure(2-D echo 05/2814 with EF 50-55% and grade 2 diastolic dysfunction) Chronic kidney disease stage III - creatinine 2.35 AODM History of obstructive sleep apnea Hypertension Hypercholesterolemia   Plan:  I would make her nothing by mouth except for ice chips, continue to mobilize. If she vomits again I would recommend placing an NG tube for gastric decompression. I think she  would benefit from an NG tube placement and small bowel protocol.  I will discuss with Dr. Hulen Skains and Dr. Eliseo Squires.    Malia Corsi 12/21/2015, 12:12 PM

## 2015-12-21 NOTE — Progress Notes (Signed)
PT Cancellation Note  Patient Details Name: Wendy Kerr MRN: [id number] DOB: 10/23/1930   Cancelled Treatment:    Reason Eval/Treat Not Completed: Medical issues which prohibited therapy; checked on pt twice, was dealing with N&V both times.  Reports awaiting NG to suction to improve symptoms.  Will attempt tomorrow.   Reginia Naas 12/21/2015, 3:15 PM  Magda Kiel, [place] 12/21/2015

## 2015-12-22 ENCOUNTER — Other Ambulatory Visit: Payer: Self-pay

## 2015-12-22 ENCOUNTER — Inpatient Hospital Stay (HOSPITAL_COMMUNITY): Payer: Medicare Other

## 2015-12-22 DIAGNOSIS — K56609 Unspecified intestinal obstruction, unspecified as to partial versus complete obstruction: Principal | ICD-10-CM

## 2015-12-22 DIAGNOSIS — E1122 Type 2 diabetes mellitus with diabetic chronic kidney disease: Secondary | ICD-10-CM

## 2015-12-22 DIAGNOSIS — T460X1A Poisoning by cardiac-stimulant glycosides and drugs of similar action, accidental (unintentional), initial encounter: Secondary | ICD-10-CM

## 2015-12-22 DIAGNOSIS — I5042 Chronic combined systolic (congestive) and diastolic (congestive) heart failure: Secondary | ICD-10-CM

## 2015-12-22 DIAGNOSIS — Z01810 Encounter for preprocedural cardiovascular examination: Secondary | ICD-10-CM

## 2015-12-22 DIAGNOSIS — N183 Chronic kidney disease, stage 3 (moderate): Secondary | ICD-10-CM

## 2015-12-22 LAB — HEPARIN LEVEL (UNFRACTIONATED)

## 2015-12-22 LAB — BASIC METABOLIC PANEL
Anion gap: 16 — ABNORMAL HIGH (ref 5–15)
BUN: 106 mg/dL — AB (ref 6–20)
CALCIUM: 9.4 mg/dL (ref 8.9–10.3)
CO2: 25 mmol/L (ref 22–32)
CREATININE: 2.77 mg/dL — AB (ref 0.44–1.00)
Chloride: 92 mmol/L — ABNORMAL LOW (ref 101–111)
GFR calc Af Amer: 17 mL/min — ABNORMAL LOW (ref >60)
GFR calc non Af Amer: 15 mL/min — ABNORMAL LOW (ref >60)
GLUCOSE: 125 mg/dL — AB (ref 65–99)
Potassium: 4 mmol/L (ref 3.5–5.1)
Sodium: 133 mmol/L — ABNORMAL LOW (ref 135–145)

## 2015-12-22 LAB — CBC
HCT: 40.2 % (ref 36.0–46.0)
Hemoglobin: 14.5 g/dL (ref 12.0–15.0)
MCH: 27.7 pg (ref 26.0–34.0)
MCHC: 36.1 g/dL — ABNORMAL HIGH (ref 30.0–36.0)
MCV: 76.7 fL — AB (ref 78.0–100.0)
PLATELETS: 335 10*3/uL (ref 150–400)
RBC: 5.24 MIL/uL — AB (ref 3.87–5.11)
RDW: 14.3 % (ref 11.5–15.5)
WBC: 9.5 10*3/uL (ref 4.0–10.5)

## 2015-12-22 LAB — GLUCOSE, CAPILLARY
GLUCOSE-CAPILLARY: 143 mg/dL — AB (ref 65–99)
GLUCOSE-CAPILLARY: 127 mg/dL — AB (ref 65–99)
GLUCOSE-CAPILLARY: 116 mg/dL — AB (ref 65–99)
Glucose-Capillary: 127 mg/dL — ABNORMAL HIGH (ref 65–99)
Glucose-Capillary: 129 mg/dL — ABNORMAL HIGH (ref 65–99)
Glucose-Capillary: 129 mg/dL — ABNORMAL HIGH (ref 65–99)

## 2015-12-22 LAB — APTT
aPTT: 58 seconds — ABNORMAL HIGH (ref 24–36)
aPTT: 65 seconds — ABNORMAL HIGH (ref 24–36)

## 2015-12-22 LAB — BRAIN NATRIURETIC PEPTIDE: B Natriuretic Peptide: 2045.4 pg/mL — ABNORMAL HIGH (ref 0.0–100.0)

## 2015-12-22 LAB — DIGOXIN LEVEL: DIGOXIN LVL: 0.4 ng/mL — AB (ref 0.8–2.0)

## 2015-12-22 LAB — TROPONIN I: Troponin I: 0.99 ng/mL (ref <0.03)

## 2015-12-22 MED ORDER — HYDRALAZINE HCL 20 MG/ML IJ SOLN: 10.0000 mg | Freq: Three times a day (TID) | INTRAMUSCULAR | Status: DC | PRN

## 2015-12-22 MED ORDER — INSULIN ASPART 100 UNIT/ML ~~LOC~~ SOLN
0.0000 [IU] | SUBCUTANEOUS | Status: DC
  Administered 2015-12-22 – 2015-12-24 (×11): 1 [IU] via SUBCUTANEOUS

## 2015-12-22 MED ORDER — SODIUM CHLORIDE 0.9 % IV SOLN
INTRAVENOUS | Status: DC
  Administered 2015-12-22 – 2015-12-26 (×6): via INTRAVENOUS

## 2015-12-22 NOTE — Consult Note (Signed)
   [facility] CM Inpatient Consult   12/22/2015  Wendy Kerr Jan 24, 1931 [id number]  Came by to follow up with the patient regarding community care management for restart of services.  Patient was sitting up in chair.  NG tube noted and patient states, "I am not feeling much better but I will given my son your information."  Left the patient a brochure with [place] Management information and contact information.  For referrals or questions, please contact:  Natividad Brood, RN BSN [facility] Liaison  [phone] business mobile phone Toll free office [phone]

## 2015-12-22 NOTE — Progress Notes (Signed)
[address] for heparin Indication: atrial fibrillation  Allergies  Allergen Reactions  . Piroxicam Other (See Comments)    REACTION: HEADACHE    Patient Measurements: Height: 5\' 5"  (165.1 cm) Weight: 157 lb 6.4 oz (71.4 kg) IBW/kg (Calculated) : 57 Heparin Dosing Weight: 71.4 kg  Vital Signs: Temp: 97.8 F (36.6 C) (10/26 0455) Temp Source: Oral (10/25 2254) BP: 132/73 (10/26 0455) Pulse Rate: 97 (10/26 0455)  Labs:  Recent Labs  12/20/15 0424 12/21/15 0401 12/21/15 1537 12/22/15 0516 12/22/15 0806  HGB 15.1* 14.7  --   --  14.5  HCT 43.5 40.9  --   --  40.2  PLT 306 310  --   --  335  APTT  --   --  34  --  58*  HEPARINUNFRC  --   --  >2.20*  --  >2.20*  CREATININE 2.31* 2.35*  --  2.77*  --     Estimated Creatinine Clearance: 15.3 mL/min (by C-G formula based on SCr of 2.77 mg/dL (H)).  Assessment: 82 yo female with afib transitioned from apixaban to heparin while NPO for enteritis vs SB O.  Last apixaban 2.5 mg dose was on 12/21/15 at 1013.  Baseline aPTT 34 and heparin level is > 2.2 Heparin drip started with no bolus at 0018 am and aPTT drawn at 8am = 58 sec which is below goal of 66-102.  Heparin level elevated at > 2.2 due to effect of eliquis.  CBC stable, no bleeding reported.   Goal of Therapy:  Heparin level 0.3-0.7 units/ml aPTT 66-102 seconds Monitor platelets by anticoagulation protocol: Yes   Plan:  - increase heparin to 1150 units/hr - 8 hr heparin level and aPTT after rate increase - daily heparin level and CBC  Eudelia Bunch, Pharm.D. BP:[id number] 12/22/2015 9:59 AM

## 2015-12-22 NOTE — Progress Notes (Addendum)
CRITICAL VALUE ALERT  Critical value received:  0.99 Troponin  Date of notification:  12/22/2015  Time of notification:2010  Critical value read back:Yes.     Nurse who received alert:  S. Elizebeth Brooking  MD notified (1st page):  Hijazi  Time of first page:  2030  MD notified (2nd page):Dr. Harrington Challenger  Time of second page:2055  Responding MD: Dr. Harrington Challenger  Time MD responded: 2100

## 2015-12-22 NOTE — Progress Notes (Signed)
PROGRESS NOTE    Wendy Kerr  [id number] DOB: [birth year] DOA: 12/18/2015 PCP: Hoyt Koch, MD   Outpatient Specialists:     Brief Narrative:  Wendy Kerr is a 82 y.o. female with medical history significant of CHF with EF 40-45%, CKD.  Patient presents to the ED with c/o abdominal pain, N/V.  Abdominal pain is throughout the abdomen and especially in lower abdomen.  Symptoms are severe.  No fever, chills, diarrhea, dysuria.  No blood in vomit or BM.  50lb weight loss over last couple of months.  Recent admit and treatment for CHF.  she ate fried chicken from a gas station and symptoms onset shortly there after.  Nothing makes symptoms better or worse.  CT abd pelvis suggestive of enteritis, no contrast used due to kidney function, lactate trending down and is now WNL on recheck, pain control achieved with IV dilaudid.  Creatinine is 2.5 (essentially unchanged from recent labs).   Assessment & Plan:   Principal Problem:   Enteritis presumed infectious Active Problems:   Diabetes mellitus type 2 in obese [facility])   Essential hypertension   CKD stage 3 due to type 2 diabetes mellitus (HCC)   AKI (acute kidney injury) (HCC)   Nausea and vomiting  1- SBO;  Initially patient was thought to have enteritis. She continue to vomit. Repeat KUB with dilation Small bowel.  Surgery consulted. Patient had NG tube placed yielding 3 L of fluid from stomach.  NPO. Start low rate IV fluids.  On IV cipro and flagyl.   DM2 - appears to be diet controlled -SSI  HTN - PRN hydralazine.   Cronic diastolic CHF - -hold imdur, unable to tolerates oral.  -Hold IV lasix . Cr increasing.  -add telemetry.  -unable to to take oral Cardizem.  -cardiology consulted, pre op evaluation  And management.   AKI on CKD - follow BMP Hold lasix.  Low rate IV fluids.  A fib; was on eliquis.  On IV heparin in case patient required  of surgical procedure.  Monitor on telemetry.  If hr  increases might need to be on Cardizem gtt   DVT prophylaxis:  SCD's  Code Status: Full Code   Family Communication: Patient   Disposition Plan:  To be determine, await resolution of SBO.    Consultants:   Surgery  Cardiology    Subjective: Complaining of abdominal discomfort.  Denies dyspnea.  3 L removed after NG tube was place.   Objective: Vitals:   12/21/15 1020 12/21/15 1300 12/21/15 2254 12/22/15 0455  BP: (!) 132/91 113/75 118/69 132/73  Pulse: 90 92 94 97  Resp:   16 15  Temp:  98.1 F (36.7 C) 98.7 F (37.1 C) 97.8 F (36.6 C)  TempSrc:  Oral Oral   SpO2:  99% 96% 97%  Weight:      Height:        Intake/Output Summary (Last 24 hours) at 12/22/15 1031 Last data filed at 12/22/15 0700  Gross per 24 hour  Intake           694.83 ml  Output             3000 ml  Net         -2305.17 ml   Filed Weights   12/19/15 0119  Weight: 71.4 kg (157 lb 6.4 oz)    Examination:  General exam: NAD, NGT in place Respiratory system: Clear to auscultation. Respiratory effort normal. Cardiovascular system: S1 &  S2 heard, RRR. No JVD, murmurs, rubs, gallops or clicks. No pedal edema. Gastrointestinal system: Abdomen is   distended, soft and mild tender. No organomegaly or masses felt.  Decreased bowel sounds heard,  Central nervous system: alert and oriented    Data Reviewed: I have personally reviewed following labs and imaging studies  CBC:  Recent Labs Lab 12/18/15 1900 12/19/15 0339 12/20/15 0424 12/21/15 0401 12/22/15 0806  WBC 8.5 11.0* 19.4* 14.7* 9.5  HGB 15.0 15.4* 15.1* 14.7 14.5  HCT 41.9 43.3 43.5 40.9 40.2  MCV 77.7* 77.9* 79.5 77.0* 76.7*  PLT 328 305 306 310 [id number]   Basic Metabolic Panel:  Recent Labs Lab 12/18/15 1900 12/19/15 0339 12/20/15 0424 12/21/15 0401 12/22/15 0516  NA 132* 136 134* 133* 133*  K 4.9 3.4* 4.0 4.1 4.0  CL 92* 96* 94* 95* 92*  CO2 19* 24 24 24 25   GLUCOSE 199* 228* 160* 138* 125*  BUN 118* 112* 108*  99* 106*  CREATININE 2.54* 2.28* 2.31* 2.35* 2.77*  CALCIUM 9.6 9.5 9.3 9.6 9.4   GFR: Estimated Creatinine Clearance: 15.3 mL/min (by C-G formula based on SCr of 2.77 mg/dL (H)). Liver Function Tests:  Recent Labs Lab 12/18/15 1900  AST 45*  ALT 27  ALKPHOS 130*  BILITOT 1.0  PROT 7.9  ALBUMIN 4.6    Recent Labs Lab 12/18/15 1900  LIPASE 48   No results for input(s): AMMONIA in the last 168 hours. Coagulation Profile: No results for input(s): INR, PROTIME in the last 168 hours. Cardiac Enzymes: No results for input(s): CKTOTAL, CKMB, CKMBINDEX, TROPONINI in the last 168 hours. BNP (last 3 results)  Recent Labs  11/09/15 1455  PROBNP 1,559.0*   HbA1C: No results for input(s): HGBA1C in the last 72 hours. CBG:  Recent Labs Lab 12/21/15 1612 12/21/15 2045 12/22/15 0030 12/22/15 0448 12/22/15 0805  GLUCAP 129* 116* 129* 143* 127*   Lipid Profile: No results for input(s): CHOL, HDL, LDLCALC, TRIG, CHOLHDL, LDLDIRECT in the last 72 hours. Thyroid Function Tests: No results for input(s): TSH, T4TOTAL, FREET4, T3FREE, THYROIDAB in the last 72 hours. Anemia Panel: No results for input(s): VITAMINB12, FOLATE, FERRITIN, TIBC, IRON, RETICCTPCT in the last 72 hours. Urine analysis:    Component Value Date/Time   COLORURINE YELLOW 12/18/2015 2239   APPEARANCEUR CLEAR 12/18/2015 2239   LABSPEC 1.011 12/18/2015 2239   PHURINE 5.5 12/18/2015 2239   GLUCOSEU NEGATIVE 12/18/2015 2239   GLUCOSEU Color Interference (A) 08/15/2015 1245   HGBUR SMALL (A) 12/18/2015 2239   BILIRUBINUR NEGATIVE 12/18/2015 2239   KETONESUR NEGATIVE 12/18/2015 2239   PROTEINUR 100 (A) 12/18/2015 2239   UROBILINOGEN Color Interference (A) 08/15/2015 1245   NITRITE NEGATIVE 12/18/2015 2239   LEUKOCYTESUR NEGATIVE 12/18/2015 2239     )No results found for this or any previous visit (from the past 240 hour(s)).    Anti-infectives    Start     Dose/Rate Route Frequency Ordered Stop    12/19/15 1400  metroNIDAZOLE (FLAGYL) IVPB 500 mg     500 mg 100 mL/hr over 60 Minutes Intravenous Every 8 hours 12/19/15 1257     12/19/15 1400  ciprofloxacin (CIPRO) IVPB 400 mg     400 mg 200 mL/hr over 60 Minutes Intravenous Every 24 hours 12/19/15 1307         Radiology Studies: Dg Abd Portable 1v-small Bowel Obstruction Protocol-initial, 8 Hr Delay  Result Date: 12/22/2015 CLINICAL DATA:  Small-bowel obstruction . EXAM: PORTABLE ABDOMEN - 1 VIEW COMPARISON:  12/21/2015 FINDINGS: NG tube noted with tip in the stomach. Contrast noted within the stomach. Persistent unchanged distended loops of small bowel suggesting small bowel obstruction. No free air. Pelvic calcifications consistent phleboliths. Degenerative changes lumbar spine . IMPRESSION: NG tube in stable position with its tip in the stomach. Contrast is in the stomach. 2. Persistent unchanged distended loops of small bowel suggesting small bowel obstruction. Electronically Signed   By: Marcello Moores  Register   On: 12/22/2015 07:17   Dg Abd Portable 1v-small Bowel Protocol-position Verification  Result Date: 12/21/2015 CLINICAL DATA:  NG tube placement EXAM: PORTABLE ABDOMEN - 1 VIEW COMPARISON:  12/21/2015 FINDINGS: Heart size is enlarged. Esophageal tube tip overlies the mid stomach. Persistent small bowel gaseous dilatation in the central abdomen measuring up to 4 cm. IMPRESSION: 1. Esophageal tube tip overlies the body of the stomach. 2. Persistent gaseous dilatation of small bowel in the central abdomen, possible bowel obstruction. Electronically Signed   By: Donavan Foil M.D.   On: 12/21/2015 20:18   Dg Abd Portable 1v  Result Date: 12/21/2015 CLINICAL DATA:  Nausea vomiting.  Constipation. EXAM: PORTABLE ABDOMEN - 1 VIEW COMPARISON:  CT 12/18/2015. FINDINGS: Soft tissue structures are unremarkable. Mild gastric distention. Prominent dilated loops of proximal small bowel are noted. Small-bowel obstruction cannot be excluded.  Abdominal and pelvic calcifications consistent with phleboliths. Degenerative changes lumbar spine and both hips. IMPRESSION: Mild gastric distention. Prominent dilated loops of proximal small bowel noted. Small-bowel obstruction cannot be excluded. Electronically Signed   By: [place]   On: 12/21/2015 11:32        Scheduled Meds: . chlorhexidine  15 mL Mouth Rinse BID  . ciprofloxacin  400 mg Intravenous Q24H  . digoxin  0.0625 mg Intravenous Q M,W,F  . insulin aspart  0-9 Units Subcutaneous Q4H  . isosorbide mononitrate  30 mg Oral Daily  . mouth rinse  15 mL Mouth Rinse q12n4p  . metronidazole  500 mg Intravenous Q8H  . multivitamin with minerals  1 tablet Oral q morning - 10a  . polyethylene glycol  17 g Oral Daily   Continuous Infusions: . sodium chloride    . heparin 1,000 Units/hr (12/22/15 0018)     LOS: 2 days    Time spent: 25 min    Elmarie Shiley, MD [facility] Pager [phone]  If 7PM-7AM, please contact night-coverage [email or web address] Password [email or web address] 12/22/2015, 10:31 AM

## 2015-12-22 NOTE — Progress Notes (Signed)
[place] for heparin Indication: atrial fibrillation  Allergies  Allergen Reactions  . Piroxicam Other (See Comments)    REACTION: HEADACHE   Patient Measurements: Height: 5\' 5"  (165.1 cm) Weight: 157 lb 6.4 oz (71.4 kg) IBW/kg (Calculated) : 57 Heparin Dosing Weight: 71.4 kg  Vital Signs: Temp: 97.5 F (36.4 C) (10/26 1500) Temp Source: Oral (10/26 1500) BP: 114/86 (10/26 1500) Pulse Rate: 95 (10/26 1500)  Labs:  Recent Labs  12/20/15 0424 12/21/15 0401 12/21/15 1537 12/22/15 0516 12/22/15 0806  HGB 15.1* 14.7  --   --  14.5  HCT 43.5 40.9  --   --  40.2  PLT 306 310  --   --  335  APTT  --   --  34  --  58*  HEPARINUNFRC  --   --  >2.20*  --  >2.20*  CREATININE 2.31* 2.35*  --  2.77*  --    Estimated Creatinine Clearance: 15.3 mL/min (by C-G formula based on SCr of 2.77 mg/dL (H)).  Assessment: 82 yo female with afib transitioned from apixaban to heparin while NPO for enteritis vs SB O.  Last apixaban 2.5 mg dose was on 12/21/15 at 1013 which can potentiate the heparin level.  Baseline aPTT 34 and heparin level is > 2.2  Confirmatory levels do not correlate-  aPTT 58 subtherapeutic and heparin level is > 2.2 -RN reports no issues with infusion or with bleeding   Goal of Therapy:  Heparin level 0.3-0.7 units/ml aPTT 66-102 seconds Monitor platelets by anticoagulation protocol: Yes   Plan:  - increase heparin to 1300 units/hr - 8 hr heparin level and aPTT after rate increase - daily heparin level and CBC  Georga Bora, PharmD Clinical Pharmacist Pager: [phone] 12/22/2015 7:51 PM

## 2015-12-22 NOTE — Consult Note (Signed)
Cardiology Consult    The patient has been seen in conjunction with Jettie Booze, NP. All aspects of care have been considered and discussed. The patient has been personally interviewed, examined, and all clinical data has been reviewed.   Wendy Kerr is well known to me with h/o chronic combined systolic and diastolic heart failure, chronic right heart failure and recent ascites,  DM 2, CKD 3-4, PAF/flutter, suspected but never documented CAD (intermediate risk nuclear with no ischemia and EF 33%), and hypertension who was admitted with SBO. No significant improvement over past 4 days and may require surgery.  She is cleared if surgery if needed and will be high risk for cardiac complications.  Check baseline BNP, Troponin I, and repeat ECG.  Discontinue digoxin as the admitting ECG showed an accelerated junctional rhythm which may be evidence of dig toxicity.  With progressive azotemia, we should DC diuretic therapy. She is not currently volume overloaded.  Patient ID: Wendy Kerr MRN: [id number], DOB/AGE: 1930-06-24   Admit date: 12/18/2015 Date of Consult: 12/22/2015  Primary Physician: Hoyt Koch, MD Reason for Consult: Evaluation of cardiac risk for surgery and CHF  Primary Cardiologist: Dr. Tamala Julian  Requesting Provider: Dr. Tyrell Antonio  Patient Profile    Wendy Kerr is a 82 year old female with a past medical history of CKD stage III, HTN, PAF on Eliquis, DM, OSA, chronic combined systolic and diastolic CHF (EF of [id number]), and adrenal mass. She presented to the ED on 12/18/15 with severe abdominal pain and vomiting. Seen by surgery, and with underlying CHF, cardiology was consulted.   History of Present Illness    Wendy Kerr is a 82 year old female with the above past medical history. She was recently admitted from our office from 11/18/15-11/23/15 for acute on chronic systolic CHF. She had been seen by GI 2 days prior to that admission and underwent  paracentesis. Biopsy of fluid showed no underlying liver disease it was felt the fluid was secondary to her CHF.   She was seen on 11/30/15 for hospital follow up. She was doing well, her weight was documented at 172 lbs.   She presented to the ED on 12/18/15 with abdominal pain that was first treated as enteritis, but her vomiting worsened and her X ray showed SBO. Surgery has been consulted, she is being managed now with NGT. Surgery following and will decide +/- intervention based on her clinical picture.   Her last Echo in September of this year showed an EF of 40-45% with severe concentric hypertrophy also had moderate pulmonary hypertension. Her EF in April 2016 showed an EF of 50-55% and grade 2 diastolic dysfunction.   She has never had a right or left heart cath, she has had a Lexiscan Myoview in April 2016, and there was no ischemia or scar. Stress test was done in the setting of chest pain and SOB, it was felt that her symptoms were related to her CHF.   She denies ever having any chest pain, denies SOB and orthopnea. Currently denies abdominal pain, NGT is uncomfortable for her.   Past Medical History   Past Medical History:  Diagnosis Date  . 1st degree AV block   . Anemia   . Atrial flutter ([place])   . Benign neoplasm of pancreas, except islets of Langerhans   . Bronchitis, mucopurulent recurrent ([place])   . Common migraine   . Diabetes mellitus ([place])   . Diverticulosis of colon   .  DJD (degenerative joint disease)   . Generalized anxiety disorder   . Hypercholesterolemia   . Hypertension   . Monoclonal gammopathy   . Obesity   . OSA (obstructive sleep apnea)    severe with AHI 37 events per hour  . Vitamin D deficiency     Past Surgical History:  Procedure Laterality Date  . ABDOMINAL HYSTERECTOMY  1972  . resection serous cystadenoma of pancreas  2004   at [facility].  Now (2017) with Dr Bridgett Larsson with [facility]     Allergies  Allergies  Allergen Reactions  . Piroxicam  Other (See Comments)    REACTION: HEADACHE    Inpatient Medications    . chlorhexidine  15 mL Mouth Rinse BID  . ciprofloxacin  400 mg Intravenous Q24H  . digoxin  0.0625 mg Intravenous Q M,W,F  . insulin aspart  0-9 Units Subcutaneous Q4H  . mouth rinse  15 mL Mouth Rinse q12n4p  . metronidazole  500 mg Intravenous Q8H  . multivitamin with minerals  1 tablet Oral q morning - 10a  . polyethylene glycol  17 g Oral Daily    Family History    Family History  Problem Relation Age of Onset  . Hypertension Mother   . Kidney failure Mother     dialysis  . Heart Problems Mother 51  . Hypertension Father   . Kidney failure Father     dialysis  . Heart Problems Father 14  . Kidney disease Sister   . Kidney disease Other     Social History    Social History   Social History  . Marital status: Single    Spouse name: N/A  . Number of children: 2  . Years of education: N/A   Occupational History  . 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 Smoking status: Never Smoker  . Smokeless tobacco: Never Used  . Alcohol use No  . Drug use: No  . Sexual activity: Not on file   Other Topics Concern  . 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  Ambulates independently.     Review of Systems    General:  No chills, fever, night sweats or weight changes.  Cardiovascular:  No chest pain, dyspnea on exertion, edema, orthopnea, palpitations, paroxysmal nocturnal dyspnea. Dermatological: No rash, lesions/masses Respiratory: No cough, dyspnea Urologic: No hematuria, dysuria Abdominal:   No nausea, vomiting, diarrhea, bright red blood per rectum, melena, or hematemesis Neurologic:  No visual changes, wkns, changes in mental status. All other systems reviewed and are otherwise negative except as noted above.  Physical Exam    Blood pressure 132/73, pulse 97, temperature 97.8 F (36.6  C), resp. rate 15, height 5\' 5"  (1.651 m), weight 157 lb 6.4 oz (71.4 kg), SpO2 97 %.  General: Pleasant, NAD Psych: Normal affect. Neuro: Alert and oriented X 3. Moves all extremities spontaneously. HEENT: Normal, NGT in place.   Neck: Supple without bruits or JVD. Lungs:  Resp regular and unlabored, CTA. Heart: RRR no s3, s4, or murmurs. Abdomen: Soft, protuberent, tender, BS present.  Extremities: No clubbing, cyanosis or edema. DP/PT/Radials 2+ and equal bilaterally.  Labs    Troponin (Point of Care Test) No results for input(s): TROPIPOC in the last 72 hours. No results for input(s): CKTOTAL, CKMB, TROPONINI in the last 72 hours. Lab Results  Component Value Date  WBC 9.5 12/22/2015   HGB 14.5 12/22/2015   HCT 40.2 12/22/2015   MCV 76.7 (L) 12/22/2015   PLT 335 12/22/2015    Recent Labs Lab 12/18/15 1900  12/22/15 0516  NA 132*  < > 133*  K 4.9  < > 4.0  CL 92*  < > 92*  CO2 19*  < > 25  BUN 118*  < > 106*  CREATININE 2.54*  < > 2.77*  CALCIUM 9.6  < > 9.4  PROT 7.9  --   --   BILITOT 1.0  --   --   ALKPHOS 130*  --   --   ALT 27  --   --   AST 45*  --   --   GLUCOSE 199*  < > 125*  < > = values in this interval not displayed. Lab Results  Component Value Date   CHOL 185 07/30/2012   HDL 59.70 07/30/2012   LDLCALC 102 (H) 07/30/2012   TRIG 119.0 07/30/2012   Lab Results  Component Value Date   DDIMER 3.09 (H) 06/21/2014     Radiology Studies    Ct Abdomen Pelvis Wo Contrast  Result Date: 12/18/2015 CLINICAL DATA:  82 year old female with acute abdominal pain. History of pancreatic mass. Patient feels nauseous. EXAM: CT ABDOMEN AND PELVIS WITHOUT CONTRAST TECHNIQUE: Multidetector CT imaging of the abdomen and pelvis was performed following the standard protocol without IV contrast. COMPARISON:  CT dated 11/14/2015 and ultrasound dated 11/15/2015 FINDINGS: Evaluation of this exam is limited in the absence of intravenous contrast. Lower chest: Minimal  left lung base atelectatic changes. Cardiomegaly with partially visualized small pericardial effusion. No intra-abdominal free air. Small ascites, decreased from prior study. Hepatobiliary: Irregularity of the hepatic contour may represent underlying cirrhosis. Clinical correlation is recommended. No intrahepatic biliary ductal dilatation. Mild periportal edema. The gallbladder is unremarkable. Pancreas: There is 4.5 x 5.0 cm (previously 4.8 x 5.0 cm) low attenuating mass in the body of the pancreas. There is no associated gland atrophy or dilatation of the main pancreatic duct. Spleen: Normal in size without focal abnormality. Adrenals/Urinary Tract: There is a 4.8 x 4.1 cm low attenuating lesion arising from the left adrenal gland. This lesion demonstrates macroscopic fat and may represent myelo lipoma. Other etiologies are not excluded. This is similar to the prior CT. The right adrenal gland appears unremarkable. There is mild bilateral renal atrophy. A 1.5 cm left renal interpolar hypodense lesion is not well characterized but likely represents a cyst. There is mild right hydronephrosis. A 5 mm calculus along the course of the proximal right ureter appears in stable positioning as the prior CT and likely represents vascular calcification. No definite renal or ureteral calculi identified. There is no hydronephrosis on the left. The urinary bladder is unremarkable. Stomach/Bowel: There is extensive sigmoid and colonic diverticulosis without active inflammatory changes. No evidence of bowel obstruction. Multiple nondilated fluid-filled loops of small bowel throughout the abdomen may be physiologic or represent enteritis. The appendix is not visualized with certainty. No inflammatory changes identified in the right lower quadrant. Vascular/Lymphatic: There is moderate aortoiliac atherosclerotic disease. The abdominal aorta and IVC are otherwise grossly unremarkable on this noncontrast study. No portal venous gas  identified. There is no adenopathy. Reproductive: Hysterectomy. Other: Midline vertical anterior pelvic wall incisional scar. Mild diffuse subcutaneous stranding and edema. Musculoskeletal: Multilevel degenerative changes of the spine. Multilevel disc desiccation with vacuum phenomena. No acute fracture. IMPRESSION: Nondistended fluid-filled loops of small bowel may represent enteritis.  Clinical correlation is recommended. No bowel obstruction. Colonic diverticulosis without active inflammatory changes. Hypo attenuating mass in the body of the pancreas similar to the prior CT. No gland atrophy or ductal dilatation. 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 Other etiologies are not excluded. Mild right hydronephrosis no stone identified. Correlation with urinalysis recommended to exclude UTI. Small ascites decreased from prior study. Possible cirrhosis.  Clinical correlation is recommended. Electronically Signed   By: Anner Crete M.D.   On: 12/18/2015 22:42   Dg Abd 1 View  Result Date: 12/22/2015 CLINICAL DATA:  Follow-up film.  NG tube present. EXAM: ABDOMEN - 1 VIEW COMPARISON:  December 22, 2015 4:56 a.m. FINDINGS: Air-filled dilated small bowel loops are identified slightly more prominent compared to prior exam. A NG tube is identified distal tip in the distal stomach. Degenerative joint changes of the spine are identified. IMPRESSION: NG tube is identified distal tip in the distal stomach. Small bowel obstruction, slightly worse compared to prior exam. Electronically Signed   By: Abelardo Diesel M.D.   On: 12/22/2015 14:31   Dg Abd Portable 1v-small Bowel Obstruction Protocol-initial, 8 Hr Delay  Result Date: 12/22/2015 CLINICAL DATA:  Small-bowel obstruction . EXAM: PORTABLE ABDOMEN - 1 VIEW COMPARISON:  12/21/2015 FINDINGS: NG tube noted with tip in the stomach. Contrast noted within the stomach. Persistent unchanged distended loops of small bowel suggesting small bowel  obstruction. No free air. Pelvic calcifications consistent phleboliths. Degenerative changes lumbar spine . IMPRESSION: NG tube in stable position with its tip in the stomach. Contrast is in the stomach. 2. Persistent unchanged distended loops of small bowel suggesting small bowel obstruction. Electronically Signed   By: Marcello Moores  Register   On: 12/22/2015 07:17   Dg Abd Portable 1v-small Bowel Protocol-position Verification  Result Date: 12/21/2015 CLINICAL DATA:  NG tube placement EXAM: PORTABLE ABDOMEN - 1 VIEW COMPARISON:  12/21/2015 FINDINGS: Heart size is enlarged. Esophageal tube tip overlies the mid stomach. Persistent small bowel gaseous dilatation in the central abdomen measuring up to 4 cm. IMPRESSION: 1. Esophageal tube tip overlies the body of the stomach. 2. Persistent gaseous dilatation of small bowel in the central abdomen, possible bowel obstruction. Electronically Signed   By: Donavan Foil M.D.   On: 12/21/2015 20:18   Dg Abd Portable 1v  Result Date: 12/21/2015 CLINICAL DATA:  Nausea vomiting.  Constipation. EXAM: PORTABLE ABDOMEN - 1 VIEW COMPARISON:  CT 12/18/2015. FINDINGS: Soft tissue structures are unremarkable. Mild gastric distention. Prominent dilated loops of proximal small bowel are noted. Small-bowel obstruction cannot be excluded. Abdominal and pelvic calcifications consistent with phleboliths. Degenerative changes lumbar spine and both hips. IMPRESSION: Mild gastric distention. Prominent dilated loops of proximal small bowel noted. Small-bowel obstruction cannot be excluded. Electronically Signed   By: Marcello Moores  Register   On: 12/21/2015 11:32    EKG & Cardiac Imaging    EKG: Accelerated juntional rhythm with retrograde P-waves , wide QRS which is consistent with her prior EKG's. Also with RBBB.  Echocardiogram: 11/20/15 Study Conclusions  - Left ventricle: The cavity size was normal. There was severe   concentric hypertrophy. Systolic function was mildly to    moderately reduced. The estimated ejection fraction was in the   range of 40% to 45%. Wall motion was normal; there were no   regional wall motion abnormalities. The study is not technically   sufficient to allow evaluation of LV diastolic function. Doppler   parameters are consistent with high ventricular filling pressure. - Mitral  valve: There was mild regurgitation. - Right atrium: The atrium was moderately dilated. - Tricuspid valve: There was moderate regurgitation. - Pulmonary arteries: PA peak pressure: 55 mm Hg (S). - Pericardium, extracardiac: A small, free-flowing pericardial   effusion was identified posterior to the heart. The fluid had no   internal echoes.  Impressions:  - The right ventricular systolic pressure was increased consistent   with moderate pulmonary hypertension.  Assessment & Plan    1. Evaluation of cardiac risk for surgery: Patient has a history of chronic combined systolic and diastolic CHF with reduced EF of 40-45%. She has had a fairly recent ischemic evaluation which is reassuring that she does not have underlying CAD.    The challenge will be Wendy Kerr's fluid status in the peri-operative period. She has a reduced EF and elevated filling pressures. The ability to optimize her medication regimen for CHF is limited by her chronic renal insufficiency.   2. Chronic combined systolic and diastolic CHF: Currently stable, but with her advanced CHF and other co-morbities, her threshold for volume overload is narrow.   Her discharge weight was 181 lbs on 11/25/15, only one weight documented this admission at 157 lbs, question accuracy.   Also, now with SBO and NGT in place her medications have been stopped as her output from NGT is around 832ml since 7 am today.   3. PAF: On Eliquis which is on hold for possible surgery, on heparin gtt currently.   This patients CHA2DS2-VASc Score and unadjusted Ischemic Stroke Rate (% per year) is equal to 9.7 % stroke  rate/year from a score of 6 Above score calculated as 1 point each if present [CHF, HTN, DM, Vascular=MI/PAD/Aortic Plaque, Age if 65-74, or Female], 2 points each if present [Age > 75, or Stroke/TIA/TE]  4. Acute kidney injury 5. DM type 2    Signed, Arbutus Leas, NP 12/22/2015, 3:27 PM Pager: [phone]

## 2015-12-22 NOTE — Progress Notes (Signed)
Subjective: 3 L from the NG, I flushed the something replace the filter. She is not complaining of any significant discomfort. Still mildly distended and hyperactive bowel sounds.  Objective: Vital signs in last 24 hours: Temp:  [97.8 F (36.6 C)-98.7 F (37.1 C)] 97.8 F (36.6 C) (10/26 0455) Pulse Rate:  [90-97] 97 (10/26 0455) Resp:  [15-16] 15 (10/26 0455) BP: (113-132)/(69-91) 132/73 (10/26 0455) SpO2:  [96 %-99 %] 97 % (10/26 0455) Last BM Date: 12/21/15 60 PO NG 3 liters Voided x 4 Afebrile, VSS Creatinine is up and WBC is normal  Film this AM 0450 shows contrast is still in the stomach   Intake/Output from previous day: 10/25 0701 - 10/26 0700 In: 694.8 [P.O.:60; I.V.:44.8; NG/GT:90; IV Piggyback:500] Out: 3000 [Emesis/NG output:3000] Intake/Output this shift: No intake/output data recorded.  General appearance: alert, cooperative and no distress GI: Soft, mildly distended, hyperactive bowel sounds. 3 L NG drainage recorded since NG placed last evening.  Lab Results:   Recent Labs  12/21/15 0401 12/22/15 0806  WBC 14.7* 9.5  HGB 14.7 14.5  HCT 40.9 40.2  PLT 310 335    BMET  Recent Labs  12/21/15 0401 12/22/15 0516  NA 133* 133*  K 4.1 4.0  CL 95* 92*  CO2 24 25  GLUCOSE 138* 125*  BUN 99* 106*  CREATININE 2.35* 2.77*  CALCIUM 9.6 9.4   PT/INR No results for input(s): LABPROT, INR in the last 72 hours.   Recent Labs Lab 12/18/15 1900  AST 45*  ALT 27  ALKPHOS 130*  BILITOT 1.0  PROT 7.9  ALBUMIN 4.6     Lipase     Component Value Date/Time   LIPASE 48 12/18/2015 1900     Studies/Results: Dg Abd Portable 1v-small Bowel Obstruction Protocol-initial, 8 Hr Delay  Result Date: 12/22/2015 CLINICAL DATA:  Small-bowel obstruction . EXAM: PORTABLE ABDOMEN - 1 VIEW COMPARISON:  12/21/2015 FINDINGS: NG tube noted with tip in the stomach. Contrast noted within the stomach. Persistent unchanged distended loops of small bowel  suggesting small bowel obstruction. No free air. Pelvic calcifications consistent phleboliths. Degenerative changes lumbar spine . IMPRESSION: NG tube in stable position with its tip in the stomach. Contrast is in the stomach. 2. Persistent unchanged distended loops of small bowel suggesting small bowel obstruction. Electronically Signed   By: Marcello Moores  Register   On: 12/22/2015 07:17   Dg Abd Portable 1v-small Bowel Protocol-position Verification  Result Date: 12/21/2015 CLINICAL DATA:  NG tube placement EXAM: PORTABLE ABDOMEN - 1 VIEW COMPARISON:  12/21/2015 FINDINGS: Heart size is enlarged. Esophageal tube tip overlies the mid stomach. Persistent small bowel gaseous dilatation in the central abdomen measuring up to 4 cm. IMPRESSION: 1. Esophageal tube tip overlies the body of the stomach. 2. Persistent gaseous dilatation of small bowel in the central abdomen, possible bowel obstruction. Electronically Signed   By: Donavan Foil M.D.   On: 12/21/2015 20:18   Dg Abd Portable 1v  Result Date: 12/21/2015 CLINICAL DATA:  Nausea vomiting.  Constipation. EXAM: PORTABLE ABDOMEN - 1 VIEW COMPARISON:  CT 12/18/2015. FINDINGS: Soft tissue structures are unremarkable. Mild gastric distention. Prominent dilated loops of proximal small bowel are noted. Small-bowel obstruction cannot be excluded. Abdominal and pelvic calcifications consistent with phleboliths. Degenerative changes lumbar spine and both hips. IMPRESSION: Mild gastric distention. Prominent dilated loops of proximal small bowel noted. Small-bowel obstruction cannot be excluded. Electronically Signed   By: Marcello Moores  Register   On: 12/21/2015 11:32    Medications: .  chlorhexidine  15 mL Mouth Rinse BID  . ciprofloxacin  400 mg Intravenous Q24H  . digoxin  0.0625 mg Intravenous Q M,W,F  . insulin aspart  0-9 Units Subcutaneous Q4H  . isosorbide mononitrate  30 mg Oral Daily  . mouth rinse  15 mL Mouth Rinse q12n4p  . metronidazole  500 mg Intravenous  Q8H  . multivitamin with minerals  1 tablet Oral q morning - 10a  . polyethylene glycol  17 g Oral Daily   . heparin 1,000 Units/hr (12/22/15 0018)    Assessment/Plan Abdominal pain with Enteritis versus SBO - only surgery status post partial hysterectomy 1972 History of benign neoplasm of pancreas, found with SBO 11/11/2001 History of atrial flutter; diastolic and systolic congestive heart failure(2-D echo 05/2814 with EF 50-55% and grade 2 diastolic dysfunction) Chronic kidney disease stage III - creatinine 2.35 AODM History of obstructive sleep apnea Hypertension Hypercholesterolemia FEN:  NPO ID: day 4 Cipro/Flagyl DVT: Heparin drip and SCD's  Plan:  Continue NG decompression, she will need some IV fluids.  I will defer to Medicine.  I will recheck a film at noon today, and again in AM.  Recheck BMP tomorrow. New Echo is ordered.        LOS: 2 days    Gregorey Nabor 12/22/2015 [phone]

## 2015-12-23 ENCOUNTER — Inpatient Hospital Stay (HOSPITAL_COMMUNITY): Payer: Medicare Other | Admitting: Certified Registered Nurse Anesthetist

## 2015-12-23 ENCOUNTER — Inpatient Hospital Stay (HOSPITAL_COMMUNITY): Payer: Medicare Other

## 2015-12-23 ENCOUNTER — Encounter (HOSPITAL_COMMUNITY)
Admission: EM | Disposition: A | Discharge status: Home Health | Payer: Self-pay | Source: Home / Self Care | Attending: Internal Medicine

## 2015-12-23 ENCOUNTER — Encounter (HOSPITAL_COMMUNITY): Payer: Self-pay | Admitting: Anesthesiology

## 2015-12-23 DIAGNOSIS — A09 Infectious gastroenteritis and colitis, unspecified: Secondary | ICD-10-CM

## 2015-12-23 DIAGNOSIS — R110 Nausea: Secondary | ICD-10-CM

## 2015-12-23 HISTORY — PX: LAPAROTOMY: SHX154

## 2015-12-23 LAB — BASIC METABOLIC PANEL
ANION GAP: 13 (ref 5–15)
Anion gap: 17 — ABNORMAL HIGH (ref 5–15)
BUN: 111 mg/dL — AB (ref 6–20)
BUN: 105 mg/dL — ABNORMAL HIGH (ref 6–20)
CALCIUM: 9 mg/dL (ref 8.9–10.3)
CO2: 25 mmol/L (ref 22–32)
CO2: 29 mmol/L (ref 22–32)
Calcium: 8.4 mg/dL — ABNORMAL LOW (ref 8.9–10.3)
Chloride: 93 mmol/L — ABNORMAL LOW (ref 101–111)
Chloride: 97 mmol/L — ABNORMAL LOW (ref 101–111)
Creatinine, Ser: 2.84 mg/dL — ABNORMAL HIGH (ref 0.44–1.00)
Creatinine, Ser: 2.72 mg/dL — ABNORMAL HIGH (ref 0.44–1.00)
GFR calc Af Amer: 17 mL/min — ABNORMAL LOW (ref >60)
GFR calc Af Amer: 18 mL/min — ABNORMAL LOW (ref >60)
GFR, EST NON AFRICAN AMERICAN: 14 mL/min — AB (ref >60)
GFR, EST NON AFRICAN AMERICAN: 15 mL/min — AB (ref >60)
GLUCOSE: 130 mg/dL — AB (ref 65–99)
GLUCOSE: 129 mg/dL — AB (ref 65–99)
POTASSIUM: 3.4 mmol/L — AB (ref 3.5–5.1)
Potassium: 4.2 mmol/L (ref 3.5–5.1)
Sodium: 135 mmol/L (ref 135–145)
Sodium: 139 mmol/L (ref 135–145)

## 2015-12-23 LAB — GLUCOSE, CAPILLARY
GLUCOSE-CAPILLARY: 120 mg/dL — AB (ref 65–99)
GLUCOSE-CAPILLARY: 127 mg/dL — AB (ref 65–99)
GLUCOSE-CAPILLARY: 108 mg/dL — AB (ref 65–99)
GLUCOSE-CAPILLARY: 151 mg/dL — AB (ref 65–99)
GLUCOSE-CAPILLARY: 106 mg/dL — AB (ref 65–99)
Glucose-Capillary: 115 mg/dL — ABNORMAL HIGH (ref 65–99)
Glucose-Capillary: 142 mg/dL — ABNORMAL HIGH (ref 65–99)

## 2015-12-23 LAB — CBC WITH DIFFERENTIAL/PLATELET
BASOS ABS: 0 10*3/uL (ref 0.0–0.1)
Basophils Relative: 0 %
Eosinophils Absolute: 0 10*3/uL (ref 0.0–0.7)
Eosinophils Relative: 0 %
HEMATOCRIT: 46.7 % — AB (ref 36.0–46.0)
HEMOGLOBIN: 16.6 g/dL — AB (ref 12.0–15.0)
LYMPHS PCT: 6 %
Lymphs Abs: 0.5 10*3/uL — ABNORMAL LOW (ref 0.7–4.0)
MCH: 28.3 pg (ref 26.0–34.0)
MCHC: 35.5 g/dL (ref 30.0–36.0)
MCV: 79.6 fL (ref 78.0–100.0)
Monocytes Absolute: 0.9 10*3/uL (ref 0.1–1.0)
Monocytes Relative: 10 %
NEUTROS ABS: 7.8 10*3/uL — AB (ref 1.7–7.7)
Neutrophils Relative %: 84 %
Platelets: 324 10*3/uL (ref 150–400)
RBC: 5.87 MIL/uL — AB (ref 3.87–5.11)
RDW: 14.8 % (ref 11.5–15.5)
WBC: 9.2 10*3/uL (ref 4.0–10.5)

## 2015-12-23 LAB — APTT
APTT: 87 s — AB (ref 24–36)
APTT: 122 s — AB (ref 24–36)

## 2015-12-23 LAB — CBC
HEMATOCRIT: 43.4 % (ref 36.0–46.0)
Hemoglobin: 15.5 g/dL — ABNORMAL HIGH (ref 12.0–15.0)
MCH: 28.1 pg (ref 26.0–34.0)
MCHC: 35.7 g/dL (ref 30.0–36.0)
MCV: 78.8 fL (ref 78.0–100.0)
PLATELETS: 297 10*3/uL (ref 150–400)
RBC: 5.51 MIL/uL — AB (ref 3.87–5.11)
RDW: 15.2 % (ref 11.5–15.5)
WBC: 12.7 10*3/uL — AB (ref 4.0–10.5)

## 2015-12-23 LAB — HEPARIN LEVEL (UNFRACTIONATED): Heparin Unfractionated: 2.08 IU/mL — ABNORMAL HIGH (ref 0.30–0.70)

## 2015-12-23 LAB — MRSA PCR SCREENING: MRSA BY PCR: NEGATIVE

## 2015-12-23 LAB — TROPONIN I: TROPONIN I: 1.03 ng/mL — AB (ref <0.03)

## 2015-12-23 SURGERY — LAPAROTOMY, EXPLORATORY
Anesthesia: General | Site: Abdomen

## 2015-12-23 MED ORDER — SUCCINYLCHOLINE CHLORIDE 200 MG/10ML IV SOSY
PREFILLED_SYRINGE | INTRAVENOUS | Status: AC
  Filled 2015-12-23: qty 10

## 2015-12-23 MED ORDER — SUGAMMADEX SODIUM 200 MG/2ML IV SOLN
INTRAVENOUS | Status: DC | PRN
Start: 2015-12-23 — End: 2015-12-23
  Administered 2015-12-23: 200 mg via INTRAVENOUS

## 2015-12-23 MED ORDER — DEXTROSE 5 % IV SOLN
2.0000 g | INTRAVENOUS | Status: AC
  Administered 2015-12-23: 2 g via INTRAVENOUS

## 2015-12-23 MED ORDER — PHENYLEPHRINE HCL 10 MG/ML IJ SOLN
INTRAVENOUS | Status: DC | PRN
  Administered 2015-12-23: 20 ug/min via INTRAVENOUS

## 2015-12-23 MED ORDER — HEPARIN (PORCINE) IN NACL 100-0.45 UNIT/ML-% IJ SOLN
1200.0000 [IU]/h | INTRAMUSCULAR | Status: DC
  Filled 2015-12-23: qty 250

## 2015-12-23 MED ORDER — PROPOFOL 10 MG/ML IV BOLUS
INTRAVENOUS | Status: DC | PRN
Start: 2015-12-23 — End: 2015-12-23
  Administered 2015-12-23: 100 mg via INTRAVENOUS

## 2015-12-23 MED ORDER — ONDANSETRON HCL 4 MG/2ML IJ SOLN
INTRAMUSCULAR | Status: DC | PRN
  Administered 2015-12-23: 4 mg via INTRAVENOUS

## 2015-12-23 MED ORDER — SUCCINYLCHOLINE CHLORIDE 20 MG/ML IJ SOLN
INTRAMUSCULAR | Status: DC | PRN
  Administered 2015-12-23: 90 mg via INTRAVENOUS

## 2015-12-23 MED ORDER — FENTANYL CITRATE (PF) 100 MCG/2ML IJ SOLN
25.0000 ug | INTRAMUSCULAR | Status: DC | PRN
  Administered 2015-12-23 (×2): 50 ug via INTRAVENOUS

## 2015-12-23 MED ORDER — ARTIFICIAL TEARS OP OINT
TOPICAL_OINTMENT | OPHTHALMIC | Status: AC
  Filled 2015-12-23: qty 3.5

## 2015-12-23 MED ORDER — FENTANYL CITRATE (PF) 100 MCG/2ML IJ SOLN
INTRAMUSCULAR | Status: AC
  Administered 2015-12-23: 50 ug via INTRAVENOUS
  Filled 2015-12-23: qty 2

## 2015-12-23 MED ORDER — FENTANYL CITRATE (PF) 100 MCG/2ML IJ SOLN
INTRAMUSCULAR | Status: DC | PRN
  Administered 2015-12-23 (×2): 75 ug via INTRAVENOUS
  Administered 2015-12-23 (×2): 25 ug via INTRAVENOUS

## 2015-12-23 MED ORDER — CEFOTETAN DISODIUM-DEXTROSE 2-2.08 GM-% IV SOLR
INTRAVENOUS | Status: AC
  Filled 2015-12-23: qty 50

## 2015-12-23 MED ORDER — ARTIFICIAL TEARS OP OINT
TOPICAL_OINTMENT | OPHTHALMIC | Status: DC | PRN
Start: 2015-12-23 — End: 2015-12-23
  Administered 2015-12-23: 1 via OPHTHALMIC

## 2015-12-23 MED ORDER — ONDANSETRON HCL 4 MG/2ML IJ SOLN
INTRAMUSCULAR | Status: AC
  Filled 2015-12-23: qty 4

## 2015-12-23 MED ORDER — ROCURONIUM BROMIDE 10 MG/ML (PF) SYRINGE
PREFILLED_SYRINGE | INTRAVENOUS | Status: AC
  Filled 2015-12-23: qty 30

## 2015-12-23 MED ORDER — CHLORHEXIDINE GLUCONATE CLOTH 2 % EX PADS: 6.0000 | MEDICATED_PAD | Freq: Once | CUTANEOUS | Status: DC

## 2015-12-23 MED ORDER — ROCURONIUM BROMIDE 100 MG/10ML IV SOLN
INTRAVENOUS | Status: DC | PRN
  Administered 2015-12-23: 40 mg via INTRAVENOUS

## 2015-12-23 MED ORDER — PHENYLEPHRINE 40 MCG/ML (10ML) SYRINGE FOR IV PUSH (FOR BLOOD PRESSURE SUPPORT)
PREFILLED_SYRINGE | INTRAVENOUS | Status: AC
  Filled 2015-12-23: qty 10

## 2015-12-23 MED ORDER — LIDOCAINE HCL (CARDIAC) 20 MG/ML IV SOLN
INTRAVENOUS | Status: DC | PRN
  Administered 2015-12-23: 80 mg via INTRAVENOUS
  Administered 2015-12-23: 20 mg via INTRAVENOUS

## 2015-12-23 MED ORDER — 0.9 % SODIUM CHLORIDE (POUR BTL) OPTIME
TOPICAL | Status: DC | PRN
  Administered 2015-12-23 (×2): 1000 mL

## 2015-12-23 MED ORDER — FENTANYL CITRATE (PF) 100 MCG/2ML IJ SOLN
INTRAMUSCULAR | Status: AC
  Filled 2015-12-23: qty 2

## 2015-12-23 MED ORDER — HYDROMORPHONE HCL 2 MG/ML IJ SOLN
0.5000 mg | INTRAMUSCULAR | Status: DC | PRN
  Administered 2015-12-23 – 2016-01-01 (×12): 0.5 mg via INTRAVENOUS
  Filled 2015-12-23 (×13): qty 1

## 2015-12-23 SURGICAL SUPPLY — 50 items
CANISTER SUCTION 2500CC (MISCELLANEOUS) ×3 IMPLANT
CHLORAPREP W/TINT 26ML (MISCELLANEOUS) ×3 IMPLANT
COVER SURGICAL LIGHT HANDLE (MISCELLANEOUS) ×3 IMPLANT
DRAPE LAPAROSCOPIC ABDOMINAL (DRAPES) ×3 IMPLANT
DRAPE WARM FLUID 44X44 (DRAPE) ×3 IMPLANT
DRSG OPSITE POSTOP 4X10 (GAUZE/BANDAGES/DRESSINGS) IMPLANT
DRSG OPSITE POSTOP 4X8 (GAUZE/BANDAGES/DRESSINGS) IMPLANT
DRSG PAD ABDOMINAL 8X10 ST (GAUZE/BANDAGES/DRESSINGS) ×3 IMPLANT
ELECT BLADE 6.5 EXT (BLADE) ×3 IMPLANT
ELECT CAUTERY BLADE 6.4 (BLADE) ×3 IMPLANT
ELECT REM PT RETURN 9FT ADLT (ELECTROSURGICAL) ×3
ELECTRODE REM PT RTRN 9FT ADLT (ELECTROSURGICAL) ×1 IMPLANT
GAUZE SPONGE 4X4 12PLY STRL (GAUZE/BANDAGES/DRESSINGS) ×3 IMPLANT
GLOVE BIO SURGEON STRL SZ8 (GLOVE) ×3 IMPLANT
GLOVE BIOGEL PI IND STRL 6 (GLOVE) IMPLANT
GLOVE BIOGEL PI IND STRL 6.5 (GLOVE) ×1 IMPLANT
GLOVE BIOGEL PI IND STRL 8 (GLOVE) ×2 IMPLANT
GLOVE BIOGEL PI INDICATOR 6 (GLOVE)
GLOVE BIOGEL PI INDICATOR 6.5 (GLOVE) ×2
GLOVE BIOGEL PI INDICATOR 8 (GLOVE) ×4
GLOVE ECLIPSE 7.5 STRL STRAW (GLOVE) ×3 IMPLANT
GLOVE SURG SS PI 6.0 STRL IVOR (GLOVE) ×6 IMPLANT
GOWN STRL REUS W/ TWL LRG LVL3 (GOWN DISPOSABLE) ×3 IMPLANT
GOWN STRL REUS W/ TWL XL LVL3 (GOWN DISPOSABLE) ×1 IMPLANT
GOWN STRL REUS W/TWL LRG LVL3 (GOWN DISPOSABLE) ×6
GOWN STRL REUS W/TWL XL LVL3 (GOWN DISPOSABLE) ×2
KIT BASIN OR (CUSTOM PROCEDURE TRAY) ×3 IMPLANT
KIT ROOM TURNOVER OR (KITS) ×3 IMPLANT
LIGASURE IMPACT 36 18CM CVD LR (INSTRUMENTS) ×3 IMPLANT
NS IRRIG 1000ML POUR BTL (IV SOLUTION) ×6 IMPLANT
PACK GENERAL/GYN (CUSTOM PROCEDURE TRAY) ×3 IMPLANT
PAD ARMBOARD 7.5X6 YLW CONV (MISCELLANEOUS) ×3 IMPLANT
PREP POVIDONE IODINE SPRAY 2OZ (MISCELLANEOUS) ×3 IMPLANT
RELOAD LINEAR CUT PROX 55 BLUE (ENDOMECHANICALS) ×6 IMPLANT
SEPRAFILM PROCEDURAL PACK 3X5 (MISCELLANEOUS) IMPLANT
SPECIMEN JAR LARGE (MISCELLANEOUS) IMPLANT
SPONGE LAP 18X18 X RAY DECT (DISPOSABLE) IMPLANT
STAPLER GUN LINEAR PROX 60 (STAPLE) ×3 IMPLANT
STAPLER PROXIMATE 55 BLUE (STAPLE) ×3 IMPLANT
STAPLER VISISTAT 35W (STAPLE) ×3 IMPLANT
SUCTION POOLE TIP (SUCTIONS) ×3 IMPLANT
SUT NOVA 1 T20/GS 25DT (SUTURE) IMPLANT
SUT PDS AB 1 TP1 96 (SUTURE) ×6 IMPLANT
SUT SILK 2 0 SH CR/8 (SUTURE) ×3 IMPLANT
SUT SILK 2 0 TIES 10X30 (SUTURE) ×3 IMPLANT
SUT SILK 3 0 SH CR/8 (SUTURE) ×3 IMPLANT
SUT SILK 3 0 TIES 10X30 (SUTURE) ×3 IMPLANT
TOWEL OR 17X26 10 PK STRL BLUE (TOWEL DISPOSABLE) ×3 IMPLANT
TRAY FOLEY CATH 16FRSI W/METER (SET/KITS/TRAYS/PACK) ×3 IMPLANT
YANKAUER SUCT BULB TIP NO VENT (SUCTIONS) ×3 IMPLANT

## 2015-12-23 NOTE — Transfer of Care (Signed)
Immediate Anesthesia Transfer of Care Note  Patient: Wendy Kerr  Procedure(s) Performed: Procedure(s): EXPLORATORY LAPAROTOMY FOR SMALL BOWEL OBSTRUCTION (N/A)  Patient Location: PACU  Anesthesia Type:General  Level of Consciousness: awake, oriented, sedated, patient cooperative and responds to stimulation  Airway & Oxygen Therapy: Patient Spontanous Breathing and Patient connected to face mask oxygen  Post-op Assessment: Report given to RN, Post -op Vital signs reviewed and stable, Patient moving all extremities and Patient moving all extremities X 4  Post vital signs: Reviewed and stable  Last Vitals:  Vitals:   12/23/15 0419 12/23/15 1349  BP: (!) 116/57 124/77  Pulse: 94 94  Resp: 18 16  Temp: 36.9 C 36.5 C    Last Pain:  Vitals:   12/23/15 1349  TempSrc: Oral  PainSc:          Complications: No apparent anesthesia complications

## 2015-12-23 NOTE — Anesthesia Procedure Notes (Signed)
Procedure Name: Intubation Date/Time: 12/23/2015 4:19 PM Performed by: Jacquiline Doe A Pre-anesthesia Checklist: Patient identified, Emergency Drugs available, Suction available and Patient being monitored Patient Re-evaluated:Patient Re-evaluated prior to inductionOxygen Delivery Method: Circle System Utilized and Circle system utilized Preoxygenation: Pre-oxygenation with 100% oxygen Intubation Type: IV induction, Cricoid Pressure applied and Rapid sequence Laryngoscope Size: Mac and 3 Grade View: Grade I Tube type: Oral Tube size: 7.0 mm Number of attempts: 1 Airway Equipment and Method: Stylet and Oral airway Placement Confirmation: ETT inserted through vocal cords under direct vision,  positive ETCO2 and breath sounds checked- equal and bilateral Secured at: 22 cm Tube secured with: Tape Dental Injury: Teeth and Oropharynx as per pre-operative assessment

## 2015-12-23 NOTE — Progress Notes (Signed)
PT Cancellation Note  Patient Details Name: Wendy Kerr MRN: [id number] DOB: 1930/06/12   Cancelled Treatment:    Reason Eval/Treat Not Completed: Patient declined, going to OR later this afternoon due to SBO.   Scheryl Marten PT, DPT  [phone]  12/23/2015, 2:05 PM

## 2015-12-23 NOTE — Anesthesia Preprocedure Evaluation (Addendum)
Anesthesia Evaluation  Patient identified by MRN, date of birth, ID band Patient awake    Reviewed: Allergy & Precautions, NPO status , Patient's Chart, lab work & pertinent test results  Airway Mallampati: II  TM Distance: >3 FB Neck ROM: Full    Dental  (+) Edentulous Upper   Pulmonary sleep apnea ,    breath sounds clear to auscultation       Cardiovascular hypertension, +CHF  + dysrhythmias  Rhythm:Regular Rate:Tachycardia  Chronic combined     .  systolic and diastolic dysfunction  EF [id number]    Neuro/Psych    GI/Hepatic GERD  ,Bowel obstruction   Endo/Other  diabetes  Renal/GU Renal InsufficiencyRenal disease     Musculoskeletal  (+) Arthritis ,   Abdominal (+) + obese,   Peds  Hematology  (+) anemia ,   Anesthesia Other Findings   Reproductive/Obstetrics                            Anesthesia Physical Anesthesia Plan  ASA: III and emergent  Anesthesia Plan:    Post-op Pain Management:    Induction: Intravenous  Airway Management Planned: Oral ETT  Additional Equipment:   Intra-op Plan:   Post-operative Plan: Extubation in OR and Possible Post-op intubation/ventilation  Informed Consent: I have reviewed the patients History and Physical, chart, labs and discussed the procedure including the risks, benefits and alternatives for the proposed anesthesia with the patient or authorized representative who has indicated his/her understanding and acceptance.   Dental advisory given  Plan Discussed with:   Anesthesia Plan Comments:         Anesthesia Quick Evaluation

## 2015-12-23 NOTE — Progress Notes (Signed)
[place] for heparin Indication: atrial fibrillation  Allergies  Allergen Reactions  . Piroxicam Other (See Comments)    REACTION: HEADACHE   Patient Measurements: Height: 5\' 5"  (165.1 cm) Weight: 157 lb 6.4 oz (71.4 kg) IBW/kg (Calculated) : 57 Heparin Dosing Weight: 71.4 kg  Vital Signs: Temp: 98.5 F (36.9 C) (10/27 0419) BP: 116/57 (10/27 0419) Pulse Rate: 94 (10/27 0419)  Labs:  Recent Labs  12/21/15 0401  12/21/15 1537 12/22/15 0516 12/22/15 0806 12/22/15 1855 12/23/15 0553 12/23/15 0700 12/23/15 0729  HGB 14.7  --   --   --  14.5  --  15.5*  --   --   HCT 40.9  --   --   --  40.2  --  43.4  --   --   PLT 310  --   --   --  335  --  297  --   --   APTT  --   < > 34  --  58* 65*  --   --  87*  HEPARINUNFRC  --   --  >2.20*  --  >2.20*  --   --   --  2.08*  CREATININE 2.35*  --   --  2.77*  --   --  2.84*  --   --   TROPONINI  --   --   --   --   --  0.99*  --  1.03*  --   < > = values in this interval not displayed. Estimated Creatinine Clearance: 14.9 mL/min (by C-G formula based on SCr of 2.84 mg/dL (H)).  Assessment: 82 yo female with afib transitioned from apixaban to heparin while NPO for enteritis vs SB O.  Last apixaban 2.5 mg dose was on 12/21/15 at 1013 which can potentiate the heparin level.  Baseline aPTT 34 and heparin level is > 2.2  Confirmatory levels do not correlate-  aPTT 87 therapeutic and heparin level is > 2 -RN reports no issues with infusion or with bleeding. Levels not correlating due to recent apixaban. Will dose off the aPTT for now.   Goal of Therapy:  Heparin level 0.3-0.7 units/ml aPTT 66-102 seconds Monitor platelets by anticoagulation protocol: Yes   Plan:  -Continue heparin to 1300 units/hr -Confirm with aPTT in 8 hours -Daily aPTT, heparin level, and CBC until levels are correlating.   Sloan Leiter, PharmD, BCPS Clinical Pharmacist [phone] 12/23/2015 10:41 AM

## 2015-12-23 NOTE — Progress Notes (Signed)
Patient Name: Wendy Kerr Date of Encounter: 12/23/2015  Primary Cardiologist: [facility] Problem List     Principal Problem:   Enteritis presumed infectious Active Problems:   Chronic combined systolic and diastolic heart failure (HCC)   Diabetes mellitus type 2 in obese (HCC)   Essential hypertension   OSA (obstructive sleep apnea)   CKD stage 3 due to type 2 diabetes mellitus (HCC)   AKI (acute kidney injury) (HCC)   Nausea and vomiting   Poisoning by digoxin   Preoperative cardiovascular examination   SBO (small bowel obstruction)     Subjective   Still with NG tube. No chest discomfort or dyspnea. Did not rest well. Still complaining of abdominal distention.  Inpatient Medications    Scheduled Meds: . cefoTEtan (CEFOTAN) IV  2 g Intravenous To SS-Surg  . chlorhexidine  15 mL Mouth Rinse BID  . Chlorhexidine Gluconate Cloth  6 each Topical Once   And  . Chlorhexidine Gluconate Cloth  6 each Topical Once  . ciprofloxacin  400 mg Intravenous Q24H  . insulin aspart  0-9 Units Subcutaneous Q4H  . mouth rinse  15 mL Mouth Rinse q12n4p  . metronidazole  500 mg Intravenous Q8H  . multivitamin with minerals  1 tablet Oral q morning - 10a   Continuous Infusions: . sodium chloride 125 mL/hr at 12/23/15 1249  . heparin 1,300 Units/hr (12/22/15 2348)   PRN Meds: bisacodyl, hydrALAZINE, ondansetron **OR** ondansetron (ZOFRAN) IV   Vital Signs    Vitals:   12/22/15 0455 12/22/15 1500 12/22/15 2029 12/23/15 0419  BP: 132/73 114/86 114/76 (!) 116/57  Pulse: 97 95 93 94  Resp: 15  19 18   Temp: 97.8 F (36.6 C) 97.5 F (36.4 C) 98.3 F (36.8 C) 98.5 F (36.9 C)  TempSrc:  Oral Oral   SpO2: 97% 97% 98% 96%  Weight:      Height:        Intake/Output Summary (Last 24 hours) at 12/23/15 1331 Last data filed at 12/23/15 1029  Gross per 24 hour  Intake              871 ml  Output             3100 ml  Net            -2229 ml   Filed Weights   12/19/15 0119  Weight: 157 lb 6.4 oz (71.4 kg)    Physical Exam    GEN: Elderly and frail but in no acute distress.  HEENT: Grossly normal.  Neck: Supple, no JVD, carotid bruits, or masses. Cardiac: RRR, no murmurs, rubs, or gallops. No clubbing, cyanosis, edema.  Radials/DP/PT 2+ and equal bilaterally.  Respiratory:  Respirations regular and unlabored, clear to auscultation bilaterally. GI: Distended and slightly tender abdomen. NG tube in place. MS: no deformity or atrophy. Skin: warm and dry, no rash. Neuro:  Strength and sensation are intact. Psych: Appears depressed.  Labs    CBC  Recent Labs  12/22/15 0806 12/23/15 0553  WBC 9.5 12.7*  HGB 14.5 15.5*  HCT 40.2 43.4  MCV 76.7* 78.8  PLT 335 [id number]   Basic Metabolic Panel  Recent Labs  12/22/15 0516 12/23/15 0553  NA 133* 135  K 4.0 4.2  CL 92* 93*  CO2 25 25  GLUCOSE 125* 130*  BUN 106* 111*  CREATININE 2.77* 2.84*  CALCIUM 9.4 9.0   Liver Function Tests No results for input(s): AST, ALT, ALKPHOS, BILITOT,  PROT, ALBUMIN in the last 72 hours. No results for input(s): LIPASE, AMYLASE in the last 72 hours. Cardiac Enzymes  Recent Labs  12/22/15 1855 12/23/15 0700  TROPONINI 0.99* 1.03*   BNP Invalid input(s): POCBNP D-Dimer No results for input(s): DDIMER in the last 72 hours. Hemoglobin A1C No results for input(s): HGBA1C in the last 72 hours. Fasting Lipid Panel No results for input(s): CHOL, HDL, LDLCALC, TRIG, CHOLHDL, LDLDIRECT in the last 72 hours. Thyroid Function Tests No results for input(s): TSH, T4TOTAL, T3FREE, THYROIDAB in the last 72 hours.  Invalid input(s): FREET3  Telemetry    No data - Personally Reviewed  ECG    Performed 12/22/15 reveals sinus rhythm with first-degree AV block, inferior infarct, left axis deviation, and right bundle. No change when compared to office tracing in September. - Personally Reviewed  Radiology    Dg Abd 1 View  Result Date:  12/22/2015 CLINICAL DATA:  Follow-up film.  NG tube present. EXAM: ABDOMEN - 1 VIEW COMPARISON:  December 22, 2015 4:56 a.m. FINDINGS: Air-filled dilated small bowel loops are identified slightly more prominent compared to prior exam. A NG tube is identified distal tip in the distal stomach. Degenerative joint changes of the spine are identified. IMPRESSION: NG tube is identified distal tip in the distal stomach. Small bowel obstruction, slightly worse compared to prior exam. Electronically Signed   By: Abelardo Diesel M.D.   On: 12/22/2015 14:31   Dg Abd 2 Views  Result Date: 12/23/2015 CLINICAL DATA:  Abdominal pain.  Follow-up small bowel obstruction. EXAM: ABDOMEN - 2 VIEW COMPARISON:  12/22/2015 FINDINGS: There persistent dilated loops of small bowel with limited air noted in a nondistended colon. There is contrast projecting in the gastric fundus unchanged from the previous day's study. Multiple air-fluid levels are noted on the decubitus view. There is no free air. IMPRESSION: 1. Persistent high-grade small bowel obstruction. Electronically Signed   By: Lajean Manes M.D.   On: 12/23/2015 09:36   Dg Abd Portable 1v-small Bowel Obstruction Protocol-initial, 8 Hr Delay  Result Date: 12/22/2015 CLINICAL DATA:  Small-bowel obstruction . EXAM: PORTABLE ABDOMEN - 1 VIEW COMPARISON:  12/21/2015 FINDINGS: NG tube noted with tip in the stomach. Contrast noted within the stomach. Persistent unchanged distended loops of small bowel suggesting small bowel obstruction. No free air. Pelvic calcifications consistent phleboliths. Degenerative changes lumbar spine . IMPRESSION: NG tube in stable position with its tip in the stomach. Contrast is in the stomach. 2. Persistent unchanged distended loops of small bowel suggesting small bowel obstruction. Electronically Signed   By: Marcello Moores  Register   On: 12/22/2015 07:17   Dg Abd Portable 1v-small Bowel Protocol-position Verification  Result Date: 12/21/2015 CLINICAL  DATA:  NG tube placement EXAM: PORTABLE ABDOMEN - 1 VIEW COMPARISON:  12/21/2015 FINDINGS: Heart size is enlarged. Esophageal tube tip overlies the mid stomach. Persistent small bowel gaseous dilatation in the central abdomen measuring up to 4 cm. IMPRESSION: 1. Esophageal tube tip overlies the body of the stomach. 2. Persistent gaseous dilatation of small bowel in the central abdomen, possible bowel obstruction. Electronically Signed   By: Donavan Foil M.D.   On: 12/21/2015 20:18    Cardiac Studies   No new data  Patient Profile     82 year old female with chronic combined systolic and diastolic heart failure (EF 40%), chronic kidney disease stage III/IV, chronic anticoagulation with Eliquis (on hold since admission), paroxysmal atrial flutter/fibrillation, hypertension, and obstructive sleep apnea. Relatively recent hospital stay for ascites felt  to be secondary to right heart failure. Admitted now with small bowel obstruction  Assessment & Plan    1. Chronic combined systolic and diastolic heart failure, with subclinical volume overload. Will need diuresis postop. 2. Paroxysmal atrial flutter, currently in sinus rhythm on preop EKG. 3. Acute on chronic kidney disease 4. Continue small bowel obstruction. Cleared for surgery from the cardiac standpoint. We'll be an increased risk. If hypoxia dyspnea postop will need aggressive diuresis.  Signed, Sinclair Grooms, MD  12/23/2015, 1:31 PM

## 2015-12-23 NOTE — Progress Notes (Signed)
[place] for heparin Indication: atrial fibrillation  Allergies  Allergen Reactions  . Piroxicam Other (See Comments)    REACTION: HEADACHE   Patient Measurements: Height: 5\' 5"  (165.1 cm) Weight: 157 lb 6.4 oz (71.4 kg) IBW/kg (Calculated) : 57 Heparin Dosing Weight: 71.4 kg  Vital Signs: Temp: 97.7 F (36.5 C) (10/27 1349) Temp Source: Oral (10/27 1349) BP: 124/77 (10/27 1349) Pulse Rate: 94 (10/27 1349)  Labs:  Recent Labs  12/21/15 0401  12/21/15 1537 12/22/15 0516 12/22/15 0806 12/22/15 1855 12/23/15 0553 12/23/15 0700 12/23/15 0729 12/23/15 1338  HGB 14.7  --   --   --  14.5  --  15.5*  --   --   --   HCT 40.9  --   --   --  40.2  --  43.4  --   --   --   PLT 310  --   --   --  335  --  297  --   --   --   APTT  --   < > 34  --  58* 65*  --   --  87* 122*  HEPARINUNFRC  --   --  >2.20*  --  >2.20*  --   --   --  2.08*  --   CREATININE 2.35*  --   --  2.77*  --   --  2.84*  --   --   --   TROPONINI  --   --   --   --   --  0.99*  --  1.03*  --   --   < > = values in this interval not displayed. Estimated Creatinine Clearance: 14.9 mL/min (by C-G formula based on SCr of 2.84 mg/dL (H)).  Assessment: 82 yo female with afib transitioned from apixaban to heparin while NPO for enteritis vs SB O.  Last apixaban 2.5 mg dose was on 12/21/15 at 1013 which can potentiate the heparin level.  Baseline aPTT 34 and heparin level is > 2.2  Repeat aPTT prolonged at 122 - SUPRA therapeutic for goal of 66-102.   Goal of Therapy:  Heparin level 0.3-0.7 units/ml aPTT 66-102 seconds Monitor platelets by anticoagulation protocol: Yes   Plan:  Heparin now discontinue for OR.  Follow-up post-operatively. Will need decreased rate if resumed.   Sloan Leiter, PharmD, BCPS Clinical Pharmacist [phone] 12/23/2015 2:34 PM

## 2015-12-23 NOTE — Care Management Important Message (Signed)
Important Message  Patient Details  Name: Wendy Kerr MRN: [id number] Date of Birth: 10-Jun-1930   Medicare Important Message Given:  Yes    Orbie Pyo 12/23/2015, 3:41 PM

## 2015-12-23 NOTE — Progress Notes (Signed)
Subjective: No real change, she is not having allot of discomfort, still distended, no BS.  She is taking in allot of ice that is most likely not being recorded.  She reports being up in the chair but has not walked.  Objective: Vital signs in last 24 hours: Temp:  [97.5 F (36.4 C)-98.5 F (36.9 C)] 98.5 F (36.9 C) (10/27 0419) Pulse Rate:  [93-95] 94 (10/27 0419) Resp:  [18-19] 18 (10/27 0419) BP: (114-116)/(57-86) 116/57 (10/27 0419) SpO2:  [96 %-98 %] 96 % (10/27 0419) Last BM Date: 12/19/15 2700 from the NG yesterday Urine 700 recorded for 24 hours Afebrile, VSS Creatinine is up, Troponin is up and BNP yesterday 2045 Film this AM show persistent high grade obstruction, the contgrast still in gastric fundus as noted yesterday. Intake/Output from previous day: 10/26 0701 - 10/27 0700 In: 5 [P.O.:210; I.V.:661] Out: 3400 [Urine:700; Emesis/NG output:2700] Intake/Output this shift: Total I/O In: -  Out: 200 [Urine:200]  General appearance: alert, cooperative and no distress GI: Soft, no bowel sounds, she is not tender. She remains distended, no flatus.  Lab Results:   Recent Labs  12/22/15 0806 12/23/15 0553  WBC 9.5 12.7*  HGB 14.5 15.5*  HCT 40.2 43.4  PLT 335 297    BMET  Recent Labs  12/22/15 0516 12/23/15 0553  NA 133* 135  K 4.0 4.2  CL 92* 93*  CO2 25 25  GLUCOSE 125* 130*  BUN 106* 111*  CREATININE 2.77* 2.84*  CALCIUM 9.4 9.0   PT/INR No results for input(s): LABPROT, INR in the last 72 hours.   Recent Labs Lab 12/18/15 1900  AST 45*  ALT 27  ALKPHOS 130*  BILITOT 1.0  PROT 7.9  ALBUMIN 4.6     Lipase     Component Value Date/Time   LIPASE 48 12/18/2015 1900     Studies/Results: Dg Abd 1 View  Result Date: 12/22/2015 CLINICAL DATA:  Follow-up film.  NG tube present. EXAM: ABDOMEN - 1 VIEW COMPARISON:  December 22, 2015 4:56 a.m. FINDINGS: Air-filled dilated small bowel loops are identified slightly more prominent  compared to prior exam. A NG tube is identified distal tip in the distal stomach. Degenerative joint changes of the spine are identified. IMPRESSION: NG tube is identified distal tip in the distal stomach. Small bowel obstruction, slightly worse compared to prior exam. Electronically Signed   By: Abelardo Diesel M.D.   On: 12/22/2015 14:31   Dg Abd 2 Views  Result Date: 12/23/2015 CLINICAL DATA:  Abdominal pain.  Follow-up small bowel obstruction. EXAM: ABDOMEN - 2 VIEW COMPARISON:  12/22/2015 FINDINGS: There persistent dilated loops of small bowel with limited air noted in a nondistended colon. There is contrast projecting in the gastric fundus unchanged from the previous day's study. Multiple air-fluid levels are noted on the decubitus view. There is no free air. IMPRESSION: 1. Persistent high-grade small bowel obstruction. Electronically Signed   By: Lajean Manes M.D.   On: 12/23/2015 09:36   Dg Abd Portable 1v-small Bowel Obstruction Protocol-initial, 8 Hr Delay  Result Date: 12/22/2015 CLINICAL DATA:  Small-bowel obstruction . EXAM: PORTABLE ABDOMEN - 1 VIEW COMPARISON:  12/21/2015 FINDINGS: NG tube noted with tip in the stomach. Contrast noted within the stomach. Persistent unchanged distended loops of small bowel suggesting small bowel obstruction. No free air. Pelvic calcifications consistent phleboliths. Degenerative changes lumbar spine . IMPRESSION: NG tube in stable position with its tip in the stomach. Contrast is in the stomach. 2. Persistent unchanged  distended loops of small bowel suggesting small bowel obstruction. Electronically Signed   By: Marcello Moores  Register   On: 12/22/2015 07:17   Dg Abd Portable 1v-small Bowel Protocol-position Verification  Result Date: 12/21/2015 CLINICAL DATA:  NG tube placement EXAM: PORTABLE ABDOMEN - 1 VIEW COMPARISON:  12/21/2015 FINDINGS: Heart size is enlarged. Esophageal tube tip overlies the mid stomach. Persistent small bowel gaseous dilatation in the  central abdomen measuring up to 4 cm. IMPRESSION: 1. Esophageal tube tip overlies the body of the stomach. 2. Persistent gaseous dilatation of small bowel in the central abdomen, possible bowel obstruction. Electronically Signed   By: Donavan Foil M.D.   On: 12/21/2015 20:18   Dg Abd Portable 1v  Result Date: 12/21/2015 CLINICAL DATA:  Nausea vomiting.  Constipation. EXAM: PORTABLE ABDOMEN - 1 VIEW COMPARISON:  CT 12/18/2015. FINDINGS: Soft tissue structures are unremarkable. Mild gastric distention. Prominent dilated loops of proximal small bowel are noted. Small-bowel obstruction cannot be excluded. Abdominal and pelvic calcifications consistent with phleboliths. Degenerative changes lumbar spine and both hips. IMPRESSION: Mild gastric distention. Prominent dilated loops of proximal small bowel noted. Small-bowel obstruction cannot be excluded. Electronically Signed   By: [place]   On: 12/21/2015 11:32    Medications: . chlorhexidine  15 mL Mouth Rinse BID  . ciprofloxacin  400 mg Intravenous Q24H  . insulin aspart  0-9 Units Subcutaneous Q4H  . mouth rinse  15 mL Mouth Rinse q12n4p  . metronidazole  500 mg Intravenous Q8H  . multivitamin with minerals  1 tablet Oral q morning - 10a  . polyethylene glycol  17 g Oral Daily   . sodium chloride 50 mL/hr at 12/22/15 1638  . 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 Her WBC  is up some again. Creatinine is continuing to trend upward. She has mild troponin elevations and elevated BMP. Daily weights ordered but I don't see a result. If she does not improve soon she may need surgical intervention.  Will review with Dr. Hulen Skains.  I ask her to cut back on the ice.  LOS: 3 days    Judi Jaffe 12/23/2015 [phone]

## 2015-12-23 NOTE — Progress Notes (Signed)
[place] for heparin Indication: atrial fibrillation  Allergies  Allergen Reactions  . Piroxicam Other (See Comments)    REACTION: HEADACHE   Patient Measurements: Height: 5\' 5"  (165.1 cm) Weight: 157 lb 6.4 oz (71.4 kg) IBW/kg (Calculated) : 57 Heparin Dosing Weight: 71.4 kg  Vital Signs: Temp: 97 F (36.1 C) (10/27 1755) Temp Source: Oral (10/27 1349) BP: 130/89 (10/27 1857) Pulse Rate: 91 (10/27 1857)  Labs:  Recent Labs  12/21/15 0401  12/21/15 1537 12/22/15 0516 12/22/15 0806 12/22/15 1855 12/23/15 0553 12/23/15 0700 12/23/15 0729 12/23/15 1338  HGB 14.7  --   --   --  14.5  --  15.5*  --   --   --   HCT 40.9  --   --   --  40.2  --  43.4  --   --   --   PLT 310  --   --   --  335  --  297  --   --   --   APTT  --   < > 34  --  58* 65*  --   --  87* 122*  HEPARINUNFRC  --   --  >2.20*  --  >2.20*  --   --   --  2.08*  --   CREATININE 2.35*  --   --  2.77*  --   --  2.84*  --   --   --   TROPONINI  --   --   --   --   --  0.99*  --  1.03*  --   --   < > = values in this interval not displayed. Estimated Creatinine Clearance: 14.9 mL/min (by C-G formula based on SCr of 2.84 mg/dL (H)).  Assessment: 82 yo female with afib transitioned from apixaban to heparin while NPO for enteritis vs SBO.  Last apixaban 2.5 mg dose was on 12/21/15 at 1013 which can potentiate the heparin level.  Baseline aPTT 34 and heparin level is > 2.2  Now s/p ex lap for SBO, pharmacy to resume heparin drip 24 hrs post-op without a bolus. Last aPTT was supratherapeutic so will resume at a lower dose. AET 17:58.  Goal of Therapy:  Heparin level 0.3-0.7 units/ml aPTT 66-102 seconds Monitor platelets by anticoagulation protocol: Yes   Plan:  Resume heparin drip at 1200 units/hr on 10/28 at 18:00 Daily heparin level and CBC Monitor for s/sx of bleeding Follow-up correlation of heparin level and aPTT   Renold Genta, PharmD, BCPS Clinical  Pharmacist Phone for tonight - [place] - [phone] 12/23/2015 8:01 PM

## 2015-12-23 NOTE — Op Note (Signed)
OPERATIVE REPORT  DATE OF OPERATION:  12/23/2015  PATIENT:  Wendy Kerr  82 y.o. female  PRE-OPERATIVE DIAGNOSIS:  SMALL BOWEL OBSTRUCTION  POST-OPERATIVE DIAGNOSIS:  SMALL BOWEL OBSTRUCTION  INDICATION(S) FOR OPERATION:  Persistent small bowl obstruction  FINDINGS:  Distal SBO involving the jejunum with necrosis, but no perforation, intertwined with right Fallopian tube and ovary.  Segment 1 foot long that was resected  PROCEDURE:  Procedure(s): EXPLORATORY LAPAROTOMY FOR SMALL BOWEL OBSTRUCTION  SURGEON:  Surgeon(s): Judeth Horn, MD Georganna Skeans, MD  ASSISTANT: Grandville Silos  ANESTHESIA:   general  COMPLICATIONS:  None  EBL: 100 ml  BLOOD ADMINISTERED: none  DRAINS: Nasogastric Tube and Urinary Catheter (Foley)   SPECIMEN:  Source of Specimen:  resected jejunum  COUNTS CORRECT:  YES  PROCEDURE DETAILS: The patient was taken to the operating room and placed on the table in the supine position. After an adequate general endotracheal anesthetic was administered, she was prepped and draped in usual sterile manner exposing her entire abdomen.  A proper timeout was performed identifying the patient and procedure to be performed. The patient had a lower infraumbilical midline incision therefore we started our incision above the umbilicus down into the subcutaneous tissue we saw dissected down to the midline fascia were we able to get into the peritoneal cavity and subsequently open it down inferiorly down towards the pubic crest.  There werer minimal adhesions of small bowel to the anterior abdominal wall but there was omentum attached which had to be cauterized and detached. As was done we able to enter the free peritoneal cavity where there were dilated proximal loops of small bowel going down into an intrapelvic not of small bowel that subsequently was found to be ischemic mid to distal jejunum caught in scar tissue associated with for previous hysterectomy.  There was also  bloody ascites upon entering.  The fallopian tube from the left side was attached to this loop of small bowel and obstructed structure that had to be freed away in order to mobilize the small bowel into the wound and interpreted what areas need to be resected. Those a 1 foot segment of partially necrotic and frankly ischemic small bowel that was resected using GIA 55 blue cartridge staplers and subsequently anastomosed using the same stapler. A TX 60 stapler was used to make the anastomosis. This small bowel mesentery was reapproximated using interrupted 2-0 silk sutures. Once this was done we ran the small bowel several times from the ligament of Treitz down to the terminal ileum finding there to be no other areas of obstruction or ischemia.  Upon palpating the stomach for NG tube placement there was a hard mass associated with the structure identified is a large pancreatic mass which is been seen previously on multiple CT scans. No intervention was necessary for this mass as it was not obstructing the stomach.  The NG tube was improperly positioned and we milked some of the obstructed small bowel contents back through ligament of Treitz into the stomach and over 600 mL of this was removed. We irrigated with a liter or more of saline solution throughout all 4 quadrants. Have been some bloody ascites in the peritoneal cavity up once we opened initially and this is likely secondary to the infarcted bowel. Once we irrigated with saline we closed the fascia using running looped #1 PDS suture. The skin was closed using stainless steel staples. All needle counts, sponge counts, and instrument counts were correct.  PATIENT DISPOSITION:  PACU -  hemodynamically stable.   [name] 10/27/20175:42 PM

## 2015-12-23 NOTE — Progress Notes (Signed)
PROGRESS NOTE    Wendy Kerr  [id number] DOB: 11-Jun-1930 DOA: 12/18/2015 PCP: Hoyt Koch, MD   Outpatient Specialists:     Brief Narrative:  Wendy Kerr is a 82 y.o. female with medical history significant of CHF with EF 40-45%, CKD.  Patient presents to the ED with c/o abdominal pain, N/V.  Abdominal pain is throughout the abdomen and especially in lower abdomen.  Symptoms are severe.  No fever, chills, diarrhea, dysuria.  No blood in vomit or BM.  50lb weight loss over last couple of months.  Recent admit and treatment for CHF.  she ate fried chicken from a gas station and symptoms onset shortly there after.  Nothing makes symptoms better or worse.  CT abd pelvis suggestive of enteritis, no contrast used due to kidney function, lactate trending down and is now WNL on recheck, pain control achieved with IV dilaudid.  Creatinine is 2.5 (essentially unchanged from recent labs).   Assessment & Plan:   Principal Problem:   Enteritis presumed infectious Active Problems:   Diabetes mellitus type 2 in obese [facility])   Essential hypertension   Chronic combined systolic and diastolic heart failure (HCC)   OSA (obstructive sleep apnea)   CKD stage 3 due to type 2 diabetes mellitus (HCC)   AKI (acute kidney injury) (HCC)   Nausea and vomiting   Poisoning by digoxin   Preoperative cardiovascular examination   SBO (small bowel obstruction)  1- SBO;  Initially patient was thought to have enteritis. She continue to vomit. Repeat KUB with dilation Small bowel.  Surgery consulted. Patient had NG tube placed yielding 3 L of fluid from stomach.  NPO.  low rate IV fluids. Increase to 75 cc/hr.  On IV cipro and flagyl.  No improvement, might required surgery.   DM2 - appears to be diet controlled -SSI  HTN - PRN hydralazine.   Cronic diastolic CHF - -Hold imdur, unable to tolerates oral.  -Hold IV lasix . Cr increasing.  -Continue to monitor on telemetry.  -Unable to  to take oral Cardizem.  -Cardiology consulted, pre op evaluation and management.    AKI on CKD - follow BMP Hold lasix.  Out put form NG tube 2.7 L. She is negative 4 l.  Will increase IV fluids to 75 cc, monitor for pulmonary edema.   A fib; was on eliquis.  On IV heparin in case patient required  of surgical procedure.  Monitor on telemetry.  If hr increases might need to be on Cardizem gtt   DVT prophylaxis:  SCD's  Code Status: Full Code   Family Communication: Patient   Disposition Plan:  To be determine, await resolution of SBO. Might required surgery during this admission.    Consultants:   Surgery  Cardiology    Subjective: Still with abdominal discomfort, distension, 2.7 l from NG tube.  She is not feeling better/  She is alert and oriented.   Objective: Vitals:   12/22/15 0455 12/22/15 1500 12/22/15 2029 12/23/15 0419  BP: 132/73 114/86 114/76 (!) 116/57  Pulse: 97 95 93 94  Resp: 15  19 18   Temp: 97.8 F (36.6 C) 97.5 F (36.4 C) 98.3 F (36.8 C) 98.5 F (36.9 C)  TempSrc:  Oral Oral   SpO2: 97% 97% 98% 96%  Weight:      Height:        Intake/Output Summary (Last 24 hours) at 12/23/15 1042 Last data filed at 12/23/15 1029  Gross per 24 hour  Intake              871 ml  Output             3600 ml  Net            -2729 ml   Filed Weights   12/19/15 0119  Weight: 71.4 kg (157 lb 6.4 oz)    Examination:  General exam: NAD, NGT in place Respiratory system: Clear to auscultation. Respiratory effort normal. Cardiovascular system: S1 & S2 heard, RRR. No JVD, murmurs, rubs, gallops or clicks. No pedal edema. Gastrointestinal system: Abdomen is   distended, soft and mild tender. No organomegaly or masses felt.  Decreased bowel sounds heard,  Central nervous system: alert and oriented    Data Reviewed: I have personally reviewed following labs and imaging studies  CBC:  Recent Labs Lab 12/19/15 0339 12/20/15 0424 12/21/15 0401  12/22/15 0806 12/23/15 0553  WBC 11.0* 19.4* 14.7* 9.5 12.7*  HGB 15.4* 15.1* 14.7 14.5 15.5*  HCT 43.3 43.5 40.9 40.2 43.4  MCV 77.9* 79.5 77.0* 76.7* 78.8  PLT 305 306 310 335 [id number]   Basic Metabolic Panel:  Recent Labs Lab 12/19/15 0339 12/20/15 0424 12/21/15 0401 12/22/15 0516 12/23/15 0553  NA 136 134* 133* 133* 135  K 3.4* 4.0 4.1 4.0 4.2  CL 96* 94* 95* 92* 93*  CO2 24 24 24 25 25   GLUCOSE 228* 160* 138* 125* 130*  BUN 112* 108* 99* 106* 111*  CREATININE 2.28* 2.31* 2.35* 2.77* 2.84*  CALCIUM 9.5 9.3 9.6 9.4 9.0   GFR: Estimated Creatinine Clearance: 14.9 mL/min (by C-G formula based on SCr of 2.84 mg/dL (H)). Liver Function Tests:  Recent Labs Lab 12/18/15 1900  AST 45*  ALT 27  ALKPHOS 130*  BILITOT 1.0  PROT 7.9  ALBUMIN 4.6    Recent Labs Lab 12/18/15 1900  LIPASE 48   No results for input(s): AMMONIA in the last 168 hours. Coagulation Profile: No results for input(s): INR, PROTIME in the last 168 hours. Cardiac Enzymes:  Recent Labs Lab 12/22/15 1855 12/23/15 0700  TROPONINI 0.99* 1.03*   BNP (last 3 results)  Recent Labs  11/09/15 1455  PROBNP 1,559.0*   HbA1C: No results for input(s): HGBA1C in the last 72 hours. CBG:  Recent Labs Lab 12/22/15 1540 12/22/15 2029 12/23/15 0006 12/23/15 0426 12/23/15 0750  GLUCAP 127* 116* 115* 120* 127*   Lipid Profile: No results for input(s): CHOL, HDL, LDLCALC, TRIG, CHOLHDL, LDLDIRECT in the last 72 hours. Thyroid Function Tests: No results for input(s): TSH, T4TOTAL, FREET4, T3FREE, THYROIDAB in the last 72 hours. Anemia Panel: No results for input(s): VITAMINB12, FOLATE, FERRITIN, TIBC, IRON, RETICCTPCT in the last 72 hours. Urine analysis:    Component Value Date/Time   COLORURINE YELLOW 12/18/2015 2239   APPEARANCEUR CLEAR 12/18/2015 2239   LABSPEC 1.011 12/18/2015 2239   PHURINE 5.5 12/18/2015 2239   GLUCOSEU NEGATIVE 12/18/2015 2239   GLUCOSEU Color Interference (A)  08/15/2015 1245   HGBUR SMALL (A) 12/18/2015 2239   BILIRUBINUR NEGATIVE 12/18/2015 2239   KETONESUR NEGATIVE 12/18/2015 2239   PROTEINUR 100 (A) 12/18/2015 2239   UROBILINOGEN Color Interference (A) 08/15/2015 1245   NITRITE NEGATIVE 12/18/2015 2239   LEUKOCYTESUR NEGATIVE 12/18/2015 2239     )No results found for this or any previous visit (from the past 240 hour(s)).    Anti-infectives    Start     Dose/Rate Route Frequency Ordered Stop   12/19/15 1400  metroNIDAZOLE (  FLAGYL) IVPB 500 mg     500 mg 100 mL/hr over 60 Minutes Intravenous Every 8 hours 12/19/15 1257     12/19/15 1400  ciprofloxacin (CIPRO) IVPB 400 mg     400 mg 200 mL/hr over 60 Minutes Intravenous Every 24 hours 12/19/15 1307         Radiology Studies: Dg Abd 1 View  Result Date: 12/22/2015 CLINICAL DATA:  Follow-up film.  NG tube present. EXAM: ABDOMEN - 1 VIEW COMPARISON:  December 22, 2015 4:56 a.m. FINDINGS: Air-filled dilated small bowel loops are identified slightly more prominent compared to prior exam. A NG tube is identified distal tip in the distal stomach. Degenerative joint changes of the spine are identified. IMPRESSION: NG tube is identified distal tip in the distal stomach. Small bowel obstruction, slightly worse compared to prior exam. Electronically Signed   By: Abelardo Diesel M.D.   On: 12/22/2015 14:31   Dg Abd 2 Views  Result Date: 12/23/2015 CLINICAL DATA:  Abdominal pain.  Follow-up small bowel obstruction. EXAM: ABDOMEN - 2 VIEW COMPARISON:  12/22/2015 FINDINGS: There persistent dilated loops of small bowel with limited air noted in a nondistended colon. There is contrast projecting in the gastric fundus unchanged from the previous day's study. Multiple air-fluid levels are noted on the decubitus view. There is no free air. IMPRESSION: 1. Persistent high-grade small bowel obstruction. Electronically Signed   By: Lajean Manes M.D.   On: 12/23/2015 09:36   Dg Abd Portable 1v-small Bowel  Obstruction Protocol-initial, 8 Hr Delay  Result Date: 12/22/2015 CLINICAL DATA:  Small-bowel obstruction . EXAM: PORTABLE ABDOMEN - 1 VIEW COMPARISON:  12/21/2015 FINDINGS: NG tube noted with tip in the stomach. Contrast noted within the stomach. Persistent unchanged distended loops of small bowel suggesting small bowel obstruction. No free air. Pelvic calcifications consistent phleboliths. Degenerative changes lumbar spine . IMPRESSION: NG tube in stable position with its tip in the stomach. Contrast is in the stomach. 2. Persistent unchanged distended loops of small bowel suggesting small bowel obstruction. Electronically Signed   By: Marcello Moores  Register   On: 12/22/2015 07:17   Dg Abd Portable 1v-small Bowel Protocol-position Verification  Result Date: 12/21/2015 CLINICAL DATA:  NG tube placement EXAM: PORTABLE ABDOMEN - 1 VIEW COMPARISON:  12/21/2015 FINDINGS: Heart size is enlarged. Esophageal tube tip overlies the mid stomach. Persistent small bowel gaseous dilatation in the central abdomen measuring up to 4 cm. IMPRESSION: 1. Esophageal tube tip overlies the body of the stomach. 2. Persistent gaseous dilatation of small bowel in the central abdomen, possible bowel obstruction. Electronically Signed   By: Donavan Foil M.D.   On: 12/21/2015 20:18   Dg Abd Portable 1v  Result Date: 12/21/2015 CLINICAL DATA:  Nausea vomiting.  Constipation. EXAM: PORTABLE ABDOMEN - 1 VIEW COMPARISON:  CT 12/18/2015. FINDINGS: Soft tissue structures are unremarkable. Mild gastric distention. Prominent dilated loops of proximal small bowel are noted. Small-bowel obstruction cannot be excluded. Abdominal and pelvic calcifications consistent with phleboliths. Degenerative changes lumbar spine and both hips. IMPRESSION: Mild gastric distention. Prominent dilated loops of proximal small bowel noted. Small-bowel obstruction cannot be excluded. Electronically Signed   By: [place]   On: 12/21/2015 11:32         Scheduled Meds: . chlorhexidine  15 mL Mouth Rinse BID  . ciprofloxacin  400 mg Intravenous Q24H  . insulin aspart  0-9 Units Subcutaneous Q4H  . mouth rinse  15 mL Mouth Rinse q12n4p  . metronidazole  500 mg Intravenous  Q8H  . multivitamin with minerals  1 tablet Oral q morning - 10a   Continuous Infusions: . sodium chloride 50 mL/hr at 12/22/15 1638  . heparin 1,300 Units/hr (12/22/15 2348)     LOS: 3 days    Time spent: 25 min    Elmarie Shiley, MD [facility] Pager [phone]  If 7PM-7AM, please contact night-coverage [email or web address] Password [email or web address] 12/23/2015, 10:42 AM

## 2015-12-24 DIAGNOSIS — I10 Essential (primary) hypertension: Secondary | ICD-10-CM

## 2015-12-24 LAB — CBC WITH DIFFERENTIAL/PLATELET
BAND NEUTROPHILS: 10 %
BASOS ABS: 0 10*3/uL (ref 0.0–0.1)
BASOS PCT: 0 %
Blasts: 0 %
EOS ABS: 0 10*3/uL (ref 0.0–0.7)
EOS PCT: 0 %
HEMATOCRIT: 45.6 % (ref 36.0–46.0)
Hemoglobin: 16.1 g/dL — ABNORMAL HIGH (ref 12.0–15.0)
LYMPHS ABS: 0.7 10*3/uL (ref 0.7–4.0)
Lymphocytes Relative: 6 %
MCH: 27.7 pg (ref 26.0–34.0)
MCHC: 35.3 g/dL (ref 30.0–36.0)
MCV: 78.4 fL (ref 78.0–100.0)
METAMYELOCYTES PCT: 0 %
MONO ABS: 0.3 10*3/uL (ref 0.1–1.0)
MONOS PCT: 3 %
MYELOCYTES: 0 %
NEUTROS ABS: 10.6 10*3/uL — AB (ref 1.7–7.7)
Neutrophils Relative %: 81 %
Other: 0 %
PLATELETS: 348 10*3/uL (ref 150–400)
Promyelocytes Absolute: 0 %
RBC: 5.82 MIL/uL — AB (ref 3.87–5.11)
RDW: 15 % (ref 11.5–15.5)
WBC: 11.6 10*3/uL — ABNORMAL HIGH (ref 4.0–10.5)
nRBC: 0 /100 WBC

## 2015-12-24 LAB — HEPARIN LEVEL (UNFRACTIONATED): HEPARIN UNFRACTIONATED: 1.1 [IU]/mL — AB (ref 0.30–0.70)

## 2015-12-24 LAB — GLUCOSE, CAPILLARY
GLUCOSE-CAPILLARY: 132 mg/dL — AB (ref 65–99)
GLUCOSE-CAPILLARY: 133 mg/dL — AB (ref 65–99)
GLUCOSE-CAPILLARY: 144 mg/dL — AB (ref 65–99)
Glucose-Capillary: 128 mg/dL — ABNORMAL HIGH (ref 65–99)
Glucose-Capillary: 132 mg/dL — ABNORMAL HIGH (ref 65–99)
Glucose-Capillary: 138 mg/dL — ABNORMAL HIGH (ref 65–99)
Glucose-Capillary: 138 mg/dL — ABNORMAL HIGH (ref 65–99)

## 2015-12-24 LAB — BASIC METABOLIC PANEL
Anion gap: 17 — ABNORMAL HIGH (ref 5–15)
BUN: 106 mg/dL — AB (ref 6–20)
CHLORIDE: 98 mmol/L — AB (ref 101–111)
CO2: 24 mmol/L (ref 22–32)
CREATININE: 2.8 mg/dL — AB (ref 0.44–1.00)
Calcium: 8.2 mg/dL — ABNORMAL LOW (ref 8.9–10.3)
GFR calc Af Amer: 17 mL/min — ABNORMAL LOW (ref >60)
GFR calc non Af Amer: 15 mL/min — ABNORMAL LOW (ref >60)
GLUCOSE: 129 mg/dL — AB (ref 65–99)
POTASSIUM: 3.4 mmol/L — AB (ref 3.5–5.1)
SODIUM: 139 mmol/L (ref 135–145)

## 2015-12-24 LAB — APTT: APTT: 57 s — AB (ref 24–36)

## 2015-12-24 LAB — MAGNESIUM: MAGNESIUM: 2.7 mg/dL — AB (ref 1.7–2.4)

## 2015-12-24 MED ORDER — POTASSIUM CHLORIDE 10 MEQ/100ML IV SOLN
10.0000 meq | Freq: Once | INTRAVENOUS | Status: AC
  Administered 2015-12-24: 10 meq via INTRAVENOUS
  Filled 2015-12-24: qty 100

## 2015-12-24 MED ORDER — HEPARIN (PORCINE) IN NACL 100-0.45 UNIT/ML-% IJ SOLN
1100.0000 [IU]/h | INTRAMUSCULAR | Status: DC
  Administered 2015-12-24: 1100 [IU]/h via INTRAVENOUS
  Administered 2015-12-25: 1250 [IU]/h via INTRAVENOUS
  Administered 2015-12-26 – 2015-12-28 (×3): 1150 [IU]/h via INTRAVENOUS
  Administered 2015-12-29 – 2015-12-30 (×2): 1100 [IU]/h via INTRAVENOUS
  Filled 2015-12-24 (×9): qty 250

## 2015-12-24 NOTE — Progress Notes (Signed)
PROGRESS NOTE    Wendy Kerr  [id number] DOB: 02-19-1931 DOA: 12/18/2015 PCP: Hoyt Koch, MD   Outpatient Specialists:     Brief Narrative:  Wendy Kerr is a 82 y.o. female with medical history significant of CHF with EF 40-45%, CKD.  Patient presents to the ED with c/o abdominal pain, N/V.  Abdominal pain is throughout the abdomen and especially in lower abdomen.  Symptoms are severe.  No fever, chills, diarrhea, dysuria.  No blood in vomit or BM.  50lb weight loss over last couple of months.  Recent admit and treatment for CHF.  she ate fried chicken from a gas station and symptoms onset shortly there after.  Nothing makes symptoms better or worse.  CT abd pelvis suggestive of enteritis, no contrast used due to kidney function, lactate trending down and is now WNL on recheck, pain control achieved with IV dilaudid.  Creatinine is 2.5 (essentially unchanged from recent labs).   Assessment & Plan:   Principal Problem:   Enteritis presumed infectious Active Problems:   Diabetes mellitus type 2 in obese [facility])   Essential hypertension   Chronic combined systolic and diastolic heart failure (HCC)   OSA (obstructive sleep apnea)   CKD stage 3 due to type 2 diabetes mellitus (HCC)   AKI (acute kidney injury) (HCC)   Nausea   Poisoning by digoxin   Preoperative cardiovascular examination   SBO (small bowel obstruction)  1- SBO;  Initially patient was thought to have enteritis. She continue to vomit. Repeat KUB with dilation Small bowel.  Surgery consulted. Patient had NG tube placed yielding 3 L of fluid from stomach.  On IV cipro and flagyl.  Patient underwent exploratory laparotomy 10-27 found to have  Distal SBO involving the jejunum with necrosis, but no perforation, intertwined with right Fallopian tube and ovary.  Segment 1 foot long that was resected. Still with NG tube in place.  Management per sx   DM2 - appears to be diet controlled -SSI  HTN -  PRN hydralazine.   Cronic diastolic CHF - -Hold imdur, unable to tolerates oral.  -Hold IV lasix . Cr increasing.  -Continue to monitor on telemetry.  -Unable to to take oral Cardizem.  -Cardiology consulted, pre op evaluation and management.    AKI on CKD - follow BMP Hold lasix.  She is negative 6 l.  Continue with  IV fluids, monitor for pulmonary edema.   A fib; was on eliquis.  On IV heparin  Monitor on telemetry.  If hr increases might need to be on Cardizem gtt   DVT prophylaxis:  SCD's  Code Status: Full Code   Family Communication: Patient   Disposition Plan:  To be determine, await resolution of SBO. Might required surgery during this admission.    Consultants:   Surgery  Cardiology    Subjective: Doing well post sx. Mild abdominal discomfort.  NG tube in place.    Objective: Vitals:   12/23/15 2056 12/24/15 0257 12/24/15 0437 12/24/15 0515  BP: 125/80 131/79  125/75  Pulse: 92 93  91  Resp: 16 16  17   Temp: 97.9 F (36.6 C) 97.4 F (36.3 C)  97.6 F (36.4 C)  TempSrc: Oral Axillary  Axillary  SpO2: 100% 98%  99%  Weight:   71.4 kg (157 lb 4.8 oz)   Height:        Intake/Output Summary (Last 24 hours) at 12/24/15 1022 Last data filed at 12/24/15 0717  Gross per 24  hour  Intake          1796.67 ml  Output             3800 ml  Net         -2003.33 ml   Filed Weights   12/19/15 0119 12/24/15 0437  Weight: 71.4 kg (157 lb 6.4 oz) 71.4 kg (157 lb 4.8 oz)    Examination:  General exam: NAD, NGT in place Respiratory system: Clear to auscultation. Respiratory effort normal. Cardiovascular system: S1 & S2 heard, RRR. No JVD, murmurs, rubs, gallops or clicks. No pedal edema. Gastrointestinal system: Abdomen is   distended, soft and mild tender. No organomegaly or masses felt.  Decreased bowel sounds heard,  Central nervous system: alert and oriented    Data Reviewed: I have personally reviewed following labs and imaging  studies  CBC:  Recent Labs Lab 12/21/15 0401 12/22/15 0806 12/23/15 0553 12/23/15 2034 12/24/15 0612  WBC 14.7* 9.5 12.7* 9.2 11.6*  NEUTROABS  --   --   --  7.8* 10.6*  HGB 14.7 14.5 15.5* 16.6* 16.1*  HCT 40.9 40.2 43.4 46.7* 45.6  MCV 77.0* 76.7* 78.8 79.6 78.4  PLT 310 335 297 324 0000000   Basic Metabolic Panel:  Recent Labs Lab 12/21/15 0401 12/22/15 0516 12/23/15 0553 12/23/15 2034 12/24/15 0612  NA 133* 133* 135 139 139  K 4.1 4.0 4.2 3.4* 3.4*  CL 95* 92* 93* 97* 98*  CO2 24 25 25 29 24   GLUCOSE 138* 125* 130* 129* 129*  BUN 99* 106* 111* 105* 106*  CREATININE 2.35* 2.77* 2.84* 2.72* 2.80*  CALCIUM 9.6 9.4 9.0 8.4* 8.2*   GFR: Estimated Creatinine Clearance: 15.1 mL/min (by C-G formula based on SCr of 2.8 mg/dL (H)). Liver Function Tests:  Recent Labs Lab 12/18/15 1900  AST 45*  ALT 27  ALKPHOS 130*  BILITOT 1.0  PROT 7.9  ALBUMIN 4.6    Recent Labs Lab 12/18/15 1900  LIPASE 48   No results for input(s): AMMONIA in the last 168 hours. Coagulation Profile: No results for input(s): INR, PROTIME in the last 168 hours. Cardiac Enzymes:  Recent Labs Lab 12/22/15 1855 12/23/15 0700  TROPONINI 0.99* 1.03*   BNP (last 3 results)  Recent Labs  11/09/15 1455  PROBNP 1,559.0*   HbA1C: No results for input(s): HGBA1C in the last 72 hours. CBG:  Recent Labs Lab 12/23/15 1456 12/23/15 1755 12/23/15 2052 12/24/15 0043 12/24/15 0434  GLUCAP 151* 106* 142* 133* 144*   Lipid Profile: No results for input(s): CHOL, HDL, LDLCALC, TRIG, CHOLHDL, LDLDIRECT in the last 72 hours. Thyroid Function Tests: No results for input(s): TSH, T4TOTAL, FREET4, T3FREE, THYROIDAB in the last 72 hours. Anemia Panel: No results for input(s): VITAMINB12, FOLATE, FERRITIN, TIBC, IRON, RETICCTPCT in the last 72 hours. Urine analysis:    Component Value Date/Time   COLORURINE YELLOW 12/18/2015 2239   APPEARANCEUR CLEAR 12/18/2015 2239   LABSPEC 1.011  12/18/2015 2239   PHURINE 5.5 12/18/2015 2239   GLUCOSEU NEGATIVE 12/18/2015 2239   GLUCOSEU Color Interference (A) 08/15/2015 1245   HGBUR SMALL (A) 12/18/2015 2239   BILIRUBINUR NEGATIVE 12/18/2015 2239   KETONESUR NEGATIVE 12/18/2015 2239   PROTEINUR 100 (A) 12/18/2015 2239   UROBILINOGEN Color Interference (A) 08/15/2015 1245   NITRITE NEGATIVE 12/18/2015 2239   LEUKOCYTESUR NEGATIVE 12/18/2015 2239     ) Recent Results (from the past 240 hour(s))  MRSA PCR Screening     Status: None   Collection  Time: 12/23/15  2:49 PM  Result Value Ref Range Status   MRSA by PCR NEGATIVE NEGATIVE Final    Comment:        The GeneXpert MRSA Assay (FDA approved for NASAL specimens only), is one component of a comprehensive MRSA colonization surveillance program. It is not intended to diagnose MRSA infection nor to guide or monitor treatment for MRSA infections.       Anti-infectives    Start     Dose/Rate Route Frequency Ordered Stop   12/23/15 1640  cefoTEtan in Dextrose 5% (CEFOTAN) 2-2.08 GM-% IVPB    Comments:  Jones, Tomika   : cabinet override      12/23/15 1640 12/24/15 0444   12/23/15 1300  cefoTEtan (CEFOTAN) 2 g in dextrose 5 % 50 mL IVPB     2 g 100 mL/hr over 30 Minutes Intravenous To [facility] 12/23/15 1245 12/23/15 1630   12/19/15 1400  metroNIDAZOLE (FLAGYL) IVPB 500 mg  Status:  Discontinued     500 mg 100 mL/hr over 60 Minutes Intravenous Every 8 hours 12/19/15 1257 12/23/15 1924   12/19/15 1400  ciprofloxacin (CIPRO) IVPB 400 mg  Status:  Discontinued     400 mg 200 mL/hr over 60 Minutes Intravenous Every 24 hours 12/19/15 1307 12/23/15 1924       Radiology Studies: Dg Abd 1 View  Result Date: 12/22/2015 CLINICAL DATA:  Follow-up film.  NG tube present. EXAM: ABDOMEN - 1 VIEW COMPARISON:  December 22, 2015 4:56 a.m. FINDINGS: Air-filled dilated small bowel loops are identified slightly more prominent compared to prior exam. A NG tube is identified  distal tip in the distal stomach. Degenerative joint changes of the spine are identified. IMPRESSION: NG tube is identified distal tip in the distal stomach. Small bowel obstruction, slightly worse compared to prior exam. Electronically Signed   By: Abelardo Diesel M.D.   On: 12/22/2015 14:31   Dg Abd 2 Views  Result Date: 12/23/2015 CLINICAL DATA:  Abdominal pain.  Follow-up small bowel obstruction. EXAM: ABDOMEN - 2 VIEW COMPARISON:  12/22/2015 FINDINGS: There persistent dilated loops of small bowel with limited air noted in a nondistended colon. There is contrast projecting in the gastric fundus unchanged from the previous day's study. Multiple air-fluid levels are noted on the decubitus view. There is no free air. IMPRESSION: 1. Persistent high-grade small bowel obstruction. Electronically Signed   By: Lajean Manes M.D.   On: 12/23/2015 09:36        Scheduled Meds: . chlorhexidine  15 mL Mouth Rinse BID  . insulin aspart  0-9 Units Subcutaneous Q4H  . mouth rinse  15 mL Mouth Rinse q12n4p   Continuous Infusions: . sodium chloride 50 mL/hr at 12/23/15 2252  . heparin       LOS: 4 days    Time spent: 25 min    Elmarie Shiley, MD [facility] Pager [phone]  If 7PM-7AM, please contact night-coverage [email or web address] Password [email or web address] 12/24/2015, 10:22 AM

## 2015-12-24 NOTE — Progress Notes (Signed)
Wendy Kerr for heparin Indication: atrial fibrillation  Allergies  Allergen Reactions  . Piroxicam Other (See Comments)    REACTION: HEADACHE   Patient Measurements: Height: 5\' 5"  (165.1 cm) Weight: 157 lb 4.8 oz (71.4 kg) IBW/kg (Calculated) : 57 Heparin Dosing Weight: 71.4 kg  Vital Signs: Temp: 97.6 F (36.4 C) (10/28 0515) Temp Source: Axillary (10/28 0515) BP: 125/75 (10/28 0515) Pulse Rate: 91 (10/28 0515)  Labs:  Recent Labs  12/21/15 1537  12/22/15 0806 12/22/15 1855 12/23/15 0553 12/23/15 0700 12/23/15 0729 12/23/15 1338 12/23/15 2034 12/24/15 0612  HGB  --   < > 14.5  --  15.5*  --   --   --  16.6* 16.1*  HCT  --   < > 40.2  --  43.4  --   --   --  46.7* 45.6  PLT  --   < > 335  --  297  --   --   --  324 348  APTT 34  --  58* 65*  --   --  87* 122*  --   --   HEPARINUNFRC >2.20*  --  >2.20*  --   --   --  2.08*  --   --   --   CREATININE  --   < >  --   --  2.84*  --   --   --  2.72* 2.80*  TROPONINI  --   --   --  0.99*  --  1.03*  --   --   --   --   < > = values in this interval not displayed. Estimated Creatinine Clearance: 15.1 mL/min (by C-G formula based on SCr of 2.8 mg/dL (H)).  Assessment: 82 yo female with afib transitioned from apixaban to heparin while NPO for enteritis vs SBO.  Last apixaban 2.5 mg dose was on 12/21/15 at 1013 which can potentiate the heparin level.  Baseline aPTT 34 and heparin level is > 2.2  Now s/p ex lap for SBO, pharmacy to resume heparin drip post-op without a bolus- original plan was to wait 24 hours, however Dr. Dalbert Batman would like to start back drip now (AET 12/23/15 at 17:58). Last aPTT was supratherapeutic so will resume at a lower dose.   Goal of Therapy:  Heparin level 0.3-0.7 units/ml aPTT 66-102 seconds Monitor platelets by anticoagulation protocol: Yes   Plan:  Resume heparin drip at 1100 units/hr now per Dr. Dalbert Batman. APTT and heparin level in 8 hours.  Monitor for  s/sx of bleeding Follow-up correlation of heparin level and aPTT  Sloan Leiter, PharmD, BCPS Clinical Pharmacist [phone] today [phone] after 3:30PM 12/24/2015 9:18 AM

## 2015-12-24 NOTE — Progress Notes (Addendum)
[facility] for heparin Indication: atrial fibrillation  Allergies  Allergen Reactions  . Piroxicam Other (See Comments)    REACTION: HEADACHE   Patient Measurements: Height: 5\' 5"  (165.1 cm) Weight: 157 lb 4.8 oz (71.4 kg) IBW/kg (Calculated) : 57 Heparin Dosing Weight: 71.4 kg  Vital Signs: Temp: 98.3 F (36.8 C) (10/28 1414) Temp Source: Oral (10/28 1414) BP: 137/83 (10/28 1414) Pulse Rate: 93 (10/28 1414)  Labs:  Recent Labs  12/22/15 0806 12/22/15 1855 12/23/15 0553 12/23/15 0700 12/23/15 0729 12/23/15 1338 12/23/15 2034 12/24/15 0612 12/24/15 1758 12/24/15 1900  HGB 14.5  --  15.5*  --   --   --  16.6* 16.1*  --   --   HCT 40.2  --  43.4  --   --   --  46.7* 45.6  --   --   PLT 335  --  297  --   --   --  324 348  --   --   APTT 58* 65*  --   --  87* 122*  --   --   --  57*  HEPARINUNFRC >2.20*  --   --   --  2.08*  --   --   --  1.10*  --   CREATININE  --   --  2.84*  --   --   --  2.72* 2.80*  --   --   TROPONINI  --  0.99*  --  1.03*  --   --   --   --   --   --    Estimated Creatinine Clearance: 15.1 mL/min (by C-G formula based on SCr of 2.8 mg/dL (H)).  Assessment: 82 yo female with afib transitioned from apixaban to heparin while NPO for enteritis vs SBO.  Last apixaban 2.5 mg dose was on 12/21/15 at 1013 which can potentiate the heparin level.  Baseline aPTT 34 and heparin level is > 2.2  Now s/p ex lap for SBO, pharmacy resumed heparin drip post-op without a bolus- original. Initial aPTT is low. No bleeding noted.  Goal of Therapy:  Heparin level 0.3-0.7 units/ml aPTT 66-102 seconds Monitor platelets by anticoagulation protocol: Yes   Plan:  Increase heparin drip to 1250 units/hr  APTT and heparin level in 8 hours.  Monitor for s/sx of bleeding Follow-up correlation of heparin level and aPTT  Salome Arnt, PharmD, BCPS Pager # [phone] 12/24/2015 7:46 PM

## 2015-12-24 NOTE — Evaluation (Signed)
Physical Therapy Re-Evaluation Patient Details Name: Wendy Kerr MRN: [id number] DOB: [birth year] Today's Date: 12/24/2015   History of Present Illness  Wendy Fava Johnsonis a 82 y.o.femalewith medical history significant of CHF with EF 40-45%, CKD.  She was admitted due to severe abdominal pain, N/V.  Has had 50# weight loss in last few months.  Found to have enteritis.  Patient now s/p exploratory lap for SBO with segment resected on 12/23/15.    Clinical Impression  Patient now s/p exploratory lap for SBO and segment resected.  Able to ambulate 73' with RW and min assist.  Will benefit from acute PT to maximize functional independence.  Feel patient will be able to return home with family support.  Goals set 12/20/15 remain appropriate.  Continue to recommend f/u HHPT at d/c.    Follow Up Recommendations Home health PT;Supervision for mobility/OOB    Equipment Recommendations  Rolling walker with 5" wheels;3in1 (PT)    Recommendations for Other Services       Precautions / Restrictions Precautions Precautions: Fall Precaution Comments: NG tube Restrictions Weight Bearing Restrictions: No      Mobility  Bed Mobility               General bed mobility comments: Patient in chair  Transfers Overall transfer level: Needs assistance Equipment used: Rolling walker (2 wheeled) Transfers: Sit to/from Stand Sit to Stand: Min assist         General transfer comment: Cues for hand placement and to scoot to edge of chair.  Assist to power up to standing, and for balance.  Ambulation/Gait Ambulation/Gait assistance: Min assist Ambulation Distance (Feet): 96 Feet Assistive device: Rolling walker (2 wheeled) Gait Pattern/deviations: Step-through pattern;Decreased step length - right;Decreased step length - left;Decreased stride length;Trunk flexed Gait velocity: decreased Gait velocity interpretation: Below normal speed for age/gender General Gait Details: Assist to  maneuver RW during turns, and for balance.  Fatigued quickly.  Stairs            Wheelchair Mobility    Modified Rankin (Stroke Patients Only)       Balance Overall balance assessment: Needs assistance         Standing balance support: Bilateral upper extremity supported Standing balance-Leahy Scale: Poor Standing balance comment: UE support for balance due to pain with stance                             Pertinent Vitals/Pain Pain Assessment: Faces Faces Pain Scale: Hurts little more Pain Location: abdomen Pain Descriptors / Indicators: Discomfort;Sore Pain Intervention(s): Limited activity within patient's tolerance;Monitored during session;Repositioned    Home Living Family/patient expects to be discharged to:: Private residence Living Arrangements: Alone Available Help at Discharge: Family;Available PRN/intermittently (grandson/grandaughter around a lot) Type of Home: House Home Access: Stairs to enter Entrance Stairs-Rails: Right;Left;Can reach both Entrance Stairs-Number of Steps: 4 Home Layout: One level Home Equipment: Grab bars - tub/shower;Shower seat      Prior Function Level of Independence: Independent         Comments: Drives     Hand Dominance        Extremity/Trunk Assessment   Upper Extremity Assessment: Overall WFL for tasks assessed           Lower Extremity Assessment: Generalized weakness         Communication   Communication: No difficulties  Cognition Arousal/Alertness: Awake/alert Behavior During Therapy: WFL for tasks assessed/performed;Flat affect Overall Cognitive Status:  Within Functional Limits for tasks assessed                      General Comments      Exercises     Assessment/Plan    PT Assessment Patient needs continued PT services  PT Problem List Decreased strength;Decreased activity tolerance;Decreased balance;Decreased mobility;Decreased knowledge of use of DME;Pain           PT Treatment Interventions DME instruction;Gait training;Stair training;Functional mobility training;Therapeutic activities;Therapeutic exercise;Balance training;Patient/family education    PT Goals (Current goals can be found in the Care Plan section)  Acute Rehab PT Goals Patient Stated Goal: To return to independent PT Goal Formulation: With patient Time For Goal Achievement: 12/31/15 Potential to Achieve Goals: Good    Frequency Min 3X/week   Barriers to discharge Decreased caregiver support Patient lives alone    Co-evaluation               End of Session Equipment Utilized During Treatment: Gait belt Activity Tolerance: Patient limited by pain;Patient limited by fatigue Patient left: in chair;with call bell/phone within reach;with family/visitor present Nurse Communication: Mobility status         Time: 1441-1501 PT Time Calculation (min) (ACUTE ONLY): 20 min   Charges:   PT Evaluation $PT Re-evaluation: 1 Procedure     PT G Codes:        Despina Pole 01-22-2016, 8:11 PM Carita Pian. Sanjuana Kava, [place] Pager [phone]

## 2015-12-24 NOTE — Progress Notes (Signed)
Patient Name: Wendy Kerr Date of Encounter: 12/24/2015  Primary Cardiologist: [facility] Problem List     Principal Problem:   Enteritis presumed infectious Active Problems:   Diabetes mellitus type 2 in obese [facility])   Essential hypertension   Chronic combined systolic and diastolic heart failure (HCC)   OSA (obstructive sleep apnea)   CKD stage 3 due to type 2 diabetes mellitus (HCC)   AKI (acute kidney injury) ([place])   Nausea   Poisoning by digoxin   Preoperative cardiovascular examination   SBO (small bowel obstruction)     Subjective   Still with NG tube. No chest discomfort or dyspnea. Resting copfortably lying flat  Inpatient Medications    Scheduled Meds: . chlorhexidine  15 mL Mouth Rinse BID  . insulin aspart  0-9 Units Subcutaneous Q4H  . mouth rinse  15 mL Mouth Rinse q12n4p   Continuous Infusions: . sodium chloride 50 mL/hr at 12/23/15 2252  . heparin     PRN Meds: bisacodyl, hydrALAZINE, HYDROmorphone (DILAUDID) injection, ondansetron **OR** ondansetron (ZOFRAN) IV   Vital Signs    Vitals:   12/23/15 2056 12/24/15 0257 12/24/15 0437 12/24/15 0515  BP: 125/80 131/79  125/75  Pulse: 92 93  91  Resp: 16 16  17   Temp: 97.9 F (36.6 C) 97.4 F (36.3 C)  97.6 F (36.4 C)  TempSrc: Oral Axillary  Axillary  SpO2: 100% 98%  99%  Weight:   157 lb 4.8 oz (71.4 kg)   Height:        Intake/Output Summary (Last 24 hours) at 12/24/15 0852 Last data filed at 12/24/15 0717  Gross per 24 hour  Intake          1796.67 ml  Output             3800 ml  Net         -2003.33 ml   Filed Weights   12/19/15 0119 12/24/15 0437  Weight: 157 lb 6.4 oz (71.4 kg) 157 lb 4.8 oz (71.4 kg)    Physical Exam    GEN: Elderly and frail but in no acute distress.  HEENT: Grossly normal.  Neck: Supple, no JVD, carotid bruits, or masses. Cardiac: RRR, no murmurs, rubs, or gallops. No clubbing, cyanosis, edema.  Radials/DP/PT 2+ and equal bilaterally.    Respiratory:  Respirations regular and unlabored, clear to auscultation bilaterally. GI: Distended and slightly tender abdomen. NG tube in place. MS: no deformity or atrophy. Skin: warm and dry, no rash. Neuro:  Strength and sensation are intact. Psych: Appears depressed.  Labs    CBC  Recent Labs  12/23/15 2034 12/24/15 0612  WBC 9.2 11.6*  NEUTROABS 7.8* PENDING  HGB 16.6* 16.1*  HCT 46.7* 45.6  MCV 79.6 78.4  PLT 324 0000000   Basic Metabolic Panel  Recent Labs  12/23/15 2034 12/24/15 0612  NA 139 139  K 3.4* 3.4*  CL 97* 98*  CO2 29 24  GLUCOSE 129* 129*  BUN 105* 106*  CREATININE 2.72* 2.80*  CALCIUM 8.4* 8.2*   Liver Function Tests No results for input(s): AST, ALT, ALKPHOS, BILITOT, PROT, ALBUMIN in the last 72 hours. No results for input(s): LIPASE, AMYLASE in the last 72 hours. Cardiac Enzymes  Recent Labs  12/22/15 1855 12/23/15 0700  TROPONINI 0.99* 1.03*   BNP Invalid input(s): POCBNP D-Dimer No results for input(s): DDIMER in the last 72 hours. Hemoglobin A1C No results for input(s): HGBA1C in the last 72 hours.  Fasting Lipid Panel No results for input(s): CHOL, HDL, LDLCALC, TRIG, CHOLHDL, LDLDIRECT in the last 72 hours. Thyroid Function Tests No results for input(s): TSH, T4TOTAL, T3FREE, THYROIDAB in the last 72 hours.  Invalid input(s): FREET3  Telemetry    No data - Personally Reviewed  ECG    Performed 12/22/15 reveals sinus rhythm with first-degree AV block, inferior infarct, left axis deviation, and right bundle. No change when compared to office tracing in September. - Personally Reviewed  Radiology    Dg Abd 1 View  Result Date: 12/22/2015 CLINICAL DATA:  Follow-up film.  NG tube present. EXAM: ABDOMEN - 1 VIEW COMPARISON:  December 22, 2015 4:56 a.m. FINDINGS: Air-filled dilated small bowel loops are identified slightly more prominent compared to prior exam. A NG tube is identified distal tip in the distal stomach.  Degenerative joint changes of the spine are identified. IMPRESSION: NG tube is identified distal tip in the distal stomach. Small bowel obstruction, slightly worse compared to prior exam. Electronically Signed   By: Abelardo Diesel M.D.   On: 12/22/2015 14:31   Dg Abd 2 Views  Result Date: 12/23/2015 CLINICAL DATA:  Abdominal pain.  Follow-up small bowel obstruction. EXAM: ABDOMEN - 2 VIEW COMPARISON:  12/22/2015 FINDINGS: There persistent dilated loops of small bowel with limited air noted in a nondistended colon. There is contrast projecting in the gastric fundus unchanged from the previous day's study. Multiple air-fluid levels are noted on the decubitus view. There is no free air. IMPRESSION: 1. Persistent high-grade small bowel obstruction. Electronically Signed   By: Lajean Manes M.D.   On: 12/23/2015 09:36    Cardiac Studies   No new data  Patient Profile     82 year old female with chronic combined systolic and diastolic heart failure (EF 40%), chronic kidney disease stage III/IV, chronic anticoagulation with Eliquis (on hold since admission), paroxysmal atrial flutter/fibrillation, hypertension, and obstructive sleep apnea. Relatively recent hospital stay for ascites felt to be secondary to right heart failure. Admitted now with small bowel obstruction  Assessment & Plan    1. Chronic combined systolic and diastolic heart failure - does not appear volume overloaded on exam today.  Restart CHF meds once taking PO.   2. Paroxysmal atrial flutter,maintaining NSR post op.  Restarting IV heparin gtt.  Restart Cardizem/Dig when taking PO.   3. Acute on chronic kidney disease - creatinine stable 4. Continue small bowel obstruction. S/p exp lap with bowel resection Signed, Fransico Him, MD  12/24/2015, 8:52 AM

## 2015-12-24 NOTE — Anesthesia Postprocedure Evaluation (Signed)
Anesthesia Post Note  Patient: Wendy Kerr  Procedure(s) Performed: Procedure(s) (LRB): EXPLORATORY LAPAROTOMY FOR SMALL BOWEL OBSTRUCTION (N/A)  Patient location during evaluation: PACU Anesthesia Type: General Level of consciousness: awake Pain management: pain level controlled Vital Signs Assessment: post-procedure vital signs reviewed and stable Respiratory status: spontaneous breathing Cardiovascular status: stable Postop Assessment: no signs of nausea or vomiting Anesthetic complications: no    Last Vitals:  Vitals:   12/24/15 0257 12/24/15 0515  BP: 131/79 125/75  Pulse: 93 91  Resp: 16 17  Temp: 36.3 C 36.4 C    Last Pain:  Vitals:   12/24/15 0515  TempSrc: Axillary  PainSc:                  [name]

## 2015-12-24 NOTE — Progress Notes (Signed)
1 Day Post-Op  Subjective: Stable and alert Pain seems well controlled.  No respiratory complaints. Excellent urine output SPO2 99%.  Heart rate 91.  Afebrile.  BP 125/75.  Potassium 3.4.  BUN 106.  Creatinine 2.80.  Hemoglobin 16.1.  WBC 11,600.  Objective: Vital signs in last 24 hours: Temp:  [97 F (36.1 C)-97.9 F (36.6 C)] 97.6 F (36.4 C) (10/28 0515) Pulse Rate:  [83-94] 91 (10/28 0515) Resp:  [16-20] 17 (10/28 0515) BP: (89-131)/(61-89) 125/75 (10/28 0515) SpO2:  [91 %-100 %] 99 % (10/28 0515) Weight:  [71.4 kg (157 lb 4.8 oz)] 71.4 kg (157 lb 4.8 oz) (10/28 0437) Last BM Date: 12/19/15  Intake/Output from previous day: 10/27 0701 - 10/28 0700 In: 1796.7 [P.O.:60; I.V.:1706.7; NG/GT:30] Out: 3700 [Urine:1350; Emesis/NG output:1250; Blood:100] Intake/Output this shift: Total I/O In: -  Out: 300 [Emesis/NG output:300]  General appearance: Alert.  Cooperative.  Good insight considering age and recent surgery.  Does not appear to be in any major distress. Resp: clear to auscultation bilaterally GI: Soft.  Not distended.  Appropriate incisional tenderness.  Incision clean and intact.  Staples in place.  Lab Results:  Results for orders placed or performed during the hospital encounter of 12/18/15 (from the past 24 hour(s))  Glucose, capillary     Status: Abnormal   Collection Time: 12/23/15 12:04 PM  Result Value Ref Range   Glucose-Capillary 108 (H) 65 - 99 mg/dL   Comment 1 Notify RN   APTT     Status: Abnormal   Collection Time: 12/23/15  1:38 PM  Result Value Ref Range   aPTT 122 (H) 24 - 36 seconds  MRSA PCR Screening     Status: None   Collection Time: 12/23/15  2:49 PM  Result Value Ref Range   MRSA by PCR NEGATIVE NEGATIVE  Glucose, capillary     Status: Abnormal   Collection Time: 12/23/15  2:56 PM  Result Value Ref Range   Glucose-Capillary 151 (H) 65 - 99 mg/dL   Comment 1 Notify RN   Glucose, capillary     Status: Abnormal   Collection Time:  12/23/15  5:55 PM  Result Value Ref Range   Glucose-Capillary 106 (H) 65 - 99 mg/dL  CBC with Differential/Platelet     Status: Abnormal   Collection Time: 12/23/15  8:34 PM  Result Value Ref Range   WBC 9.2 4.0 - 10.5 K/uL   RBC 5.87 (H) 3.87 - 5.11 MIL/uL   Hemoglobin 16.6 (H) 12.0 - 15.0 g/dL   HCT 46.7 (H) 36.0 - 46.0 %   MCV 79.6 78.0 - 100.0 fL   MCH 28.3 26.0 - 34.0 pg   MCHC 35.5 30.0 - 36.0 g/dL   RDW 14.8 11.5 - 15.5 %   Platelets 324 150 - 400 K/uL   Neutrophils Relative % 84 %   Neutro Abs 7.8 (H) 1.7 - 7.7 K/uL   Lymphocytes Relative 6 %   Lymphs Abs 0.5 (L) 0.7 - 4.0 K/uL   Monocytes Relative 10 %   Monocytes Absolute 0.9 0.1 - 1.0 K/uL   Eosinophils Relative 0 %   Eosinophils Absolute 0.0 0.0 - 0.7 K/uL   Basophils Relative 0 %   Basophils Absolute 0.0 0.0 - 0.1 K/uL  Basic metabolic panel     Status: Abnormal   Collection Time: 12/23/15  8:34 PM  Result Value Ref Range   Sodium 139 135 - 145 mmol/L   Potassium 3.4 (L) 3.5 - 5.1 mmol/L  Chloride 97 (L) 101 - 111 mmol/L   CO2 29 22 - 32 mmol/L   Glucose, Bld 129 (H) 65 - 99 mg/dL   BUN 105 (H) 6 - 20 mg/dL   Creatinine, Ser 2.72 (H) 0.44 - 1.00 mg/dL   Calcium 8.4 (L) 8.9 - 10.3 mg/dL   GFR calc non Af Amer 15 (L) >60 mL/min   GFR calc Af Amer 18 (L) >60 mL/min   Anion gap 13 5 - 15  Glucose, capillary     Status: Abnormal   Collection Time: 12/23/15  8:52 PM  Result Value Ref Range   Glucose-Capillary 142 (H) 65 - 99 mg/dL  Glucose, capillary     Status: Abnormal   Collection Time: 12/24/15 12:43 AM  Result Value Ref Range   Glucose-Capillary 133 (H) 65 - 99 mg/dL  Glucose, capillary     Status: Abnormal   Collection Time: 12/24/15  4:34 AM  Result Value Ref Range   Glucose-Capillary 144 (H) 65 - 99 mg/dL  Basic metabolic panel     Status: Abnormal   Collection Time: 12/24/15  6:12 AM  Result Value Ref Range   Sodium 139 135 - 145 mmol/L   Potassium 3.4 (L) 3.5 - 5.1 mmol/L   Chloride 98 (L) 101  - 111 mmol/L   CO2 24 22 - 32 mmol/L   Glucose, Bld 129 (H) 65 - 99 mg/dL   BUN 106 (H) 6 - 20 mg/dL   Creatinine, Ser 2.80 (H) 0.44 - 1.00 mg/dL   Calcium 8.2 (L) 8.9 - 10.3 mg/dL   GFR calc non Af Amer 15 (L) >60 mL/min   GFR calc Af Amer 17 (L) >60 mL/min   Anion gap 17 (H) 5 - 15  CBC with Differential/Platelet     Status: Abnormal (Preliminary result)   Collection Time: 12/24/15  6:12 AM  Result Value Ref Range   WBC 11.6 (H) 4.0 - 10.5 K/uL   RBC 5.82 (H) 3.87 - 5.11 MIL/uL   Hemoglobin 16.1 (H) 12.0 - 15.0 g/dL   HCT 45.6 36.0 - 46.0 %   MCV 78.4 78.0 - 100.0 fL   MCH 27.7 26.0 - 34.0 pg   MCHC 35.3 30.0 - 36.0 g/dL   RDW 15.0 11.5 - 15.5 %   Platelets 348 150 - 400 K/uL   Neutrophils Relative % PENDING %   Neutro Abs PENDING 1.7 - 7.7 K/uL   Band Neutrophils PENDING %   Lymphocytes Relative PENDING %   Lymphs Abs PENDING 0.7 - 4.0 K/uL   Monocytes Relative PENDING %   Monocytes Absolute PENDING 0.1 - 1.0 K/uL   Eosinophils Relative PENDING %   Eosinophils Absolute PENDING 0.0 - 0.7 K/uL   Basophils Relative PENDING %   Basophils Absolute PENDING 0.0 - 0.1 K/uL   WBC Morphology PENDING    RBC Morphology PENDING    Smear Review PENDING    Other PENDING %   nRBC PENDING 0 /100 WBC   Metamyelocytes Relative PENDING %   Myelocytes PENDING %   Promyelocytes Absolute PENDING %   Blasts PENDING %     Studies/Results: Dg Abd 2 Views  Result Date: 12/23/2015 CLINICAL DATA:  Abdominal pain.  Follow-up small bowel obstruction. EXAM: ABDOMEN - 2 VIEW COMPARISON:  12/22/2015 FINDINGS: There persistent dilated loops of small bowel with limited air noted in a nondistended colon. There is contrast projecting in the gastric fundus unchanged from the previous day's study. Multiple air-fluid levels are noted  on the decubitus view. There is no free air. IMPRESSION: 1. Persistent high-grade small bowel obstruction. Electronically Signed   By: Lajean Manes M.D.   On: 12/23/2015  09:36    . chlorhexidine  15 mL Mouth Rinse BID  . insulin aspart  0-9 Units Subcutaneous Q4H  . mouth rinse  15 mL Mouth Rinse q12n4p     Assessment/Plan: s/p Procedure(s): EXPLORATORY LAPAROTOMY FOR SMALL BOWEL OBSTRUCTION  POD #1.  Small bowel resection for SBO with necrosis but no perforation. Stable Mobilize out of bed.  Incentive spirometry. Continue NG Remove Foley tomorrow  Paroxysmal  Atrial fibrillation.  Restart heparin drip now. Eliquis at home.   Chronic combined systolic and diastolic heart failure Acute on chronic kidney disease stage III DM History OSA HTN  @PROBHOSP @  LOS: 4 days    Tre Sanker M 12/24/2015  . .prob

## 2015-12-25 LAB — GLUCOSE, CAPILLARY
GLUCOSE-CAPILLARY: 118 mg/dL — AB (ref 65–99)
Glucose-Capillary: 133 mg/dL — ABNORMAL HIGH (ref 65–99)
Glucose-Capillary: 109 mg/dL — ABNORMAL HIGH (ref 65–99)
Glucose-Capillary: 121 mg/dL — ABNORMAL HIGH (ref 65–99)

## 2015-12-25 LAB — HEPARIN LEVEL (UNFRACTIONATED)
HEPARIN UNFRACTIONATED: 1.14 [IU]/mL — AB (ref 0.30–0.70)
HEPARIN UNFRACTIONATED: 1.22 [IU]/mL — AB (ref 0.30–0.70)
Heparin Unfractionated: 0.93 IU/mL — ABNORMAL HIGH (ref 0.30–0.70)

## 2015-12-25 LAB — CBC
HCT: 42.6 % (ref 36.0–46.0)
HEMOGLOBIN: 14.6 g/dL (ref 12.0–15.0)
MCH: 27.5 pg (ref 26.0–34.0)
MCHC: 34.3 g/dL (ref 30.0–36.0)
MCV: 80.2 fL (ref 78.0–100.0)
Platelets: 369 10*3/uL (ref 150–400)
RBC: 5.31 MIL/uL — AB (ref 3.87–5.11)
RDW: 15.2 % (ref 11.5–15.5)
WBC: 12.9 10*3/uL — AB (ref 4.0–10.5)

## 2015-12-25 LAB — BASIC METABOLIC PANEL
Anion gap: 14 (ref 5–15)
BUN: 106 mg/dL — AB (ref 6–20)
CALCIUM: 8.5 mg/dL — AB (ref 8.9–10.3)
CO2: 29 mmol/L (ref 22–32)
CREATININE: 2.51 mg/dL — AB (ref 0.44–1.00)
Chloride: 101 mmol/L (ref 101–111)
GFR calc non Af Amer: 17 mL/min — ABNORMAL LOW (ref >60)
GFR, EST AFRICAN AMERICAN: 19 mL/min — AB (ref >60)
Glucose, Bld: 109 mg/dL — ABNORMAL HIGH (ref 65–99)
Potassium: 3.6 mmol/L (ref 3.5–5.1)
Sodium: 144 mmol/L (ref 135–145)

## 2015-12-25 LAB — APTT
APTT: 113 s — AB (ref 24–36)
APTT: 84 s — AB (ref 24–36)
aPTT: 81 seconds — ABNORMAL HIGH (ref 24–36)

## 2015-12-25 MED ORDER — INSULIN ASPART 100 UNIT/ML ~~LOC~~ SOLN
0.0000 [IU] | Freq: Three times a day (TID) | SUBCUTANEOUS | Status: DC
  Administered 2015-12-25 – 2015-12-27 (×4): 1 [IU] via SUBCUTANEOUS
  Administered 2015-12-27: 2 [IU] via SUBCUTANEOUS
  Administered 2015-12-28: 1 [IU] via SUBCUTANEOUS
  Administered 2015-12-28: 2 [IU] via SUBCUTANEOUS
  Administered 2015-12-28 – 2016-01-01 (×13): 1 [IU] via SUBCUTANEOUS

## 2015-12-25 MED ORDER — BISACODYL 10 MG RE SUPP
10.0000 mg | Freq: Once | RECTAL | Status: AC
  Administered 2015-12-25: 10 mg via RECTAL
  Filled 2015-12-25: qty 1

## 2015-12-25 NOTE — Progress Notes (Signed)
2 Days Post-Op  Subjective:  We appreciate all the management and advice from internal medicine and cardiology.  Alert and stable.  Pleasant.  No specific complaints.    no flatus or stool yet. NG output 800 mL.  Urine output good. Potassium 3.6.  Creatinine 2.51.  BUN 106.  WBC 12,900.  Hemoglobin 14.6.    Objective: Vital signs in last 24 hours: Temp:  [98 F (36.7 C)-98.3 F (36.8 C)] 98.1 F (36.7 C) (10/29 0600) Pulse Rate:  [93-97] 94 (10/29 0600) Resp:  [16-18] 16 (10/29 0600) BP: (132-137)/(73-87) 132/87 (10/29 0600) SpO2:  [95 %-97 %] 95 % (10/29 0600) Weight:  [74.2 kg (163 lb 8 oz)] 74.2 kg (163 lb 8 oz) (10/29 0500) Last BM Date: 12/19/15  Intake/Output from previous day: 10/28 0701 - 10/29 0700 In: 1528.3 [I.V.:1428.3; IV Piggyback:100] Out: 1700 [Urine:900; Emesis/NG output:800] Intake/Output this shift: No intake/output data recorded.  General appearance: Alert.  Minimal distress.  NG with bilious drainage.  No status normal Resp: clear to auscultation bilaterally GI: Soft.  Minimally distended.  Has some bowel sounds.  Wound clean.  Minimal tenderness  Lab Results:  Results for orders placed or performed during the hospital encounter of 12/18/15 (from the past 24 hour(s))  Glucose, capillary     Status: Abnormal   Collection Time: 12/24/15 11:57 AM  Result Value Ref Range   Glucose-Capillary 128 (H) 65 - 99 mg/dL   Comment 1 Notify RN   Glucose, capillary     Status: Abnormal   Collection Time: 12/24/15  4:26 PM  Result Value Ref Range   Glucose-Capillary 132 (H) 65 - 99 mg/dL   Comment 1 Notify RN   Heparin level (unfractionated)     Status: Abnormal   Collection Time: 12/24/15  5:58 PM  Result Value Ref Range   Heparin Unfractionated 1.10 (H) 0.30 - 0.70 IU/mL  APTT     Status: Abnormal   Collection Time: 12/24/15  7:00 PM  Result Value Ref Range   aPTT 57 (H) 24 - 36 seconds  Glucose, capillary     Status: Abnormal   Collection Time: 12/24/15   7:45 PM  Result Value Ref Range   Glucose-Capillary 132 (H) 65 - 99 mg/dL  Glucose, capillary     Status: Abnormal   Collection Time: 12/24/15 11:46 PM  Result Value Ref Range   Glucose-Capillary 138 (H) 65 - 99 mg/dL  CBC     Status: Abnormal   Collection Time: 12/25/15  3:58 AM  Result Value Ref Range   WBC 12.9 (H) 4.0 - 10.5 K/uL   RBC 5.31 (H) 3.87 - 5.11 MIL/uL   Hemoglobin 14.6 12.0 - 15.0 g/dL   HCT 42.6 36.0 - 46.0 %   MCV 80.2 78.0 - 100.0 fL   MCH 27.5 26.0 - 34.0 pg   MCHC 34.3 30.0 - 36.0 g/dL   RDW 15.2 11.5 - 15.5 %   Platelets 369 150 - 400 K/uL  Basic metabolic panel     Status: Abnormal   Collection Time: 12/25/15  3:58 AM  Result Value Ref Range   Sodium 144 135 - 145 mmol/L   Potassium 3.6 3.5 - 5.1 mmol/L   Chloride 101 101 - 111 mmol/L   CO2 29 22 - 32 mmol/L   Glucose, Bld 109 (H) 65 - 99 mg/dL   BUN 106 (H) 6 - 20 mg/dL   Creatinine, Ser 2.51 (H) 0.44 - 1.00 mg/dL   Calcium 8.5 (L) 8.9 -  10.3 mg/dL   GFR calc non Af Amer 17 (L) >60 mL/min   GFR calc Af Amer 19 (L) >60 mL/min   Anion gap 14 5 - 15  APTT     Status: Abnormal   Collection Time: 12/25/15  3:58 AM  Result Value Ref Range   aPTT 81 (H) 24 - 36 seconds  Heparin level (unfractionated)     Status: Abnormal   Collection Time: 12/25/15  3:58 AM  Result Value Ref Range   Heparin Unfractionated 1.14 (H) 0.30 - 0.70 IU/mL  Glucose, capillary     Status: Abnormal   Collection Time: 12/25/15  8:31 AM  Result Value Ref Range   Glucose-Capillary 133 (H) 65 - 99 mg/dL   Comment 1 Notify RN      Studies/Results: No results found.  . chlorhexidine  15 mL Mouth Rinse BID  . insulin aspart  0-9 Units Subcutaneous TID WC & HS  . mouth rinse  15 mL Mouth Rinse q12n4p     Assessment/Plan: s/p Procedure(s): EXPLORATORY LAPAROTOMY FOR SMALL BOWEL OBSTRUCTION   POD #2.  Small bowel resection for SBO with necrosis but no perforation. Stable Mobilize out of bed.  Incentive  spirometry. Continue NG Remove Foley  PT  Paroxysmal  Atrial fibrillation.  Restart heparin drip now. Eliquis at home.   Chronic combined systolic and diastolic heart failure Acute on chronic kidney disease stage III DM History OSA HTN  @PROBHOSP @  LOS: 5 days    Anaily Ashbaugh M 12/25/2015  . .prob

## 2015-12-25 NOTE — Progress Notes (Signed)
ANTICOAGULATION CONSULT NOTE - Follow Up Consult  Pharmacy Consult for Heparin (apixaban on hold) Indication: atrial fibrillation  Allergies  Allergen Reactions  . Piroxicam Other (See Comments)    REACTION: HEADACHE    Patient Measurements: Height: 5\' 5"  (165.1 cm) Weight: 163 lb 8 oz (74.2 kg) IBW/kg (Calculated) : 57 Heparin Dosing Weight: 71.4 kg  Vital Signs: Temp: 98.1 F (36.7 C) (10/29 0600) Temp Source: Oral (10/29 0600) BP: 132/87 (10/29 0600) Pulse Rate: 94 (10/29 0600)  Labs:  Recent Labs  12/22/15 1855  12/23/15 0700  12/23/15 2034 12/24/15 0612 12/24/15 1758 12/24/15 1900 12/25/15 0358 12/25/15 1206  HGB  --   < >  --   --  16.6* 16.1*  --   --  14.6  --   HCT  --   < >  --   --  46.7* 45.6  --   --  42.6  --   PLT  --   < >  --   --  324 348  --   --  369  --   APTT 65*  --   --   < >  --   --   --  57* 81* 113*  HEPARINUNFRC  --   --   --   < >  --   --  1.10*  --  1.14* 1.22*  CREATININE  --   < >  --   --  2.72* 2.80*  --   --  2.51*  --   TROPONINI 0.99*  --  1.03*  --   --   --   --   --   --   --   < > = values in this interval not displayed.  Estimated Creatinine Clearance: 17.1 mL/min (by C-G formula based on SCr of 2.51 mg/dL (H)).   Assessment: Heparin while apixaban continues to be held, s/p ex-lap for SB0, aPTT is therapeutic x 1 after rate increase, using aPTT to dose heparin for now given apixaban influence on anti-Xa levels .   APTT elevated at 113, Heparin level 1.22 on 1250 units/hr -- accumulated after being therapeutic x2 doses. Levels still not correlating so using aPTT for now.   Goal of Therapy:  Heparin level 0.3-0.7 units/ml aPTT 66-102 seconds Monitor platelets by anticoagulation protocol: Yes   Plan:  -Decrease heparin to 1150 units/hr.  -Follow-up aPTT and heparin level in 8 hours.  -Monitor for s/sx of bleeding -Follow-up correlation of heparin level and aPTT  Sloan Leiter, PharmD, BCPS Clinical  Pharmacist [phone] 12/25/2015,1:26 PM

## 2015-12-25 NOTE — Progress Notes (Signed)
PROGRESS NOTE    Wendy Kerr  [id number] DOB: 03-Aug-1930 DOA: 12/18/2015 PCP: Hoyt Koch, MD   Outpatient Specialists:     Brief Narrative:  Wendy Kerr is a 82 y.o. female with medical history significant of CHF with EF 40-45%, CKD.  Patient presents to the ED with c/o abdominal pain, N/V.  Abdominal pain is throughout the abdomen and especially in lower abdomen.  Symptoms are severe.  No fever, chills, diarrhea, dysuria.  No blood in vomit or BM.  50lb weight loss over last couple of months.  Recent admit and treatment for CHF.  she ate fried chicken from a gas station and symptoms onset shortly there after.  Nothing makes symptoms better or worse.  CT abd pelvis suggestive of enteritis, no contrast used due to kidney function, lactate trending down and is now WNL on recheck, pain control achieved with IV dilaudid.  Creatinine is 2.5 (essentially unchanged from recent labs).   Assessment & Plan:   Principal Problem:   Enteritis presumed infectious Active Problems:   Diabetes mellitus type 2 in obese [facility])   Essential hypertension   Chronic combined systolic and diastolic heart failure (HCC)   OSA (obstructive sleep apnea)   CKD stage 3 due to type 2 diabetes mellitus (HCC)   AKI (acute kidney injury) (HCC)   Nausea   Poisoning by digoxin   Preoperative cardiovascular examination   SBO (small bowel obstruction)  1- SBO;  Initially patient was thought to have enteritis. She continue to vomit. Repeat KUB with dilation Small bowel.  Surgery consulted. Patient had NG tube placed yielding 3 L of fluid from stomach.  IV cipro and flagyl discontinue post op/  Patient underwent exploratory laparotomy 10-27 found to have  Distal SBO involving the jejunum with necrosis, but no perforation, intertwined with right Fallopian tube and ovary.  Segment 1 foot long that was resected. Still with NG tube in place. Documented 800 NG  Management per sx  Awaiting for bowel  function to recover.   DM2 - appears to be diet controlled -SSI  HTN - PRN hydralazine.   Cronic diastolic CHF - -Hold imdur, unable to tolerates oral.  -Hold IV lasix . Cr stable.  -Continue to monitor on telemetry.  -Unable to to take oral Cardizem.  -Cardiology consulted, pre op evaluation and management.  -monitor on IV fluids.   AKI on CKD - cr one month ago at 2.3. Hold lasix.  She is negative 6 l.  Continue with  IV fluids, monitor for pulmonary edema. Decrease fluids to 45 cc.  Cr at 2.5.   A fib; was on eliquis.  On IV heparin  Monitor on telemetry.  If hr increases might need to be on Cardizem gtt   DVT prophylaxis:  SCD's  Code Status: Full Code   Family Communication: Patient   Disposition Plan:  To be determine, awaiting improvement of bowel function.    Consultants:   Surgery  Cardiology    Subjective: Doing well post sx. Mild abdominal discomfort.  NG tube in place.  Denies dyspnea.    Objective: Vitals:   12/24/15 1414 12/24/15 2340 12/25/15 0500 12/25/15 0600  BP: 137/83 132/73  132/87  Pulse: 93 97  94  Resp: 18 16  16   Temp: 98.3 F (36.8 C) 98 F (36.7 C)  98.1 F (36.7 C)  TempSrc: Oral Oral  Oral  SpO2: 97% 95%  95%  Weight:   74.2 kg (163 lb 8 oz)  Height:        Intake/Output Summary (Last 24 hours) at 12/25/15 0957 Last data filed at 12/25/15 0600  Gross per 24 hour  Intake          1528.25 ml  Output             1400 ml  Net           128.25 ml   Filed Weights   12/19/15 0119 12/24/15 0437 12/25/15 0500  Weight: 71.4 kg (157 lb 6.4 oz) 71.4 kg (157 lb 4.8 oz) 74.2 kg (163 lb 8 oz)    Examination:  General exam: NAD, NGT in place Respiratory system: Clear to auscultation. Respiratory effort normal. Cardiovascular system: S1 & S2 heard, RRR. No JVD, murmurs, rubs, gallops or clicks. No pedal edema. Gastrointestinal system: Abdomen is   distended, soft and mild tender. No organomegaly or masses felt.   Decreased bowel sounds heard,  Central nervous system: alert and oriented    Data Reviewed: I have personally reviewed following labs and imaging studies  CBC:  Recent Labs Lab 12/22/15 0806 12/23/15 0553 12/23/15 2034 12/24/15 0612 12/25/15 0358  WBC 9.5 12.7* 9.2 11.6* 12.9*  NEUTROABS  --   --  7.8* 10.6*  --   HGB 14.5 15.5* 16.6* 16.1* 14.6  HCT 40.2 43.4 46.7* 45.6 42.6  MCV 76.7* 78.8 79.6 78.4 80.2  PLT 335 297 324 348 0000000   Basic Metabolic Panel:  Recent Labs Lab 12/22/15 0516 12/23/15 0553 12/23/15 2034 12/24/15 0612 12/24/15 1100 12/25/15 0358  NA 133* 135 139 139  --  144  K 4.0 4.2 3.4* 3.4*  --  3.6  CL 92* 93* 97* 98*  --  101  CO2 25 25 29 24   --  29  GLUCOSE 125* 130* 129* 129*  --  109*  BUN 106* 111* 105* 106*  --  106*  CREATININE 2.77* 2.84* 2.72* 2.80*  --  2.51*  CALCIUM 9.4 9.0 8.4* 8.2*  --  8.5*  MG  --   --   --   --  2.7*  --    GFR: Estimated Creatinine Clearance: 17.1 mL/min (by C-G formula based on SCr of 2.51 mg/dL (H)). Liver Function Tests:  Recent Labs Lab 12/18/15 1900  AST 45*  ALT 27  ALKPHOS 130*  BILITOT 1.0  PROT 7.9  ALBUMIN 4.6    Recent Labs Lab 12/18/15 1900  LIPASE 48   No results for input(s): AMMONIA in the last 168 hours. Coagulation Profile: No results for input(s): INR, PROTIME in the last 168 hours. Cardiac Enzymes:  Recent Labs Lab 12/22/15 1855 12/23/15 0700  TROPONINI 0.99* 1.03*   BNP (last 3 results)  Recent Labs  11/09/15 1455  PROBNP 1,559.0*   HbA1C: No results for input(s): HGBA1C in the last 72 hours. CBG:  Recent Labs Lab 12/24/15 1157 12/24/15 1626 12/24/15 1945 12/24/15 2346 12/25/15 0831  GLUCAP 128* 132* 132* 138* 133*   Lipid Profile: No results for input(s): CHOL, HDL, LDLCALC, TRIG, CHOLHDL, LDLDIRECT in the last 72 hours. Thyroid Function Tests: No results for input(s): TSH, T4TOTAL, FREET4, T3FREE, THYROIDAB in the last 72 hours. Anemia Panel: No  results for input(s): VITAMINB12, FOLATE, FERRITIN, TIBC, IRON, RETICCTPCT in the last 72 hours. Urine analysis:    Component Value Date/Time   COLORURINE YELLOW 12/18/2015 2239   APPEARANCEUR CLEAR 12/18/2015 2239   LABSPEC 1.011 12/18/2015 2239   PHURINE 5.5 12/18/2015 2239   GLUCOSEU NEGATIVE 12/18/2015  2239   GLUCOSEU Color Interference (A) 08/15/2015 1245   HGBUR SMALL (A) 12/18/2015 2239   BILIRUBINUR NEGATIVE 12/18/2015 2239   KETONESUR NEGATIVE 12/18/2015 2239   PROTEINUR 100 (A) 12/18/2015 2239   UROBILINOGEN Color Interference (A) 08/15/2015 1245   NITRITE NEGATIVE 12/18/2015 2239   LEUKOCYTESUR NEGATIVE 12/18/2015 2239     ) Recent Results (from the past 240 hour(s))  MRSA PCR Screening     Status: None   Collection Time: 12/23/15  2:49 PM  Result Value Ref Range Status   MRSA by PCR NEGATIVE NEGATIVE Final    Comment:        The GeneXpert MRSA Assay (FDA approved for NASAL specimens only), is one component of a comprehensive MRSA colonization surveillance program. It is not intended to diagnose MRSA infection nor to guide or monitor treatment for MRSA infections.       Anti-infectives    Start     Dose/Rate Route Frequency Ordered Stop   12/23/15 1640  cefoTEtan in Dextrose 5% (CEFOTAN) 2-2.08 GM-% IVPB    Comments:  Jones, Tomika   : cabinet override      12/23/15 1640 12/24/15 0444   12/23/15 1300  cefoTEtan (CEFOTAN) 2 g in dextrose 5 % 50 mL IVPB     2 g 100 mL/hr over 30 Minutes Intravenous To ShortStay Surgical 12/23/15 1245 12/23/15 1630   12/19/15 1400  metroNIDAZOLE (FLAGYL) IVPB 500 mg  Status:  Discontinued     500 mg 100 mL/hr over 60 Minutes Intravenous Every 8 hours 12/19/15 1257 12/23/15 1924   12/19/15 1400  ciprofloxacin (CIPRO) IVPB 400 mg  Status:  Discontinued     400 mg 200 mL/hr over 60 Minutes Intravenous Every 24 hours 12/19/15 1307 12/23/15 1924       Radiology Studies: No results found.      Scheduled Meds: .  chlorhexidine  15 mL Mouth Rinse BID  . insulin aspart  0-9 Units Subcutaneous TID WC & HS  . mouth rinse  15 mL Mouth Rinse q12n4p   Continuous Infusions: . sodium chloride 50 mL/hr at 12/24/15 1309  . heparin 1,250 Units/hr (12/25/15 0712)     LOS: 5 days    Time spent: 25 min    Elmarie Shiley, MD [facility] Pager [phone]  If 7PM-7AM, please contact night-coverage [email or web address] Password [email or web address] 12/25/2015, 9:57 AM

## 2015-12-25 NOTE — Progress Notes (Signed)
Subjective:  Sitting up at site of bed currently feeling well without chest pain or shortness of breath.  Objective:  Vital Signs in the last 24 hours: BP 132/87 (BP Location: Right Arm)   Pulse 94   Temp 98.1 F (36.7 C) (Oral)   Resp 16   Ht 5\' 5"  (1.651 m)   Wt 74.2 kg (163 lb 8 oz)   SpO2 95%   BMI 27.21 kg/m   Physical Exam: Elderly mildly obese black female sitting in bed in no acute distress Lungs:  Clear Cardiac:  Regular rhythm, normal S1 and S2, no S3 Abdomen:  Soft, nontender, no masses Extremities:  No edema present  Intake/Output from previous day: 10/28 0701 - 10/29 0700 In: 1528.3 [I.V.:1428.3; IV Piggyback:100] Out: 1700 [Urine:900; Emesis/NG output:800]  Weight Filed Weights   12/19/15 0119 12/24/15 0437 12/25/15 0500  Weight: 71.4 kg (157 lb 6.4 oz) 71.4 kg (157 lb 4.8 oz) 74.2 kg (163 lb 8 oz)    Lab Results: Basic Metabolic Panel:  Recent Labs  12/24/15 0612 12/25/15 0358  NA 139 144  K 3.4* 3.6  CL 98* 101  CO2 24 29  GLUCOSE 129* 109*  BUN 106* 106*  CREATININE 2.80* 2.51*   CBC:  Recent Labs  12/23/15 2034 12/24/15 0612 12/25/15 0358  WBC 9.2 11.6* 12.9*  NEUTROABS 7.8* 10.6*  --   HGB 16.6* 16.1* 14.6  HCT 46.7* 45.6 42.6  MCV 79.6 78.4 80.2  PLT 324 348 369   Cardiac Panel (last 3 results)  Recent Labs  12/22/15 1855 12/23/15 0700  TROPONINI 0.99* 1.03*    Telemetry: Sinus rhythm. She had occasional PVCs as well as 1 slow Ron of accelerated ventricular rhythm or slow ventricular tachycardia earlier today.  Assessment/Plan:  1.  Chronic diastolic heart failure currently volume neutral 2.  Acute on chronic kidney disease creatinine reasonable 3.  History of atrial fibrillation currently on IV heparin 4. Ventricular ectopy with 1 slow run of several beats of ventricular rhythm together-I would not pursue treatment of this further.     Kerry Hough  MD [facility]  12/25/2015, 1:54 PM

## 2015-12-25 NOTE — Progress Notes (Signed)
ANTICOAGULATION CONSULT NOTE - Follow Up Consult  Pharmacy Consult for Heparin (apixaban on hold) Indication: atrial fibrillation  Allergies  Allergen Reactions  . Piroxicam Other (See Comments)    REACTION: HEADACHE    Patient Measurements: Height: 5\' 5"  (165.1 cm) Weight: 163 lb 8 oz (74.2 kg) IBW/kg (Calculated) : 57  Vital Signs: Temp: 98.1 F (36.7 C) (10/29 0600) Temp Source: Oral (10/29 0600) BP: 132/87 (10/29 0600) Pulse Rate: 94 (10/29 0600)  Labs:  Recent Labs  12/22/15 1855  12/23/15 0700 12/23/15 0729 12/23/15 1338 12/23/15 2034 12/24/15 0612 12/24/15 1758 12/24/15 1900 12/25/15 0358  HGB  --   < >  --   --   --  16.6* 16.1*  --   --  14.6  HCT  --   < >  --   --   --  46.7* 45.6  --   --  42.6  PLT  --   < >  --   --   --  324 348  --   --  369  APTT 65*  --   --  87* 122*  --   --   --  57* 81*  HEPARINUNFRC  --   --   --  2.08*  --   --   --  1.10*  --  1.14*  CREATININE  --   < >  --   --   --  2.72* 2.80*  --   --  2.51*  TROPONINI 0.99*  --  1.03*  --   --   --   --   --   --   --   < > = values in this interval not displayed.  Estimated Creatinine Clearance: 17.1 mL/min (by C-G formula based on SCr of 2.51 mg/dL (H)).   Assessment: Heparin while apixaban continues to be held, s/p ex-lap for SB0, aPTT is therapeutic x 1 after rate increase, using aPTT to dose heparin for now given apixaban influence on anti-Xa levels   Goal of Therapy:  Heparin level 0.3-0.7 units/ml Monitor platelets by anticoagulation protocol: Yes   Plan:  -Cont heparin 1250 units/hr -1200 aPTT/HL  Narda Bonds 12/25/2015,6:20 AM

## 2015-12-25 NOTE — Progress Notes (Signed)
ANTICOAGULATION CONSULT NOTE - Follow Up Consult  Pharmacy Consult for Heparin (apixaban on hold) Indication: atrial fibrillation  Allergies  Allergen Reactions  . Piroxicam Other (See Comments)    REACTION: HEADACHE    Patient Measurements: Height: 5\' 5"  (165.1 cm) Weight: 163 lb 8 oz (74.2 kg) IBW/kg (Calculated) : 57  Vital Signs: Temp: 98.7 F (37.1 C) (10/29 1835) Temp Source: Oral (10/29 2032) BP: 126/69 (10/29 2032) Pulse Rate: 97 (10/29 2032)  Labs:  Recent Labs  12/23/15 0700  12/23/15 2034 12/24/15 0612  12/25/15 0358 12/25/15 1206 12/25/15 2143  HGB  --   --  16.6* 16.1*  --  14.6  --   --   HCT  --   --  46.7* 45.6  --  42.6  --   --   PLT  --   --  324 348  --  369  --   --   APTT  --   < >  --   --   < > 81* 113* 84*  HEPARINUNFRC  --   < >  --   --   < > 1.14* 1.22* 0.93*  CREATININE  --   --  2.72* 2.80*  --  2.51*  --   --   TROPONINI 1.03*  --   --   --   --   --   --   --   < > = values in this interval not displayed.  Estimated Creatinine Clearance: 17.1 mL/min (by C-G formula based on SCr of 2.51 mg/dL (H)).   Assessment: Heparin while apixaban continues to be held, s/p ex-lap for SB0, aPTT is therapeutic x 1 after rate decrease, using aPTT to dose heparin for now given apixaban influence on anti-Xa levels   Goal of Therapy:  Heparin level 0.3-0.7 units/ml Monitor platelets by anticoagulation protocol: Yes   Plan:  -Cont heparin 1150 units/hr -Confirmatory aPTT/HL with AM labs  Narda Bonds 12/25/2015,10:46 PM

## 2015-12-26 ENCOUNTER — Encounter (HOSPITAL_COMMUNITY): Payer: Self-pay | Admitting: General Surgery

## 2015-12-26 LAB — CBC
HCT: 40.2 % (ref 36.0–46.0)
Hemoglobin: 13.8 g/dL (ref 12.0–15.0)
MCH: 27.1 pg (ref 26.0–34.0)
MCHC: 34.3 g/dL (ref 30.0–36.0)
MCV: 79 fL (ref 78.0–100.0)
PLATELETS: 427 10*3/uL — AB (ref 150–400)
RBC: 5.09 MIL/uL (ref 3.87–5.11)
RDW: 14.9 % (ref 11.5–15.5)
WBC: 13 10*3/uL — ABNORMAL HIGH (ref 4.0–10.5)

## 2015-12-26 LAB — GLUCOSE, CAPILLARY
GLUCOSE-CAPILLARY: 119 mg/dL — AB (ref 65–99)
Glucose-Capillary: 111 mg/dL — ABNORMAL HIGH (ref 65–99)
Glucose-Capillary: 125 mg/dL — ABNORMAL HIGH (ref 65–99)
Glucose-Capillary: 107 mg/dL — ABNORMAL HIGH (ref 65–99)

## 2015-12-26 LAB — BASIC METABOLIC PANEL
Anion gap: 14 (ref 5–15)
BUN: 112 mg/dL — AB (ref 6–20)
CALCIUM: 8.9 mg/dL (ref 8.9–10.3)
CO2: 30 mmol/L (ref 22–32)
CREATININE: 2.4 mg/dL — AB (ref 0.44–1.00)
Chloride: 100 mmol/L — ABNORMAL LOW (ref 101–111)
GFR calc Af Amer: 20 mL/min — ABNORMAL LOW (ref >60)
GFR, EST NON AFRICAN AMERICAN: 18 mL/min — AB (ref >60)
GLUCOSE: 121 mg/dL — AB (ref 65–99)
Potassium: 3.3 mmol/L — ABNORMAL LOW (ref 3.5–5.1)
Sodium: 144 mmol/L (ref 135–145)

## 2015-12-26 LAB — APTT: aPTT: 81 seconds — ABNORMAL HIGH (ref 24–36)

## 2015-12-26 LAB — HEPARIN LEVEL (UNFRACTIONATED): HEPARIN UNFRACTIONATED: 0.87 [IU]/mL — AB (ref 0.30–0.70)

## 2015-12-26 MED ORDER — POTASSIUM CHLORIDE 10 MEQ/100ML IV SOLN
10.0000 meq | Freq: Once | INTRAVENOUS | Status: AC
  Administered 2015-12-26: 10 meq via INTRAVENOUS
  Filled 2015-12-26: qty 100

## 2015-12-26 NOTE — Progress Notes (Signed)
3 Days Post-Op  Subjective: She is doing pretty well this AM.  She really wants some sips of Ginger ale, she has allot of drainage from the NG, but I think allot of it is ice chips.  She is having some flatus and a BM this AM.  BS are still pretty hypoactive.    Objective: Vital signs in last 24 hours: Temp:  [98.7 F (37.1 C)] 98.7 F (37.1 C) (10/29 1835) Pulse Rate:  [86-97] 97 (10/29 2032) Resp:  [17-19] 19 (10/29 2032) BP: (126-132)/(69-72) 126/69 (10/29 2032) SpO2:  [100 %] 100 % (10/29 2032) Weight:  [74.1 kg (163 lb 5.8 oz)] 74.1 kg (163 lb 5.8 oz) (10/30 0500) Last BM Date: 12/25/15 NG 3100 IV fluids 1423 BM x 1 Afebrile, VSS Creatinine and WBC both up and stable Intake/Output from previous day: 10/29 0701 - 10/30 0700 In: 1643.6 [P.O.:180; I.V.:1423.6; NG/GT:40] Out: 4001 [Urine:900; Emesis/NG output:3100; Stool:1] Intake/Output this shift: No intake/output data recorded.  General appearance: alert, cooperative and no distress Resp: clear to auscultation bilaterally GI: soft, BS hypoactive, + flatus/BM x 1, Incision looks fine.    Lab Results:   Recent Labs  12/25/15 0358 12/26/15 0301  WBC 12.9* 13.0*  HGB 14.6 13.8  HCT 42.6 40.2  PLT 369 427*    BMET  Recent Labs  12/25/15 0358 12/26/15 0301  NA 144 144  K 3.6 3.3*  CL 101 100*  CO2 29 30  GLUCOSE 109* 121*  BUN 106* 112*  CREATININE 2.51* 2.40*  CALCIUM 8.5* 8.9   PT/INR No results for input(s): LABPROT, INR in the last 72 hours.  No results for input(s): AST, ALT, ALKPHOS, BILITOT, PROT, ALBUMIN in the last 168 hours.   Lipase     Component Value Date/Time   LIPASE 48 12/18/2015 1900     Studies/Results: No results found.  Medications: . chlorhexidine  15 mL Mouth Rinse BID  . insulin aspart  0-9 Units Subcutaneous TID WC & HS  . mouth rinse  15 mL Mouth Rinse q12n4p   . sodium chloride 45 mL/hr at 12/26/15 0529  . heparin 1,150 Units/hr (12/26/15 0339)     Assessment/Plan  Distal SBO involving the jejunum with necrosis, but no perforation, intertwined with right Fallopian tube and ovary - only surgery status post partial hysterectomy 1972 [place], 12/23/15 Dr. 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  Add IS.    LOS: 6 days    Wendy Kerr 12/26/2015 [phone]

## 2015-12-26 NOTE — Progress Notes (Signed)
Physical Therapy Treatment Patient Details Name: Wendy Kerr MRN: [id number] DOB: 1931-01-03 Today's Date: 12/26/2015    History of Present Illness Nollie Lujan Johnsonis a 82 y.o.femalewith medical history significant of CHF with EF 40-45%, CKD.  She was admitted due to severe abdominal pain, N/V.  Has had 50# weight loss in last few months.  Found to have enteritis.  Patient now s/p exploratory lap for SBO with segment resected on 12/23/15.    PT Comments    Patient tolerated increased gait distance and stair training this session. Pt fatigued end of session. Continue to progress as tolerated with anticipated d/c home with HHPT.   Follow Up Recommendations  Home health PT;Supervision for mobility/OOB     Equipment Recommendations  Rolling walker with 5" wheels;3in1 (PT)    Recommendations for Other Services       Precautions / Restrictions Precautions Precautions: Fall Precaution Comments: NG tube Restrictions Weight Bearing Restrictions: No    Mobility  Bed Mobility               General bed mobility comments: Patient in chair  Transfers Overall transfer level: Needs assistance Equipment used: Rolling walker (2 wheeled) Transfers: Sit to/from [employer] Transfers Sit to Stand: Min guard Stand pivot transfers: Min guard       General transfer comment: cues for hand placement; min guard for safety from recliner and BSC  Ambulation/Gait Ambulation/Gait assistance: Min guard Ambulation Distance (Feet): 200 Feet Assistive device: Rolling walker (2 wheeled) Gait Pattern/deviations: Step-through pattern;Decreased stride length;Trunk flexed Gait velocity: decreased   General Gait Details: cues for posture; slow, steady gait   Stairs Stairs: Yes Stairs assistance: Min assist Stair Management: One rail Left;Forwards (HHA and rail) Number of Stairs: 4 General stair comments: cues for step to pattern; assist for balance and weight  shifting  Wheelchair Mobility    Modified Rankin (Stroke Patients Only)       Balance             Standing balance-Leahy Scale: Poor                      Cognition Arousal/Alertness: Awake/alert Behavior During Therapy: WFL for tasks assessed/performed;Flat affect Overall Cognitive Status: Within Functional Limits for tasks assessed                      Exercises      General Comments        Pertinent Vitals/Pain Pain Assessment: No/denies pain    Home Living                      Prior Function            PT Goals (current goals can now be found in the care plan section) Acute Rehab PT Goals Patient Stated Goal: To return to independent PT Goal Formulation: With patient Time For Goal Achievement: 12/31/15 Potential to Achieve Goals: Good Progress towards PT goals: Progressing toward goals    Frequency    Min 3X/week      PT Plan Current plan remains appropriate    Co-evaluation             End of Session Equipment Utilized During Treatment: Gait belt Activity Tolerance: Patient tolerated treatment well Patient left: in chair;with call bell/phone within reach     Time: 1456-1534 PT Time Calculation (min) (ACUTE ONLY): 38 min  Charges:  $Gait Training: 23-37 mins $Therapeutic Activity: 8-22 mins  G Codes:      Salina April, PTA Pager: [phone]   12/26/2015, 3:39 PM

## 2015-12-26 NOTE — Progress Notes (Addendum)
PROGRESS NOTE    Wendy Kerr  [id number] DOB: Oct 09, 1930 DOA: 12/18/2015 PCP: Hoyt Koch, MD   Outpatient Specialists:     Brief Narrative:  Wendy Kerr is a 82 y.o. female with medical history significant of CHF with EF 40-45%, CKD.  Patient presents to the ED with c/o abdominal pain, N/V.  Abdominal pain is throughout the abdomen and especially in lower abdomen.  Symptoms are severe.  No fever, chills, diarrhea, dysuria.  No blood in vomit or BM.  50lb weight loss over last couple of months.  Recent admit and treatment for CHF.  she ate fried chicken from a gas station and symptoms onset shortly there after.  Nothing makes symptoms better or worse.  CT abd pelvis suggestive of enteritis, no contrast used due to kidney function, lactate trending down and is now WNL on recheck, pain control achieved with IV dilaudid.  Creatinine is 2.5 (essentially unchanged from recent labs).   Assessment & Plan:   Principal Problem:   Enteritis presumed infectious Active Problems:   Diabetes mellitus type 2 in obese [facility])   Essential hypertension   Chronic combined systolic and diastolic heart failure (HCC)   OSA (obstructive sleep apnea)   CKD stage 3 due to type 2 diabetes mellitus (HCC)   AKI (acute kidney injury) (HCC)   Nausea   Poisoning by digoxin   Preoperative cardiovascular examination   SBO (small bowel obstruction)  1- SBO;  Initially patient was thought to have enteritis. She continue to vomit. Repeat KUB with dilation Small bowel.  Surgery consulted. Patient had NG tube placed yielding 3 L of fluid from stomach.  IV cipro and flagyl discontinue post op/  Patient underwent exploratory laparotomy 10-27 found to have  Distal SBO involving the jejunum with necrosis, but no perforation, intertwined with right Fallopian tube and ovary.  Segment 1 foot long that was resected. -Management per sx . NG clamp today. She has had 3 BM in 24 hours.  Awaiting for bowel  function to recover.   DM2 - appears to be diet controlled -SSI  HTN - PRN hydralazine.   Leukocytosis; follow trend.   Cronic diastolic CHF - -Hold imdur, unable to tolerates oral.  -Hold IV lasix . Cr stable.  -Continue to monitor on telemetry.  -Unable to to take oral Cardizem.  -Cardiology consulted, pre op evaluation and management.  --NSL fluids. Sips of clears today  -resume oral medications when tolerating oral.   AKI on CKD stage III due to type 2 DM.  - cr one month ago at 2.3. Hold lasix.  She is negative 6 l.  Continue with  IV fluids, monitor for pulmonary edema. Decrease fluids to 45 cc.  Cr at 2.4.  A fib; was on eliquis.  On IV heparin  Monitor on telemetry.  If hr increases might need to be on Cardizem gtt  Hypokalemia ;3.3 replete IV  Also hypokalemia 10-28 replaced.   DVT prophylaxis:  SCD's  Code Status: Full Code   Family Communication: Patient   Disposition Plan:  To be determine, awaiting improvement of bowel function.    Consultants:   Surgery  Cardiology    Subjective: Sitting in the recliner. Has had 3 BM in  24 hours.  No significant abdominal pain.     Objective: Vitals:   12/25/15 0600 12/25/15 1835 12/25/15 2032 12/26/15 0500  BP: 132/87 132/72 126/69   Pulse: 94 86 97   Resp: 16 17 19    Temp:  98.1 F (36.7 C) 98.7 F (37.1 C)    TempSrc: Oral Oral Oral   SpO2: 95% 100% 100%   Weight:    74.1 kg (163 lb 5.8 oz)  Height:        Intake/Output Summary (Last 24 hours) at 12/26/15 1412 Last data filed at 12/26/15 1105  Gross per 24 hour  Intake          1421.91 ml  Output             3651 ml  Net         -2229.09 ml   Filed Weights   12/24/15 0437 12/25/15 0500 12/26/15 0500  Weight: 71.4 kg (157 lb 4.8 oz) 74.2 kg (163 lb 8 oz) 74.1 kg (163 lb 5.8 oz)    Examination:  General exam: NAD, NGT in place Respiratory system: Clear to auscultation. Respiratory effort normal. Cardiovascular system: S1 & S2  heard, RRR. No JVD, murmurs, rubs, gallops or clicks. No pedal edema. Gastrointestinal system: Abdomen is   distended, soft and mild tender. No organomegaly or masses felt.  Decreased bowel sounds heard, dressing mid abdomen.  Central nervous system: alert and oriented    Data Reviewed: I have personally reviewed following labs and imaging studies  CBC:  Recent Labs Lab 12/23/15 0553 12/23/15 2034 12/24/15 0612 12/25/15 0358 12/26/15 0301  WBC 12.7* 9.2 11.6* 12.9* 13.0*  NEUTROABS  --  7.8* 10.6*  --   --   HGB 15.5* 16.6* 16.1* 14.6 13.8  HCT 43.4 46.7* 45.6 42.6 40.2  MCV 78.8 79.6 78.4 80.2 79.0  PLT 297 324 348 369 [id number]*   Basic Metabolic Panel:  Recent Labs Lab 12/23/15 0553 12/23/15 2034 12/24/15 0612 12/24/15 1100 12/25/15 0358 12/26/15 0301  NA 135 139 139  --  144 144  K 4.2 3.4* 3.4*  --  3.6 3.3*  CL 93* 97* 98*  --  101 100*  CO2 25 29 24   --  29 30  GLUCOSE 130* 129* 129*  --  109* 121*  BUN 111* 105* 106*  --  106* 112*  CREATININE 2.84* 2.72* 2.80*  --  2.51* 2.40*  CALCIUM 9.0 8.4* 8.2*  --  8.5* 8.9  MG  --   --   --  2.7*  --   --    GFR: Estimated Creatinine Clearance: 17.9 mL/min (by C-G formula based on SCr of 2.4 mg/dL (H)). Liver Function Tests: No results for input(s): AST, ALT, ALKPHOS, BILITOT, PROT, ALBUMIN in the last 168 hours. No results for input(s): LIPASE, AMYLASE in the last 168 hours. No results for input(s): AMMONIA in the last 168 hours. Coagulation Profile: No results for input(s): INR, PROTIME in the last 168 hours. Cardiac Enzymes:  Recent Labs Lab 12/22/15 1855 12/23/15 0700  TROPONINI 0.99* 1.03*   BNP (last 3 results)  Recent Labs  11/09/15 1455  PROBNP 1,559.0*   HbA1C: No results for input(s): HGBA1C in the last 72 hours. CBG:  Recent Labs Lab 12/25/15 1250 12/25/15 1832 12/25/15 2120 12/26/15 0750 12/26/15 1228  GLUCAP 109* 118* 121* 111* 125*   Lipid Profile: No results for input(s): CHOL,  HDL, LDLCALC, TRIG, CHOLHDL, LDLDIRECT in the last 72 hours. Thyroid Function Tests: No results for input(s): TSH, T4TOTAL, FREET4, T3FREE, THYROIDAB in the last 72 hours. Anemia Panel: No results for input(s): VITAMINB12, FOLATE, FERRITIN, TIBC, IRON, RETICCTPCT in the last 72 hours. Urine analysis:    Component Value Date/Time   COLORURINE YELLOW 12/18/2015  [place] 12/18/2015 2239   LABSPEC 1.011 12/18/2015 2239   PHURINE 5.5 12/18/2015 2239   GLUCOSEU NEGATIVE 12/18/2015 2239   GLUCOSEU Color Interference (A) 08/15/2015 1245   HGBUR SMALL (A) 12/18/2015 2239   BILIRUBINUR NEGATIVE 12/18/2015 2239   KETONESUR NEGATIVE 12/18/2015 2239   PROTEINUR 100 (A) 12/18/2015 2239   UROBILINOGEN Color Interference (A) 08/15/2015 1245   NITRITE NEGATIVE 12/18/2015 2239   LEUKOCYTESUR NEGATIVE 12/18/2015 2239     ) Recent Results (from the past 240 hour(s))  MRSA PCR Screening     Status: None   Collection Time: 12/23/15  2:49 PM  Result Value Ref Range Status   MRSA by PCR NEGATIVE NEGATIVE Final    Comment:        The GeneXpert MRSA Assay (FDA approved for NASAL specimens only), is one component of a comprehensive MRSA colonization surveillance program. It is not intended to diagnose MRSA infection nor to guide or monitor treatment for MRSA infections.       Anti-infectives    Start     Dose/Rate Route Frequency Ordered Stop   12/23/15 1640  cefoTEtan in Dextrose 5% (CEFOTAN) 2-2.08 GM-% IVPB    Comments:  Jones, Tomika   : cabinet override      12/23/15 1640 12/24/15 0444   12/23/15 1300  cefoTEtan (CEFOTAN) 2 g in dextrose 5 % 50 mL IVPB     2 g 100 mL/hr over 30 Minutes Intravenous To [facility] 12/23/15 1245 12/23/15 1630   12/19/15 1400  metroNIDAZOLE (FLAGYL) IVPB 500 mg  Status:  Discontinued     500 mg 100 mL/hr over 60 Minutes Intravenous Every 8 hours 12/19/15 1257 12/23/15 1924   12/19/15 1400  ciprofloxacin (CIPRO) IVPB 400 mg   Status:  Discontinued     400 mg 200 mL/hr over 60 Minutes Intravenous Every 24 hours 12/19/15 1307 12/23/15 1924       Radiology Studies: No results found.      Scheduled Meds: . chlorhexidine  15 mL Mouth Rinse BID  . insulin aspart  0-9 Units Subcutaneous TID WC & HS  . mouth rinse  15 mL Mouth Rinse q12n4p   Continuous Infusions: . heparin 1,150 Units/hr (12/26/15 0339)     LOS: 6 days    Time spent: 25 min    Elmarie Shiley, MD [facility] Pager [phone]  If 7PM-7AM, please contact night-coverage [email or web address] Password [email or web address] 12/26/2015, 2:12 PM

## 2015-12-26 NOTE — Progress Notes (Addendum)
ANTICOAGULATION CONSULT NOTE - Follow Up Consult  Pharmacy Consult:  Heparin Indication: atrial fibrillation  Allergies  Allergen Reactions  . Piroxicam Other (See Comments)    REACTION: HEADACHE    Patient Measurements: Height: 5\' 5"  (165.1 cm) Weight: 163 lb 5.8 oz (74.1 kg) IBW/kg (Calculated) : 57 Heparin Dosing Weight: 71 kg  Vital Signs:    Labs:  Recent Labs  12/24/15 0612  12/25/15 0358 12/25/15 1206 12/25/15 2143 12/26/15 0301  HGB 16.1*  --  14.6  --   --  13.8  HCT 45.6  --  42.6  --   --  40.2  PLT 348  --  369  --   --  427*  APTT  --   < > 81* 113* 84* 81*  HEPARINUNFRC  --   < > 1.14* 1.22* 0.93* 0.87*  CREATININE 2.80*  --  2.Wendy*  --   --  2.40*  < > = values in this interval not displayed.  Estimated Creatinine Clearance: 17.9 mL/min (by C-G formula based on SCr of 2.4 mg/dL (H)).   Assessment: Wendy Kerr with history of Afib on Eliquis PTA.  Patient was transitioned to IV heparin for surgery.  Now s/p ex-lap with SBR on 12/23/15 with plan to clamp NGT today.  Eliquis continues to falsely elevate heparin levels.  Currently using aPTT to guide heparin dosing and aPTT is therapeutic.  No bleeding reported.   Goal of Therapy:  Heparin level 0.3-0.7 units/ml aPTT 66-102 seconds Monitor platelets by anticoagulation protocol: Yes    Plan:  - Continue heparin gtt at 1150 units/hr - Daily aPTT, heparin level, CBC - Consider supplementing K+   Arijana Narayan D. Mina Marble, PharmD, BCPS Pager:  [phone] 12/26/2015, 11:38 AM

## 2015-12-27 LAB — BASIC METABOLIC PANEL
Anion gap: 15 (ref 5–15)
BUN: 114 mg/dL — AB (ref 6–20)
CALCIUM: 8.8 mg/dL — AB (ref 8.9–10.3)
CHLORIDE: 103 mmol/L (ref 101–111)
CO2: 26 mmol/L (ref 22–32)
CREATININE: 2.15 mg/dL — AB (ref 0.44–1.00)
GFR calc non Af Amer: 20 mL/min — ABNORMAL LOW (ref >60)
GFR, EST AFRICAN AMERICAN: 23 mL/min — AB (ref >60)
GLUCOSE: 109 mg/dL — AB (ref 65–99)
Potassium: 3.9 mmol/L (ref 3.5–5.1)
Sodium: 144 mmol/L (ref 135–145)

## 2015-12-27 LAB — GLUCOSE, CAPILLARY
GLUCOSE-CAPILLARY: 101 mg/dL — AB (ref 65–99)
GLUCOSE-CAPILLARY: 135 mg/dL — AB (ref 65–99)
GLUCOSE-CAPILLARY: 113 mg/dL — AB (ref 65–99)
Glucose-Capillary: 165 mg/dL — ABNORMAL HIGH (ref 65–99)

## 2015-12-27 LAB — CBC
HEMATOCRIT: 38.5 % (ref 36.0–46.0)
Hemoglobin: 13.5 g/dL (ref 12.0–15.0)
MCH: 28.2 pg (ref 26.0–34.0)
MCHC: 35.1 g/dL (ref 30.0–36.0)
MCV: 80.4 fL (ref 78.0–100.0)
PLATELETS: 450 10*3/uL — AB (ref 150–400)
RBC: 4.79 MIL/uL (ref 3.87–5.11)
RDW: 15.6 % — AB (ref 11.5–15.5)
WBC: 11.7 10*3/uL — AB (ref 4.0–10.5)

## 2015-12-27 LAB — APTT: aPTT: 94 seconds — ABNORMAL HIGH (ref 24–36)

## 2015-12-27 LAB — HEPARIN LEVEL (UNFRACTIONATED): Heparin Unfractionated: 0.71 IU/mL — ABNORMAL HIGH (ref 0.30–0.70)

## 2015-12-27 MED ORDER — ISOSORBIDE MONONITRATE ER 30 MG PO TB24
30.0000 mg | ORAL_TABLET | Freq: Every day | ORAL | Status: DC
  Administered 2015-12-27 – 2016-01-01 (×6): 30 mg via ORAL
  Filled 2015-12-27 (×6): qty 1

## 2015-12-27 NOTE — Progress Notes (Signed)
ANTICOAGULATION CONSULT NOTE - Follow Up Consult  Pharmacy Consult:  Heparin Indication: atrial fibrillation  Allergies  Allergen Reactions  . Piroxicam Other (See Comments)    REACTION: HEADACHE    Patient Measurements: Height: 5\' 5"  (165.1 cm) Weight: 163 lb 5.8 oz (74.1 kg) IBW/kg (Calculated) : 57 Heparin Dosing Weight: 71 kg  Vital Signs:    Labs:  Recent Labs  12/25/15 0358  12/25/15 2143 12/26/15 0301 12/27/15 0529  HGB 14.6  --   --  13.8 13.5  HCT 42.6  --   --  40.2 38.5  PLT 369  --   --  427* 450*  APTT 81*  < > 84* 81* 94*  HEPARINUNFRC 1.14*  < > 0.93* 0.87* 0.71*  CREATININE 2.51*  --   --  2.40* 2.15*  < > = values in this interval not displayed.  Estimated Creatinine Clearance: 20 mL/min (by C-G formula based on SCr of 2.15 mg/dL (H)).   Assessment: 57 YOF with history of Afib on Eliquis PTA.  Patient was transitioned to IV heparin for surgery.  Now s/p ex-lap with SBR on 12/23/15 and started on clears.  Eliquis continues to falsely elevate heparin levels.  Currently using aPTT to guide heparin dosing and aPTT is therapeutic.  No bleeding reported.   Goal of Therapy:  Heparin level 0.3-0.7 units/ml aPTT 66-102 seconds Monitor platelets by anticoagulation protocol: Yes    Plan:  - Continue heparin gtt at 1150 units/hr - Daily aPTT, heparin level, CBC - F/U with diet advancement to resume Eliquis   Donjuan Robison D. Mina Marble, PharmD, BCPS Pager:  [phone] 12/27/2015, 10:15 AM

## 2015-12-27 NOTE — Care Management Important Message (Signed)
Important Message  Patient Details  Name: LATIQUA GRANDPRE MRN: [id number] Date of Birth: 1930-05-27   Medicare Important Message Given:  Yes    Orbie Pyo 12/27/2015, 12:43 PM

## 2015-12-27 NOTE — Progress Notes (Signed)
4 Days Post-Op  Subjective: Pt reports multiple BM's, still not allot of bowel sounds.  Site is OK, she is up to chair.  I saw her walking yesterday.    Objective: Vital signs in last 24 hours: Temp:  [98 F (36.7 C)-98.3 F (36.8 C)] 98.3 F (36.8 C) (10/30 2004) Pulse Rate:  [66] 66 (10/30 2004) Resp:  [16-19] 19 (10/30 2004) BP: (132)/(80-85) 132/85 (10/30 2004) SpO2:  [95 %-96 %] 96 % (10/30 2004) Last BM Date: 12/26/15 PO 60 Ng 150 BM x 2 Afebrile, VSS Creatinine is better. WBC trending down also No film Intake/Output from previous day: 10/30 0701 - 10/31 0700 In: 621.8 [P.O.:60; I.V.:211.8; NG/GT:350] Out: 450 [Urine:300; Emesis/NG output:150] Intake/Output this shift: No intake/output data recorded.  General appearance: alert, cooperative and no distress Chest wall: no tenderness, Chest clear to ascultation  GI: soft, incision OK, few hypoactive BS, she reports multiple BM's pt nurse confirms BM's.  Lab Results:   Recent Labs  12/26/15 0301 12/27/15 0529  WBC 13.0* 11.7*  HGB 13.8 13.5  HCT 40.2 38.5  PLT 427* 450*    BMET  Recent Labs  12/26/15 0301 12/27/15 0529  NA 144 144  K 3.3* 3.9  CL 100* 103  CO2 30 26  GLUCOSE 121* 109*  BUN 112* 114*  CREATININE 2.40* 2.15*  CALCIUM 8.9 8.8*   PT/INR No results for input(s): LABPROT, INR in the last 72 hours.  No results for input(s): AST, ALT, ALKPHOS, BILITOT, PROT, ALBUMIN in the last 168 hours.   Lipase     Component Value Date/Time   LIPASE 48 12/18/2015 1900     Studies/Results: No results found.  Medications: . chlorhexidine  15 mL Mouth Rinse BID  . insulin aspart  0-9 Units Subcutaneous TID WC & HS  . mouth rinse  15 mL Mouth Rinse q12n4p    Assessment/Plan Distal SBO involving the jejunum with necrosis, but no perforation, intertwined with right Fallopian tube and ovary - only surgery status post partial hysterectomy 1972 [place], 12/23/15 Dr. 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

## 2015-12-27 NOTE — Progress Notes (Signed)
PROGRESS NOTE    Wendy Kerr  [id number] DOB: November 26, 1930 DOA: 12/18/2015 PCP: Hoyt Koch, MD   Outpatient Specialists:     Brief Narrative:  Wendy Kerr is a 82 y.o. female with medical history significant of CHF with EF 40-45%, CKD.  Patient presents to the ED with c/o abdominal pain, N/V.  Abdominal pain is throughout the abdomen and especially in lower abdomen.  Symptoms are severe.  No fever, chills, diarrhea, dysuria.  No blood in vomit or BM.  50lb weight loss over last couple of months.  Recent admit and treatment for CHF.  she ate fried chicken from a gas station and symptoms onset shortly there after.  Nothing makes symptoms better or worse.  CT abd pelvis suggestive of enteritis, no contrast used due to kidney function, lactate trending down and is now WNL on recheck, pain control achieved with IV dilaudid.  Creatinine is 2.5 (essentially unchanged from recent labs).   Patient admitted 10-22 with abdominal pain, nausea, vomiting. nitially patient was thought to have enteritis. She continue to vomit. Repeat KUB with dilation Small bowel. Surgery consulted. Patient had NG tube placed yielding 3 L of fluid from stomach. Patient underwent exploratory laparotomy 10-27 found to have  Distal SBO involving the jejunum with necrosis, but no perforation, intertwined with right Fallopian tube and ovary.  Segment 1 foot long that was resected.   Assessment & Plan:   Principal Problem:   Enteritis presumed infectious Active Problems:   Diabetes mellitus type 2 in obese [facility])   Essential hypertension   Chronic combined systolic and diastolic heart failure (HCC)   OSA (obstructive sleep apnea)   CKD stage 3 due to type 2 diabetes mellitus (HCC)   AKI (acute kidney injury) (HCC)   Nausea   Poisoning by digoxin   Preoperative cardiovascular examination   SBO (small bowel obstruction)  1- SBO;  Distal SBO involving the jejunum with necrosis, but no perforation,  intertwined with right Fallopian tube and ovary.  -Initially patient was thought to have enteritis. She continue to vomit. Repeat KUB with dilation Small bowel.  -Surgery consulted. Patient had NG tube placed yielding 3 L of fluid from stomach.  -IV cipro and flagyl discontinue post op/  -Patient underwent exploratory laparotomy 10-27 found to have  Distal SBO involving the jejunum with necrosis, but no perforation, intertwined with right Fallopian tube and ovary.  Segment 1 foot long that was resected. -Management per sx . NG clamp to be removed 10-31.    DM2 - appears to be diet controlled -SSI  HTN - PRN hydralazine.   Leukocytosis; follow trend.   Cronic diastolic CHF - -Resume  Imdur.  -Continue to monitor on telemetry.  -Cardiology consulted, pre op evaluation and management.  --NSL fluids.  -monitor volume status, might be able to resume lasix 11-01  AKI on CKD stage III due to type 2 DM.  - cr one month ago at 2.3. She is negative 8 l.  Cr  Stable.   A fib; was on eliquis.  On IV heparin --- change to eliquis when ok by surgical team.  Monitor on telemetry.  If hr increases might need to be on Cardizem gtt Resume Caredizem when HR increases.   Hypokalemia; resolved.   DVT prophylaxis:  SCD's  Code Status: Full Code   Family Communication: Patient   Disposition Plan:  To be determine, awaiting improvement of bowel function.    Consultants:   Surgery  Cardiology  Subjective: She is feeling better, just had another BM. Will start clears today. Denies dyspnea.    Objective: Vitals:   12/25/15 2032 12/26/15 0500 12/26/15 1745 12/26/15 2004  BP: 126/69  132/80 132/85  Pulse: 97  66 66  Resp: 19  16 19   Temp:   98 F (36.7 C) 98.3 F (36.8 C)  TempSrc: Oral  Oral Oral  SpO2: 100%  95% 96%  Weight:  74.1 kg (163 lb 5.8 oz)    Height:        Intake/Output Summary (Last 24 hours) at 12/27/15 0932 Last data filed at 12/27/15 0600  Gross per 24  hour  Intake           621.79 ml  Output              450 ml  Net           171.79 ml   Filed Weights   12/24/15 0437 12/25/15 0500 12/26/15 0500  Weight: 71.4 kg (157 lb 4.8 oz) 74.2 kg (163 lb 8 oz) 74.1 kg (163 lb 5.8 oz)    Examination:  General exam: NAD, Respiratory system: Clear to auscultation. Respiratory effort normal. Cardiovascular system: S1 & S2 heard, RRR. No JVD, murmurs, rubs, gallops or clicks. No pedal edema. Gastrointestinal system: Abdomen is   distended, soft and mild tender. No organomegaly or masses felt.  Decreased bowel sounds heard, dressing mid abdomen.  Central nervous system: alert and oriented LE; no edema.    Data Reviewed: I have personally reviewed following labs and imaging studies  CBC:  Recent Labs Lab 12/23/15 2034 12/24/15 0612 12/25/15 0358 12/26/15 0301 12/27/15 0529  WBC 9.2 11.6* 12.9* 13.0* 11.7*  NEUTROABS 7.8* 10.6*  --   --   --   HGB 16.6* 16.1* 14.6 13.8 13.5  HCT 46.7* 45.6 42.6 40.2 38.5  MCV 79.6 78.4 80.2 79.0 80.4  PLT 324 348 369 427* [id number]*   Basic Metabolic Panel:  Recent Labs Lab 12/23/15 2034 12/24/15 0612 12/24/15 1100 12/25/15 0358 12/26/15 0301 12/27/15 0529  NA 139 139  --  144 144 144  K 3.4* 3.4*  --  3.6 3.3* 3.9  CL 97* 98*  --  101 100* 103  CO2 29 24  --  29 30 26   GLUCOSE 129* 129*  --  109* 121* 109*  BUN 105* 106*  --  106* 112* 114*  CREATININE 2.72* 2.80*  --  2.51* 2.40* 2.15*  CALCIUM 8.4* 8.2*  --  8.5* 8.9 8.8*  MG  --   --  2.7*  --   --   --    GFR: Estimated Creatinine Clearance: 20 mL/min (by C-G formula based on SCr of 2.15 mg/dL (H)). Liver Function Tests: No results for input(s): AST, ALT, ALKPHOS, BILITOT, PROT, ALBUMIN in the last 168 hours. No results for input(s): LIPASE, AMYLASE in the last 168 hours. No results for input(s): AMMONIA in the last 168 hours. Coagulation Profile: No results for input(s): INR, PROTIME in the last 168 hours. Cardiac Enzymes:  Recent  Labs Lab 12/22/15 1855 12/23/15 0700  TROPONINI 0.99* 1.03*   BNP (last 3 results)  Recent Labs  11/09/15 1455  PROBNP 1,559.0*   HbA1C: No results for input(s): HGBA1C in the last 72 hours. CBG:  Recent Labs Lab 12/26/15 0750 12/26/15 1228 12/26/15 1744 12/26/15 2156 12/27/15 0755  GLUCAP 111* 125* 119* 107* 101*   Lipid Profile: No results for input(s): CHOL, HDL, LDLCALC, TRIG,  CHOLHDL, LDLDIRECT in the last 72 hours. Thyroid Function Tests: No results for input(s): TSH, T4TOTAL, FREET4, T3FREE, THYROIDAB in the last 72 hours. Anemia Panel: No results for input(s): VITAMINB12, FOLATE, FERRITIN, TIBC, IRON, RETICCTPCT in the last 72 hours. Urine analysis:    Component Value Date/Time   COLORURINE YELLOW 12/18/2015 2239   APPEARANCEUR CLEAR 12/18/2015 2239   LABSPEC 1.011 12/18/2015 2239   PHURINE 5.5 12/18/2015 2239   GLUCOSEU NEGATIVE 12/18/2015 2239   GLUCOSEU Color Interference (A) 08/15/2015 1245   HGBUR SMALL (A) 12/18/2015 2239   BILIRUBINUR NEGATIVE 12/18/2015 2239   KETONESUR NEGATIVE 12/18/2015 2239   PROTEINUR 100 (A) 12/18/2015 2239   UROBILINOGEN Color Interference (A) 08/15/2015 1245   NITRITE NEGATIVE 12/18/2015 2239   LEUKOCYTESUR NEGATIVE 12/18/2015 2239     ) Recent Results (from the past 240 hour(s))  MRSA PCR Screening     Status: None   Collection Time: 12/23/15  2:49 PM  Result Value Ref Range Status   MRSA by PCR NEGATIVE NEGATIVE Final    Comment:        The GeneXpert MRSA Assay (FDA approved for NASAL specimens only), is one component of a comprehensive MRSA colonization surveillance program. It is not intended to diagnose MRSA infection nor to guide or monitor treatment for MRSA infections.       Anti-infectives    Start     Dose/Rate Route Frequency Ordered Stop   12/23/15 1640  cefoTEtan in Dextrose 5% (CEFOTAN) 2-2.08 GM-% IVPB    Comments:  Jones, Tomika   : cabinet override      12/23/15 1640 12/24/15 0444    12/23/15 1300  cefoTEtan (CEFOTAN) 2 g in dextrose 5 % 50 mL IVPB     2 g 100 mL/hr over 30 Minutes Intravenous To [facility] 12/23/15 1245 12/23/15 1630   12/19/15 1400  metroNIDAZOLE (FLAGYL) IVPB 500 mg  Status:  Discontinued     500 mg 100 mL/hr over 60 Minutes Intravenous Every 8 hours 12/19/15 1257 12/23/15 1924   12/19/15 1400  ciprofloxacin (CIPRO) IVPB 400 mg  Status:  Discontinued     400 mg 200 mL/hr over 60 Minutes Intravenous Every 24 hours 12/19/15 1307 12/23/15 1924       Radiology Studies: No results found.      Scheduled Meds: . chlorhexidine  15 mL Mouth Rinse BID  . insulin aspart  0-9 Units Subcutaneous TID WC & HS  . mouth rinse  15 mL Mouth Rinse q12n4p   Continuous Infusions: . heparin 1,150 Units/hr (12/27/15 0600)     LOS: 7 days    Time spent: 25 min    Elmarie Shiley, MD [facility] Pager [phone]  If 7PM-7AM, please contact night-coverage [email or web address] Password [email or web address] 12/27/2015, 9:32 AM

## 2015-12-28 DIAGNOSIS — G4733 Obstructive sleep apnea (adult) (pediatric): Secondary | ICD-10-CM

## 2015-12-28 DIAGNOSIS — N179 Acute kidney failure, unspecified: Secondary | ICD-10-CM

## 2015-12-28 DIAGNOSIS — E669 Obesity, unspecified: Secondary | ICD-10-CM

## 2015-12-28 DIAGNOSIS — E1169 Type 2 diabetes mellitus with other specified complication: Secondary | ICD-10-CM

## 2015-12-28 LAB — APTT: APTT: 113 s — AB (ref 24–36)

## 2015-12-28 LAB — GLUCOSE, CAPILLARY
Glucose-Capillary: 107 mg/dL — ABNORMAL HIGH (ref 65–99)
Glucose-Capillary: 168 mg/dL — ABNORMAL HIGH (ref 65–99)
Glucose-Capillary: 129 mg/dL — ABNORMAL HIGH (ref 65–99)
Glucose-Capillary: 143 mg/dL — ABNORMAL HIGH (ref 65–99)

## 2015-12-28 LAB — CBC
HEMATOCRIT: 33.3 % — AB (ref 36.0–46.0)
HEMOGLOBIN: 11.4 g/dL — AB (ref 12.0–15.0)
MCH: 26.6 pg (ref 26.0–34.0)
MCHC: 34.2 g/dL (ref 30.0–36.0)
MCV: 77.8 fL — AB (ref 78.0–100.0)
PLATELETS: 373 10*3/uL (ref 150–400)
RBC: 4.28 MIL/uL (ref 3.87–5.11)
RDW: 14.8 % (ref 11.5–15.5)
WBC: 10.5 10*3/uL (ref 4.0–10.5)

## 2015-12-28 LAB — HEPARIN LEVEL (UNFRACTIONATED): Heparin Unfractionated: 0.55 IU/mL (ref 0.30–0.70)

## 2015-12-28 MED ORDER — FUROSEMIDE 20 MG PO TABS
20.0000 mg | ORAL_TABLET | Freq: Every day | ORAL | Status: DC
  Administered 2015-12-28 – 2016-01-01 (×5): 20 mg via ORAL
  Filled 2015-12-28 (×5): qty 1

## 2015-12-28 NOTE — Progress Notes (Signed)
5 Days Post-Op  Subjective: Slept well last night. Tolerated NG removal and clear liquids without nausea, belching or emesis. Continues to pass flatus, one bowel movement this morning. Walked yesterday.     Objective: Vital signs in last 24 hours: Temp:  [97.5 F (36.4 C)-98.3 F (36.8 C)] 98.3 F (36.8 C) (11/01 0453) Pulse Rate:  [97-107] 97 (11/01 0453) Resp:  [17-19] 17 (11/01 0453) BP: (107-114)/(69-79) 114/79 (11/01 0453) SpO2:  [100 %] 100 % (11/01 0453) Weight:  [74.2 kg (163 lb 8 oz)] 74.2 kg (163 lb 8 oz) (11/01 0453) Last BM Date: 12/27/15 PO 740 BM x 4 Afebrile, VSS Creatinine is a bit lower. WBC continues to downtrend.  Intake/Output from previous day: 10/31 0701 - 11/01 0700 In: 947 [P.O.:740; I.V.:207] Out: 1 [Stool:1] Intake/Output this shift: Total I/O In: 177.5 [P.O.:120; I.V.:57.5] Out: 1 [Stool:1]  General appearance: alert, cooperative and no distress Chest wall: no tenderness, Chest clear to ascultation  GI: soft, incision OK, few hypoactive BS, she reports multiple BM's pt nurse confirms BM's.  Lab Results:   Recent Labs  12/27/15 0529 12/28/15 0448  WBC 11.7* 10.5  HGB 13.5 11.4*  HCT 38.5 33.3*  PLT 450* 373    BMET  Recent Labs  12/26/15 0301 12/27/15 0529  NA 144 144  K 3.3* 3.9  CL 100* 103  CO2 30 26  GLUCOSE 121* 109*  BUN 112* 114*  CREATININE 2.40* 2.15*  CALCIUM 8.9 8.8*   PT/INR No results for input(s): LABPROT, INR in the last 72 hours.  No results for input(s): AST, ALT, ALKPHOS, BILITOT, PROT, ALBUMIN in the last 168 hours.   Lipase     Component Value Date/Time   LIPASE 48 12/18/2015 1900     Studies/Results: No results found.  Medications: . chlorhexidine  15 mL Mouth Rinse BID  . insulin aspart  0-9 Units Subcutaneous TID WC & HS  . isosorbide mononitrate  30 mg Oral Daily  . mouth rinse  15 mL Mouth Rinse q12n4p    Assessment/Plan Distal SBO involving the jejunum with necrosis, but no  perforation, intertwined with right Fallopian tube and ovary - only surgery status post partial hysterectomy 1972 [place], 12/23/15 Dr. Hulen Skains  POD4 History of benign neoplasm of pancreas, found with SBO 11/11/2001 History of atrial flutter;diastolic and systolic congestive heart failure(2-D echo 05/2814 with EF 50-55% and grade 2 diastolic dysfunction) Chronic kidney disease stage III - creatinine 2.35 AODM Kidney disease, acute on chronic History of obstructive sleep apnea Hypertension Hypercholesterolemia FEN: tolerated clears ID: day 5 Cipro/Flagyl completed 10/27, Cefotetan preop 12/23/15 DVT: Heparin drip and SCD's   Plan:  Advance to full liquid diet, continue to mobilize     LOS: 8 days    Clovis Riley MD 12/28/2015

## 2015-12-28 NOTE — Progress Notes (Signed)
ANTICOAGULATION CONSULT NOTE - Follow Up Consult  Pharmacy Consult:  Heparin Indication: atrial fibrillation  Allergies  Allergen Reactions  . Piroxicam Other (See Comments)    REACTION: HEADACHE    Patient Measurements: Height: 5\' 5"  (165.1 cm) Weight: 163 lb 8 oz (74.2 kg) IBW/kg (Calculated) : 57 Heparin Dosing Weight: 71 kg  Vital Signs: Temp: 98.3 F (36.8 C) (11/01 0453) Temp Source: Oral (11/01 0453) BP: 114/79 (11/01 0453) Pulse Rate: 97 (11/01 0453)  Labs:  Recent Labs  12/26/15 0301 12/27/15 0529 12/28/15 0448  HGB 13.8 13.5 11.4*  HCT 40.2 38.5 33.3*  PLT 427* 450* 373  APTT 81* 94* 113*  HEPARINUNFRC 0.87* 0.71* 0.55  CREATININE 2.40* 2.15*  --     Estimated Creatinine Clearance: 20 mL/min (by C-G formula based on SCr of 2.15 mg/dL (H)).   Assessment: Wendy Kerr with history of Afib on Eliquis PTA.  Patient was transitioned to IV heparin for surgery.  Now s/p ex-lap with SBR on 12/23/15. Diet to adv from clear to full liquids.  Heparin level therapeutic at 0.55, aPTT slightly above goal at 113 sec.  Heparin levels are now correlating with aPTTs.  Hg 13.5>11.4, pltc WNL.   No bleeding reported.   Goal of Therapy:  Heparin level 0.3-0.7 units/ml aPTT 66-102 seconds Monitor platelets by anticoagulation protocol: Yes    Plan:  - decrease heparin gtt to 1100 units/hr - daily heparin level, CBC (stop aPTTs) - F/U with diet advancement to resume Eliquis  Eudelia Bunch, Pharm.D. BP:[id number] 12/28/2015 10:25 AM

## 2015-12-28 NOTE — Progress Notes (Signed)
Physical Therapy Treatment Patient Details Name: Wendy Kerr MRN: [id number] DOB: [birth year] Today's Date: 12/28/2015    History of Present Illness Wendy Sandberg Johnsonis a 82 y.o.femalewith medical history significant of CHF with EF 40-45%, CKD.  She was admitted due to severe abdominal pain, N/V.  Has had 50# weight loss in last few months.  Found to have enteritis.  Patient now s/p exploratory lap for SBO with segment resected on 12/23/15.    PT Comments    Pt admitted with above diagnosis. Pt currently with functional limitations due to balance and endurance deficits. Pt able to ambulate with rW with min guard assist for safety.  Gait better each day.   Pt will benefit from skilled PT to increase their independence and safety with mobility to allow discharge to the venue listed below.    Follow Up Recommendations  Home health PT;Supervision for mobility/OOB     Equipment Recommendations  Rolling walker with 5" wheels;3in1 (PT)    Recommendations for Other Services       Precautions / Restrictions Precautions Precautions: Fall Restrictions Weight Bearing Restrictions: No    Mobility  Bed Mobility                  Transfers Overall transfer level: Needs assistance Equipment used: Rolling walker (2 wheeled) Transfers: Sit to/from Stand Sit to Stand: Min guard Stand pivot transfers: Min guard       General transfer comment: cues for hand placement; min guard for safety from recliner and BSC  Ambulation/Gait Ambulation/Gait assistance: Min guard Ambulation Distance (Feet): 260 Feet Assistive device: Rolling walker (2 wheeled) Gait Pattern/deviations: Step-through pattern;Decreased stride length;Trunk flexed Gait velocity: decreased Gait velocity interpretation: Below normal speed for age/gender General Gait Details: cues for posture; slow, steady gait.  Took several standing rest breaks.  Was fatigued at end of walk.     Stairs             Wheelchair Mobility    Modified Rankin (Stroke Patients Only)       Balance Overall balance assessment: Needs assistance         Standing balance support: Bilateral upper extremity supported;During functional activity Standing balance-Leahy Scale: Poor Standing balance comment: UE support for balance in stance.                      Cognition Arousal/Alertness: Awake/alert Behavior During Therapy: WFL for tasks assessed/performed;Flat affect Overall Cognitive Status: Within Functional Limits for tasks assessed                      Exercises      General Comments General comments (skin integrity, edema, etc.): Pt also transferred to 3N1 and back and was able to wipe herself.       Pertinent Vitals/Pain Pain Assessment: No/denies pain  VSS    Home Living                      Prior Function            PT Goals (current goals can now be found in the care plan section) Acute Rehab PT Goals Patient Stated Goal: To return to independent Progress towards PT goals: Progressing toward goals    Frequency    Min 3X/week      PT Plan Current plan remains appropriate    Co-evaluation             End of Session Equipment  Utilized During Treatment: Gait belt Activity Tolerance: Patient tolerated treatment well Patient left: in chair;with call bell/phone within reach     Time: 1205-1217 PT Time Calculation (min) (ACUTE ONLY): 12 min  Charges:  $Gait Training: 8-22 mins                    G Codes:      [place] Pick Jan 10, 2016, 3:17 PM  [employer] Acute Rehabilitation [phone] (pager)

## 2015-12-28 NOTE — Progress Notes (Signed)
PROGRESS NOTE    Wendy Kerr  [id number] DOB: Aug 24, 1930 DOA: 12/18/2015 PCP: Hoyt Koch, MD   Outpatient Specialists:     Brief Narrative:  Wendy Kerr is a 82 y.o. female with medical history significant of CHF with EF 40-45%, CKD.  Patient presents to the ED with c/o abdominal pain, N/V.  Abdominal pain is throughout the abdomen and especially in lower abdomen.  Symptoms are severe.  No fever, chills, diarrhea, dysuria.  No blood in vomit or BM.  50lb weight loss over last couple of months.  Recent admit and treatment for CHF.  she ate fried chicken from a gas station and symptoms onset shortly there after.  Nothing makes symptoms better or worse.  CT abd pelvis suggestive of enteritis, no contrast used due to kidney function, lactate trending down and is now WNL on recheck, pain control achieved with IV dilaudid.  Creatinine is 2.5 (essentially unchanged from recent labs).   Patient admitted 10-22 with abdominal pain, nausea, vomiting. nitially patient was thought to have enteritis. She continue to vomit. Repeat KUB with dilation Small bowel. Surgery consulted. Patient had NG tube placed yielding 3 L of fluid from stomach. Patient underwent exploratory laparotomy 10-27 found to have  Distal SBO involving the jejunum with necrosis, but no perforation, intertwined with right Fallopian tube and ovary.  Segment 1 foot long that was resected.   Assessment & Plan:   Principal Problem:   Enteritis presumed infectious Active Problems:   Diabetes mellitus type 2 in obese [facility])   Essential hypertension   Chronic combined systolic and diastolic heart failure (HCC)   OSA (obstructive sleep apnea)   CKD stage 3 due to type 2 diabetes mellitus (HCC)   AKI (acute kidney injury) (HCC)   Nausea   Poisoning by digoxin   Preoperative cardiovascular examination   SBO (small bowel obstruction)  1- SBO;  Distal SBO involving the jejunum with necrosis, but no perforation,  intertwined with right Fallopian tube and ovary.  -Initially patient was thought to have enteritis. She continue to vomit. Repeat KUB with dilatation of Small bowel.  -Surgery consulted. Patient had NG tube placed yielding 3 L of fluid from stomach.  -Patient underwent exploratory laparotomy 10-27 found to have  Distal SBO involving the jejunum with necrosis, but no perforation, intertwined with right Fallopian tube and ovary.  Segment 1 foot long that was resected. -Management per sx .  -slowly advancing diet -antibiotics discontinued after surgery completed.  DM2 - appears to be diet controlled at home -SSI while inpatient   HTN -  -continue Imdur and PRN hydralazine.  -will resume low dose lasix today   Leukocytosis; follow trend.  -most likely stress demargination -no signs of acute infection currently -antibiotics therapy completed and discontinued after surgery -WBC's 10.5 today (11/01)   Cronic diastolic CHF - -Continue Imdur.  -Continue to monitor on telemetry.  -Cardiology consulted, pre op evaluation and management.  -NSL fluids now -follow daily weights  -will resume low dose lasix today  AKI on CKD stage III due to type 2 DM.   -due to dehydration most likely -after IVF's given Cr back to baseline -Cr 2.15 today -will monitor trend closely  A fib; was on eliquis at home.  -On IV heparin --- change to eliquis when ok by surgical team.  -continue monitoring on telemetry.  -will hold cardizem for now -CHADsVASC score 5.   Hypokalemia; resolved. -will monitor and replete as needed    DVT  prophylaxis:  SCD's  Code Status: Full Code   Family Communication: Patient   Disposition Plan:  To be determine, awaiting improvement of bowel function.    Consultants:   Surgery  Cardiology    Subjective: She is feeling much better, reported being able to tolerate CLD and had another small BM overnight. no fever, no CP and no SOB. Minimal abd pain reported.      Objective: Vitals:   12/27/15 1503 12/27/15 2121 12/28/15 0453 12/28/15 1229  BP: 107/69 110/71 114/79 (!) 108/57  Pulse: (!) 107 (!) 106 97 (!) 105  Resp: 19 17 17 16   Temp: 97.8 F (36.6 C) 97.5 F (36.4 C) 98.3 F (36.8 C) 97.7 F (36.5 C)  TempSrc: Oral Oral Oral Oral  SpO2: 100% 100% 100% 99%  Weight:   74.2 kg (163 lb 8 oz)   Height:        Intake/Output Summary (Last 24 hours) at 12/28/15 1710 Last data filed at 12/28/15 1500  Gross per 24 hour  Intake           857.32 ml  Output                1 ml  Net           856.32 ml   Filed Weights   12/25/15 0500 12/26/15 0500 12/28/15 0453  Weight: 74.2 kg (163 lb 8 oz) 74.1 kg (163 lb 5.8 oz) 74.2 kg (163 lb 8 oz)    Examination:  General exam: NAD, afebrile and tolerating CLD. Reports passing gas and small BM overnight. Respiratory system: Clear to auscultation. Respiratory effort normal. Cardiovascular system: S1 & S2 heard, RRR. No JVD, murmurs, rubs, gallops or clicks. No pedal edema. Gastrointestinal system: Abdomen is slightly distended, soft and with minimal tenderness. No organomegaly or masses felt.  Decreased, but present bowel sounds heard, dressing mid abdomen clean and intact.  Central nervous system: alert and oriented, no focal deficit  Extremities: trace edema bilaterally edema.    Data Reviewed: I have personally reviewed following labs and imaging studies  CBC:  Recent Labs Lab 12/23/15 2034 12/24/15 0612 12/25/15 0358 12/26/15 0301 12/27/15 0529 12/28/15 0448  WBC 9.2 11.6* 12.9* 13.0* 11.7* 10.5  NEUTROABS 7.8* 10.6*  --   --   --   --   HGB 16.6* 16.1* 14.6 13.8 13.5 11.4*  HCT 46.7* 45.6 42.6 40.2 38.5 33.3*  MCV 79.6 78.4 80.2 79.0 80.4 77.8*  PLT 324 348 369 427* 450* [id number]   Basic Metabolic Panel:  Recent Labs Lab 12/23/15 2034 12/24/15 0612 12/24/15 1100 12/25/15 0358 12/26/15 0301 12/27/15 0529  NA 139 139  --  144 144 144  K 3.4* 3.4*  --  3.6 3.3* 3.9  CL 97* 98*   --  101 100* 103  CO2 29 24  --  29 30 26   GLUCOSE 129* 129*  --  109* 121* 109*  BUN 105* 106*  --  106* 112* 114*  CREATININE 2.72* 2.80*  --  2.51* 2.40* 2.15*  CALCIUM 8.4* 8.2*  --  8.5* 8.9 8.8*  MG  --   --  2.7*  --   --   --    GFR: Estimated Creatinine Clearance: 20 mL/min (by C-G formula based on SCr of 2.15 mg/dL (H)).  Cardiac Enzymes:  Recent Labs Lab 12/22/15 1855 12/23/15 0700  TROPONINI 0.99* 1.03*   BNP (last 3 results)  Recent Labs  11/09/15 1455  PROBNP 1,559.0*  CBG:  Recent Labs Lab 12/27/15 1213 12/27/15 1736 12/27/15 2123 12/28/15 0751 12/28/15 1217  GLUCAP 165* 135* 113* 107* 168*   Urine analysis:    Component Value Date/Time   COLORURINE YELLOW 12/18/2015 2239   APPEARANCEUR CLEAR 12/18/2015 2239   LABSPEC 1.011 12/18/2015 2239   PHURINE 5.5 12/18/2015 2239   GLUCOSEU NEGATIVE 12/18/2015 2239   GLUCOSEU Color Interference (A) 08/15/2015 1245   HGBUR SMALL (A) 12/18/2015 2239   BILIRUBINUR NEGATIVE 12/18/2015 2239   KETONESUR NEGATIVE 12/18/2015 2239   PROTEINUR 100 (A) 12/18/2015 2239   UROBILINOGEN Color Interference (A) 08/15/2015 1245   NITRITE NEGATIVE 12/18/2015 2239   LEUKOCYTESUR NEGATIVE 12/18/2015 2239     ) Recent Results (from the past 240 hour(s))  MRSA PCR Screening     Status: None   Collection Time: 12/23/15  2:49 PM  Result Value Ref Range Status   MRSA by PCR NEGATIVE NEGATIVE Final    Comment:        The GeneXpert MRSA Assay (FDA approved for NASAL specimens only), is one component of a comprehensive MRSA colonization surveillance program. It is not intended to diagnose MRSA infection nor to guide or monitor treatment for MRSA infections.       Anti-infectives    Start     Dose/Rate Route Frequency Ordered Stop   12/23/15 1640  cefoTEtan in Dextrose 5% (CEFOTAN) 2-2.08 GM-% IVPB    Comments:  Jones, Tomika   : cabinet override      12/23/15 1640 12/24/15 0444   12/23/15 1300  cefoTEtan  (CEFOTAN) 2 g in dextrose 5 % 50 mL IVPB     2 g 100 mL/hr over 30 Minutes Intravenous To ShortStay Surgical 12/23/15 1245 12/23/15 1630   12/19/15 1400  metroNIDAZOLE (FLAGYL) IVPB 500 mg  Status:  Discontinued     500 mg 100 mL/hr over 60 Minutes Intravenous Every 8 hours 12/19/15 1257 12/23/15 1924   12/19/15 1400  ciprofloxacin (CIPRO) IVPB 400 mg  Status:  Discontinued     400 mg 200 mL/hr over 60 Minutes Intravenous Every 24 hours 12/19/15 1307 12/23/15 1924      Radiology Studies: No results found.    Scheduled Meds: . chlorhexidine  15 mL Mouth Rinse BID  . insulin aspart  0-9 Units Subcutaneous TID WC & HS  . isosorbide mononitrate  30 mg Oral Daily  . mouth rinse  15 mL Mouth Rinse q12n4p   Continuous Infusions: . heparin 1,100 Units/hr (12/28/15 1038)     LOS: 8 days    Time spent: 25 min    Barton Dubois, MD [facility] Pager [phone]  If 7PM-7AM, please contact night-coverage [email or web address] Password [email or web address] 12/28/2015, 5:10 PM

## 2015-12-29 DIAGNOSIS — I482 Chronic atrial fibrillation: Secondary | ICD-10-CM

## 2015-12-29 LAB — CBC
HEMATOCRIT: 30.4 % — AB (ref 36.0–46.0)
Hemoglobin: 10.5 g/dL — ABNORMAL LOW (ref 12.0–15.0)
MCH: 26.7 pg (ref 26.0–34.0)
MCHC: 34.5 g/dL (ref 30.0–36.0)
MCV: 77.4 fL — AB (ref 78.0–100.0)
PLATELETS: 375 10*3/uL (ref 150–400)
RBC: 3.93 MIL/uL (ref 3.87–5.11)
RDW: 14.6 % (ref 11.5–15.5)
WBC: 12.4 10*3/uL — AB (ref 4.0–10.5)

## 2015-12-29 LAB — GLUCOSE, CAPILLARY
GLUCOSE-CAPILLARY: 140 mg/dL — AB (ref 65–99)
GLUCOSE-CAPILLARY: 123 mg/dL — AB (ref 65–99)
GLUCOSE-CAPILLARY: 129 mg/dL — AB (ref 65–99)
Glucose-Capillary: 109 mg/dL — ABNORMAL HIGH (ref 65–99)

## 2015-12-29 LAB — HEPARIN LEVEL (UNFRACTIONATED): Heparin Unfractionated: 0.39 IU/mL (ref 0.30–0.70)

## 2015-12-29 NOTE — Progress Notes (Signed)
6 Days Post-Op  Subjective: She seems to be doing well this AM, tolerating PO, full liquids well. Having some bowel movements. Reports ambulating twice yesterday once with physical therapy and once with the nurse. Abdominal midline incision looks good.  Objective: Vital signs in last 24 hours: Temp:  [97.7 F (36.5 C)-98.7 F (37.1 C)] 98.1 F (36.7 C) (11/02 0527) Pulse Rate:  [80-105] 105 (11/02 0527) Resp:  [16] 16 (11/02 0527) BP: (103-108)/(57-67) 103/60 (11/02 0527) SpO2:  [98 %-100 %] 98 % (11/02 0527) Weight:  [77.9 kg (171 lb 12.8 oz)] 77.9 kg (171 lb 12.8 oz) (11/02 0500) Last BM Date: 12/28/15 Afebrile, VSS WBC 12.4 No films Pathology: Small intestine, resection, jejunum - BENIGN SMALL BOWEL WITH ABSCESS, NECROSIS, AND SEROSITIS. - NO DYSPLASIA OR MALIGNANCY 480 PO recorded voided x 4 Urine x 4 recorded Intake/Output from previous day: 11/01 0701 - 11/02 0700 In: 760.3 [P.O.:480; I.V.:280.3] Out: -  Intake/Output this shift: Total I/O In: 69.5 [I.V.:69.5] Out: -   General appearance: alert, cooperative and no distress Resp: clear to auscultation bilaterally GI: Soft, still sore, positive bowel sounds, positive BM midline incision looks good.  Lab Results:   Recent Labs  12/28/15 0448 12/29/15 0532  WBC 10.5 12.4*  HGB 11.4* 10.5*  HCT 33.3* 30.4*  PLT 373 375    BMET  Recent Labs  12/27/15 0529  NA 144  K 3.9  CL 103  CO2 26  GLUCOSE 109*  BUN 114*  CREATININE 2.15*  CALCIUM 8.8*   PT/INR No results for input(s): LABPROT, INR in the last 72 hours.  No results for input(s): AST, ALT, ALKPHOS, BILITOT, PROT, ALBUMIN in the last 168 hours.   Lipase     Component Value Date/Time   LIPASE 48 12/18/2015 1900     Studies/Results: No results found.  Medications: . chlorhexidine  15 mL Mouth Rinse BID  . furosemide  20 mg Oral Daily  . insulin aspart  0-9 Units Subcutaneous TID WC & HS  . isosorbide mononitrate  30 mg Oral Daily  .  mouth rinse  15 mL Mouth Rinse q12n4p   . heparin 1,100 Units/hr (12/29/15 0009)   Assessment/Plan Distal SBO involving the jejunum with necrosis, but no perforation, intertwined with right Fallopian tube and ovary- only surgery status post partial hysterectomy 1972 [place] OBSTRUCTION,12/23/15 Dr. Steward Ros 6 History of benign neoplasm of pancreas, found with SBO 11/11/2001 History of atrial flutter;diastolic and systolic congestive heart failure(2-D echo 05/2814 with EF 50-55% and grade 2 diastolic dysfunction) Chronic kidney disease stage III  AODM Kidney disease, acute on chronic History of obstructive sleep apnea Hypertension Hypercholesterolemia FEN: Full liquids ID: day 5 Cipro/Flagyl completed 10/27, Cefotetan preop 12/23/15 DVT: Heparin drip and SCD's     Plan: Cardiac diet, she does well today we can begin transition to oral anticoagulation and discharge planning can be initiated.    LOS: 9 days    Jatinder Mcdonagh 12/29/2015 [phone]

## 2015-12-29 NOTE — Progress Notes (Signed)
ANTICOAGULATION CONSULT NOTE - Follow Up Consult  Pharmacy Consult:  Heparin Indication: atrial fibrillation  Allergies  Allergen Reactions  . Piroxicam Other (See Comments)    REACTION: HEADACHE    Patient Measurements: Height: 5\' 5"  (165.1 cm) Weight: 171 lb 12.8 oz (77.9 kg) IBW/kg (Calculated) : 57 Heparin Dosing Weight: 71 kg  Vital Signs: Temp: 98.1 F (36.7 C) (11/02 0527) Temp Source: Oral (11/02 0527) BP: 103/60 (11/02 0527) Pulse Rate: 105 (11/02 0527)  Labs:  Recent Labs  12/27/15 0529 12/28/15 0448 12/29/15 0532  HGB 13.5 11.4* 10.5*  HCT 38.5 33.3* 30.4*  PLT 450* 373 375  APTT 94* 113*  --   HEPARINUNFRC 0.71* 0.55 0.39  CREATININE 2.15*  --   --     Estimated Creatinine Clearance: 20.5 mL/min (by C-G formula based on SCr of 2.15 mg/dL (H)).   Assessment: 68 YOF with history of Afib on Eliquis PTA.  Patient was transitioned to IV heparin for surgery.  Now s/p ex-lap with SBR on 12/23/15. Diet adv from clear to full liquids.  Heparin level therapeutic at 0.39.   Hg 13.5>11.4>10.5, pltc WNL.   No bleeding reported.   Goal of Therapy:  Heparin level 0.3-0.7 units/ml aPTT 66-102 seconds Monitor platelets by anticoagulation protocol: Yes   Plan:  - continue heparin gtt at 1100 units/hr - daily heparin level, CBC - F/U with diet advancement to resume Eliquis - possibly tomorrow  Eudelia Bunch, Pharm.D. BP:[phone] 12/29/2015 11:46 AM

## 2015-12-29 NOTE — Progress Notes (Signed)
PROGRESS NOTE    Wendy Kerr  [id number] DOB: [birth year] DOA: 12/18/2015 PCP: Hoyt Koch, MD   Outpatient Specialists:     Brief Narrative:  Wendy Kerr is a 82 y.o. female with medical history significant of CHF with EF 40-45%, CKD.  Patient presents to the ED with c/o abdominal pain, N/V.  Abdominal pain is throughout the abdomen and especially in lower abdomen.  Symptoms are severe.  No fever, chills, diarrhea, dysuria.  No blood in vomit or BM.  50lb weight loss over last couple of months.  Recent admit and treatment for CHF.  she ate fried chicken from a gas station and symptoms onset shortly there after.  Nothing makes symptoms better or worse.  CT abd pelvis suggestive of enteritis, no contrast used due to kidney function, lactate trending down and is now WNL on recheck, pain control achieved with IV dilaudid.  Creatinine is 2.5 (essentially unchanged from recent labs).   Patient admitted 10-22 with abdominal pain, nausea, vomiting. nitially patient was thought to have enteritis. She continue to vomit. Repeat KUB with dilation Small bowel. Surgery consulted. Patient had NG tube placed yielding 3 L of fluid from stomach. Patient underwent exploratory laparotomy 10-27 found to have  Distal SBO involving the jejunum with necrosis, but no perforation, intertwined with right Fallopian tube and ovary.  Segment 1 foot long that was resected.   Assessment & Plan:   Principal Problem:   Enteritis presumed infectious Active Problems:   Diabetes mellitus type 2 in obese [facility])   Essential hypertension   Chronic combined systolic and diastolic heart failure (HCC)   OSA (obstructive sleep apnea)   CKD stage 3 due to type 2 diabetes mellitus (HCC)   AKI (acute kidney injury) (HCC)   Nausea   Poisoning by digoxin   Preoperative cardiovascular examination   SBO (small bowel obstruction)  1- SBO;  Distal SBO involving the jejunum with necrosis, but no perforation,  intertwined with right Fallopian tube and ovary.  -Initially patient was thought to have enteritis. She continue to vomit. Repeat KUB with dilatation of Small bowel.  -Surgery consulted. Patient had NG tube placed yielding 3 L of fluid from stomach.  -Patient underwent exploratory laparotomy 10-27 found to have  Distal SBO involving the jejunum with necrosis, but no perforation, intertwined with right Fallopian tube and ovary.  Segment 1 foot long that was resected. -Management per sx .  -slowly advancing diet -antibiotics discontinued after surgery completed.  DM2 - appears to be diet controlled at home -SSI while inpatient   HTN -  -continue Imdur and PRN hydralazine.  -will resume low dose lasix today   Leukocytosis; follow trend.  -most likely stress demargination -no signs of acute infection currently -antibiotics therapy completed and discontinued after surgery  Cronic diastolic CHF - -Continue Imdur.  -Continue to monitor on telemetry.  -Cardiology consulted, pre op evaluation and management.  -NSL fluids now -follow daily weights  -will continue low dose lasix today  AKI on CKD stage III due to type 2 DM.   -due to dehydration most likely -after IVF's given Cr back to baseline -will repeat BMET in am -will monitor trend closely  A fib; was on eliquis at home.  -On IV heparin -- -per discussion with surgery will switch to eliquis in am -continue monitoring on telemetry.  -will continue holding cardizem for now (BP is soft); rate controlled. -CHADsVASC score 5.   Hypokalemia; resolved. -will monitor and replete as needed  DVT prophylaxis:  SCD's  Code Status: Full Code   Family Communication: Patient   Disposition Plan:  To be determine, awaiting improvement of bowel function.    Consultants:   Surgery  Cardiology    Subjective: No fever, no CP and no SOB. Minimal abd pain reported.  Patient tolerating diet advancement and is in no  distress.  Objective: Vitals:   12/28/15 2100 12/29/15 0500 12/29/15 0527 12/29/15 1433  BP: 106/67  103/60 110/61  Pulse: 80  (!) 105 84  Resp: 16  16 18   Temp: 98.7 F (37.1 C)  98.1 F (36.7 C) 98.3 F (36.8 C)  TempSrc: Oral  Oral Oral  SpO2: 100%  98% 100%  Weight:  77.9 kg (171 lb 12.8 oz)    Height:        Intake/Output Summary (Last 24 hours) at 12/29/15 1541 Last data filed at 12/29/15 1435  Gross per 24 hour  Intake           759.48 ml  Output                0 ml  Net           759.48 ml   Filed Weights   12/26/15 0500 12/28/15 0453 12/29/15 0500  Weight: 74.1 kg (163 lb 5.8 oz) 74.2 kg (163 lb 8 oz) 77.9 kg (171 lb 12.8 oz)    Examination:  General exam: NAD, afebrile and tolerating CLD. Reports passing gas and having small BM. No acute complaints. Respiratory system: Clear to auscultation. Respiratory effort normal. Cardiovascular system: S1 & S2 heard, RRR. No JVD, murmurs, rubs, gallops or clicks. No pedal edema. Gastrointestinal system: Abdomen is slightly distended, soft and with minimal tenderness. No organomegaly or masses felt.  Decreased, but present bowel sounds heard, dressing mid abdomen clean and intact.  Central nervous system: alert and oriented, no focal deficit  Extremities: trace edema bilaterally edema.    Data Reviewed: I have personally reviewed following labs and imaging studies  CBC:  Recent Labs Lab 12/23/15 2034 12/24/15 0612 12/25/15 0358 12/26/15 0301 12/27/15 0529 12/28/15 0448 12/29/15 0532  WBC 9.2 11.6* 12.9* 13.0* 11.7* 10.5 12.4*  NEUTROABS 7.8* 10.6*  --   --   --   --   --   HGB 16.6* 16.1* 14.6 13.8 13.5 11.4* 10.5*  HCT 46.7* 45.6 42.6 40.2 38.5 33.3* 30.4*  MCV 79.6 78.4 80.2 79.0 80.4 77.8* 77.4*  PLT 324 348 369 427* 450* 373 [id number]   Basic Metabolic Panel:  Recent Labs Lab 12/23/15 2034 12/24/15 0612 12/24/15 1100 12/25/15 0358 12/26/15 0301 12/27/15 0529  NA 139 139  --  144 144 144  K 3.4* 3.4*   --  3.6 3.3* 3.9  CL 97* 98*  --  101 100* 103  CO2 29 24  --  29 30 26   GLUCOSE 129* 129*  --  109* 121* 109*  BUN 105* 106*  --  106* 112* 114*  CREATININE 2.72* 2.80*  --  2.51* 2.40* 2.15*  CALCIUM 8.4* 8.2*  --  8.5* 8.9 8.8*  MG  --   --  2.7*  --   --   --    GFR: Estimated Creatinine Clearance: 20.5 mL/min (by C-G formula based on SCr of 2.15 mg/dL (H)).  Cardiac Enzymes:  Recent Labs Lab 12/22/15 1855 12/23/15 0700  TROPONINI 0.99* 1.03*   BNP (last 3 results)  Recent Labs  11/09/15 1455  PROBNP 1,559.0*   CBG:  Recent Labs Lab 12/28/15 1217 12/28/15 1700 12/28/15 2129 12/29/15 0743 12/29/15 1218  GLUCAP 168* 129* 143* 109* 140*   Urine analysis:    Component Value Date/Time   COLORURINE YELLOW 12/18/2015 2239   APPEARANCEUR CLEAR 12/18/2015 2239   LABSPEC 1.011 12/18/2015 2239   PHURINE 5.5 12/18/2015 2239   GLUCOSEU NEGATIVE 12/18/2015 2239   GLUCOSEU Color Interference (A) 08/15/2015 1245   HGBUR SMALL (A) 12/18/2015 2239   BILIRUBINUR NEGATIVE 12/18/2015 2239   KETONESUR NEGATIVE 12/18/2015 2239   PROTEINUR 100 (A) 12/18/2015 2239   UROBILINOGEN Color Interference (A) 08/15/2015 1245   NITRITE NEGATIVE 12/18/2015 2239   LEUKOCYTESUR NEGATIVE 12/18/2015 2239     ) Recent Results (from the past 240 hour(s))  MRSA PCR Screening     Status: None   Collection Time: 12/23/15  2:49 PM  Result Value Ref Range Status   MRSA by PCR NEGATIVE NEGATIVE Final    Comment:        The GeneXpert MRSA Assay (FDA approved for NASAL specimens only), is one component of a comprehensive MRSA colonization surveillance program. It is not intended to diagnose MRSA infection nor to guide or monitor treatment for MRSA infections.       Anti-infectives    Start     Dose/Rate Route Frequency Ordered Stop   12/23/15 1640  cefoTEtan in Dextrose 5% (CEFOTAN) 2-2.08 GM-% IVPB    Comments:  Jones, Tomika   : cabinet override      12/23/15 1640 12/24/15 0444    12/23/15 1300  cefoTEtan (CEFOTAN) 2 g in dextrose 5 % 50 mL IVPB     2 g 100 mL/hr over 30 Minutes Intravenous To ShortStay Surgical 12/23/15 1245 12/23/15 1630   12/19/15 1400  metroNIDAZOLE (FLAGYL) IVPB 500 mg  Status:  Discontinued     500 mg 100 mL/hr over 60 Minutes Intravenous Every 8 hours 12/19/15 1257 12/23/15 1924   12/19/15 1400  ciprofloxacin (CIPRO) IVPB 400 mg  Status:  Discontinued     400 mg 200 mL/hr over 60 Minutes Intravenous Every 24 hours 12/19/15 1307 12/23/15 1924      Radiology Studies: No results found.    Scheduled Meds: . chlorhexidine  15 mL Mouth Rinse BID  . furosemide  20 mg Oral Daily  . insulin aspart  0-9 Units Subcutaneous TID WC & HS  . isosorbide mononitrate  30 mg Oral Daily  . mouth rinse  15 mL Mouth Rinse q12n4p   Continuous Infusions: . heparin 1,100 Units/hr (12/29/15 0719)     LOS: 9 days    Time spent: 25 min    Barton Dubois, MD [facility] Pager [phone]  If 7PM-7AM, please contact night-coverage [email or web address] Password [email or web address] 12/29/2015, 3:41 PM

## 2015-12-29 NOTE — Discharge Instructions (Signed)
[facility]      [facility] [phone]  OPEN ABDOMINAL SURGERY: POST OP INSTRUCTIONS  Always review your discharge instruction sheet given to you by the facility where your surgery was performed.  IF YOU HAVE DISABILITY OR FAMILY LEAVE FORMS, YOU MUST BRING THEM TO THE OFFICE FOR PROCESSING.  PLEASE DO NOT GIVE THEM TO YOUR DOCTOR.  1. A prescription for pain medication may be given to you upon discharge.  Take your pain medication as prescribed, if needed.  If narcotic pain medicine is not needed, then you may take acetaminophen (Tylenol) or ibuprofen (Advil) as needed. 2. Take your usually prescribed medications unless otherwise directed. 3. If you need a refill on your pain medication, please contact your pharmacy. They will contact our office to request authorization.  Prescriptions will not be filled after 5pm or on week-ends. 4. You should follow a light diet the first few days after arrival home, such as soup and crackers, pudding, etc.unless your doctor has advised otherwise. A high-fiber, low fat diet can be resumed as tolerated.   Be sure to include lots of fluids daily. Most patients will experience some swelling and bruising on the chest and neck area.  Ice packs will help.  Swelling and bruising can take several days to resolve 5. Most patients will experience some swelling and bruising in the area of the incision. Ice pack will help. Swelling and bruising can take several days to resolve..  6. It is common to experience some constipation if taking pain medication after surgery.  Increasing fluid intake and taking a stool softener will usually help or prevent this problem from occurring.  A mild laxative (Milk of Magnesia or Miralax) should be taken according to package directions if there are no bowel movements after 48 hours. 7.  You may have steri-strips (small skin tapes) in place directly over the incision.  These strips should be left on the skin for 7-10 days.  If your  surgeon used skin glue on the incision, you may shower in 24 hours.  The glue will flake off over the next 2-3 weeks.  Any sutures or staples will be removed at the office during your follow-up visit. You may find that a light gauze bandage over your incision may keep your staples from being rubbed or pulled. You may shower and replace the bandage daily. 8. ACTIVITIES:  You may resume regular (light) daily activities beginning the next day--such as daily self-care, walking, climbing stairs--gradually increasing activities as tolerated.  You may have sexual intercourse when it is comfortable.  Refrain from any heavy lifting or straining until approved by your doctor. a. You may drive when you no longer are taking prescription pain medication, you can comfortably wear a seatbelt, and you can safely maneuver your car and apply brakes b. Return to Work: ___________________________________ 9. You should see your doctor in the office for a follow-up appointment approximately two weeks after your surgery.  Make sure that you call for this appointment within a day or two after you arrive home to insure a convenient appointment time. OTHER INSTRUCTIONS:  _____________________________________________________________ _____________________________________________________________  WHEN TO CALL YOUR DOCTOR: 1. Fever over 101.0 2. Inability to urinate 3. Nausea and/or vomiting 4. Extreme swelling or bruising 5. Continued bleeding from incision. 6. Increased pain, redness, or drainage from the incision. 7. Difficulty swallowing or breathing 8. Muscle cramping or spasms. 9. Numbness or tingling in hands or feet or around lips.  The clinic staff is available to   answer your questions during regular business hours.  Please don't hesitate to call and ask to speak to one of the nurses if you have concerns.  For further questions, please visit [email or web address]   

## 2015-12-30 DIAGNOSIS — K921 Melena: Secondary | ICD-10-CM

## 2015-12-30 LAB — BASIC METABOLIC PANEL
ANION GAP: 10 (ref 5–15)
BUN: 91 mg/dL — AB (ref 6–20)
CALCIUM: 8.3 mg/dL — AB (ref 8.9–10.3)
CO2: 26 mmol/L (ref 22–32)
CREATININE: 1.88 mg/dL — AB (ref 0.44–1.00)
Chloride: 96 mmol/L — ABNORMAL LOW (ref 101–111)
GFR calc Af Amer: 27 mL/min — ABNORMAL LOW (ref >60)
GFR, EST NON AFRICAN AMERICAN: 24 mL/min — AB (ref >60)
GLUCOSE: 94 mg/dL (ref 65–99)
Potassium: 3.3 mmol/L — ABNORMAL LOW (ref 3.5–5.1)
Sodium: 132 mmol/L — ABNORMAL LOW (ref 135–145)

## 2015-12-30 LAB — CBC
HCT: 28.1 % — ABNORMAL LOW (ref 36.0–46.0)
HEMOGLOBIN: 9.9 g/dL — AB (ref 12.0–15.0)
MCH: 27 pg (ref 26.0–34.0)
MCHC: 35.2 g/dL (ref 30.0–36.0)
MCV: 76.8 fL — ABNORMAL LOW (ref 78.0–100.0)
PLATELETS: 344 10*3/uL (ref 150–400)
RBC: 3.66 MIL/uL — ABNORMAL LOW (ref 3.87–5.11)
RDW: 14.8 % (ref 11.5–15.5)
WBC: 13 10*3/uL — ABNORMAL HIGH (ref 4.0–10.5)

## 2015-12-30 LAB — GLUCOSE, CAPILLARY
GLUCOSE-CAPILLARY: 137 mg/dL — AB (ref 65–99)
GLUCOSE-CAPILLARY: 128 mg/dL — AB (ref 65–99)
GLUCOSE-CAPILLARY: 147 mg/dL — AB (ref 65–99)
Glucose-Capillary: 98 mg/dL (ref 65–99)

## 2015-12-30 LAB — HEPARIN LEVEL (UNFRACTIONATED): HEPARIN UNFRACTIONATED: 0.33 [IU]/mL (ref 0.30–0.70)

## 2015-12-30 LAB — C DIFFICILE QUICK SCREEN W PCR REFLEX
C DIFFICILE (CDIFF) INTERP: NOT DETECTED
C DIFFICILE (CDIFF) TOXIN: NEGATIVE
C Diff antigen: NEGATIVE

## 2015-12-30 NOTE — Care Management Note (Signed)
Case Management Note  Patient Details  Name: Wendy Kerr MRN: [id number] Date of Birth: 07/01/1930  Subjective/Objective:                    Action/Plan: Spoke with patient again regarding home health , she is agreeable , choice offered she would like [facility] .   Patient still plans on getting walker from her sister and does not want a bedside commode.   Patient states she has "lots of help at Home" her son checks on her frequently and so does her four sisters.   Expected Discharge Date:                  Expected Discharge Plan:  [place]  In-House Referral:     Discharge planning Services  CM Consult  Post Acute Care Choice:  Durable Medical Equipment, Home Health Choice offered to:  Patient  DME Arranged:    DME Agency:     HH Arranged:  RN, PT [place] Agency:  [facility]  Status of Service:  Completed, signed off  If discussed at [place] of Stay Meetings, dates discussed:    Additional Comments:  Marilu Favre, RN 12/30/2015, 10:25 AM

## 2015-12-30 NOTE — Progress Notes (Signed)
7 Days Post-Op  Subjective: She looks good and was still having some loose stools. Tolerating full liquids well.  Objective: Vital signs in last 24 hours: Temp:  [97.7 F (36.5 C)-98.3 F (36.8 C)] 97.9 F (36.6 C) (11/03 0453) Pulse Rate:  [84-101] 101 (11/03 0453) Resp:  [17-20] 17 (11/03 0453) BP: (102-146)/(61-77) 102/62 (11/03 0453) SpO2:  [97 %-100 %] 97 % (11/03 0453) Weight:  [76.9 kg (169 lb 9.6 oz)] 76.9 kg (169 lb 9.6 oz) (11/03 0453) Last BM Date: 12/29/15 840 PO Voided x 5 BM x 6 K+ 3.3  WBC 13.0 Intake/Output from previous day: 11/02 0701 - 11/03 0700 In: 1113.7 [P.O.:840; I.V.:273.7] Out: -  Intake/Output this shift: Total I/O In: 240 [P.O.:240] Out: 300 [Urine:300]  General appearance: alert, cooperative and no distress Resp: Few rales in base GI: Soft, sore, having some loose stools. Tolerating full liquids well.  Lab Results:   Recent Labs  12/29/15 0532 12/30/15 0511  WBC 12.4* 13.0*  HGB 10.5* 9.9*  HCT 30.4* 28.1*  PLT 375 344    BMET  Recent Labs  12/30/15 0511  NA 132*  K 3.3*  CL 96*  CO2 26  GLUCOSE 94  BUN 91*  CREATININE 1.88*  CALCIUM 8.3*   PT/INR No results for input(s): LABPROT, INR in the last 72 hours.  No results for input(s): AST, ALT, ALKPHOS, BILITOT, PROT, ALBUMIN in the last 168 hours.   Lipase     Component Value Date/Time   LIPASE 48 12/18/2015 1900     Studies/Results: No results found.  Medications: . chlorhexidine  15 mL Mouth Rinse BID  . furosemide  20 mg Oral Daily  . insulin aspart  0-9 Units Subcutaneous TID WC & HS  . isosorbide mononitrate  30 mg Oral Daily  . mouth rinse  15 mL Mouth Rinse q12n4p   . heparin 1,100 Units/hr (12/30/15 0214)    Assessment/Plan Distal SBO involving the jejunum with necrosis, but no perforation, intertwined with right Fallopian tube and ovary- only surgery status post partial hysterectomy 1972 [facility]  OBSTRUCTION,12/23/15 Dr. Steward Ros 6 History of benign neoplasm of pancreas, found with SBO 11/11/2001 History of atrial flutter;diastolic and systolic congestive heart failure(2-D echo 05/2814 with EF 50-55% and grade 2 diastolic dysfunction) Chronic kidney disease stage III  AODM Kidney disease, acute on chronic History of obstructive sleep apnea Hypertension Hypercholesterolemia FEN: full => soft diet ID: day 5 Cipro/Flagyl completed 10/27, Cefotetan preop 12/23/15 DVT: Heparin drip and SCD's  Plan: Soft diet, she can be transitioned from heparin to oral anticoagulation if she tolerates by mouth as well. Home 24-48 hours when stable from a medical standpoint.  BM while I was there was bloody liquidy and foul-smelling per the aide. She is on a heparin drip but I will go ahead and check a C. difficile.    LOS: 10 days    Jemaine Prokop 12/30/2015 [phone]

## 2015-12-30 NOTE — Plan of Care (Signed)
Problem: Food- and Nutrition-Related Knowledge Deficit (NB-1.1) Goal: Nutrition education Formal process to instruct or train a patient/client in a skill or to impart knowledge to help patients/clients voluntarily manage or modify food choices and eating behavior to maintain or improve health. Outcome: Adequate for Discharge Nutrition Education Note  Spoke with RN; pt and family with multiple questions on what a soft diet is comprised of.  RD completed nutrition education regarding nutrition management for diverticulosis/ Diverticulitis.    RD provided "Fiber Restricted Nutrition Therapy" handout from the Academy of Nutrition and Dietetics. Reviewed patient's dietary recall and discussed ways for pt to meet nutrition goals over the next several weeks. Explained reasons for pt to follow a low fiber diet over the next 4-6 weeks. Reviewed low fiber foods and high fiber foods. Discussed best practice for long term management of diverticulosis is a high fiber diet and discussed ways to gradually increase fiber in the diet.   Teach back method used. Pt verbalizes understanding of information provided.   Expect fair to good compliance.  Body mass index is Body mass index is 28.22 kg/m.Marland Kitchen Pt meets criteria for normal weight range based on current BMI.  Current diet order is soft, patient is consuming approximately 50-100% of meals at this time. Labs and medications reviewed. No further nutrition interventions warranted at this time. RD contact information provided. If additional nutrition issues arise, please re-consult RD.  Kyana Aicher A. Jimmye Norman, RD, LDN, CDE Pager: [phone] After hours Pager: [phone]

## 2015-12-30 NOTE — Progress Notes (Signed)
PROGRESS NOTE    Wendy Kerr  [id number] DOB: Jul 07, 1930 DOA: 12/18/2015 PCP: Hoyt Koch, MD   Outpatient Specialists:     Brief Narrative:  Wendy Kerr is a 82 y.o. female with medical history significant of CHF with EF 40-45%, CKD.  Patient presents to the ED with c/o abdominal pain, N/V.  Abdominal pain is throughout the abdomen and especially in lower abdomen.  Symptoms are severe.  No fever, chills, diarrhea, dysuria.  No blood in vomit or BM.  50lb weight loss over last couple of months.  Recent admit and treatment for CHF.  she ate fried chicken from a gas station and symptoms onset shortly there after.  Nothing makes symptoms better or worse.  CT abd pelvis suggestive of enteritis, no contrast used due to kidney function, lactate trending down and is now WNL on recheck, pain control achieved with IV dilaudid.  Creatinine is 2.5 (essentially unchanged from recent labs).   Patient admitted 10-22 with abdominal pain, nausea, vomiting. nitially patient was thought to have enteritis. She continue to vomit. Repeat KUB with dilation Small bowel. Surgery consulted. Patient had NG tube placed yielding 3 L of fluid from stomach. Patient underwent exploratory laparotomy 10-27 found to have  Distal SBO involving the jejunum with necrosis, but no perforation, intertwined with right Fallopian tube and ovary.  Segment 1 foot long that was resected.   Assessment & Plan:   Principal Problem:   Enteritis presumed infectious Active Problems:   Diabetes mellitus type 2 in obese [facility])   Essential hypertension   Chronic combined systolic and diastolic heart failure (HCC)   OSA (obstructive sleep apnea)   CKD stage 3 due to type 2 diabetes mellitus (HCC)   AKI (acute kidney injury) (HCC)   Nausea   Poisoning by digoxin   Preoperative cardiovascular examination   SBO (small bowel obstruction)  1- SBO;  Distal SBO involving the jejunum with necrosis, but no perforation,  intertwined with right Fallopian tube and ovary.  -Initially patient was thought to have enteritis. She continue to vomit. Repeat KUB with dilatation of Small bowel.  -Surgery consulted. Patient had NG tube placed yielding 3 L of fluid from stomach.  -Patient underwent exploratory laparotomy 10-27 found to have  Distal SBO involving the jejunum with necrosis, but no perforation, intertwined with right Fallopian tube and ovary.  Segment 1 foot long that was resected. -Management per sx .  -slowly advancing diet; plan is for soft diet today -antibiotics discontinued after surgery completed.  DM2 - appears to be diet controlled at home -continue SSI while inpatient   HTN -  -continue Imdur and PRN hydralazine.  -will resume low dose lasix today   Leukocytosis; follow trend.  -most likely stress demargination -no signs of acute infection currently -antibiotics therapy completed and discontinued after surgery  Cronic diastolic CHF - -Continue Imdur.  -Continue to monitor on telemetry.  -Cardiology consulted, pre op evaluation and management.  -NSL fluids now -follow daily weights  -will continue low dose lasix today  AKI on CKD stage III due to type 2 DM.   -due to dehydration most likely -after IVF's given Cr back to baseline -will repeat BMET in am -will monitor trend closely  A fib; was on eliquis at home.  -will stop IV heparin now; given bloody BM -will observe overnight and if no further bleeding appreciated and Hgb stable will resume Eliquis -case discussed with surgery -continue monitoring on telemetry.  -will continue holding cardizem  for now (BP is soft); rate controlled. -CHADsVASC score 5.   Hypokalemia; resolved. -will monitor and replete as needed    DVT prophylaxis:  SCD's  Code Status: Full Code   Family Communication: Patient   Disposition Plan:  Home with [place] services at discharge. Patient with bloody BM today; will observe overnight for stability.  Continue advancing diet as per surgery rec's.   Consultants:   Surgery  Cardiology    Subjective: No fever, no CP and no SOB. Reports no abd pain. Patient had bloody BM today.  Objective: Vitals:   12/29/15 0527 12/29/15 1433 12/29/15 2114 12/30/15 0453  BP: 103/60 110/61 (!) 146/77 102/62  Pulse: (!) 105 84 92 (!) 101  Resp: 16 18 20 17   Temp: 98.1 F (36.7 C) 98.3 F (36.8 C) 97.7 F (36.5 C) 97.9 F (36.6 C)  TempSrc: Oral Oral Oral Oral  SpO2: 98% 100% 97% 97%  Weight:    76.9 kg (169 lb 9.6 oz)  Height:        Intake/Output Summary (Last 24 hours) at 12/30/15 1338 Last data filed at 12/30/15 0935  Gross per 24 hour  Intake           984.23 ml  Output              300 ml  Net           684.23 ml   Filed Weights   12/28/15 0453 12/29/15 0500 12/30/15 0453  Weight: 74.2 kg (163 lb 8 oz) 77.9 kg (171 lb 12.8 oz) 76.9 kg (169 lb 9.6 oz)    Examination:  General exam: NAD, afebrile and tolerating full liquid diet. Reports passing gas and having loose stools. Patient had bloody BM today as well. denies CP, SOB and abd pain.Marland Kitchen Respiratory system: Clear to auscultation. Respiratory effort normal. Cardiovascular system: S1 & S2 heard, RRR. No JVD, murmurs, rubs, gallops or clicks. No pedal edema. Gastrointestinal system: Abdomen is slightly distended, soft and with minimal tenderness. No organomegaly or masses felt.  Decreased, but present bowel sounds heard, dressing mid abdomen clean and intact.  Central nervous system: alert and oriented, no focal deficit  Extremities: trace edema bilaterally edema.    Data Reviewed: I have personally reviewed following labs and imaging studies  CBC:  Recent Labs Lab 12/23/15 2034 12/24/15 0612  12/26/15 0301 12/27/15 0529 12/28/15 0448 12/29/15 0532 12/30/15 0511  WBC 9.2 11.6*  < > 13.0* 11.7* 10.5 12.4* 13.0*  NEUTROABS 7.8* 10.6*  --   --   --   --   --   --   HGB 16.6* 16.1*  < > 13.8 13.5 11.4* 10.5* 9.9*  HCT  46.7* 45.6  < > 40.2 38.5 33.3* 30.4* 28.1*  MCV 79.6 78.4  < > 79.0 80.4 77.8* 77.4* 76.8*  PLT 324 348  < > 427* 450* 373 375 344  < > = values in this interval not displayed. Basic Metabolic Panel:  Recent Labs Lab 12/24/15 0612 12/24/15 1100 12/25/15 0358 12/26/15 0301 12/27/15 0529 12/30/15 0511  NA 139  --  144 144 144 132*  K 3.4*  --  3.6 3.3* 3.9 3.3*  CL 98*  --  101 100* 103 96*  CO2 24  --  29 30 26 26   GLUCOSE 129*  --  109* 121* 109* 94  BUN 106*  --  106* 112* 114* 91*  CREATININE 2.80*  --  2.51* 2.40* 2.15* 1.88*  CALCIUM 8.2*  --  8.5* 8.9 8.8* 8.3*  MG  --  2.7*  --   --   --   --    GFR: Estimated Creatinine Clearance: 23.3 mL/min (by C-G formula based on SCr of 1.88 mg/dL (H)).  BNP (last 3 results)  Recent Labs  11/09/15 1455  PROBNP 1,559.0*   CBG:  Recent Labs Lab 12/29/15 1218 12/29/15 1715 12/29/15 2105 12/30/15 0724 12/30/15 1158  GLUCAP 140* 123* 129* 98 137*   Urine analysis:    Component Value Date/Time   COLORURINE YELLOW 12/18/2015 2239   APPEARANCEUR CLEAR 12/18/2015 2239   LABSPEC 1.011 12/18/2015 2239   PHURINE 5.5 12/18/2015 2239   GLUCOSEU NEGATIVE 12/18/2015 2239   GLUCOSEU Color Interference (A) 08/15/2015 1245   HGBUR SMALL (A) 12/18/2015 2239   BILIRUBINUR NEGATIVE 12/18/2015 2239   KETONESUR NEGATIVE 12/18/2015 2239   PROTEINUR 100 (A) 12/18/2015 2239   UROBILINOGEN Color Interference (A) 08/15/2015 1245   NITRITE NEGATIVE 12/18/2015 2239   LEUKOCYTESUR NEGATIVE 12/18/2015 2239     ) Recent Results (from the past 240 hour(s))  MRSA PCR Screening     Status: None   Collection Time: 12/23/15  2:49 PM  Result Value Ref Range Status   MRSA by PCR NEGATIVE NEGATIVE Final    Comment:        The GeneXpert MRSA Assay (FDA approved for NASAL specimens only), is one component of a comprehensive MRSA colonization surveillance program. It is not intended to diagnose MRSA infection nor to guide or monitor  treatment for MRSA infections.       Anti-infectives    Start     Dose/Rate Route Frequency Ordered Stop   12/23/15 1640  cefoTEtan in Dextrose 5% (CEFOTAN) 2-2.08 GM-% IVPB    Comments:  Jones, Tomika   : cabinet override      12/23/15 1640 12/24/15 0444   12/23/15 1300  cefoTEtan (CEFOTAN) 2 g in dextrose 5 % 50 mL IVPB     2 g 100 mL/hr over 30 Minutes Intravenous To [facility] 12/23/15 1245 12/23/15 1630   12/19/15 1400  metroNIDAZOLE (FLAGYL) IVPB 500 mg  Status:  Discontinued     500 mg 100 mL/hr over 60 Minutes Intravenous Every 8 hours 12/19/15 1257 12/23/15 1924   12/19/15 1400  ciprofloxacin (CIPRO) IVPB 400 mg  Status:  Discontinued     400 mg 200 mL/hr over 60 Minutes Intravenous Every 24 hours 12/19/15 1307 12/23/15 1924      Radiology Studies: No results found.    Scheduled Meds: . chlorhexidine  15 mL Mouth Rinse BID  . furosemide  20 mg Oral Daily  . insulin aspart  0-9 Units Subcutaneous TID WC & HS  . isosorbide mononitrate  30 mg Oral Daily  . mouth rinse  15 mL Mouth Rinse q12n4p   Continuous Infusions:     LOS: 10 days    Time spent: 25 min    Barton Dubois, MD [facility] Pager [phone]  If 7PM-7AM, please contact night-coverage [email or web address] Password [email or web address] 12/30/2015, 1:38 PM

## 2015-12-30 NOTE — Progress Notes (Signed)
Physical Therapy Treatment Patient Details Name: Wendy Kerr MRN: [id number] DOB: 07-Apr-1930 Today's Date: 12/30/2015    History of Present Illness Wendy Lango Johnsonis a 82 y.o.femalewith medical history significant of CHF with EF 40-45%, CKD.  She was admitted due to severe abdominal pain, N/V.  Has had 50# weight loss in last few months.  Found to have enteritis.  Patient now s/p exploratory lap for SBO with segment resected on 12/23/15.    PT Comments    Patient progressing well with mobility and gait.  Ready for d/c from PT perspective with f/u HHPT.  Follow Up Recommendations  Home health PT;Supervision for mobility/OOB     Equipment Recommendations  Rolling walker with 5" wheels;3in1 (PT)    Recommendations for Other Services       Precautions / Restrictions Precautions Precautions: Fall Restrictions Weight Bearing Restrictions: No    Mobility  Bed Mobility               General bed mobility comments: Patient in chair  Transfers Overall transfer level: Needs assistance Equipment used: Rolling walker (2 wheeled) Transfers: Sit to/from Stand Sit to Stand: Supervision         General transfer comment: No physical assist or cues.    Ambulation/Gait Ambulation/Gait assistance: Supervision Ambulation Distance (Feet): 200 Feet Assistive device: Rolling walker (2 wheeled) Gait Pattern/deviations: Step-through pattern;Decreased stride length;Trunk flexed Gait velocity: decreased Gait velocity interpretation: Below normal speed for age/gender General Gait Details: Cues to stand upright.  Slow, steady gait with RW.   Stairs            Wheelchair Mobility    Modified Rankin (Stroke Patients Only)       Balance           Standing balance support: Bilateral upper extremity supported Standing balance-Leahy Scale: Poor                      Cognition Arousal/Alertness: Awake/alert Behavior During Therapy: WFL for tasks  assessed/performed;Flat affect Overall Cognitive Status: Within Functional Limits for tasks assessed                      Exercises      General Comments        Pertinent Vitals/Pain Pain Assessment: Faces Faces Pain Scale: Hurts a little bit Pain Location: abdomen - "it 's good today" Pain Descriptors / Indicators: Discomfort Pain Intervention(s): Monitored during session    Home Living                      Prior Function            PT Goals (current goals can now be found in the care plan section) Acute Rehab PT Goals Patient Stated Goal: To return to independent Progress towards PT goals: Progressing toward goals    Frequency    Min 3X/week      PT Plan Current plan remains appropriate    Co-evaluation             End of Session Equipment Utilized During Treatment: Gait belt Activity Tolerance: Patient tolerated treatment well Patient left: in chair;with call bell/phone within reach     Time: 1715-1726 PT Time Calculation (min) (ACUTE ONLY): 11 min  Charges:  $Gait Training: 8-22 mins                    G Codes:      Henderson Newcomer  H 12/30/2015, 5:34 PM Wendy Kerr. Sanjuana Kava, [place] Pager [phone]

## 2015-12-31 DIAGNOSIS — E785 Hyperlipidemia, unspecified: Secondary | ICD-10-CM

## 2015-12-31 LAB — GLUCOSE, CAPILLARY
GLUCOSE-CAPILLARY: 144 mg/dL — AB (ref 65–99)
Glucose-Capillary: 108 mg/dL — ABNORMAL HIGH (ref 65–99)
Glucose-Capillary: 125 mg/dL — ABNORMAL HIGH (ref 65–99)
Glucose-Capillary: 147 mg/dL — ABNORMAL HIGH (ref 65–99)

## 2015-12-31 LAB — BASIC METABOLIC PANEL
ANION GAP: 11 (ref 5–15)
BUN: 84 mg/dL — AB (ref 6–20)
CHLORIDE: 95 mmol/L — AB (ref 101–111)
CO2: 25 mmol/L (ref 22–32)
Calcium: 8.2 mg/dL — ABNORMAL LOW (ref 8.9–10.3)
Creatinine, Ser: 1.96 mg/dL — ABNORMAL HIGH (ref 0.44–1.00)
GFR calc Af Amer: 26 mL/min — ABNORMAL LOW (ref >60)
GFR calc non Af Amer: 22 mL/min — ABNORMAL LOW (ref >60)
GLUCOSE: 93 mg/dL (ref 65–99)
POTASSIUM: 3.5 mmol/L (ref 3.5–5.1)
Sodium: 131 mmol/L — ABNORMAL LOW (ref 135–145)

## 2015-12-31 LAB — CBC
HEMATOCRIT: 26.8 % — AB (ref 36.0–46.0)
HEMOGLOBIN: 9.2 g/dL — AB (ref 12.0–15.0)
MCH: 26.7 pg (ref 26.0–34.0)
MCHC: 34.3 g/dL (ref 30.0–36.0)
MCV: 77.7 fL — AB (ref 78.0–100.0)
Platelets: 370 10*3/uL (ref 150–400)
RBC: 3.45 MIL/uL — AB (ref 3.87–5.11)
RDW: 14.9 % (ref 11.5–15.5)
WBC: 12.2 10*3/uL — AB (ref 4.0–10.5)

## 2015-12-31 MED ORDER — POTASSIUM CHLORIDE CRYS ER 20 MEQ PO TBCR
40.0000 meq | EXTENDED_RELEASE_TABLET | ORAL | Status: AC
  Administered 2015-12-31 (×2): 40 meq via ORAL
  Filled 2015-12-31 (×2): qty 2

## 2015-12-31 MED ORDER — ATORVASTATIN CALCIUM 20 MG PO TABS
20.0000 mg | ORAL_TABLET | Freq: Every day | ORAL | Status: DC
  Administered 2015-12-31 – 2016-01-01 (×2): 20 mg via ORAL
  Filled 2015-12-31 (×2): qty 1

## 2015-12-31 MED ORDER — APIXABAN 2.5 MG PO TABS
2.5000 mg | ORAL_TABLET | Freq: Two times a day (BID) | ORAL | Status: DC
  Administered 2015-12-31 – 2016-01-01 (×2): 2.5 mg via ORAL
  Filled 2015-12-31 (×2): qty 1

## 2015-12-31 NOTE — Progress Notes (Signed)
8 Days Post-Op  Subjective: No complaints. No n/v. +bms. Walked. Eating about 50% of food on tray. No more bloody BMs. cdiff negative  Objective: Vital signs in last 24 hours: Temp:  [98.3 F (36.8 C)-98.6 F (37 C)] 98.6 F (37 C) (11/04 0546) Pulse Rate:  [97-98] 98 (11/04 0546) Resp:  [18] 18 (11/04 0546) BP: (102-118)/(62-71) 118/62 (11/04 0546) SpO2:  [94 %-100 %] 100 % (11/04 0546) Weight:  [77.4 kg (170 lb 9.6 oz)] 77.4 kg (170 lb 9.6 oz) (11/04 0500) Last BM Date: 12/30/15  Intake/Output from previous day: 11/03 0701 - 11/04 0700 In: 480 [P.O.:480] Out: 600 [Urine:600] Intake/Output this shift: No intake/output data recorded.  Alert, resting comfortably cta No tachy Soft, obese, incision c/d/i; min TTP No edema  Lab Results:   Recent Labs  12/30/15 0511 12/31/15 0400  WBC 13.0* 12.2*  HGB 9.9* 9.2*  HCT 28.1* 26.8*  PLT 344 370   BMET  Recent Labs  12/30/15 0511 12/31/15 0731  NA 132* 131*  K 3.3* 3.5  CL 96* 95*  CO2 26 25  GLUCOSE 94 93  BUN 91* 84*  CREATININE 1.88* 1.96*  CALCIUM 8.3* 8.2*   PT/INR No results for input(s): LABPROT, INR in the last 72 hours. ABG No results for input(s): PHART, HCO3 in the last 72 hours.  Invalid input(s): PCO2, PO2  Studies/Results: No results found.  Anti-infectives: Anti-infectives    Start     Dose/Rate Route Frequency Ordered Stop   12/23/15 1640  cefoTEtan in Dextrose 5% (CEFOTAN) 2-2.08 GM-% IVPB    Comments:  Connye Burkitt   : cabinet override      12/23/15 1640 12/24/15 0444   12/23/15 1300  cefoTEtan (CEFOTAN) 2 g in dextrose 5 % 50 mL IVPB     2 g 100 mL/hr over 30 Minutes Intravenous To ShortStay Surgical 12/23/15 1245 12/23/15 1630   12/19/15 1400  metroNIDAZOLE (FLAGYL) IVPB 500 mg  Status:  Discontinued     500 mg 100 mL/hr over 60 Minutes Intravenous Every 8 hours 12/19/15 1257 12/23/15 1924   12/19/15 1400  ciprofloxacin (CIPRO) IVPB 400 mg  Status:  Discontinued     400 mg 200  mL/hr over 60 Minutes Intravenous Every 24 hours 12/19/15 1307 12/23/15 1924      Assessment/Plan: Distal SBO involving the jejunum with necrosis, but no perforation, intertwined with right Fallopian tube and ovary- only surgery status post partial hysterectomy 1972 [place] OBSTRUCTION,12/23/15 Dr. Steward Ros 7 History of benign neoplasm of pancreas, found with SBO 11/11/2001 History of atrial flutter;diastolic and systolic congestive heart failure(2-D echo 05/2814 with EF 50-55% and grade 2 diastolic dysfunction) Chronic kidney disease stage III  AODM Kidney disease, acute on chronic History of obstructive sleep apnea Hypertension Hypercholesterolemia FEN: full => soft diet ID: day 5 Cipro/Flagyl completed 10/27, Cefotetan preop 12/23/15 DVT: Heparin drip and SCD's  cdiff negative hgb stable Ok for dc from surgical point of view Discussed dc instructions with pt F/u on chart.  Can convert to oral anticoagulation. Would rec a cbc mid week  Leighton Ruff. Redmond Pulling, MD, FACS General, Bariatric, & Minimally Invasive Surgery [facility], [place]   LOS: 11 days    Gayland Curry 12/31/2015

## 2015-12-31 NOTE — Progress Notes (Signed)
PROGRESS NOTE    Wendy Kerr  [id number] DOB: [birth year] DOA: 12/18/2015 PCP: Hoyt Koch, MD   Outpatient Specialists:     Brief Narrative:  Wendy Kerr is a 82 y.o. female with medical history significant of CHF with EF 40-45%, CKD.  Patient presents to the ED with c/o abdominal pain, N/V.  Abdominal pain is throughout the abdomen and especially in lower abdomen.  Symptoms are severe.  No fever, chills, diarrhea, dysuria.  No blood in vomit or BM.  50lb weight loss over last couple of months.  Recent admit and treatment for CHF.  she ate fried chicken from a gas station and symptoms onset shortly there after.  Nothing makes symptoms better or worse.  CT abd pelvis suggestive of enteritis, no contrast used due to kidney function, lactate trending down and is now WNL on recheck, pain control achieved with IV dilaudid.  Creatinine is 2.5 (essentially unchanged from recent labs).   Patient admitted 10-22 with abdominal pain, nausea, vomiting. nitially patient was thought to have enteritis. She continue to vomit. Repeat KUB with dilation Small bowel. Surgery consulted. Patient had NG tube placed yielding 3 L of fluid from stomach. Patient underwent exploratory laparotomy 10-27 found to have  Distal SBO involving the jejunum with necrosis, but no perforation, intertwined with right Fallopian tube and ovary.  Segment 1 foot long that was resected.   Assessment & Plan:   Principal Problem:   Enteritis presumed infectious Active Problems:   Diabetes mellitus type 2 in obese [facility])   Essential hypertension   Chronic combined systolic and diastolic heart failure (HCC)   OSA (obstructive sleep apnea)   CKD stage 3 due to type 2 diabetes mellitus (HCC)   AKI (acute kidney injury) (HCC)   Nausea   Poisoning by digoxin   Preoperative cardiovascular examination   SBO (small bowel obstruction)  1- SBO;  Distal SBO involving the jejunum with necrosis, but no perforation,  intertwined with right Fallopian tube and ovary.  -Initially patient was thought to have enteritis. She continue to vomit. Repeat KUB with dilatation of Small bowel.  -Surgery consulted. Patient had NG tube placed yielding 3 L of fluid from stomach.  -Patient underwent exploratory laparotomy 10-27 found to have  Distal SBO involving the jejunum with necrosis, but no perforation, intertwined with right Fallopian tube and ovary.  Segment 1 foot long that was resected. -Management per sx .  -slowly advancing diet; plan is for soft diet today -antibiotics discontinued after surgery completed.  DM2 - appears to be diet controlled at home -continue SSI while inpatient   HTN -  -continue Imdur and PRN hydralazine.  -will resume low dose lasix today   Leukocytosis; follow trend.  -most likely stress demargination -no signs of acute infection currently -antibiotics therapy completed and discontinued after surgery  Cronic diastolic CHF - -Continue Imdur.  -Continue to monitor on telemetry.  -Cardiology consulted, pre op evaluation and management.  -NSL fluids now -follow daily weights  -will continue low dose lasix today  AKI on CKD stage III due to type 2 DM.   -due to dehydration most likely -after IVF's given Cr back to baseline -will repeat BMET in am -will monitor trend closely  A fib; was on eliquis at home.  -will resume Eliquis today -continue monitoring on telemetry.  -will continue holding cardizem for now (BP is soft); rate controlled. -CHADsVASC score 5.   Hypokalemia; resolved. -slightly low (most likely from lasix use and poor  food intake in general) -will replete and monitor   HLD -will resume lipitor  DVT prophylaxis:  SCD's and now back on Eliquis  Code Status: Full Code   Family Communication: Patient   Disposition Plan:  Home with [facility] services at discharge. Patient with no further bloody BM; will advance diet further as recommended by surgery; start  Eliquis, replete electrolytes and discharge home in am.  Consultants:   Surgery  Cardiology    Subjective: No fever, no CP and no SOB. Reports feeling weak and reports having BM's and not noticing any blood in it.  Objective: Vitals:   12/30/15 2157 12/31/15 0500 12/31/15 0546 12/31/15 1314  BP: 102/62  118/62 107/67  Pulse: 98  98 99  Resp: 18  18   Temp: 98.3 F (36.8 C)  98.6 F (37 C) 97.7 F (36.5 C)  TempSrc: Oral  Oral Oral  SpO2: 94%  100% 100%  Weight:  77.4 kg (170 lb 9.6 oz)    Height:        Intake/Output Summary (Last 24 hours) at 12/31/15 1740 Last data filed at 12/31/15 1537  Gross per 24 hour  Intake                0 ml  Output              500 ml  Net             -500 ml   Filed Weights   12/29/15 0500 12/30/15 0453 12/31/15 0500  Weight: 77.9 kg (171 lb 12.8 oz) 76.9 kg (169 lb 9.6 oz) 77.4 kg (170 lb 9.6 oz)    Examination:  General exam: NAD, afebrile and tolerating soft diet now. Reports passing gas and having BM's. no further episodes of blood in her stools appreciated. Patient is feeling weak and deconditioned. Low potassium appreciated. Denies CP, SOB and abd pain.Marland Kitchen Respiratory system: Clear to auscultation. Respiratory effort normal. Cardiovascular system: S1 & S2 heard, RRR. No JVD, murmurs, rubs, gallops or clicks. No pedal edema. Gastrointestinal system: Abdomen is minimally distended, soft and with minimal tenderness. No organomegaly or masses felt.  Present BS, dressing mid abdomen clean and intact.  Central nervous system: alert and oriented, no focal deficit  Extremities: trace edema bilaterally edema.    Data Reviewed: I have personally reviewed following labs and imaging studies  CBC:  Recent Labs Lab 12/27/15 0529 12/28/15 0448 12/29/15 0532 12/30/15 0511 12/31/15 0400  WBC 11.7* 10.5 12.4* 13.0* 12.2*  HGB 13.5 11.4* 10.5* 9.9* 9.2*  HCT 38.5 33.3* 30.4* 28.1* 26.8*  MCV 80.4 77.8* 77.4* 76.8* 77.7*  PLT 450* 373 375  344 0000000   Basic Metabolic Panel:  Recent Labs Lab 12/25/15 0358 12/26/15 0301 12/27/15 0529 12/30/15 0511 12/31/15 0731  NA 144 144 144 132* 131*  K 3.6 3.3* 3.9 3.3* 3.5  CL 101 100* 103 96* 95*  CO2 29 30 26 26 25   GLUCOSE 109* 121* 109* 94 93  BUN 106* 112* 114* 91* 84*  CREATININE 2.51* 2.40* 2.15* 1.88* 1.96*  CALCIUM 8.5* 8.9 8.8* 8.3* 8.2*   GFR: Estimated Creatinine Clearance: 22.4 mL/min (by C-G formula based on SCr of 1.96 mg/dL (H)).  BNP (last 3 results)  Recent Labs  11/09/15 1455  PROBNP 1,559.0*   CBG:  Recent Labs Lab 12/30/15 1707 12/30/15 2107 12/31/15 0833 12/31/15 1143 12/31/15 1648  GLUCAP 128* 147* 108* 125* 144*   Urine analysis:    Component Value Date/Time  COLORURINE YELLOW 12/18/2015 2239   APPEARANCEUR CLEAR 12/18/2015 2239   LABSPEC 1.011 12/18/2015 2239   PHURINE 5.5 12/18/2015 2239   GLUCOSEU NEGATIVE 12/18/2015 2239   GLUCOSEU Color Interference (A) 08/15/2015 1245   HGBUR SMALL (A) 12/18/2015 2239   BILIRUBINUR NEGATIVE 12/18/2015 2239   KETONESUR NEGATIVE 12/18/2015 2239   PROTEINUR 100 (A) 12/18/2015 2239   UROBILINOGEN Color Interference (A) 08/15/2015 1245   NITRITE NEGATIVE 12/18/2015 2239   LEUKOCYTESUR NEGATIVE 12/18/2015 2239     ) Recent Results (from the past 240 hour(s))  MRSA PCR Screening     Status: None   Collection Time: 12/23/15  2:49 PM  Result Value Ref Range Status   MRSA by PCR NEGATIVE NEGATIVE Final    Comment:        The GeneXpert MRSA Assay (FDA approved for NASAL specimens only), is one component of a comprehensive MRSA colonization surveillance program. It is not intended to diagnose MRSA infection nor to guide or monitor treatment for MRSA infections.   C difficile quick scan w PCR reflex     Status: None   Collection Time: 12/30/15  1:50 PM  Result Value Ref Range Status   C Diff antigen NEGATIVE NEGATIVE Final   C Diff toxin NEGATIVE NEGATIVE Final   C Diff interpretation  No C. difficile detected.  Final      Anti-infectives    Start     Dose/Rate Route Frequency Ordered Stop   12/23/15 1640  cefoTEtan in Dextrose 5% (CEFOTAN) 2-2.08 GM-% IVPB    Comments:  Wendy Kerr, Wendy Kerr   : cabinet override      12/23/15 1640 12/24/15 0444   12/23/15 1300  cefoTEtan (CEFOTAN) 2 g in dextrose 5 % 50 mL IVPB     2 g 100 mL/hr over 30 Minutes Intravenous To ShortStay Surgical 12/23/15 1245 12/23/15 1630   12/19/15 1400  metroNIDAZOLE (FLAGYL) IVPB 500 mg  Status:  Discontinued     500 mg 100 mL/hr over 60 Minutes Intravenous Every 8 hours 12/19/15 1257 12/23/15 1924   12/19/15 1400  ciprofloxacin (CIPRO) IVPB 400 mg  Status:  Discontinued     400 mg 200 mL/hr over 60 Minutes Intravenous Every 24 hours 12/19/15 1307 12/23/15 1924      Radiology Studies: No results found.    Scheduled Meds: . apixaban  2.5 mg Oral BID  . atorvastatin  20 mg Oral q1800  . chlorhexidine  15 mL Mouth Rinse BID  . furosemide  20 mg Oral Daily  . insulin aspart  0-9 Units Subcutaneous TID WC & HS  . isosorbide mononitrate  30 mg Oral Daily   Continuous Infusions:     LOS: 11 days    Time spent: 25 min    Barton Dubois, MD [facility] Pager [phone]  If 7PM-7AM, please contact night-coverage [email or web address] Password [email or web address] 12/31/2015, 5:40 PM

## 2016-01-01 LAB — BASIC METABOLIC PANEL
Anion gap: 8 (ref 5–15)
BUN: 80 mg/dL — ABNORMAL HIGH (ref 6–20)
CO2: 27 mmol/L (ref 22–32)
Calcium: 8.5 mg/dL — ABNORMAL LOW (ref 8.9–10.3)
Chloride: 98 mmol/L — ABNORMAL LOW (ref 101–111)
Creatinine, Ser: 2.1 mg/dL — ABNORMAL HIGH (ref 0.44–1.00)
GFR calc Af Amer: 24 mL/min — ABNORMAL LOW (ref >60)
GFR calc non Af Amer: 21 mL/min — ABNORMAL LOW (ref >60)
Glucose, Bld: 100 mg/dL — ABNORMAL HIGH (ref 65–99)
Potassium: 4.9 mmol/L (ref 3.5–5.1)
Sodium: 133 mmol/L — ABNORMAL LOW (ref 135–145)

## 2016-01-01 LAB — CBC
HCT: 27.5 % — ABNORMAL LOW (ref 36.0–46.0)
Hemoglobin: 9.3 g/dL — ABNORMAL LOW (ref 12.0–15.0)
MCH: 26.8 pg (ref 26.0–34.0)
MCHC: 33.8 g/dL (ref 30.0–36.0)
MCV: 79.3 fL (ref 78.0–100.0)
PLATELETS: 399 10*3/uL (ref 150–400)
RBC: 3.47 MIL/uL — AB (ref 3.87–5.11)
RDW: 15.7 % — ABNORMAL HIGH (ref 11.5–15.5)
WBC: 9.9 10*3/uL (ref 4.0–10.5)

## 2016-01-01 LAB — GLUCOSE, CAPILLARY
GLUCOSE-CAPILLARY: 132 mg/dL — AB (ref 65–99)
GLUCOSE-CAPILLARY: 126 mg/dL — AB (ref 65–99)
Glucose-Capillary: 129 mg/dL — ABNORMAL HIGH (ref 65–99)

## 2016-01-01 MED ORDER — FUROSEMIDE 80 MG PO TABS: 40.0000 mg | ORAL_TABLET | Freq: Every day | ORAL | Status: DC

## 2016-01-01 MED ORDER — HYDRALAZINE HCL 25 MG PO TABS: 12.5000 mg | ORAL_TABLET | Freq: Two times a day (BID) | ORAL | Status: DC

## 2016-01-01 NOTE — Progress Notes (Signed)
Patient discharged to home with instructions. 

## 2016-01-01 NOTE — Progress Notes (Signed)
Pt mid abdomen wound with staples no dressing noted.

## 2016-01-01 NOTE — Discharge Summary (Signed)
Physician Discharge Summary  FERREL TURCO [id number] DOB: 1930/05/26 DOA: 12/18/2015  PCP: Hoyt Koch, MD  Admit date: 12/18/2015 Discharge date: 01/01/2016  Time spent: 35 minutes  Recommendations for Outpatient Follow-up:  1. Repeat BMET to follow up electrolytes and renal function  2. Repeat CBC to follow Hgb trend 3. Reassess volume status and BP; please adjust antihypertensive regimen and diuretic regimen as needed    Discharge Diagnoses:  Principal Problem:   Enteritis presumed infectious Active Problems:   Diabetes mellitus type 2 in obese [facility])   Essential hypertension   Chronic combined systolic and diastolic heart failure (HCC)   OSA (obstructive sleep apnea)   CKD stage 3 due to type 2 diabetes mellitus (HCC)   AKI (acute kidney injury) (HCC)   Nausea   Poisoning by digoxin   Preoperative cardiovascular examination   SBO (small bowel obstruction)   Discharge Condition: stable and improved. Discharge home with [facility] for PT. Will follow up with PCP in 10 days and also with general surgery on 01/09/16.  Diet recommendation: modified carbohydrates and heart healthy diet   Filed Weights   12/30/15 0453 12/31/15 0500 01/01/16 0500  Weight: 76.9 kg (169 lb 9.6 oz) 77.4 kg (170 lb 9.6 oz) 76.2 kg (168 lb)    History of present illness:  82 y.o. female with medical history significant of CHF with EF 40-45%, CKD.  Patient presents to the ED with c/o abdominal pain, N/V.  Abdominal pain is throughout the abdomen and especially in lower abdomen.  Symptoms are severe.  No fever, chills, diarrhea, dysuria.  No blood in vomit or BM.  50lb weight loss over last couple of months.  Recent admit and treatment for CHF.  Today she ate fried chicken from a gas station and symptoms onset shortly there after.  Nothing makes symptoms better or worse.  ED Course: CT abd pelvis suggestive of enteritis, no contrast used due to kidney function, lactate trending down  and is now WNL on recheck, pain control achieved with IV dilaudid.  Creatinine is 2.5 (essentially unchanged from recent labs).  Hospital Course:  1- SBO;  Distal SBO involving the jejunum with necrosis, but no perforation, intertwined with right Fallopian tube and ovary.  -Initially patient was thought to have enteritis. She continue to vomit. Repeat KUB with dilatation of Small bowel; Surgery consulted. Patient had NG tube placed yielding 3 L of fluid from stomach. Patient underwent exploratory laparotomy 10-27 found to have  Distal SBO involving the jejunum with necrosis, but no perforation, intertwined with right Fallopian tube and ovary. Segment 1 foot long that was resected. -diet now advance and well tolerated; advise to maintain adequate hydration  -antibiotics discontinued after surgery completed; no signs of infection at discharge.  DM2 - appears to be diet controlled at home -continue modified carbohydrates diet   HTN -  -stable but soft -medications adjusted to minimize hypotension -advise to follow low sodium diet   Leukocytosis; follow trend.  -most likely stress demargination -no signs of acute infection currently -antibiotics therapy completed and discontinued after surgery  Cronic combined systolic and diastolic CHF - -Continue Imdur, adjusted dose of hydralazine and decrease dose of lasix.  compensated at discharge -outpatient follow up with cardiology recommended  -advise to follow low sodium diet and to follow daily weights  -last EF 40-45% -no ARB or ACE due to renal function   AKI on CKD stage III due to type 2 DM.   -due to dehydration most  likely -after IVF's given Cr back to baseline -will recommend repeat of BMET during follow up visit -advise to maintain adequate hydration and to follow low sodium diet   A fib; was on eliquis at home.  -will resume Eliquis at discharge -continue digoxin three time per week as previously prescribed -CHADsVASC  score 5.  -rate controlled and stable at discharge  Hypokalemia; resolved. -slightly low (most likely from lasix use and poor food intake in general) -repleted and WNL at discharge -will recommend BMET at follow up to assess electrolytes and renal function   HLD -will resume lipitor  Physical deconditioning and wound monitoring -HH PT and HHRN arranged  Procedures:  EXPLORATORY LAPAROTOMY FOR SMALL BOWEL OBSTRUCTION  12/23/15  Consultations:  General surgery   Cardiology   Discharge Exam: Vitals:   01/01/16 0613 01/01/16 1441  BP: (!) 105/56 (!) 98/55  Pulse: 93 94  Resp: 18   Temp: 98 F (36.7 C) 98 F (36.7 C)   General exam: NAD, afebrile and tolerating diet properly. Reports passing gas and having BM's without any further episodes of blood in her stools appreciated. Patient is feeling good today, even a little weak. Denies CP, SOB and abd pain. Electrolytes WNL at discharge. Respiratory system: Clear to auscultation. Respiratory effort normal. Cardiovascular system: S1 & S2 heard, RRR. No JVD, murmurs, rubs, gallops or clicks. No pedal edema. Gastrointestinal system: Abdomen is minimally distended, soft and with minimal tenderness. No organomegaly or masses felt.  Present BS, dressing mid abdomen clean and intact.  Central nervous system: alert and oriented, no focal deficit  Extremities: trace edema bilaterally edema.    Discharge Instructions   Discharge Instructions    Diet - low sodium heart healthy    Complete by:  As directed    Discharge instructions    Complete by:  As directed    Follow heart healthy diet (sodium intake less than 2-2.3 gram daily) Take medications as prescribed  Arrange follow up with PCP in 10 days Follow up with general surgery as instructed Maintain adequate hydration  Check weight on daily basis (if > 3 pounds gain overnight and/or > 5 pounds in 1 week, please contact Dr. Tamala Julian office)     Current Discharge Medication List     CONTINUE these medications which have CHANGED   Details  furosemide (LASIX) 80 MG tablet Take 0.5 tablets (40 mg total) by mouth daily.    hydrALAZINE (APRESOLINE) 25 MG tablet Take 0.5 tablets (12.5 mg total) by mouth 2 (two) times daily.      CONTINUE these medications which have NOT CHANGED   Details  atorvastatin (LIPITOR) 20 MG tablet Take 1 tablet (20 mg total) by mouth daily at 6 PM. Qty: 30 tablet, Refills: 6    digoxin (LANOXIN) 0.125 MG tablet Take 0.5 tablets (0.0625 mg total) by mouth every Monday, Wednesday, and Friday. Qty: 15 tablet, Refills: 5    ELIQUIS 2.5 MG TABS tablet TAKE 1 TABLET BY MOUTH 2 TIMES DAILY. Qty: 60 tablet, Refills: 8    isosorbide mononitrate (IMDUR) 60 MG 24 hr tablet Take 0.5 tablets (30 mg total) by mouth daily. Qty: 30 tablet, Refills: 8    Multiple Vitamin (MULTIVITAMIN WITH MINERALS) TABS tablet Take 1 tablet by mouth every morning.    OVER THE COUNTER MEDICATION Take 1 tablet by mouth daily. MED NAME: IRON    nitroGLYCERIN (NITROSTAT) 0.4 MG SL tablet Place 1 tablet (0.4 mg total) under the tongue every 5 (five) minutes as  needed for chest pain. Qty: 5 tablet, Refills: 3      STOP taking these medications     diltiazem (CARDIZEM CD) 120 MG 24 hr capsule      metolazone (ZAROXOLYN) 2.5 MG tablet        Allergies  Allergen Reactions  . Piroxicam Other (See Comments)    REACTION: HEADACHE   Follow-up Information    [facility] Follow up on 01/06/2016.   Specialty:  General Surgery Why:  You will have your staples removed by one of our nurses on this visit.  Be at the office 30 minutes early for check in.   Contact information: [address] [place] [address] [phone]        Judeth Horn, MD Follow up on 01/09/2016.   Specialty:  General Surgery Why:  Your appointment is at 3:50 PM, be at the office 30 minutes early.   Contact information: [address] [place]  [address] [phone]        Hoyt Koch, MD. Schedule an appointment as soon as possible for a visit in 1 week(s).   Specialty:  Internal Medicine Contact information: [place] [address] [phone]           The results of significant diagnostics from this hospitalization (including imaging, microbiology, ancillary and laboratory) are listed below for reference.    Significant Diagnostic Studies: Ct Abdomen Pelvis Wo Contrast  Result Date: 12/18/2015 CLINICAL DATA:  82 year old female with acute abdominal pain. History of pancreatic mass. Patient feels nauseous. EXAM: CT ABDOMEN AND PELVIS WITHOUT CONTRAST TECHNIQUE: Multidetector CT imaging of the abdomen and pelvis was performed following the standard protocol without IV contrast. COMPARISON:  CT dated 11/14/2015 and ultrasound dated 11/15/2015 FINDINGS: Evaluation of this exam is limited in the absence of intravenous contrast. Lower chest: Minimal left lung base atelectatic changes. Cardiomegaly with partially visualized small pericardial effusion. No intra-abdominal free air. Small ascites, decreased from prior study. Hepatobiliary: Irregularity of the hepatic contour may represent underlying cirrhosis. Clinical correlation is recommended. No intrahepatic biliary ductal dilatation. Mild periportal edema. The gallbladder is unremarkable. Pancreas: There is 4.5 x 5.0 cm (previously 4.8 x 5.0 cm) low attenuating mass in the body of the pancreas. There is no associated gland atrophy or dilatation of the main pancreatic duct. Spleen: Normal in size without focal abnormality. Adrenals/Urinary Tract: There is a 4.8 x 4.1 cm low attenuating lesion arising from the left adrenal gland. This lesion demonstrates macroscopic fat and may represent myelo lipoma. Other etiologies are not excluded. This is similar to the prior CT. The right adrenal gland appears unremarkable. There is mild bilateral renal atrophy. A 1.5 cm  left renal interpolar hypodense lesion is not well characterized but likely represents a cyst. There is mild right hydronephrosis. A 5 mm calculus along the course of the proximal right ureter appears in stable positioning as the prior CT and likely represents vascular calcification. No definite renal or ureteral calculi identified. There is no hydronephrosis on the left. The urinary bladder is unremarkable. Stomach/Bowel: There is extensive sigmoid and colonic diverticulosis without active inflammatory changes. No evidence of bowel obstruction. Multiple nondilated fluid-filled loops of small bowel throughout the abdomen may be physiologic or represent enteritis. The appendix is not visualized with certainty. No inflammatory changes identified in the right lower quadrant. Vascular/Lymphatic: There is moderate aortoiliac atherosclerotic disease. The abdominal aorta and IVC are otherwise grossly unremarkable on this noncontrast study.  No portal venous gas identified. There is no adenopathy. Reproductive: Hysterectomy. Other: Midline vertical anterior pelvic wall incisional scar. Mild diffuse subcutaneous stranding and edema. Musculoskeletal: Multilevel degenerative changes of the spine. Multilevel disc desiccation with vacuum phenomena. No acute fracture. IMPRESSION: Nondistended fluid-filled loops of small bowel may represent enteritis. Clinical correlation is recommended. No bowel obstruction. Colonic diverticulosis without active inflammatory changes. Hypo attenuating mass in the body of the pancreas similar to the prior CT. No gland atrophy or ductal dilatation. 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 Other etiologies are not excluded. Mild right hydronephrosis no stone identified. Correlation with urinalysis recommended to exclude UTI. Small ascites decreased from prior study. Possible cirrhosis.  Clinical correlation is recommended. Electronically Signed   By: Anner Crete M.D.   On:  12/18/2015 22:42   Dg Abd 1 View  Result Date: 12/22/2015 CLINICAL DATA:  Follow-up film.  NG tube present. EXAM: ABDOMEN - 1 VIEW COMPARISON:  December 22, 2015 4:56 a.m. FINDINGS: Air-filled dilated small bowel loops are identified slightly more prominent compared to prior exam. A NG tube is identified distal tip in the distal stomach. Degenerative joint changes of the spine are identified. IMPRESSION: NG tube is identified distal tip in the distal stomach. Small bowel obstruction, slightly worse compared to prior exam. Electronically Signed   By: Abelardo Diesel M.D.   On: 12/22/2015 14:31   Dg Abd 2 Views  Result Date: 12/23/2015 CLINICAL DATA:  Abdominal pain.  Follow-up small bowel obstruction. EXAM: ABDOMEN - 2 VIEW COMPARISON:  12/22/2015 FINDINGS: There persistent dilated loops of small bowel with limited air noted in a nondistended colon. There is contrast projecting in the gastric fundus unchanged from the previous day's study. Multiple air-fluid levels are noted on the decubitus view. There is no free air. IMPRESSION: 1. Persistent high-grade small bowel obstruction. Electronically Signed   By: Lajean Manes M.D.   On: 12/23/2015 09:36   Dg Abd Portable 1v-small Bowel Obstruction Protocol-initial, 8 Hr Delay  Result Date: 12/22/2015 CLINICAL DATA:  Small-bowel obstruction . EXAM: PORTABLE ABDOMEN - 1 VIEW COMPARISON:  12/21/2015 FINDINGS: NG tube noted with tip in the stomach. Contrast noted within the stomach. Persistent unchanged distended loops of small bowel suggesting small bowel obstruction. No free air. Pelvic calcifications consistent phleboliths. Degenerative changes lumbar spine . IMPRESSION: NG tube in stable position with its tip in the stomach. Contrast is in the stomach. 2. Persistent unchanged distended loops of small bowel suggesting small bowel obstruction. Electronically Signed   By: Marcello Moores  Register   On: 12/22/2015 07:17   Dg Abd Portable 1v-small Bowel Protocol-position  Verification  Result Date: 12/21/2015 CLINICAL DATA:  NG tube placement EXAM: PORTABLE ABDOMEN - 1 VIEW COMPARISON:  12/21/2015 FINDINGS: Heart size is enlarged. Esophageal tube tip overlies the mid stomach. Persistent small bowel gaseous dilatation in the central abdomen measuring up to 4 cm. IMPRESSION: 1. Esophageal tube tip overlies the body of the stomach. 2. Persistent gaseous dilatation of small bowel in the central abdomen, possible bowel obstruction. Electronically Signed   By: Donavan Foil M.D.   On: 12/21/2015 20:18   Dg Abd Portable 1v  Result Date: 12/21/2015 CLINICAL DATA:  Nausea vomiting.  Constipation. EXAM: PORTABLE ABDOMEN - 1 VIEW COMPARISON:  CT 12/18/2015. FINDINGS: Soft tissue structures are unremarkable. Mild gastric distention. Prominent dilated loops of proximal small bowel are noted. Small-bowel obstruction cannot be excluded. Abdominal and pelvic calcifications consistent with phleboliths. Degenerative changes lumbar spine and both hips. IMPRESSION:  Mild gastric distention. Prominent dilated loops of proximal small bowel noted. Small-bowel obstruction cannot be excluded. Electronically Signed   By: Marcello Moores  Register   On: 12/21/2015 11:32    Microbiology: Recent Results (from the past 240 hour(s))  MRSA PCR Screening     Status: None   Collection Time: 12/23/15  2:49 PM  Result Value Ref Range Status   MRSA by PCR NEGATIVE NEGATIVE Final    Comment:        The GeneXpert MRSA Assay (FDA approved for NASAL specimens only), is one component of a comprehensive MRSA colonization surveillance program. It is not intended to diagnose MRSA infection nor to guide or monitor treatment for MRSA infections.   C difficile quick scan w PCR reflex     Status: None   Collection Time: 12/30/15  1:50 PM  Result Value Ref Range Status   C Diff antigen NEGATIVE NEGATIVE Final   C Diff toxin NEGATIVE NEGATIVE Final   C Diff interpretation No C. difficile detected.  Final      Labs: Basic Metabolic Panel:  Recent Labs Lab 12/26/15 0301 12/27/15 0529 12/30/15 0511 12/31/15 0731 01/01/16 0511  NA 144 144 132* 131* 133*  K 3.3* 3.9 3.3* 3.5 4.9  CL 100* 103 96* 95* 98*  CO2 30 26 26 25 27   GLUCOSE 121* 109* 94 93 100*  BUN 112* 114* 91* 84* 80*  CREATININE 2.40* 2.15* 1.88* 1.96* 2.10*  CALCIUM 8.9 8.8* 8.3* 8.2* 8.5*   CBC:  Recent Labs Lab 12/28/15 0448 12/29/15 0532 12/30/15 0511 12/31/15 0400 01/01/16 0511  WBC 10.5 12.4* 13.0* 12.2* 9.9  HGB 11.4* 10.5* 9.9* 9.2* 9.3*  HCT 33.3* 30.4* 28.1* 26.8* 27.5*  MCV 77.8* 77.4* 76.8* 77.7* 79.3  PLT 373 375 344 370 399   BNP (last 3 results)  Recent Labs  12/22/15 1855  BNP 2,045.4*    ProBNP (last 3 results)  Recent Labs  11/09/15 1455  PROBNP 1,559.0*    CBG:  Recent Labs Lab 12/31/15 1143 12/31/15 1648 12/31/15 2216 01/01/16 0725 01/01/16 1258  GLUCAP 125* 144* 147* 132* 126*    Signed:  Barton Dubois MD.  [facility] 01/01/2016, 4:45 PM

## 2016-01-01 NOTE — Progress Notes (Signed)
9 Days Post-Op  Subjective: Doing well. No c/o. No n/v. No bloody bm.   Objective: Vital signs in last 24 hours: Temp:  [97.7 F (36.5 C)-98 F (36.7 C)] 98 F (36.7 C) (11/05 [id number]) Pulse Rate:  [93-99] 93 (11/05 0613) Resp:  [18] 18 (11/05 0613) BP: (105-107)/(56-72) 105/56 (11/05 0613) SpO2:  [100 %] 100 % (11/05 [id number]) Weight:  [76.2 kg (168 lb)] 76.2 kg (168 lb) (11/05 0500) Last BM Date: 12/30/15  Intake/Output from previous day: 11/04 0701 - 11/05 0700 In: 480 [P.O.:480] Out: 200 [Urine:200] Intake/Output this shift: No intake/output data recorded.  Alert, nad cta Soft, mild distension, incision c/d/i, not really TTP  Lab Results:   Recent Labs  12/31/15 0400 01/01/16 0511  WBC 12.2* 9.9  HGB 9.2* 9.3*  HCT 26.8* 27.5*  PLT 370 399   BMET  Recent Labs  12/31/15 0731 01/01/16 0511  NA 131* 133*  K 3.5 4.9  CL 95* 98*  CO2 25 27  GLUCOSE 93 100*  BUN 84* 80*  CREATININE 1.96* 2.10*  CALCIUM 8.2* 8.5*   PT/INR No results for input(s): LABPROT, INR in the last 72 hours. ABG No results for input(s): PHART, HCO3 in the last 72 hours.  Invalid input(s): PCO2, PO2  Studies/Results: No results found.  Anti-infectives: Anti-infectives    Start     Dose/Rate Route Frequency Ordered Stop   12/23/15 1640  cefoTEtan in Dextrose 5% (CEFOTAN) 2-2.08 GM-% IVPB    Comments:  Connye Burkitt   : cabinet override      12/23/15 1640 12/24/15 0444   12/23/15 1300  cefoTEtan (CEFOTAN) 2 g in dextrose 5 % 50 mL IVPB     2 g 100 mL/hr over 30 Minutes Intravenous To [facility] 12/23/15 1245 12/23/15 1630   12/19/15 1400  metroNIDAZOLE (FLAGYL) IVPB 500 mg  Status:  Discontinued     500 mg 100 mL/hr over 60 Minutes Intravenous Every 8 hours 12/19/15 1257 12/23/15 1924   12/19/15 1400  ciprofloxacin (CIPRO) IVPB 400 mg  Status:  Discontinued     400 mg 200 mL/hr over 60 Minutes Intravenous Every 24 hours 12/19/15 1307 12/23/15 1924       Assessment/Plan: Distal SBO involving the jejunum with necrosis, but no perforation, intertwined with right Fallopian tube and ovary- only surgery status post partial hysterectomy 1972 [place] OBSTRUCTION,12/23/15 Dr. Hulen Skains History of benign neoplasm of pancreas, found with SBO 11/11/2001 History of atrial flutter;diastolic and systolic congestive heart failure(2-D echo 05/2814 with EF 50-55% and grade 2 diastolic dysfunction) Chronic kidney disease stage III  AODM Kidney disease, acute on chronic History of obstructive sleep apnea Hypertension Hypercholesterolemia FEN: full => soft diet ID: day 5 Cipro/Flagyl completed 10/27, Cefotetan preop 12/23/15 DVT:  SCD's; oral anticoag due to aflutter  cdiff negative hgb stable after conversion to oral Ok for dc from surgical point of view Discussed dc instructions with pt F/u on chart.  Would rec a cbc mid week  Leighton Ruff. Redmond Pulling, MD, FACS General, Bariatric, & Minimally Invasive Surgery [facility], [place]   LOS: 12 days    Gayland Curry 01/01/2016

## 2016-01-11 ENCOUNTER — Inpatient Hospital Stay: Payer: Medicare Other | Admitting: Internal Medicine

## 2016-01-11 ENCOUNTER — Encounter: Payer: Self-pay | Admitting: Interventional Cardiology

## 2016-01-13 ENCOUNTER — Telehealth: Payer: Self-pay | Admitting: Emergency Medicine

## 2016-01-13 ENCOUNTER — Encounter: Payer: Self-pay | Admitting: Internal Medicine

## 2016-01-13 ENCOUNTER — Ambulatory Visit (INDEPENDENT_AMBULATORY_CARE_PROVIDER_SITE_OTHER): Payer: Medicare Other | Admitting: Internal Medicine

## 2016-01-13 ENCOUNTER — Other Ambulatory Visit (INDEPENDENT_AMBULATORY_CARE_PROVIDER_SITE_OTHER): Payer: Medicare Other

## 2016-01-13 VITALS — BP 110/70 | HR 98 | Temp 97.9°F | Resp 12 | Ht 65.0 in | Wt 156.0 lb

## 2016-01-13 DIAGNOSIS — A09 Infectious gastroenteritis and colitis, unspecified: Secondary | ICD-10-CM

## 2016-01-13 DIAGNOSIS — I10 Essential (primary) hypertension: Secondary | ICD-10-CM

## 2016-01-13 DIAGNOSIS — D72828 Other elevated white blood cell count: Secondary | ICD-10-CM

## 2016-01-13 DIAGNOSIS — N179 Acute kidney failure, unspecified: Secondary | ICD-10-CM | POA: Diagnosis not present

## 2016-01-13 DIAGNOSIS — Z23 Encounter for immunization: Secondary | ICD-10-CM

## 2016-01-13 DIAGNOSIS — D509 Iron deficiency anemia, unspecified: Secondary | ICD-10-CM

## 2016-01-13 DIAGNOSIS — I5042 Chronic combined systolic (congestive) and diastolic (congestive) heart failure: Secondary | ICD-10-CM

## 2016-01-13 DIAGNOSIS — K529 Noninfective gastroenteritis and colitis, unspecified: Secondary | ICD-10-CM

## 2016-01-13 LAB — CBC
HEMATOCRIT: 31.7 % — AB (ref 36.0–46.0)
HEMOGLOBIN: 10.4 g/dL — AB (ref 12.0–15.0)
MCHC: 32.7 g/dL (ref 30.0–36.0)
MCV: 82.6 fl (ref 78.0–100.0)
Platelets: 478 10*3/uL — ABNORMAL HIGH (ref 150.0–400.0)
RBC: 3.84 Mil/uL — ABNORMAL LOW (ref 3.87–5.11)
RDW: 16.2 % — ABNORMAL HIGH (ref 11.5–15.5)
WBC: 6.1 10*3/uL (ref 4.0–10.5)

## 2016-01-13 MED ORDER — VITAMIN D (ERGOCALCIFEROL) 1.25 MG (50000 UNIT) PO CAPS: 50000.0000 [IU] | ORAL_CAPSULE | ORAL | 3 refills | Status: AC

## 2016-01-13 NOTE — Progress Notes (Signed)
   Subjective:    Patient ID: Wendy Kerr, female    DOB: 12-04-1930, 82 y.o.   MRN: [id number]  HPI The patient is an [name] coming in for hospital follow up (in for SBO with surgery and removal of 1 foot necrotic bowel). Her son is with her for follow up, he does know some PT people and they are doing her PT on their own and would not like home health. She is using walker for long distances and cane at home. She is having some mild soreness in her stomach. Moving bowels okay. Eating some but appetite is poor. No nausea.   PMH, [facility], social history reviewed and updated.   Review of Systems  Constitutional: Positive for activity change and appetite change. Negative for chills, fatigue, fever and unexpected weight change.  HENT: Negative.   Eyes: Negative.   Respiratory: Negative.   Cardiovascular: Negative.   Gastrointestinal: Positive for abdominal pain. Negative for abdominal distention, constipation, diarrhea, nausea and vomiting.  Musculoskeletal: Positive for gait problem.  Skin: Negative.       Objective:   Physical Exam  Constitutional: She is oriented to person, place, and time. She appears well-developed and well-nourished.  HENT:  Head: Normocephalic and atraumatic.  Eyes: EOM are normal.  Neck: Normal range of motion.  Cardiovascular: Normal rate and regular rhythm.   Pulmonary/Chest: Effort normal and breath sounds normal.  Abdominal: Soft. She exhibits no distension and no mass. There is tenderness. There is no rebound and no guarding.  Well healing incision midline. Mild tenderness but no rebound or guarding.   Neurological: She is alert and oriented to person, place, and time. Coordination abnormal.  Using walker at the visit.  Skin: Skin is warm and dry.   Vitals:   01/13/16 1401  BP: 110/70  Pulse: 98  Resp: 12  Temp: 97.9 F (36.6 C)  TempSrc: Oral  SpO2: 98%  Weight: 156 lb (70.8 kg)  Height: 5\' 5"  (1.651 m)      Assessment & Plan:    Pneumonia 23 given at visit.

## 2016-01-13 NOTE — Telephone Encounter (Signed)
Is she under hospice? They are usually the ones asking if we will be attending physician. She should have her surgeon giving the PT orders from her recent surgery.

## 2016-01-13 NOTE — Telephone Encounter (Signed)
[facility] called and wants to know if you can give verbal orders for PT for 2 weeks. Will you also be patients attending physician? Please advise thanks.

## 2016-01-13 NOTE — Patient Instructions (Signed)
We are checking the blood counts today and will call you back with the results (and send on mychart).  We have sent in the vitamin D.

## 2016-01-13 NOTE — Progress Notes (Signed)
Pre visit review using our clinic review tool, if applicable. No additional management support is needed unless otherwise documented below in the visit note. 

## 2016-01-14 NOTE — Assessment & Plan Note (Signed)
Resolved no more diarrhea or constipation and minimal abdominal tenderness.

## 2016-01-14 NOTE — Assessment & Plan Note (Signed)
Recent BMP with kidney specialist and will get those results but toward her baseline at the end of the hospital stay.

## 2016-01-14 NOTE — Assessment & Plan Note (Signed)
Recheck CBC and adjust as needed.

## 2016-01-14 NOTE — Assessment & Plan Note (Signed)
BP at goal and was low in the hospital. No symptoms of lightheadedness.

## 2016-01-14 NOTE — Assessment & Plan Note (Signed)
No flare today, her weight is down dramatically due to loss of appetite and recent hospital stay. Will montor and they know if 3 pound gain in 1 week to call. Diuretics reduced in the hospital due to weight loss and will keep 40 mg daily lasix.

## 2016-01-16 NOTE — Telephone Encounter (Signed)
Taken care of at last office visit.

## 2016-01-23 ENCOUNTER — Telehealth: Payer: Self-pay | Admitting: Emergency Medicine

## 2016-01-23 NOTE — Telephone Encounter (Signed)
Error

## 2016-01-24 ENCOUNTER — Encounter: Payer: Self-pay | Admitting: Internal Medicine

## 2016-01-24 ENCOUNTER — Ambulatory Visit (INDEPENDENT_AMBULATORY_CARE_PROVIDER_SITE_OTHER): Payer: Medicare Other | Admitting: Internal Medicine

## 2016-01-24 DIAGNOSIS — L299 Pruritus, unspecified: Secondary | ICD-10-CM | POA: Diagnosis not present

## 2016-01-24 MED ORDER — PREDNISONE 10 MG PO TABS: ORAL_TABLET | ORAL | 0 refills | Status: DC

## 2016-01-24 MED ORDER — METHYLPREDNISOLONE ACETATE 80 MG/ML IJ SUSP
80.0000 mg | Freq: Once | INTRAMUSCULAR | Status: AC
  Administered 2016-01-24: 80 mg via INTRAMUSCULAR

## 2016-01-24 NOTE — Progress Notes (Signed)
Pre visit review using our clinic review tool, if applicable. No additional management support is needed unless otherwise documented below in the visit note. 

## 2016-01-24 NOTE — Patient Instructions (Signed)
You had the steroid shot today  Please take all new medication as prescribed - the low dose prednisone for 5 days  Please also start OTC Claritin 10 mg or Allegra 180 mg per day  Please keep your appt with dermatology as you mentioned  Please continue all other medications as before, and refills have been done if requested.  Please have the pharmacy call with any other refills you may need.  Please keep your appointments with your specialists as you may have planned

## 2016-01-24 NOTE — Progress Notes (Signed)
Subjective:    Patient ID: Wendy Kerr, female    DOB: 11-23-1930, 82 y.o.   MRN: [id number]  HPI  Here to f/u with c/o 2 mo onset persistent itching; has hx of dry skin but not itching like this to torso primarily but also somewhat extremities.  Not much better with topical benadryl , did not try oral pills.  No specific rash or swelling.  No hx of hepatic dz.  Pt denies chest pain, increased sob or doe, wheezing, orthopnea, PND, increased LE swelling, palpitations, dizziness or syncope.  Pt denies new neurological symptoms such as new headache, or facial or extremity weakness or numbness   Pt denies polydipsia, polyuria Past Medical History:  Diagnosis Date  . 1st degree AV block   . Anemia   . Atrial flutter ([place])   . Benign neoplasm of pancreas, except islets of Langerhans   . Bronchitis, mucopurulent recurrent ([place])   . Common migraine   . Diabetes mellitus ([place])   . Diverticulosis of colon   . DJD (degenerative joint disease)   . Generalized anxiety disorder   . Hypercholesterolemia   . Hypertension   . Monoclonal gammopathy   . Obesity   . OSA (obstructive sleep apnea)    severe with AHI 37 events per hour  . Vitamin D deficiency    Past Surgical History:  Procedure Laterality Date  . ABDOMINAL HYSTERECTOMY  1972  . LAPAROTOMY N/A 12/23/2015   Procedure: EXPLORATORY LAPAROTOMY FOR SMALL BOWEL OBSTRUCTION;  Surgeon: Judeth Horn, MD;  Location: [place];  Service: General;  Laterality: N/A;  . resection serous cystadenoma of pancreas  2004   at [facility].  Now (2017) with Dr Bridgett Larsson with [facility]    reports that she has never smoked. She has never used smokeless tobacco. She reports that she does not drink alcohol or use drugs. family history includes Heart Problems (age of onset: 52) in her mother; Heart Problems (age of onset: 52) in her father; Hypertension in her father and mother; Kidney disease in her other and sister; Kidney failure in her father and mother. Allergies    Allergen Reactions  . Piroxicam Other (See Comments)    REACTION: HEADACHE   Current Outpatient Prescriptions on File Prior to Visit  Medication Sig Dispense Refill  . atorvastatin (LIPITOR) 20 MG tablet Take 1 tablet (20 mg total) by mouth daily at 6 PM. 30 tablet 6  . digoxin (LANOXIN) 0.125 MG tablet Take 0.5 tablets (0.0625 mg total) by mouth every Monday, Wednesday, and Friday. 15 tablet 5  . ELIQUIS 2.5 MG TABS tablet TAKE 1 TABLET BY MOUTH 2 TIMES DAILY. (Patient taking differently: Take 2.5 mg by mouth twice daily) 60 tablet 8  . furosemide (LASIX) 80 MG tablet Take 0.5 tablets (40 mg total) by mouth daily.    . hydrALAZINE (APRESOLINE) 25 MG tablet Take 0.5 tablets (12.5 mg total) by mouth 2 (two) times daily.    . isosorbide mononitrate (IMDUR) 60 MG 24 hr tablet Take 0.5 tablets (30 mg total) by mouth daily. 30 tablet 8  . Multiple Vitamin (MULTIVITAMIN WITH MINERALS) TABS tablet Take 1 tablet by mouth every morning.    . nitroGLYCERIN (NITROSTAT) 0.4 MG SL tablet Place 1 tablet (0.4 mg total) under the tongue every 5 (five) minutes as needed for chest pain. 5 tablet 3  . OVER THE COUNTER MEDICATION Take 1 tablet by mouth daily. MED NAME: IRON    . Vitamin D, Ergocalciferol, (DRISDOL) 50000 units CAPS  capsule Take 1 capsule (50,000 Units total) by mouth every 7 (seven) days. 12 capsule 3   No current facility-administered medications on file prior to visit.    Review of Systems  All otherwise neg per pt     Objective:   Physical Exam BP 130/72   Pulse 90   Temp 97.8 F (36.6 C) (Oral)   Resp 20   Wt 158 lb (71.7 kg)   SpO2 95%   BMI 26.29 kg/m  VS noted,  Constitutional: Pt appears in no apparent distress HENT: Head: NCAT.  Right Ear: External ear normal.  Left Ear: External ear normal.  Eyes: . Pupils are equal, round, and reactive to light. Conjunctivae and EOM are normal Neck: Normal range of motion. Neck supple.  Cardiovascular: Normal rate and regular  rhythm.   Pulmonary/Chest: Effort normal and breath sounds without rales or wheezing.  Neurological: Pt is alert. Not confused , motor grossly intact Skin: Skin is warm. No rash, no LE edema, but has some excoriations to bilat lower lumbar areas from scratching Psychiatric: Pt behavior is normal. No agitation.     Assessment & Plan:

## 2016-01-30 NOTE — Assessment & Plan Note (Signed)
Etiology unclear, pt somewhat desparate for trial of depomedrol IM 80, predpac asd, and oral claritin or allegra prn, consider derm or allergy referral

## 2016-02-07 ENCOUNTER — Telehealth: Payer: Self-pay | Admitting: Cardiology

## 2016-02-07 NOTE — Telephone Encounter (Signed)
New message  Wendy Kerr from [facility] call requesting to speak with RN to F/u on fax information. Please call back to discuss

## 2016-02-09 NOTE — Telephone Encounter (Signed)
Called and left a message for Wendy Kerr to return my call he is a [phone]

## 2016-02-13 ENCOUNTER — Other Ambulatory Visit: Payer: Self-pay | Admitting: *Deleted

## 2016-02-13 MED ORDER — ISOSORBIDE MONONITRATE ER 60 MG PO TB24: 30.0000 mg | ORAL_TABLET | Freq: Every day | ORAL | 5 refills | Status: DC

## 2016-02-13 MED ORDER — DIGOXIN 125 MCG PO TABS: 0.0625 mg | ORAL_TABLET | ORAL | 5 refills | Status: DC

## 2016-02-22 ENCOUNTER — Telehealth: Payer: Self-pay | Admitting: Internal Medicine

## 2016-02-22 NOTE — Telephone Encounter (Signed)
Pt son called in said that pt is still itching and they have been to several different dr about it and she is not any better.  What can they do?

## 2016-02-24 NOTE — Telephone Encounter (Signed)
Do you have any recommendations for patient and son or would an office visit be more appropriate.

## 2016-02-24 NOTE — Telephone Encounter (Signed)
I'm not sure that I have seen her for this so I cannot say what is causing it. She can come in for visit to discuss if she wants. Its hard to say without knowing what doctors she has seen for it and the whole story.

## 2016-02-24 NOTE — Telephone Encounter (Signed)
Spoke with patients sister who stated she will relay the message to [facility] to make an appointment to further assess the issue.

## 2016-02-25 ENCOUNTER — Other Ambulatory Visit: Payer: Self-pay | Admitting: Interventional Cardiology

## 2016-02-28 ENCOUNTER — Ambulatory Visit (INDEPENDENT_AMBULATORY_CARE_PROVIDER_SITE_OTHER): Payer: Medicare Other | Admitting: Internal Medicine

## 2016-02-28 ENCOUNTER — Encounter: Payer: Self-pay | Admitting: Internal Medicine

## 2016-02-28 DIAGNOSIS — L299 Pruritus, unspecified: Secondary | ICD-10-CM | POA: Diagnosis not present

## 2016-02-28 MED ORDER — HYDROXYZINE HCL 25 MG PO TABS: 25.0000 mg | ORAL_TABLET | Freq: Three times a day (TID) | ORAL | 0 refills | Status: DC | PRN

## 2016-02-28 NOTE — Progress Notes (Signed)
   Subjective:    Patient ID: Wendy Kerr, female    DOB: 1930-07-13, 83 y.o.   MRN: [id number]  HPI The patient is an 83 YO female coming in for itching. She has had it for the last 3-4 months and was treated at our office and did not get many answers. She has tried several things and they have not helped including several creams from the dermatologist. They were not able to tell her any answers for the itching. She also has seen an allergy specialist and gotten no answers there either. She has been trying benadryl without good relief. She is not sleeping well due to this. Saw her kidney specialist and they told her it was not related to her kidneys. No rash and she is trying not to scratch and so no skin breakdown.   Review of Systems  Constitutional: Negative.   Respiratory: Negative.   Cardiovascular: Negative.   Gastrointestinal: Negative.   Musculoskeletal: Negative.   Skin:       itching  Neurological: Negative.   Psychiatric/Behavioral: Positive for sleep disturbance.      Objective:   Physical Exam  Constitutional: She is oriented to person, place, and time. She appears well-developed and well-nourished.  HENT:  Head: Normocephalic and atraumatic.  Eyes: EOM are normal.  Cardiovascular: Normal rate and regular rhythm.   Pulmonary/Chest: Effort normal and breath sounds normal.  Abdominal: Soft.  Neurological: She is alert and oriented to person, place, and time.  Skin: Skin is warm and dry.  No rash, some stigmata of scratching on the back, arms without lesions or redness   Vitals:   02/28/16 1611  BP: 124/78  Pulse: 69  Resp: 16  Temp: 97.5 F (36.4 C)  TempSrc: Oral  SpO2: 95%  Weight: 170 lb (77.1 kg)  Height: 5\' 5"  (1.651 m)      Assessment & Plan:

## 2016-02-28 NOTE — Progress Notes (Signed)
Pre visit review using our clinic review tool, if applicable. No additional management support is needed unless otherwise documented below in the visit note. 

## 2016-02-28 NOTE — Patient Instructions (Signed)
We have sent in hydroxyzine that you can try up to 3 times a day. It can make you sleepy so try it at night first to help with the itching.  If this does not help we will have you try stopping 1 medicine for 1 week, if the itching is gone let us know which one.   If the itching continues start that medicine back again and stop a different medicine.   We can continue trying this with all your medicines to see if we can narrow down which one might be causing the itching.  It is a small possibility that the kidneys could be causing the itching as well.

## 2016-02-29 NOTE — Assessment & Plan Note (Signed)
Could be related to her CKD. She is also on several medications that could be related. Trying hydroxyzine for the itching. If no relief they will try a rotation with the medications and stop a different medication each week to see if the itching disappears.

## 2016-03-09 DIAGNOSIS — L298 Other pruritus: Secondary | ICD-10-CM | POA: Diagnosis not present

## 2016-03-13 DIAGNOSIS — E785 Hyperlipidemia, unspecified: Secondary | ICD-10-CM | POA: Diagnosis not present

## 2016-03-13 DIAGNOSIS — K862 Cyst of pancreas: Secondary | ICD-10-CM | POA: Diagnosis not present

## 2016-03-13 DIAGNOSIS — I2510 Atherosclerotic heart disease of native coronary artery without angina pectoris: Secondary | ICD-10-CM | POA: Diagnosis not present

## 2016-03-13 DIAGNOSIS — I10 Essential (primary) hypertension: Secondary | ICD-10-CM | POA: Diagnosis not present

## 2016-03-13 DIAGNOSIS — K219 Gastro-esophageal reflux disease without esophagitis: Secondary | ICD-10-CM | POA: Diagnosis not present

## 2016-03-13 DIAGNOSIS — E7800 Pure hypercholesterolemia, unspecified: Secondary | ICD-10-CM | POA: Diagnosis not present

## 2016-03-13 DIAGNOSIS — D378 Neoplasm of uncertain behavior of other specified digestive organs: Secondary | ICD-10-CM | POA: Diagnosis not present

## 2016-03-13 DIAGNOSIS — Z7984 Long term (current) use of oral hypoglycemic drugs: Secondary | ICD-10-CM | POA: Diagnosis not present

## 2016-03-13 DIAGNOSIS — L299 Pruritus, unspecified: Secondary | ICD-10-CM | POA: Diagnosis not present

## 2016-03-13 DIAGNOSIS — Z79899 Other long term (current) drug therapy: Secondary | ICD-10-CM | POA: Diagnosis not present

## 2016-03-13 DIAGNOSIS — E119 Type 2 diabetes mellitus without complications: Secondary | ICD-10-CM | POA: Diagnosis not present

## 2016-03-16 ENCOUNTER — Ambulatory Visit (INDEPENDENT_AMBULATORY_CARE_PROVIDER_SITE_OTHER): Payer: Medicare Other | Admitting: Internal Medicine

## 2016-03-16 ENCOUNTER — Encounter: Payer: Self-pay | Admitting: Internal Medicine

## 2016-03-16 DIAGNOSIS — L299 Pruritus, unspecified: Secondary | ICD-10-CM | POA: Diagnosis not present

## 2016-03-16 DIAGNOSIS — R946 Abnormal results of thyroid function studies: Secondary | ICD-10-CM | POA: Diagnosis not present

## 2016-03-16 DIAGNOSIS — R7989 Other specified abnormal findings of blood chemistry: Secondary | ICD-10-CM | POA: Insufficient documentation

## 2016-03-16 NOTE — Assessment & Plan Note (Addendum)
This is not outside the range of normal for age at [facility] 7. The free T4 is also normal which speaks against the need for any further intervention. This was discussed with her and her family during the visit. They will get the other labs faxed to [place] for review and if needed will order repeat if they are significantly different from December.

## 2016-03-16 NOTE — Assessment & Plan Note (Signed)
They will try rotating meds off to see which is the culprit or if one is the cause. We talked again about the fact that her CKD could be the cause and if so that this is hard to treat.

## 2016-03-16 NOTE — Patient Instructions (Signed)
If this does not help we will have you try stopping 1 medicine for 1-2 weeks, if the itching is gone let us know which one.   If the itching continues start that medicine back again and stop a different medicine.   We can continue trying this with all your medicines to see if we can narrow down which one might be causing the itching.  We will watch for the thyroid levels and call you back with the results.

## 2016-03-16 NOTE — Progress Notes (Signed)
Pre visit review using our clinic review tool, if applicable. No additional management support is needed unless otherwise documented below in the visit note. 

## 2016-03-16 NOTE — Progress Notes (Signed)
   Subjective:    Patient ID: Wendy Kerr, female    DOB: [birth year], 83 y.o.   MRN: [id number]  HPI The patient is an 83 YO female coming in for high TSH at allergy specialist. They also did total and free T4. The free T4 was normal. The TSH was 7 in December but they think some number was 41 in the last week with the dermatologist. She is not having other symptoms of low thyroid including constipation, weight gain. She is still having some itching which is not new. The hydroxyzine is not helping much with the itching.   Review of Systems  Constitutional: Positive for fatigue. Negative for activity change, appetite change and chills.  Respiratory: Positive for cough. Negative for chest tightness, shortness of breath and wheezing.        Chronic stable  Cardiovascular: Negative.   Musculoskeletal: Negative.   Skin: Negative for color change, pallor and rash.       Itching  Psychiatric/Behavioral: Positive for sleep disturbance. Negative for behavioral problems, decreased concentration, dysphoric mood and hallucinations. The patient is not hyperactive.       Objective:   Physical Exam  Constitutional: She is oriented to person, place, and time. She appears well-developed and well-nourished.  HENT:  Head: Normocephalic and atraumatic.  Eyes: EOM are normal.  Cardiovascular: Normal rate and regular rhythm.   Pulmonary/Chest: Effort normal and breath sounds normal.  Abdominal: Soft.  Neurological: She is alert and oriented to person, place, and time.  Skin: Skin is warm and dry.  Some stigmata of scratching.    Vitals:   03/16/16 1339  BP: 130/86  Pulse: (!) 54  Resp: 16  Weight: 172 lb (78 kg)  Height: 5\' 5"  (1.651 m)      Assessment & Plan:

## 2016-03-27 ENCOUNTER — Other Ambulatory Visit: Payer: Self-pay | Admitting: Internal Medicine

## 2016-04-17 DIAGNOSIS — E119 Type 2 diabetes mellitus without complications: Secondary | ICD-10-CM | POA: Diagnosis not present

## 2016-04-17 DIAGNOSIS — D509 Iron deficiency anemia, unspecified: Secondary | ICD-10-CM | POA: Diagnosis not present

## 2016-04-17 DIAGNOSIS — N2581 Secondary hyperparathyroidism of renal origin: Secondary | ICD-10-CM | POA: Diagnosis not present

## 2016-04-17 DIAGNOSIS — N183 Chronic kidney disease, stage 3 (moderate): Secondary | ICD-10-CM | POA: Diagnosis not present

## 2016-04-17 DIAGNOSIS — I129 Hypertensive chronic kidney disease with stage 1 through stage 4 chronic kidney disease, or unspecified chronic kidney disease: Secondary | ICD-10-CM | POA: Diagnosis not present

## 2016-04-26 ENCOUNTER — Other Ambulatory Visit: Payer: Self-pay | Admitting: Interventional Cardiology

## 2016-04-26 MED ORDER — FUROSEMIDE 80 MG PO TABS: 80.0000 mg | ORAL_TABLET | Freq: Two times a day (BID) | ORAL | 2 refills | Status: DC

## 2016-04-26 MED ORDER — DIGOXIN 125 MCG PO TABS: 0.0625 mg | ORAL_TABLET | ORAL | 2 refills | Status: DC

## 2016-05-11 DIAGNOSIS — D509 Iron deficiency anemia, unspecified: Secondary | ICD-10-CM | POA: Diagnosis not present

## 2016-05-11 DIAGNOSIS — N2581 Secondary hyperparathyroidism of renal origin: Secondary | ICD-10-CM | POA: Diagnosis not present

## 2016-05-11 DIAGNOSIS — I129 Hypertensive chronic kidney disease with stage 1 through stage 4 chronic kidney disease, or unspecified chronic kidney disease: Secondary | ICD-10-CM | POA: Diagnosis not present

## 2016-05-11 DIAGNOSIS — E119 Type 2 diabetes mellitus without complications: Secondary | ICD-10-CM | POA: Diagnosis not present

## 2016-05-11 DIAGNOSIS — N183 Chronic kidney disease, stage 3 (moderate): Secondary | ICD-10-CM | POA: Diagnosis not present

## 2016-05-16 DIAGNOSIS — L298 Other pruritus: Secondary | ICD-10-CM | POA: Diagnosis not present

## 2016-05-18 ENCOUNTER — Telehealth: Payer: Self-pay | Admitting: Internal Medicine

## 2016-05-18 DIAGNOSIS — L298 Other pruritus: Secondary | ICD-10-CM | POA: Diagnosis not present

## 2016-05-18 DIAGNOSIS — L299 Pruritus, unspecified: Secondary | ICD-10-CM | POA: Diagnosis not present

## 2016-05-18 NOTE — Telephone Encounter (Signed)
Can try tussin over the counter.

## 2016-05-18 NOTE — Telephone Encounter (Signed)
Pts son called and said that the pt has a bad cough that started last night and has continued through the day and wanted to know if you could send something in for her today to [facility] in [place].  Please advise. Thanks  [employer]

## 2016-05-18 NOTE — Telephone Encounter (Signed)
Contacted son and he stated awareness

## 2016-05-21 DIAGNOSIS — L298 Other pruritus: Secondary | ICD-10-CM | POA: Diagnosis not present

## 2016-05-23 ENCOUNTER — Telehealth: Payer: Self-pay | Admitting: Internal Medicine

## 2016-05-23 DIAGNOSIS — L298 Other pruritus: Secondary | ICD-10-CM | POA: Diagnosis not present

## 2016-05-28 ENCOUNTER — Ambulatory Visit: Payer: Medicare Other | Admitting: Internal Medicine

## 2016-05-28 DIAGNOSIS — L298 Other pruritus: Secondary | ICD-10-CM | POA: Diagnosis not present

## 2016-05-30 ENCOUNTER — Ambulatory Visit (INDEPENDENT_AMBULATORY_CARE_PROVIDER_SITE_OTHER): Payer: Medicare Other | Admitting: Internal Medicine

## 2016-05-30 ENCOUNTER — Other Ambulatory Visit (INDEPENDENT_AMBULATORY_CARE_PROVIDER_SITE_OTHER): Payer: Medicare Other

## 2016-05-30 ENCOUNTER — Encounter: Payer: Self-pay | Admitting: Internal Medicine

## 2016-05-30 VITALS — BP 130/70 | HR 78 | Temp 98.3°F | Resp 14 | Ht 65.0 in | Wt 158.0 lb

## 2016-05-30 DIAGNOSIS — R946 Abnormal results of thyroid function studies: Secondary | ICD-10-CM

## 2016-05-30 DIAGNOSIS — R7989 Other specified abnormal findings of blood chemistry: Secondary | ICD-10-CM

## 2016-05-30 DIAGNOSIS — L298 Other pruritus: Secondary | ICD-10-CM | POA: Diagnosis not present

## 2016-05-30 LAB — TSH: TSH: 10.64 u[IU]/mL — AB (ref 0.35–4.50)

## 2016-05-30 LAB — T4, FREE: Free T4: 0.84 ng/dL (ref 0.60–1.60)

## 2016-05-30 MED ORDER — BENZONATATE 200 MG PO CAPS: 200.0000 mg | ORAL_CAPSULE | Freq: Two times a day (BID) | ORAL | 0 refills | Status: DC | PRN

## 2016-05-30 NOTE — Progress Notes (Signed)
Pre visit review using our clinic review tool, if applicable. No additional management support is needed unless otherwise documented below in the visit note. 

## 2016-05-30 NOTE — Patient Instructions (Signed)
We are checking the labs today and will send in thyroid medicine if needed.

## 2016-05-31 ENCOUNTER — Ambulatory Visit: Payer: Medicare Other | Admitting: Internal Medicine

## 2016-05-31 ENCOUNTER — Other Ambulatory Visit: Payer: Self-pay | Admitting: Internal Medicine

## 2016-05-31 MED ORDER — LEVOTHYROXINE SODIUM 25 MCG PO TABS: 25.0000 ug | ORAL_TABLET | Freq: Every day | ORAL | 0 refills | Status: DC

## 2016-06-01 DIAGNOSIS — L298 Other pruritus: Secondary | ICD-10-CM | POA: Diagnosis not present

## 2016-06-01 NOTE — Assessment & Plan Note (Signed)
Recehecking TSH and free T4 level today and talked with them about the fact that her levels have been normal twice recently and likely this was a falsely high test. If TSH abnormal and T4 levels abnormal can start replacement but I highly doubt that this is causing her itching. I think part of this is related to her kidney function and rising BUN as well as just cyclic with her scratching and activating her histamine system.

## 2016-06-01 NOTE — Progress Notes (Signed)
   Subjective:    Patient ID: Wendy Kerr, female    DOB: July 28, 1930, 83 y.o.   MRN: [id number]  HPI The patient is an 83 YO female coming in for abnormal labs from dermatology. She has been having itching which is persistent for the last 6 months or so. She has seen many providers about this and no clear explanation has been found. Thyroid levels have been normal several times but her new dermatologist has checked them again recently and TSH was 27 and total T4 was slightly low. She is doing UV light treatments for the itching which is helping mildly. We have discussed with her that this could be related to poor kidney function however her kidney specialist does not agree with that. They are looking for a solution.   Review of Systems  Constitutional: Positive for activity change and appetite change. Negative for chills, fatigue, fever and unexpected weight change.  Respiratory: Negative.   Cardiovascular: Negative.   Gastrointestinal: Negative.   Musculoskeletal: Negative.   Skin:       Itching, not much rash  Neurological: Negative.       Objective:   Physical Exam  Constitutional: She is oriented to person, place, and time. She appears well-developed and well-nourished.  Mild distress with itching during the exam  HENT:  Head: Normocephalic and atraumatic.  Cardiovascular: Normal rate and regular rhythm.   Pulmonary/Chest: Effort normal and breath sounds normal.  Abdominal: Soft.  Musculoskeletal: She exhibits no edema.  Neurological: She is alert and oriented to person, place, and time.  Skin: Skin is warm and dry. No rash noted.  Some stigmata of scratching but no true rash on exam   Vitals:   05/30/16 1124  BP: 130/70  Pulse: 78  Resp: 14  Temp: 98.3 F (36.8 C)  TempSrc: Oral  SpO2: 99%  Weight: 158 lb (71.7 kg)  Height: 5\' 5"  (1.651 m)      Assessment & Plan:

## 2016-06-04 DIAGNOSIS — L298 Other pruritus: Secondary | ICD-10-CM | POA: Diagnosis not present

## 2016-06-06 ENCOUNTER — Telehealth: Payer: Self-pay | Admitting: Internal Medicine

## 2016-06-06 DIAGNOSIS — L298 Other pruritus: Secondary | ICD-10-CM | POA: Diagnosis not present

## 2016-06-06 NOTE — Telephone Encounter (Signed)
Dr Ubaldo Glassing called wanting to speak with you regarding this mutual patient. She said that when you have a chance to call her, if she is in a room with a pt, they can ask for her to come out and speak with you. She said that you can reach her at [phone] or if that does not work you can try [phone].

## 2016-06-07 NOTE — Telephone Encounter (Signed)
Tried calling back both numbers and both went through to voicemail. Left message for Dr. Ubaldo Glassing to call back if wanted.

## 2016-06-07 NOTE — Telephone Encounter (Signed)
Dr Ubaldo Glassing called back to speak with you but you had already left. She said that she tried to call back but was not able to get through. She said that if you are able to call tomorrow (06/08/16) between 8 and 12 she should be available. The [phone] phone number is the nurse's station so there may have not been anyone at the phone at the time that you called.

## 2016-06-08 DIAGNOSIS — L298 Other pruritus: Secondary | ICD-10-CM | POA: Diagnosis not present

## 2016-06-08 NOTE — Telephone Encounter (Signed)
I would recommend that she call back at her convenience since she wants to talk to me.

## 2016-06-11 DIAGNOSIS — N183 Chronic kidney disease, stage 3 (moderate): Secondary | ICD-10-CM | POA: Diagnosis not present

## 2016-06-11 DIAGNOSIS — L298 Other pruritus: Secondary | ICD-10-CM | POA: Diagnosis not present

## 2016-06-13 ENCOUNTER — Telehealth: Payer: Self-pay | Admitting: Internal Medicine

## 2016-06-13 DIAGNOSIS — L298 Other pruritus: Secondary | ICD-10-CM | POA: Diagnosis not present

## 2016-06-13 NOTE — Telephone Encounter (Signed)
Pt's son called stating that per Dr Sharlet Salina, she recommended that the pts medication may need to be changed. He would like for this to be done as soon as possible. 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  He also asked that a lab order be put in to check her iron level. He said that she has been very tired lately and that in the past her iron was low and required infusions. The pt's son can be reached at [phone]  Please advise.

## 2016-06-14 DIAGNOSIS — H2513 Age-related nuclear cataract, bilateral: Secondary | ICD-10-CM | POA: Diagnosis not present

## 2016-06-14 NOTE — Telephone Encounter (Signed)
I'm not sure which medication they are talking about. At last visit they declined PT, do they want to go to PT somewhere, if so where?

## 2016-06-14 NOTE — Telephone Encounter (Signed)
Spoke with pt's son. He said that at her January 19th visit something was mentioned about changing medications to see if any of them were causing the itching. He was not sure which ones you were wanting to change.  He said that he is going to call her insurance company to see who would be in network for PT. He will call back and let us know.

## 2016-06-14 NOTE — Telephone Encounter (Signed)
Please inform

## 2016-06-15 DIAGNOSIS — L298 Other pruritus: Secondary | ICD-10-CM | POA: Diagnosis not present

## 2016-06-16 ENCOUNTER — Encounter: Payer: Self-pay | Admitting: Internal Medicine

## 2016-06-16 ENCOUNTER — Ambulatory Visit (INDEPENDENT_AMBULATORY_CARE_PROVIDER_SITE_OTHER): Payer: Medicare Other | Admitting: Internal Medicine

## 2016-06-16 VITALS — BP 126/78 | HR 77 | Temp 98.1°F | Resp 16 | Wt 158.0 lb

## 2016-06-16 DIAGNOSIS — R059 Cough, unspecified: Secondary | ICD-10-CM | POA: Insufficient documentation

## 2016-06-16 DIAGNOSIS — R05 Cough: Secondary | ICD-10-CM

## 2016-06-16 MED ORDER — AMOXICILLIN 500 MG PO TABS: 1000.0000 mg | ORAL_TABLET | Freq: Two times a day (BID) | ORAL | 0 refills | Status: DC

## 2016-06-16 NOTE — Assessment & Plan Note (Signed)
For weeks Similar spell in spring last year--but doesn't really know of allergies Likely low level sinus infection at this point Will treat with amoxil Supportive Rx

## 2016-06-16 NOTE — Progress Notes (Signed)
Subjective:    Patient ID: Wendy Kerr, female    DOB: 1930-07-01, 83 y.o.   MRN: [id number]  HPI Here due to persistent cough  Cough is dry and hacky for some time--- even before her surgery last fall (though may have been better for a while) Had similar problem last year Will have paroxysms for 10-15 minutes Occasionally brings up some sputum Benzonatate for 2 weeks--not really helping  No fever Never smoked No SOB No problems with allergies that she knows of  Current Outpatient Prescriptions on File Prior to Visit  Medication Sig Dispense Refill  . atorvastatin (LIPITOR) 20 MG tablet Take 1 tablet (20 mg total) by mouth daily at 6 PM. 30 tablet 6  . benzonatate (TESSALON) 200 MG capsule Take 1 capsule (200 mg total) by mouth 2 (two) times daily as needed for cough. 60 capsule 0  . digoxin (LANOXIN) 0.125 MG tablet Take 0.5 tablets (0.0625 mg total) by mouth every Monday, Wednesday, and Friday. 45 tablet 2  . ELIQUIS 2.5 MG TABS tablet TAKE 1 TABLET BY MOUTH 2 TIMES DAILY. (Patient taking differently: Take 2.5 mg by mouth twice daily) 60 tablet 8  . furosemide (LASIX) 80 MG tablet Take 1 tablet (80 mg total) by mouth 2 (two) times daily. (Patient taking differently: Take 80 mg by mouth 2 (two) times daily. 40mg  in AM and 40mg  in PM) 180 tablet 2  . hydrALAZINE (APRESOLINE) 25 MG tablet Take 0.5 tablets (12.5 mg total) by mouth 2 (two) times daily.    . hydrOXYzine (ATARAX/VISTARIL) 25 MG tablet TAKE 1 TABLET (25 MG TOTAL) BY MOUTH 3 (THREE) TIMES DAILY AS NEEDED. 90 tablet 0  . isosorbide mononitrate (IMDUR) 60 MG 24 hr tablet Take 0.5 tablets (30 mg total) by mouth daily. 15 tablet 5  . levothyroxine (SYNTHROID) 25 MCG tablet Take 1 tablet (25 mcg total) by mouth daily before breakfast. 90 tablet 0  . Multiple Vitamin (MULTIVITAMIN WITH MINERALS) TABS tablet Take 1 tablet by mouth every morning.    . nitroGLYCERIN (NITROSTAT) 0.4 MG SL tablet Place 1 tablet (0.4 mg total)  under the tongue every 5 (five) minutes as needed for chest pain. 5 tablet 3  . OVER THE COUNTER MEDICATION Take 1 tablet by mouth daily. MED NAME: IRON    . Vitamin D, Ergocalciferol, (DRISDOL) 50000 units CAPS capsule Take 1 capsule (50,000 Units total) by mouth every 7 (seven) days. 12 capsule 3   No current facility-administered medications on file prior to visit.     Allergies  Allergen Reactions  . Piroxicam Other (See Comments)    REACTION: HEADACHE    Past Medical History:  Diagnosis Date  . 1st degree AV block   . Anemia   . Atrial flutter ([place])   . Benign neoplasm of pancreas, except islets of Langerhans   . Bronchitis, mucopurulent recurrent ([place])   . Common migraine   . Diabetes mellitus ([place])   . Diverticulosis of colon   . DJD (degenerative joint disease)   . Generalized anxiety disorder   . Hypercholesterolemia   . Hypertension   . Monoclonal gammopathy   . Obesity   . OSA (obstructive sleep apnea)    severe with AHI 37 events per hour  . Vitamin D deficiency     Past Surgical History:  Procedure Laterality Date  . ABDOMINAL HYSTERECTOMY  1972  . LAPAROTOMY N/A 12/23/2015   Procedure: EXPLORATORY LAPAROTOMY FOR SMALL BOWEL OBSTRUCTION;  Surgeon: Judeth Horn, MD;  Location: [facility];  Service: General;  Laterality: N/A;  . resection serous cystadenoma of pancreas  2004   at [facility].  Now (2017) with Dr Bridgett Larsson with [facility]    Family History  Problem Relation Age of Onset  . Hypertension Mother   . Kidney failure Mother     dialysis  . Heart Problems Mother 25  . Hypertension Father   . Kidney failure Father     dialysis  . Heart Problems Father 98  . Kidney disease Sister   . Kidney disease Other     Social History   Social History  . Marital status: Single    Spouse name: N/A  . Number of children: 2  . Years of education: N/A   Occupational History  . 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 Smoking  status: Never Smoker  . Smokeless tobacco: Never Used  . Alcohol use No  . Drug use: No  . Sexual activity: Not on file   Other Topics Concern  . 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  Ambulates independently.   Review of Systems Chronic itching No headache No N;V Appetite is slightly off    Objective:   Physical Exam  Constitutional: She appears well-nourished. No distress.  occ coarse cough  HENT:  Mouth/Throat: Oropharynx is clear and moist. No oropharyngeal exudate.  No sinus tenderness TMs normal Moderate nasal inflammation--some thick mucus on left  Neck: No thyromegaly present.  Pulmonary/Chest: Effort normal and breath sounds normal. No respiratory distress. She has no wheezes. She has no rales.  Lymphadenopathy:    She has no cervical adenopathy.          Assessment & Plan:

## 2016-06-20 DIAGNOSIS — L298 Other pruritus: Secondary | ICD-10-CM | POA: Diagnosis not present

## 2016-06-21 ENCOUNTER — Telehealth: Payer: Self-pay | Admitting: Internal Medicine

## 2016-06-21 DIAGNOSIS — I129 Hypertensive chronic kidney disease with stage 1 through stage 4 chronic kidney disease, or unspecified chronic kidney disease: Secondary | ICD-10-CM | POA: Diagnosis not present

## 2016-06-21 DIAGNOSIS — D509 Iron deficiency anemia, unspecified: Secondary | ICD-10-CM | POA: Diagnosis not present

## 2016-06-21 DIAGNOSIS — N2581 Secondary hyperparathyroidism of renal origin: Secondary | ICD-10-CM | POA: Diagnosis not present

## 2016-06-21 DIAGNOSIS — N184 Chronic kidney disease, stage 4 (severe): Secondary | ICD-10-CM | POA: Diagnosis not present

## 2016-06-21 DIAGNOSIS — H2513 Age-related nuclear cataract, bilateral: Secondary | ICD-10-CM | POA: Diagnosis not present

## 2016-06-21 DIAGNOSIS — E119 Type 2 diabetes mellitus without complications: Secondary | ICD-10-CM | POA: Diagnosis not present

## 2016-06-21 NOTE — Telephone Encounter (Signed)
Cough generally lasts for 2-3 weeks after an illness passes. Is she having problems with breathing? Otherwise she likely does not need to see pulmonary. You can schedule her a follow up visit here if still sick.

## 2016-06-21 NOTE — Telephone Encounter (Signed)
The pts son called stating that the pt had an appointment with an eye doctor and will need to have cataract surgery. The surgery can not be done due to a persistent cough that the pt has. She was here to see Dr Silvio Pate on Saturday and he prescribed her amoxacillin. The cough has not approved. The eye doctor recommended that she see a pulmonologist. The pts son wanted to know if a referral could be put in for her to see pulmonary. Please advise.

## 2016-06-22 DIAGNOSIS — H35363 Drusen (degenerative) of macula, bilateral: Secondary | ICD-10-CM | POA: Diagnosis not present

## 2016-06-22 DIAGNOSIS — E119 Type 2 diabetes mellitus without complications: Secondary | ICD-10-CM | POA: Diagnosis not present

## 2016-06-22 DIAGNOSIS — H43813 Vitreous degeneration, bilateral: Secondary | ICD-10-CM | POA: Diagnosis not present

## 2016-06-22 LAB — HM DIABETES EYE EXAM

## 2016-06-22 NOTE — Telephone Encounter (Signed)
The cough could be coming from lungs, heart, reflux, and other reasons. We cannot just send her to a pulmonary doctor because this would be a waste of time if not coming from a lung cause. She needs a visit.

## 2016-06-22 NOTE — Telephone Encounter (Signed)
Will you call and see if they want to schedule a follow up visit, im not 100%sure how to schedule

## 2016-06-22 NOTE — Telephone Encounter (Signed)
Routing to dr Sharlet Salina, I have talked with patient's son---he states dr ridby is very worried about his mothers' cough---he could not even conduct the eye vision appt for patient coughing to point of throwing up--dr rigby states patient needs to be seen by lung specialist, he can't take out cataracts with a non-stop cough, patient is also itching non-stop---son is tired of coming in and trying this and trying that, and nothing is helping, he wants urgent referral to pulmonary----please advise, I will call patient's son back

## 2016-06-22 NOTE — Telephone Encounter (Signed)
Patient's son advised, we have scheduled appt with dr Charlett Blake on Saturday clinic tomorrow----patient can only come at 11am on 4/28---she is seeing so many specialists and is currently at [place] today, will not be able to get back into town before 11am tomorrow

## 2016-06-22 NOTE — Telephone Encounter (Signed)
The pts son said that this is the same cough that has been going on for over a year but has gotten worse. He said that there have been many times that she will start coughing and has a hard time getting it to stop. The eye doctor recommended a pulmonary referral. He said that this has been discussed with Dr Sharlet Salina in the past.

## 2016-06-23 ENCOUNTER — Ambulatory Visit: Payer: Medicare Other | Admitting: Family Medicine

## 2016-06-25 DIAGNOSIS — L298 Other pruritus: Secondary | ICD-10-CM | POA: Diagnosis not present

## 2016-06-26 ENCOUNTER — Encounter: Payer: Self-pay | Admitting: Internal Medicine

## 2016-06-26 ENCOUNTER — Ambulatory Visit (INDEPENDENT_AMBULATORY_CARE_PROVIDER_SITE_OTHER): Payer: Medicare Other | Admitting: Internal Medicine

## 2016-06-26 ENCOUNTER — Other Ambulatory Visit (INDEPENDENT_AMBULATORY_CARE_PROVIDER_SITE_OTHER): Payer: Medicare Other

## 2016-06-26 VITALS — BP 124/70 | HR 78 | Temp 97.8°F | Resp 12 | Ht 65.0 in | Wt 160.0 lb

## 2016-06-26 DIAGNOSIS — R05 Cough: Secondary | ICD-10-CM

## 2016-06-26 DIAGNOSIS — R946 Abnormal results of thyroid function studies: Secondary | ICD-10-CM | POA: Diagnosis not present

## 2016-06-26 DIAGNOSIS — L299 Pruritus, unspecified: Secondary | ICD-10-CM | POA: Diagnosis not present

## 2016-06-26 DIAGNOSIS — R7989 Other specified abnormal findings of blood chemistry: Secondary | ICD-10-CM

## 2016-06-26 DIAGNOSIS — R059 Cough, unspecified: Secondary | ICD-10-CM

## 2016-06-26 LAB — T4, FREE: Free T4: 0.65 ng/dL (ref 0.60–1.60)

## 2016-06-26 LAB — BRAIN NATRIURETIC PEPTIDE: PRO B NATRI PEPTIDE: 3417 pg/mL — AB (ref 0.0–100.0)

## 2016-06-26 LAB — TSH: TSH: 10.05 u[IU]/mL — ABNORMAL HIGH (ref 0.35–4.50)

## 2016-06-26 MED ORDER — TORSEMIDE 20 MG PO TABS: 20.0000 mg | ORAL_TABLET | Freq: Two times a day (BID) | ORAL | 3 refills | Status: DC

## 2016-06-26 NOTE — Progress Notes (Signed)
   Subjective:    Patient ID: Wendy Kerr, female    DOB: 1931-02-17, 83 y.o.   MRN: [id number]  HPI The patient is an 83 YO female coming in for cough. Started about 6-12 months ago and has been seen several times for it. She was given an antibiotic about 2 weeks ago which she finished which did not seem to help much with the cough. She is having coughing fits which last about 30 seconds. They came to the eye doctor to get a cataract procedure and they would not do this because of her coughing with the eye exam. She is having a lot more bloating in her stomach which is uncomfortable. She is taking her fluid pill and has been up and down on weight. Not eating as much due to her itching which is still present. Denies GERD symptoms and denies significant allergic rhinitis at this time.  She is still also having problems with itching. She has started the thyroid medication about 4 weeks ago and has not noticed any difference. She has not tried rotating meds off to see if a medication is the cause as recommended. Her recent creatinine from nephrology last week was worse with creatinine 3.7 and they think GFR 14. She has been CKD stage 4 close to stage 5 for some time.   Review of Systems  Constitutional: Positive for activity change, appetite change and fatigue. Negative for chills, fever and unexpected weight change.  HENT: Negative.   Eyes: Negative.   Respiratory: Positive for cough. Negative for chest tightness, shortness of breath and wheezing.   Cardiovascular: Negative for chest pain, palpitations and leg swelling.  Gastrointestinal: Positive for abdominal distention and nausea. Negative for abdominal pain, constipation, diarrhea and vomiting.  Musculoskeletal: Negative.   Skin: Positive for rash.       itching  Neurological: Positive for weakness. Negative for dizziness, seizures, facial asymmetry, speech difficulty and numbness.  Psychiatric/Behavioral: Positive for agitation, decreased  concentration and dysphoric mood. Negative for self-injury, sleep disturbance and suicidal ideas. The patient is not nervous/anxious.       Objective:   Physical Exam  Constitutional: She is oriented to person, place, and time. She appears well-developed and well-nourished.  HENT:  Head: Normocephalic and atraumatic.  Eyes: EOM are normal.  Neck: Normal range of motion.  Cardiovascular: Normal rate.   Pulmonary/Chest: Effort normal. No respiratory distress. She has no wheezes. She has rales.  Bilateral rales with crackles  Abdominal: Soft. Bowel sounds are normal. She exhibits distension. There is no tenderness. There is no rebound and no guarding.  Large distention not present 3 weeks prior  Neurological: She is alert and oriented to person, place, and time. Coordination normal.  Skin: Skin is warm and dry.  Stigmata of scratching, no macular lesions  Psychiatric:  Distraction during the visit   Vitals:   06/26/16 1348  BP: 124/70  Pulse: 78  Resp: 12  Temp: 97.8 F (36.6 C)  TempSrc: Oral  SpO2: 98%  Weight: 160 lb (72.6 kg)  Height: 5\' 5"  (1.651 m)      Assessment & Plan:

## 2016-06-26 NOTE — Progress Notes (Signed)
Pre visit review using our clinic review tool, if applicable. No additional management support is needed unless otherwise documented below in the visit note. 

## 2016-06-27 ENCOUNTER — Encounter: Payer: Self-pay | Admitting: Internal Medicine

## 2016-06-27 ENCOUNTER — Ambulatory Visit (INDEPENDENT_AMBULATORY_CARE_PROVIDER_SITE_OTHER)
Admission: RE | Admit: 2016-06-27 | Discharge: 2016-06-27 | Disposition: A | Discharge status: Home / Self Care | Payer: Medicare Other | Source: Ambulatory Visit | Attending: Internal Medicine | Admitting: Internal Medicine

## 2016-06-27 ENCOUNTER — Telehealth: Payer: Self-pay | Admitting: Interventional Cardiology

## 2016-06-27 DIAGNOSIS — R05 Cough: Secondary | ICD-10-CM

## 2016-06-27 DIAGNOSIS — L400 Psoriasis vulgaris: Secondary | ICD-10-CM | POA: Diagnosis not present

## 2016-06-27 DIAGNOSIS — R059 Cough, unspecified: Secondary | ICD-10-CM

## 2016-06-27 NOTE — Telephone Encounter (Signed)
Patient son calling, states that patient's stomach is swollen. Patient's son states that her stomach looks exactly like it did about 1 year ago when she was hospitalized to have fluid removed. Wendy Kerr states that his mother has CHF and believes that she needs to be worked in within a day or two. Please call to discuss the next steps for treatment. Thanks.

## 2016-06-27 NOTE — Assessment & Plan Note (Signed)
Checking TSH and free T4 on synthroid 25 mcg daily after about 4 weeks of therapy.

## 2016-06-27 NOTE — Progress Notes (Unsigned)
Results entered and sent to scan  

## 2016-06-27 NOTE — Assessment & Plan Note (Signed)
Suspect weight loss from not eating and fluid overload with weights relatively stable. Checking BNP and CXR. She will change lasix to torsemide (she thinks may be causing the itching) and call back with weight in 2-3 days. She does have large ascites on exam today and if no relief with coughing will see if we can arrange to get this drained for relief of her symptoms. She did not cough once during the visit today however is having coughing spells which could be caused as well from irritation of the diaphragm from the ascites.

## 2016-06-27 NOTE — Telephone Encounter (Signed)
Spoke with son (DPR) earlier and he said pt is having some swelling in her abd similar to what she had a year ago and she was hospitalized at that time for diuresis.  Pt occasionally has SOB which seems to have worsened over the last 3 months.  Denies pain.  Swelling is also occurring in legs.  Pt has lost weight due to decreased appetite according to son.  Spoke with Dr. Tamala Julian and he said to have pt increase Torsemide to 40mg  BID x 2 days and have pt see him on Friday and have BMET at that time.  Advised son of these recommendations.  Son verbalized understanding and was in agreement with this plan.

## 2016-06-27 NOTE — Assessment & Plan Note (Signed)
Again we have gone over the plan to rotate different medications off to see if one of them is causing the itching as most of the systemic causes have been evaluated. We talked again about how this could be related to her higher BUN which can cause itching. She has tried hydroxyzine and benadryl which do not provide much relief. She is seeing dermatology and they are trying UV light which provides only minimal relief. She is now taking synthroid 25 mcg daily to see if lowering her TSH will help and checking labs today. She has not noticed a change yet.

## 2016-06-28 ENCOUNTER — Other Ambulatory Visit: Payer: Self-pay | Admitting: *Deleted

## 2016-06-28 DIAGNOSIS — N185 Chronic kidney disease, stage 5: Secondary | ICD-10-CM

## 2016-06-28 DIAGNOSIS — Z01810 Encounter for preprocedural cardiovascular examination: Secondary | ICD-10-CM

## 2016-06-29 ENCOUNTER — Telehealth: Payer: Self-pay | Admitting: Internal Medicine

## 2016-06-29 ENCOUNTER — Ambulatory Visit (INDEPENDENT_AMBULATORY_CARE_PROVIDER_SITE_OTHER): Payer: Medicare Other | Admitting: Interventional Cardiology

## 2016-06-29 ENCOUNTER — Encounter: Payer: Self-pay | Admitting: Interventional Cardiology

## 2016-06-29 VITALS — BP 118/62 | HR 80 | Ht 65.0 in | Wt 154.0 lb

## 2016-06-29 DIAGNOSIS — L298 Other pruritus: Secondary | ICD-10-CM | POA: Diagnosis not present

## 2016-06-29 DIAGNOSIS — I484 Atypical atrial flutter: Secondary | ICD-10-CM

## 2016-06-29 DIAGNOSIS — I10 Essential (primary) hypertension: Secondary | ICD-10-CM | POA: Diagnosis not present

## 2016-06-29 DIAGNOSIS — N184 Chronic kidney disease, stage 4 (severe): Secondary | ICD-10-CM | POA: Diagnosis not present

## 2016-06-29 DIAGNOSIS — L400 Psoriasis vulgaris: Secondary | ICD-10-CM | POA: Diagnosis not present

## 2016-06-29 DIAGNOSIS — I5042 Chronic combined systolic (congestive) and diastolic (congestive) heart failure: Secondary | ICD-10-CM

## 2016-06-29 LAB — BASIC METABOLIC PANEL
BUN/Creatinine Ratio: 25 (ref 12–28)
BUN: 96 mg/dL — AB (ref 8–27)
CALCIUM: 9 mg/dL (ref 8.7–10.3)
CHLORIDE: 100 mmol/L (ref 96–106)
CO2: 10 mmol/L — AB (ref 18–29)
Creatinine, Ser: 3.8 mg/dL — ABNORMAL HIGH (ref 0.57–1.00)
GFR calc non Af Amer: 10 mL/min/{1.73_m2} — ABNORMAL LOW (ref >59)
GFR, EST AFRICAN AMERICAN: 12 mL/min/{1.73_m2} — AB (ref >59)
GLUCOSE: 109 mg/dL — AB (ref 65–99)
POTASSIUM: 3.6 mmol/L (ref 3.5–5.2)
Sodium: 137 mmol/L (ref 134–144)

## 2016-06-29 MED ORDER — LEVOTHYROXINE SODIUM 50 MCG PO TABS: 50.0000 ug | ORAL_TABLET | Freq: Every day | ORAL | 0 refills | Status: DC

## 2016-06-29 NOTE — Telephone Encounter (Signed)
Please advise about swelling in abdomin

## 2016-06-29 NOTE — Progress Notes (Signed)
Cardiology Office Note    Date:  06/29/2016   ID:  Wendy Kerr, Wendy Kerr Nov 16, 1930, MRN [id number]  PCP:  Hoyt Koch, MD  Cardiologist: Sinclair Grooms, MD   Chief Complaint  Patient presents with  . Congestive Heart Failure    History of Present Illness:  Wendy Kerr is a 83 y.o. female chronic anemia, chronic kidney disease stage III IV, hypertension, suspected underlying coronary artery disease, type 2 diabetes mellitus, Elevated TSH, obstructive sleep apnea, relatively recent small bowel obstruction and adrenal mass and presumed benign pancreatic lesion noted on CT.   Her son called because of concern about increasing abdominal girth. The patient is recently seen Dr. Sherrie Sport (nephrology) and Dr. Sharlet Salina (primary care). Competing recommendations have been made concerning her diuretic regimen. The patient denies shortness of breath but does have cough. Chest x-ray done within the past 3 days does not demonstrate congestion. BNP is 3000. Most recent creatinine is 3.3 with a BUN of 95 done by Dr. Lorrene Reid on 06/21/2016. She denies chest pain, tachycardia, lower extremity swelling, orthopnea, and PND.  The patient is suffering from pruritus. She is not sleeping well. She has a chronic cough. She denies orthopnea.  Past Medical History:  Diagnosis Date  . 1st degree AV block   . Anemia   . Atrial flutter ([place])   . Benign neoplasm of pancreas, except islets of Langerhans   . Bronchitis, mucopurulent recurrent ([place])   . Common migraine   . Diabetes mellitus ([place])   . Diverticulosis of colon   . DJD (degenerative joint disease)   . Generalized anxiety disorder   . Hypercholesterolemia   . Hypertension   . Monoclonal gammopathy   . Obesity   . OSA (obstructive sleep apnea)    severe with AHI 37 events per hour  . Vitamin D deficiency     Past Surgical History:  Procedure Laterality Date  . ABDOMINAL HYSTERECTOMY  1972  . LAPAROTOMY N/A 12/23/2015   Procedure: EXPLORATORY LAPAROTOMY FOR SMALL BOWEL OBSTRUCTION;  Surgeon: Judeth Horn, MD;  Location: [place];  Service: General;  Laterality: N/A;  . resection serous cystadenoma of pancreas  2004   at [facility].  Now (2017) with Dr Bridgett Larsson with [facility]    Current Medications: Outpatient Medications Prior to Visit  Medication Sig Dispense Refill  . atorvastatin (LIPITOR) 20 MG tablet Take 1 tablet (20 mg total) by mouth daily at 6 PM. 30 tablet 6  . ELIQUIS 2.5 MG TABS tablet TAKE 1 TABLET BY MOUTH 2 TIMES DAILY. (Patient taking differently: Take 2.5 mg by mouth twice daily) 60 tablet 8  . hydrALAZINE (APRESOLINE) 25 MG tablet Take 0.5 tablets (12.5 mg total) by mouth 2 (two) times daily.    . hydrOXYzine (ATARAX/VISTARIL) 25 MG tablet TAKE 1 TABLET (25 MG TOTAL) BY MOUTH 3 (THREE) TIMES DAILY AS NEEDED. 90 tablet 0  . isosorbide mononitrate (IMDUR) 60 MG 24 hr tablet Take 0.5 tablets (30 mg total) by mouth daily. 15 tablet 5  . Multiple Vitamin (MULTIVITAMIN WITH MINERALS) TABS tablet Take 1 tablet by mouth every morning.    . nitroGLYCERIN (NITROSTAT) 0.4 MG SL tablet Place 1 tablet (0.4 mg total) under the tongue every 5 (five) minutes as needed for chest pain. 5 tablet 3  . OVER THE COUNTER MEDICATION Take 1 tablet by mouth daily. MED NAME: IRON    . torsemide (DEMADEX) 20 MG tablet Take 1 tablet (20 mg total) by mouth 2 (two)  times daily. 60 tablet 3  . Vitamin D, Ergocalciferol, (DRISDOL) 50000 units CAPS capsule Take 1 capsule (50,000 Units total) by mouth every 7 (seven) days. 12 capsule 3  . digoxin (LANOXIN) 0.125 MG tablet Take 0.5 tablets (0.0625 mg total) by mouth every Monday, Wednesday, and Friday. 45 tablet 2  . levothyroxine (SYNTHROID) 25 MCG tablet Take 1 tablet (25 mcg total) by mouth daily before breakfast. 90 tablet 0   No facility-administered medications prior to visit.      Allergies:   Piroxicam   Social History   Social History  . Marital status: Single    Spouse name:  N/A  . Number of children: 2  . Years of education: N/A   Occupational History  . 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 Smoking status: Never Smoker  . Smokeless tobacco: Never Used  . Alcohol use No  . Drug use: No  . Sexual activity: Not Asked   Other Topics Concern  . 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  Ambulates independently.     Family History:  The patient's family history includes Heart Problems (age of onset: 1) in her mother; Heart Problems (age of onset: 31) in her father; Hypertension in her father and mother; Kidney disease in her other and sister; Kidney failure in her father and mother.   ROS:   Please see the history of present illness.    Weight loss, fatigue, increased abdominal girth, difficulty with vision, cough, pruritus, diarrhea, joint swelling.  All other systems reviewed and are negative.   PHYSICAL EXAM:   VS:  BP 118/62   Pulse 80   Ht 5\' 5"  (1.651 m)   Wt 154 lb (69.9 kg)   BMI 25.63 kg/m    GEN: Well nourished, well developed, in no acute distress  HEENT: normal  Neck: no JVD, carotid bruits, or masses Cardiac: RRR; no murmurs, rubs,. There is a loud S4 gallop sounds like an S3 due to prolonged PR interval. There is 2+ bilateral leg edema.  Respiratory:  clear to auscultation bilaterally, normal work of breathing GI: soft, nontender, nondistended, + BS MS: no deformity or atrophy  Skin: warm and dry, no rash Neuro:  Alert and Oriented x 3, Strength and sensation are intact Psych: euthymic mood, full affect  Wt Readings from Last 3 Encounters:  06/29/16 154 lb (69.9 kg)  06/26/16 160 lb (72.6 kg)  06/16/16 158 lb (71.7 kg)      Studies/Labs Reviewed:   EKG:  EKG  Sinus rhythm with long first grade AV block at 298 ms. Right bundle branch block with left axis deviation. There is no change compared to prior tracings.  Recent  Labs: 12/18/2015: ALT 27 12/22/2015: B Natriuretic Peptide 2,045.4 12/24/2015: Magnesium 2.7 01/01/2016: BUN 80; Creatinine, Ser 2.10; Potassium 4.9; Sodium 133 01/13/2016: Hemoglobin 10.4; Platelets 478.0 06/26/2016: Pro B Natriuretic peptide (BNP) 3,417.0; TSH 10.05   Lipid Panel    Component Value Date/Time   CHOL 185 07/30/2012 1052   TRIG 119.0 07/30/2012 1052   HDL 59.70 07/30/2012 1052   CHOLHDL 3 07/30/2012 1052   VLDL 23.8 07/30/2012 1052   LDLCALC 102 (H) 07/30/2012 1052   LDLDIRECT 142.5 08/11/2007 1021    Additional studies/ records that were reviewed today include:  2-D Doppler echocardiogram September 2017: Study Conclusions  - Left ventricle: The cavity size was  normal. There was severe   concentric hypertrophy. Systolic function was mildly to   moderately reduced. The estimated ejection fraction was in the   range of 40% to 45%. Wall motion was normal; there were no   regional wall motion abnormalities. The study is not technically   sufficient to allow evaluation of LV diastolic function. Doppler   parameters are consistent with high ventricular filling pressure. - Mitral valve: There was mild regurgitation. - Right atrium: The atrium was moderately dilated. - Tricuspid valve: There was moderate regurgitation. - Pulmonary arteries: PA peak pressure: 55 mm Hg (S). - Pericardium, extracardiac: A small, free-flowing pericardial   effusion was identified posterior to the heart. The fluid had no   internal echoes.  Impressions:  - The right ventricular systolic pressure was increased consistent   with moderate pulmonary hypertension.   Chest x-ray 06/27/16: IMPRESSION: Cardiomegaly.  No active disease  ASSESSMENT:    1. Chronic combined systolic and diastolic heart failure ([place])   2. Essential hypertension   3. Atypical atrial flutter ([place])   4. Chronic kidney disease, stage IV (severe) ([facility])      PLAN:  In order of problems listed  above:  1. Despite elevated BNP there is no pulmonary congestion. Patient does have evidence of right heart failure. Given the current significant kidney impairment, adjustment in the diuretic regimen will be left to nephrology. The most recent nephrology recommendation was metolazone 3 times per week, 2.5 mg. Furosemide 80 mg twice a day. This was via Dr. Jamal Maes. 2-3 days ago Dr. Sharlet Salina changed her diuretic from furosemide to Demadex 20 mg twice a day. I believe her diuretic regimen needs to be controlled by Dr. Lorrene Reid. 2. Not a concern 3. Normal sinus rhythm is present on EKG. There is marked prolongation of the PR interval. Lanoxin is discontinued. 4. Significant progression of kidney impairment. Management per Dr. Lorrene Reid and the nephrology service.  TSH could be related to hypothyroidism although kidney disease and chronic illness could confound this diagnosis. Plan increase Levoxyl to 50 g per day. She will need to have TSH performed in 4-6 weeks by Dr. Sharlet Salina. I will plan to see the patient again if she develops evidence of frank heart failure. Currently there is no evidence to support this.    Medication Adjustments/Labs and Tests Ordered: Current medicines are reviewed at length with the patient today.  Concerns regarding medicines are outlined above.  Medication changes, Labs and Tests ordered today are listed in the Patient Instructions below. Patient Instructions  Medication Instructions:  1) INCREASE your Synthroid to 34mcg once daily. 2) DISCONTINUE Digoxin.  Labwork: BMET today.  Have Dr. Sharlet Salina draw a TSH level in 6 weeks.    Testing/Procedures: None  Follow-Up: Your physician wants you to follow-up in: 6 months or as needed with Dr. Tamala Julian.  You will receive a reminder letter in the mail two months in advance. If you don't receive a letter, please call our office to schedule the follow-up appointment.   Any Other Special Instructions Will Be Listed Below  (If Applicable).    If you need a refill on your cardiac medications before your next appointment, please call your pharmacy.      Signed, Sinclair Grooms, MD  06/29/2016 11:39 AM    [facility] [address], [place], [place]  [address] Phone: [phone]; Fax: [phone]

## 2016-06-29 NOTE — Patient Instructions (Signed)
Medication Instructions:  1) INCREASE your Synthroid to 46mcg once daily. 2) DISCONTINUE Digoxin.  Labwork: BMET today.  Have Dr. Sharlet Salina draw a TSH level in 6 weeks.    Testing/Procedures: None  Follow-Up: Your physician wants you to follow-up in: 6 months or as needed with Dr. Tamala Julian.  You will receive a reminder letter in the mail two months in advance. If you don't receive a letter, please call our office to schedule the follow-up appointment.   Any Other Special Instructions Will Be Listed Below (If Applicable).    If you need a refill on your cardiac medications before your next appointment, please call your pharmacy.

## 2016-06-29 NOTE — Telephone Encounter (Signed)
Is her cough any better? This is what we were aiming for not just the swelling in abdomen and short of draining the abdomen the fluid change will not likely decrease this significantly.

## 2016-06-29 NOTE — Telephone Encounter (Signed)
Son called in requesting results of chest CT.  Gave son provider response.  Son requesting call back b/c patient is still having swelling in abdomin.

## 2016-07-02 NOTE — Telephone Encounter (Signed)
Tried call son, no answer, will call again later

## 2016-07-04 ENCOUNTER — Telehealth: Payer: Self-pay | Admitting: Internal Medicine

## 2016-07-04 ENCOUNTER — Other Ambulatory Visit (HOSPITAL_COMMUNITY): Payer: Self-pay | Admitting: Nephrology

## 2016-07-04 DIAGNOSIS — R2681 Unsteadiness on feet: Secondary | ICD-10-CM

## 2016-07-04 DIAGNOSIS — R1900 Intra-abdominal and pelvic swelling, mass and lump, unspecified site: Secondary | ICD-10-CM

## 2016-07-04 NOTE — Telephone Encounter (Signed)
Please advise 

## 2016-07-04 NOTE — Telephone Encounter (Signed)
Pts son called and said that they would like to use [facility] for PT on her knees. He wanted to know if this order could be sent in for her. Please advise.

## 2016-07-05 ENCOUNTER — Ambulatory Visit (HOSPITAL_COMMUNITY)
Admission: RE | Admit: 2016-07-05 | Discharge: 2016-07-05 | Disposition: A | Discharge status: Home / Self Care | Payer: Medicare Other | Source: Ambulatory Visit | Attending: Nephrology | Admitting: Nephrology

## 2016-07-05 DIAGNOSIS — K7469 Other cirrhosis of liver: Secondary | ICD-10-CM | POA: Diagnosis not present

## 2016-07-05 DIAGNOSIS — R1900 Intra-abdominal and pelvic swelling, mass and lump, unspecified site: Secondary | ICD-10-CM | POA: Diagnosis present

## 2016-07-05 DIAGNOSIS — D136 Benign neoplasm of pancreas: Secondary | ICD-10-CM | POA: Diagnosis not present

## 2016-07-05 DIAGNOSIS — K869 Disease of pancreas, unspecified: Secondary | ICD-10-CM | POA: Diagnosis not present

## 2016-07-05 DIAGNOSIS — D649 Anemia, unspecified: Secondary | ICD-10-CM | POA: Diagnosis not present

## 2016-07-05 DIAGNOSIS — R188 Other ascites: Secondary | ICD-10-CM | POA: Diagnosis not present

## 2016-07-05 DIAGNOSIS — K7460 Unspecified cirrhosis of liver: Secondary | ICD-10-CM | POA: Diagnosis not present

## 2016-07-05 DIAGNOSIS — D631 Anemia in chronic kidney disease: Secondary | ICD-10-CM | POA: Diagnosis not present

## 2016-07-05 DIAGNOSIS — E1122 Type 2 diabetes mellitus with diabetic chronic kidney disease: Secondary | ICD-10-CM | POA: Diagnosis not present

## 2016-07-05 NOTE — Telephone Encounter (Signed)
Placed.

## 2016-07-09 ENCOUNTER — Encounter: Payer: Self-pay | Admitting: Vascular Surgery

## 2016-07-09 DIAGNOSIS — N185 Chronic kidney disease, stage 5: Secondary | ICD-10-CM | POA: Diagnosis not present

## 2016-07-09 DIAGNOSIS — E119 Type 2 diabetes mellitus without complications: Secondary | ICD-10-CM | POA: Diagnosis not present

## 2016-07-09 DIAGNOSIS — I120 Hypertensive chronic kidney disease with stage 5 chronic kidney disease or end stage renal disease: Secondary | ICD-10-CM | POA: Diagnosis not present

## 2016-07-09 DIAGNOSIS — D509 Iron deficiency anemia, unspecified: Secondary | ICD-10-CM | POA: Diagnosis not present

## 2016-07-09 DIAGNOSIS — N2581 Secondary hyperparathyroidism of renal origin: Secondary | ICD-10-CM | POA: Diagnosis not present

## 2016-07-11 ENCOUNTER — Encounter: Payer: Self-pay | Admitting: *Deleted

## 2016-07-11 ENCOUNTER — Ambulatory Visit (HOSPITAL_COMMUNITY)
Admission: RE | Admit: 2016-07-11 | Discharge: 2016-07-11 | Disposition: A | Discharge status: Home / Self Care | Payer: Medicare Other | Source: Ambulatory Visit | Attending: Vascular Surgery | Admitting: Vascular Surgery

## 2016-07-11 ENCOUNTER — Encounter: Payer: Self-pay | Admitting: Vascular Surgery

## 2016-07-11 ENCOUNTER — Ambulatory Visit (INDEPENDENT_AMBULATORY_CARE_PROVIDER_SITE_OTHER)
Admission: RE | Admit: 2016-07-11 | Discharge: 2016-07-11 | Disposition: A | Discharge status: Home / Self Care | Payer: Medicare Other | Source: Ambulatory Visit | Attending: Surgery | Admitting: Surgery

## 2016-07-11 ENCOUNTER — Ambulatory Visit (INDEPENDENT_AMBULATORY_CARE_PROVIDER_SITE_OTHER): Payer: Medicare Other | Admitting: Vascular Surgery

## 2016-07-11 ENCOUNTER — Other Ambulatory Visit: Payer: Self-pay | Admitting: *Deleted

## 2016-07-11 VITALS — BP 101/70 | HR 82 | Temp 97.1°F | Resp 16 | Ht 65.0 in | Wt 163.0 lb

## 2016-07-11 DIAGNOSIS — Z01810 Encounter for preprocedural cardiovascular examination: Secondary | ICD-10-CM | POA: Diagnosis not present

## 2016-07-11 DIAGNOSIS — N185 Chronic kidney disease, stage 5: Secondary | ICD-10-CM | POA: Diagnosis not present

## 2016-07-11 DIAGNOSIS — N184 Chronic kidney disease, stage 4 (severe): Secondary | ICD-10-CM | POA: Diagnosis not present

## 2016-07-11 NOTE — Progress Notes (Signed)
Patient name: Wendy Kerr MRN: [id number] DOB: 12/31/1930 Sex: female   REASON FOR CONSULT:    Evaluate for hemodialysis access. Referred by Dr. Jamal Maes.  HPI:   Wendy Kerr is a 83 y.o. female, He was referred for evaluation for hemodialysis access. I have reviewed the office note that was sent with the patient from the encounter on 06/21/2016. This patient has had hypertension since age 37. She also has diabetes. She has multiple other medical conditions including obstructive sleep apnea, paroxysmal atrial fibrillation on chronic anticoagulation, and combined systolic and diastolic heart failure. GFR most recently was 14.  According to the records that were sent with the patient, our instructions were to place an AV fistula. If this were not possible we were to wait on placement of an AV graft.  The patient denies any recent uremic symptoms. Specifically she denies nausea, vomiting, fatigue, or anorexia.  Past Medical History:  Diagnosis Date  . 1st degree AV block   . Anemia   . Atrial flutter ([place])   . Benign neoplasm of pancreas, except islets of Langerhans   . Bronchitis, mucopurulent recurrent ([place])   . Common migraine   . Diabetes mellitus ([place])   . Diverticulosis of colon   . DJD (degenerative joint disease)   . Generalized anxiety disorder   . Hypercholesterolemia   . Hypertension   . Monoclonal gammopathy   . Obesity   . OSA (obstructive sleep apnea)    severe with AHI 37 events per hour  . Vitamin D deficiency     Family History  Problem Relation Age of Onset  . Hypertension Mother   . Kidney failure Mother        dialysis  . Heart Problems Mother 67  . Hypertension Father   . Kidney failure Father        dialysis  . Heart Problems Father 67  . Kidney disease Sister   . Kidney disease Other     SOCIAL HISTORY: Social History   Social History  . Marital status: Single    Spouse name: N/A  . Number of children: 2  . Years of  education: N/A   Occupational History  . 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 Smoking status: Never Smoker  . Smokeless tobacco: Never Used  . Alcohol use No  . Drug use: No  . Sexual activity: Not on file   Other Topics Concern  . 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  Ambulates independently.    Allergies  Allergen Reactions  . Piroxicam Other (See Comments)    REACTION: HEADACHE    Current Outpatient Prescriptions  Medication Sig Dispense Refill  . atorvastatin (LIPITOR) 20 MG tablet Take 1 tablet (20 mg total) by mouth daily at 6 PM. 30 tablet 6  . ELIQUIS 2.5 MG TABS tablet TAKE 1 TABLET BY MOUTH 2 TIMES DAILY. (Patient taking differently: Take 2.5 mg by mouth twice daily) 60 tablet 8  . hydrALAZINE (APRESOLINE) 25 MG tablet Take 0.5 tablets (12.5 mg total) by mouth 2 (two) times daily.    . hydrOXYzine (ATARAX/VISTARIL) 25 MG tablet TAKE 1 TABLET (25 MG TOTAL) BY MOUTH 3 (THREE) TIMES DAILY AS NEEDED. 90 tablet 0  . isosorbide mononitrate (IMDUR) 60 MG 24 hr tablet Take 0.5 tablets (30 mg total) by mouth daily. 15 tablet  5  . levothyroxine (SYNTHROID) 50 MCG tablet Take 1 tablet (50 mcg total) by mouth daily before breakfast. 90 tablet 0  . Multiple Vitamin (MULTIVITAMIN WITH MINERALS) TABS tablet Take 1 tablet by mouth every morning.    . nitroGLYCERIN (NITROSTAT) 0.4 MG SL tablet Place 1 tablet (0.4 mg total) under the tongue every 5 (five) minutes as needed for chest pain. 5 tablet 3  . OVER THE COUNTER MEDICATION Take 1 tablet by mouth daily. MED NAME: IRON    . torsemide (DEMADEX) 20 MG tablet Take 1 tablet (20 mg total) by mouth 2 (two) times daily. 60 tablet 3  . Vitamin D, Ergocalciferol, (DRISDOL) 50000 units CAPS capsule Take 1 capsule (50,000 Units total) by mouth every 7 (seven) days. 12 capsule 3   No current facility-administered medications for this visit.      REVIEW OF SYSTEMS:  [X]  denotes positive finding, [ ]  denotes negative finding Cardiac  Comments:  Chest pain or chest pressure:    Shortness of breath upon exertion:    Short of breath when lying flat:    Irregular heart rhythm: X       Vascular    Pain in calf, thigh, or hip brought on by ambulation:    Pain in feet at night that wakes you up from your sleep:     Blood clot in your veins:    Leg swelling:  X       Pulmonary    Oxygen at home:    Productive cough:  X   Wheezing:         Neurologic    Sudden weakness in arms or legs:     Sudden numbness in arms or legs:     Sudden onset of difficulty speaking or slurred speech:    Temporary loss of vision in one eye:     Problems with dizziness:         Gastrointestinal    Blood in stool:     Vomited blood:         Genitourinary    Burning when urinating:     Blood in urine:        Psychiatric    Major depression:         Hematologic    Bleeding problems:    Problems with blood clotting too easily:        Skin    Rashes or ulcers:        Constitutional    Fever or chills:     PHYSICAL EXAM:   Vitals:   07/11/16 0959  BP: 101/70  Pulse: 82  Resp: 16  Temp: 97.1 F (36.2 C)  TempSrc: Oral  SpO2: 97%  Weight: 163 lb (73.9 kg)  Height: 5\' 5"  (1.651 m)    GENERAL: The patient is a well-nourished female, in no acute distress. The vital signs are documented above. CARDIAC: There is a regular rate and rhythm.  VASCULAR: I do not detect carotid bruits. I cannot palpate pedal pulses. PULMONARY: There is good air exchange bilaterally without wheezing or rales. ABDOMEN: Slightly distended. MUSCULOSKELETAL: There are no major deformities or cyanosis. NEUROLOGIC: No focal weakness or paresthesias are detected. SKIN: There is a superficial ulceration on the left second toe. PSYCHIATRIC: The patient has a normal affect.  DATA:    UPPER EXTREMITY ARTERIAL DUPLEX: I have independently interpreted her  upper extremity arterial duplex scan today.  On the right side there is a triphasic radial and ulnar signal.  The brachial artery is 0.58 cm in diameter.  On the left side there is a triphasic radial and ulnar signal. The brachial artery on the left is 0.54 cm in diameter.  UPPER EXTREMITY VEIN MAP: I have been apparently interpreted her upper extremity vein map.  On the right side the forearm and upper arm cephalic vein looked reasonable in size. The basilic vein is marginal.    On the left side the upper arm cephalic vein is marginal in size. The basilic vein is marginal.  MEDICAL ISSUES:   STAGE IV CHRONIC KIDNEY DISEASE: Based on the vein map I think her best option for a fistula would be a right radiocephalic or right brachiocephalic fistula. We have been asked not to place a graft if a fistula is not possible. We will hold her Eliquis for 48 hours prior to the procedure. She does have a superficial ulceration on the left second toe and if this progresses she would need a workup for peripheral vascular disease. Obviously she's not a good candidate for an arteriogram given her renal insufficiency. In addition she is 27 with multiple medical comorbidities. We have reviewed the indications for placement of an AV fistula and the potential complications of the procedure. She is agreeable to proceed. This has been scheduled for 07/13/2016.  Deitra Mayo [facility] [place] [phone]

## 2016-07-12 ENCOUNTER — Encounter (HOSPITAL_COMMUNITY): Payer: Self-pay | Admitting: *Deleted

## 2016-07-12 NOTE — Anesthesia Preprocedure Evaluation (Addendum)
Anesthesia Evaluation  Patient identified by MRN, date of birth, ID band Patient awake    Reviewed: Allergy & Precautions, NPO status , Patient's Chart, lab work & pertinent test results  History of Anesthesia Complications Negative for: history of anesthetic complications  Airway Mallampati: II  TM Distance: >3 FB Neck ROM: Full    Dental  (+) Edentulous Upper, Dental Advisory Given   Pulmonary sleep apnea ,    Pulmonary exam normal        Cardiovascular hypertension, +CHF  Normal cardiovascular exam+ dysrhythmias Atrial Fibrillation   Study Conclusions  - Left ventricle: The cavity size was normal. There was severe   concentric hypertrophy. Systolic function was mildly to   moderately reduced. The estimated ejection fraction was in the   range of 40% to 45%. Wall motion was normal; there were no   regional wall motion abnormalities   Neuro/Psych Anxiety negative neurological ROS     GI/Hepatic Neg liver ROS, GERD  ,Bowel obstruction   Endo/Other  diabetes  Renal/GU Renal InsufficiencyRenal disease     Musculoskeletal  (+) Arthritis ,   Abdominal (+) - obese,   Peds  Hematology  (+) anemia ,   Anesthesia Other Findings   Reproductive/Obstetrics                            Anesthesia Physical  Anesthesia Plan  ASA: III  Anesthesia Plan: MAC   Post-op Pain Management:    Induction: Intravenous  Airway Management Planned: Natural Airway and Simple Face Mask  Additional Equipment:   Intra-op Plan:   Post-operative Plan:   Informed Consent: I have reviewed the patients History and Physical, chart, labs and discussed the procedure including the risks, benefits and alternatives for the proposed anesthesia with the patient or authorized representative who has indicated his/her understanding and acceptance.   Dental advisory given  Plan Discussed with: CRNA, Anesthesiologist  and Surgeon  Anesthesia Plan Comments:        Anesthesia Quick Evaluation

## 2016-07-12 NOTE — Progress Notes (Signed)
Pt denies SOB and chest pain. Pt under the care of Dr. Daneen Schick, Cardiology. Pt denies having a cardiac cath. Pt made aware to stop taking vitamins, fish oil and herbal medications. Do not take any NSAIDs ie: Ibuprofen, Advil, Naproxen, BC and Goody Powder. Pt stated that her last dose of Eliquis was Tuesday, 07/11/16. Pt stated that she rarely checks her BG when providing pre-op diabetes protocol instructions. Pt verbalized understanding of all pre-op instructions. Anesthesia asked to review pt history ( SEE NOTE).

## 2016-07-12 NOTE — Progress Notes (Signed)
Anesthesia Chart Review:  Pt is a same day work up.   Pt is an 83 year old female scheduled for R AV fistula creation on 07/13/2016 with Deitra Mayo, M.D.  - PCP is Pricilla Holm, M.D. - Cardiologist is Daneen Schick, M.D. - Nephrologist is Jamal Maes, M.D.  [facility] includes: CHF, atrial flutter, HTN, DM, hyperlipidemia, OSA, monoclonal gammopathy, first-degree AV block, anemia. Never smoker. BMI 27. S/P exploratory laparoscopy for SBO 12/23/15.  Medications include: Lipitor, Eliquis, hydralazine, iron, Imdur, levothyroxine, torsemide. Patient stopped Eliquis 07/11/16.  Labs will be obtained DOS.  CXR 06/27/16: Cardiomegaly. No active disease.  EKG 06/29/16: Sinus rhythm with first-degree AV block with occasional PVCs. LAD. Nonspecific intraventricular block. Inferior infarct, age undetermined. Anterolateral infarct, age undetermined.  Echo 11/20/15: - Left ventricle: The cavity size was normal. There was severe concentric hypertrophy. Systolic function was mildly to moderately reduced. The estimated ejection fraction was in the range of 40% to 45%. Wall motion was normal; there were no regional wall motion abnormalities. The study is not technically sufficient to allow evaluation of LV diastolic function. Doppler parameters are consistent with high ventricular filling pressure. - Mitral valve: There was mild regurgitation. - Right atrium: The atrium was moderately dilated. - Tricuspid valve: There was moderate regurgitation. - Pulmonary arteries: PA peak pressure: 55 mm Hg (S). - Pericardium, extracardiac: A small, free-flowing pericardial effusion was identified posterior to the heart. The fluid had no internal echoes. - Impressions: The right ventricular systolic pressure was increased consistent with moderate pulmonary hypertension.  Holter monitor 07/05/14:    Continuous atrial fibrillation   Average heart rate 85 bpm.   Fastest heart rate 115 bpm.   Needs better rate  control.  Nuclear stress test 06/23/14: - Intermediate risk study because of moderately reduced EF (33%). No evidence for ischemia or infarction by perfusion images.  If labs acceptable DOS, I anticipate patient can proceed as scheduled.  Willeen Cass, FNP-BC [facility]/Anesthesiology Phone: [phone] 07/12/2016 2:17 PM

## 2016-07-13 ENCOUNTER — Ambulatory Visit (HOSPITAL_COMMUNITY): Payer: Medicare Other | Admitting: Emergency Medicine

## 2016-07-13 ENCOUNTER — Encounter (HOSPITAL_COMMUNITY)
Admission: RE | Disposition: A | Discharge status: Home / Self Care | Payer: Self-pay | Source: Ambulatory Visit | Attending: Vascular Surgery

## 2016-07-13 ENCOUNTER — Encounter (HOSPITAL_COMMUNITY): Payer: Self-pay | Admitting: *Deleted

## 2016-07-13 ENCOUNTER — Ambulatory Visit (HOSPITAL_COMMUNITY)
Admission: RE | Admit: 2016-07-13 | Discharge: 2016-07-13 | Disposition: A | Discharge status: Home / Self Care | Payer: Medicare Other | Source: Ambulatory Visit | Attending: Vascular Surgery | Admitting: Vascular Surgery

## 2016-07-13 DIAGNOSIS — G4733 Obstructive sleep apnea (adult) (pediatric): Secondary | ICD-10-CM | POA: Insufficient documentation

## 2016-07-13 DIAGNOSIS — E7800 Pure hypercholesterolemia, unspecified: Secondary | ICD-10-CM | POA: Diagnosis not present

## 2016-07-13 DIAGNOSIS — Z79899 Other long term (current) drug therapy: Secondary | ICD-10-CM | POA: Insufficient documentation

## 2016-07-13 DIAGNOSIS — Z7901 Long term (current) use of anticoagulants: Secondary | ICD-10-CM | POA: Insufficient documentation

## 2016-07-13 DIAGNOSIS — N184 Chronic kidney disease, stage 4 (severe): Secondary | ICD-10-CM | POA: Insufficient documentation

## 2016-07-13 DIAGNOSIS — E559 Vitamin D deficiency, unspecified: Secondary | ICD-10-CM | POA: Insufficient documentation

## 2016-07-13 DIAGNOSIS — I480 Paroxysmal atrial fibrillation: Secondary | ICD-10-CM | POA: Insufficient documentation

## 2016-07-13 DIAGNOSIS — I5040 Unspecified combined systolic (congestive) and diastolic (congestive) heart failure: Secondary | ICD-10-CM | POA: Insufficient documentation

## 2016-07-13 DIAGNOSIS — Z8249 Family history of ischemic heart disease and other diseases of the circulatory system: Secondary | ICD-10-CM | POA: Insufficient documentation

## 2016-07-13 DIAGNOSIS — I5042 Chronic combined systolic (congestive) and diastolic (congestive) heart failure: Secondary | ICD-10-CM | POA: Diagnosis not present

## 2016-07-13 DIAGNOSIS — I129 Hypertensive chronic kidney disease with stage 1 through stage 4 chronic kidney disease, or unspecified chronic kidney disease: Secondary | ICD-10-CM | POA: Diagnosis not present

## 2016-07-13 DIAGNOSIS — Z841 Family history of disorders of kidney and ureter: Secondary | ICD-10-CM | POA: Diagnosis not present

## 2016-07-13 DIAGNOSIS — I130 Hypertensive heart and chronic kidney disease with heart failure and stage 1 through stage 4 chronic kidney disease, or unspecified chronic kidney disease: Secondary | ICD-10-CM | POA: Insufficient documentation

## 2016-07-13 DIAGNOSIS — E1122 Type 2 diabetes mellitus with diabetic chronic kidney disease: Secondary | ICD-10-CM | POA: Diagnosis not present

## 2016-07-13 DIAGNOSIS — E119 Type 2 diabetes mellitus without complications: Secondary | ICD-10-CM | POA: Diagnosis not present

## 2016-07-13 HISTORY — DX: Heart failure, unspecified: I50.9

## 2016-07-13 HISTORY — DX: Gastro-esophageal reflux disease without esophagitis: K21.9

## 2016-07-13 HISTORY — DX: Chronic kidney disease, unspecified: N18.9

## 2016-07-13 HISTORY — DX: Unspecified cirrhosis of liver: K74.60

## 2016-07-13 HISTORY — DX: Unspecified cataract: H26.9

## 2016-07-13 HISTORY — PX: AV FISTULA PLACEMENT: SHX1204

## 2016-07-13 LAB — PROTIME-INR
INR: 1.24
PROTHROMBIN TIME: 15.6 s — AB (ref 11.4–15.2)

## 2016-07-13 LAB — POCT I-STAT 4, (NA,K, GLUC, HGB,HCT)
Glucose, Bld: 95 mg/dL (ref 65–99)
HCT: 40 % (ref 36.0–46.0)
Hemoglobin: 13.6 g/dL (ref 12.0–15.0)
POTASSIUM: 4.7 mmol/L (ref 3.5–5.1)
Sodium: 133 mmol/L — ABNORMAL LOW (ref 135–145)

## 2016-07-13 LAB — GLUCOSE, CAPILLARY
GLUCOSE-CAPILLARY: 133 mg/dL — AB (ref 65–99)
Glucose-Capillary: 114 mg/dL — ABNORMAL HIGH (ref 65–99)

## 2016-07-13 SURGERY — ARTERIOVENOUS (AV) FISTULA CREATION
Anesthesia: Monitor Anesthesia Care | Site: Arm Lower | Laterality: Right

## 2016-07-13 MED ORDER — LIDOCAINE 2% (20 MG/ML) 5 ML SYRINGE
INTRAMUSCULAR | Status: AC
  Filled 2016-07-13: qty 5

## 2016-07-13 MED ORDER — ONDANSETRON HCL 4 MG/2ML IJ SOLN
INTRAMUSCULAR | Status: DC | PRN
  Administered 2016-07-13: 4 mg via INTRAVENOUS

## 2016-07-13 MED ORDER — HEPARIN SODIUM (PORCINE) 1000 UNIT/ML IJ SOLN
INTRAMUSCULAR | Status: DC | PRN
  Administered 2016-07-13: 6000 [IU] via INTRAVENOUS

## 2016-07-13 MED ORDER — PROPOFOL 10 MG/ML IV BOLUS
INTRAVENOUS | Status: AC
  Filled 2016-07-13: qty 20

## 2016-07-13 MED ORDER — PROPOFOL 500 MG/50ML IV EMUL
INTRAVENOUS | Status: DC | PRN
  Administered 2016-07-13: 25 ug/kg/min via INTRAVENOUS

## 2016-07-13 MED ORDER — OXYCODONE-ACETAMINOPHEN 5-325 MG PO TABS: 1.0000 | ORAL_TABLET | Freq: Four times a day (QID) | ORAL | 0 refills | Status: DC | PRN

## 2016-07-13 MED ORDER — CHLORHEXIDINE GLUCONATE 4 % EX LIQD: 60.0000 mL | Freq: Once | CUTANEOUS | Status: DC

## 2016-07-13 MED ORDER — 0.9 % SODIUM CHLORIDE (POUR BTL) OPTIME
TOPICAL | Status: DC | PRN
Start: 2016-07-13 — End: 2016-07-13
  Administered 2016-07-13: 1000 mL

## 2016-07-13 MED ORDER — SODIUM CHLORIDE 0.9 % IV SOLN
INTRAVENOUS | Status: DC | PRN
  Administered 2016-07-13: 08:00:00 via INTRAVENOUS

## 2016-07-13 MED ORDER — HEPARIN SODIUM (PORCINE) 1000 UNIT/ML IJ SOLN
INTRAMUSCULAR | Status: AC
  Filled 2016-07-13: qty 1

## 2016-07-13 MED ORDER — FENTANYL CITRATE (PF) 250 MCG/5ML IJ SOLN
INTRAMUSCULAR | Status: AC
Start: 2016-07-13 — End: ?
  Filled 2016-07-13: qty 5

## 2016-07-13 MED ORDER — DEXTROSE 5 % IV SOLN
1.5000 g | INTRAVENOUS | Status: AC
  Administered 2016-07-13: 1.5 g via INTRAVENOUS
  Filled 2016-07-13: qty 1.5

## 2016-07-13 MED ORDER — DEXTROSE 5 % IV SOLN
INTRAVENOUS | Status: DC | PRN
  Administered 2016-07-13: 08:00:00 via INTRAVENOUS

## 2016-07-13 MED ORDER — HYDROMORPHONE HCL 1 MG/ML IJ SOLN: 0.2500 mg | INTRAMUSCULAR | Status: DC | PRN

## 2016-07-13 MED ORDER — THROMBIN 20000 UNITS EX SOLR
CUTANEOUS | Status: AC
  Filled 2016-07-13: qty 20000

## 2016-07-13 MED ORDER — HEPARIN SODIUM (PORCINE) 5000 UNIT/ML IJ SOLN
INTRAMUSCULAR | Status: DC | PRN
  Administered 2016-07-13: 500 mL

## 2016-07-13 MED ORDER — PROTAMINE SULFATE 10 MG/ML IV SOLN
INTRAVENOUS | Status: DC | PRN
  Administered 2016-07-13: 30 mg via INTRAVENOUS

## 2016-07-13 MED ORDER — LIDOCAINE HCL 1 % IJ SOLN
INTRAMUSCULAR | Status: AC
  Filled 2016-07-13: qty 40

## 2016-07-13 MED ORDER — ONDANSETRON HCL 4 MG/2ML IJ SOLN
INTRAMUSCULAR | Status: AC
  Filled 2016-07-13: qty 2

## 2016-07-13 MED ORDER — SODIUM CHLORIDE 0.9 % IV SOLN
INTRAVENOUS | Status: DC
  Administered 2016-07-13: 08:00:00 via INTRAVENOUS
  Administered 2016-07-13: 500 mL via INTRAVENOUS

## 2016-07-13 MED ORDER — LIDOCAINE HCL (PF) 1 % IJ SOLN
INTRAMUSCULAR | Status: DC | PRN
  Administered 2016-07-13: 3 mL via SUBCUTANEOUS

## 2016-07-13 MED ORDER — LIDOCAINE HCL (CARDIAC) 20 MG/ML IV SOLN
INTRAVENOUS | Status: DC | PRN
  Administered 2016-07-13: 40 mg via INTRATRACHEAL

## 2016-07-13 MED ORDER — PROTAMINE SULFATE 10 MG/ML IV SOLN
INTRAVENOUS | Status: AC
  Filled 2016-07-13: qty 5

## 2016-07-13 MED ORDER — PROMETHAZINE HCL 25 MG/ML IJ SOLN
INTRAMUSCULAR | Status: AC
  Filled 2016-07-13: qty 1

## 2016-07-13 MED ORDER — FENTANYL CITRATE (PF) 100 MCG/2ML IJ SOLN
INTRAMUSCULAR | Status: DC | PRN
  Administered 2016-07-13 (×2): 50 ug via INTRAVENOUS
  Administered 2016-07-13: 100 ug via INTRAVENOUS
  Administered 2016-07-13: 50 ug via INTRAVENOUS

## 2016-07-13 MED ORDER — PROMETHAZINE HCL 25 MG/ML IJ SOLN
6.2500 mg | INTRAMUSCULAR | Status: DC | PRN
  Administered 2016-07-13: 6.25 mg via INTRAVENOUS

## 2016-07-13 SURGICAL SUPPLY — 37 items
ARMBAND PINK RESTRICT EXTREMIT (MISCELLANEOUS) ×3 IMPLANT
CANISTER SUCT 3000ML PPV (MISCELLANEOUS) ×3 IMPLANT
CANNULA VESSEL 3MM 2 BLNT TIP (CANNULA) ×3 IMPLANT
CLIP TI MEDIUM 6 (CLIP) ×3 IMPLANT
CLIP TI WIDE RED SMALL 6 (CLIP) ×6 IMPLANT
COVER PROBE W GEL 5X96 (DRAPES) ×3 IMPLANT
DECANTER SPIKE VIAL GLASS SM (MISCELLANEOUS) ×3 IMPLANT
DERMABOND ADVANCED (GAUZE/BANDAGES/DRESSINGS) ×2
DERMABOND ADVANCED .7 DNX12 (GAUZE/BANDAGES/DRESSINGS) ×1 IMPLANT
ELECT REM PT RETURN 9FT ADLT (ELECTROSURGICAL) ×3
ELECTRODE REM PT RTRN 9FT ADLT (ELECTROSURGICAL) ×1 IMPLANT
GLOVE BIO SURGEON STRL SZ 6.5 (GLOVE) ×2 IMPLANT
GLOVE BIO SURGEON STRL SZ7.5 (GLOVE) ×3 IMPLANT
GLOVE BIO SURGEONS STRL SZ 6.5 (GLOVE) ×1
GLOVE BIOGEL PI IND STRL 6.5 (GLOVE) ×1 IMPLANT
GLOVE BIOGEL PI IND STRL 7.0 (GLOVE) ×1 IMPLANT
GLOVE BIOGEL PI IND STRL 8 (GLOVE) ×2 IMPLANT
GLOVE BIOGEL PI INDICATOR 6.5 (GLOVE) ×2
GLOVE BIOGEL PI INDICATOR 7.0 (GLOVE) ×2
GLOVE BIOGEL PI INDICATOR 8 (GLOVE) ×4
GLOVE ECLIPSE 7.0 STRL STRAW (GLOVE) ×3 IMPLANT
GLOVE SURG SS PI 7.0 STRL IVOR (GLOVE) ×3 IMPLANT
GLOVE SURG SS PI 7.5 STRL IVOR (GLOVE) ×3 IMPLANT
GOWN STRL REUS W/ TWL LRG LVL3 (GOWN DISPOSABLE) ×3 IMPLANT
GOWN STRL REUS W/TWL LRG LVL3 (GOWN DISPOSABLE) ×9 IMPLANT
KIT BASIN OR (CUSTOM PROCEDURE TRAY) ×3 IMPLANT
KIT ROOM TURNOVER OR (KITS) ×3 IMPLANT
NS IRRIG 1000ML POUR BTL (IV SOLUTION) ×3 IMPLANT
PACK CV ACCESS (CUSTOM PROCEDURE TRAY) ×3 IMPLANT
PAD ARMBOARD 7.5X6 YLW CONV (MISCELLANEOUS) ×6 IMPLANT
SPONGE SURGIFOAM ABS GEL 100 (HEMOSTASIS) IMPLANT
SUT PROLENE 6 0 BV (SUTURE) ×3 IMPLANT
SUT VIC AB 3-0 SH 27 (SUTURE) ×2
SUT VIC AB 3-0 SH 27X BRD (SUTURE) ×1 IMPLANT
SUT VICRYL 4-0 PS2 18IN ABS (SUTURE) ×3 IMPLANT
UNDERPAD 30X30 (UNDERPADS AND DIAPERS) ×3 IMPLANT
WATER STERILE IRR 1000ML POUR (IV SOLUTION) ×3 IMPLANT

## 2016-07-13 NOTE — Op Note (Signed)
    NAME: Wendy Kerr    MRN: [id number] DOB: 1930/08/16    DATE OF OPERATION: 07/13/2016  PREOP DIAGNOSIS:    Stage IV chronic kidney disease  POSTOP DIAGNOSIS:    Same  PROCEDURE:    RIGHT RADIAL-CEPHALIC AVF   SURGEON: Judeth Cornfield. Scot Dock, MD, FACS  ASSIST: Gerri Lins PA  ANESTHESIA: Local with sedation   EBL: Minimal  INDICATIONS:    Wendy Kerr is a 83 y.o. female who is not yet on dialysis. We were asked to place an AV fistula. If an AV fistula was not possible we were asked not to place an AV graft.  FINDINGS:    3 mm forearm cephalic vein  TECHNIQUE:   The patient was taken to the operating room and sedated by anesthesia. The right upper extremity was prepped and draped in the usual sterile fashion. After the skin was anesthetized with 1% lidocaine, a longitudinal incision was made over the cephalic vein in the distal third of the forearm. Here the vein was dissected free and ligated distally. It irrigated up with heparinized saline and was a 3 mm vein. The brachial artery was dissected free beneath the fascia. The patient was heparinized. The brachial artery was clamped proximally and distally and a longitudinal arteriotomy was made. The vein was spatulated and sewn end-to-side to the artery using continuous 6-0 Prolene suture. At the completion was a good thrill in the fistula. There was a radial and ulnar signal with the Doppler. Hemostasis was obtained in the wound. The heparin was partially reversed with protamine. The wound was closed with the peritoneal Vicryl in the skin. 4-0 Vicryl. Dermabond was applied. The patient tolerated the procedure well and was transferred to the recovery room in stable condition. All needle and sponge counts were correct.  Deitra Mayo, MD, FACS [facility]  DATE OF DICTATION:   07/13/2016

## 2016-07-13 NOTE — H&P (View-Only) (Signed)
Patient name: Wendy Kerr MRN: [id number] DOB: 22-Jul-1930 Sex: female   REASON FOR CONSULT:    Evaluate for hemodialysis access. Referred by Dr. Jamal Maes.  HPI:   Wendy Kerr is a 83 y.o. female, He was referred for evaluation for hemodialysis access. I have reviewed the office note that was sent with the patient from the encounter on 06/21/2016. This patient has had hypertension since age 78. She also has diabetes. She has multiple other medical conditions including obstructive sleep apnea, paroxysmal atrial fibrillation on chronic anticoagulation, and combined systolic and diastolic heart failure. GFR most recently was 14.  According to the records that were sent with the patient, our instructions were to place an AV fistula. If this were not possible we were to wait on placement of an AV graft.  The patient denies any recent uremic symptoms. Specifically she denies nausea, vomiting, fatigue, or anorexia.  Past Medical History:  Diagnosis Date  . 1st degree AV block   . Anemia   . Atrial flutter ([place])   . Benign neoplasm of pancreas, except islets of Langerhans   . Bronchitis, mucopurulent recurrent ([place])   . Common migraine   . Diabetes mellitus ([place])   . Diverticulosis of colon   . DJD (degenerative joint disease)   . Generalized anxiety disorder   . Hypercholesterolemia   . Hypertension   . Monoclonal gammopathy   . Obesity   . OSA (obstructive sleep apnea)    severe with AHI 37 events per hour  . Vitamin D deficiency     Family History  Problem Relation Age of Onset  . Hypertension Mother   . Kidney failure Mother        dialysis  . Heart Problems Mother 46  . Hypertension Father   . Kidney failure Father        dialysis  . Heart Problems Father 75  . Kidney disease Sister   . Kidney disease Other     SOCIAL HISTORY: Social History   Social History  . Marital status: Single    Spouse name: N/A  . Number of children: 2  . Years of  education: N/A   Occupational History  . 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 Smoking status: Never Smoker  . Smokeless tobacco: Never Used  . Alcohol use No  . Drug use: No  . Sexual activity: Not on file   Other Topics Concern  . 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  Ambulates independently.    Allergies  Allergen Reactions  . Piroxicam Other (See Comments)    REACTION: HEADACHE    Current Outpatient Prescriptions  Medication Sig Dispense Refill  . atorvastatin (LIPITOR) 20 MG tablet Take 1 tablet (20 mg total) by mouth daily at 6 PM. 30 tablet 6  . ELIQUIS 2.5 MG TABS tablet TAKE 1 TABLET BY MOUTH 2 TIMES DAILY. (Patient taking differently: Take 2.5 mg by mouth twice daily) 60 tablet 8  . hydrALAZINE (APRESOLINE) 25 MG tablet Take 0.5 tablets (12.5 mg total) by mouth 2 (two) times daily.    . hydrOXYzine (ATARAX/VISTARIL) 25 MG tablet TAKE 1 TABLET (25 MG TOTAL) BY MOUTH 3 (THREE) TIMES DAILY AS NEEDED. 90 tablet 0  . isosorbide mononitrate (IMDUR) 60 MG 24 hr tablet Take 0.5 tablets (30 mg total) by mouth daily. 15 tablet  5  . levothyroxine (SYNTHROID) 50 MCG tablet Take 1 tablet (50 mcg total) by mouth daily before breakfast. 90 tablet 0  . Multiple Vitamin (MULTIVITAMIN WITH MINERALS) TABS tablet Take 1 tablet by mouth every morning.    . nitroGLYCERIN (NITROSTAT) 0.4 MG SL tablet Place 1 tablet (0.4 mg total) under the tongue every 5 (five) minutes as needed for chest pain. 5 tablet 3  . OVER THE COUNTER MEDICATION Take 1 tablet by mouth daily. MED NAME: IRON    . torsemide (DEMADEX) 20 MG tablet Take 1 tablet (20 mg total) by mouth 2 (two) times daily. 60 tablet 3  . Vitamin D, Ergocalciferol, (DRISDOL) 50000 units CAPS capsule Take 1 capsule (50,000 Units total) by mouth every 7 (seven) days. 12 capsule 3   No current facility-administered medications for this visit.      REVIEW OF SYSTEMS:  [X]  denotes positive finding, [ ]  denotes negative finding Cardiac  Comments:  Chest pain or chest pressure:    Shortness of breath upon exertion:    Short of breath when lying flat:    Irregular heart rhythm: X       Vascular    Pain in calf, thigh, or hip brought on by ambulation:    Pain in feet at night that wakes you up from your sleep:     Blood clot in your veins:    Leg swelling:  X       Pulmonary    Oxygen at home:    Productive cough:  X   Wheezing:         Neurologic    Sudden weakness in arms or legs:     Sudden numbness in arms or legs:     Sudden onset of difficulty speaking or slurred speech:    Temporary loss of vision in one eye:     Problems with dizziness:         Gastrointestinal    Blood in stool:     Vomited blood:         Genitourinary    Burning when urinating:     Blood in urine:        Psychiatric    Major depression:         Hematologic    Bleeding problems:    Problems with blood clotting too easily:        Skin    Rashes or ulcers:        Constitutional    Fever or chills:     PHYSICAL EXAM:   Vitals:   07/11/16 0959  BP: 101/70  Pulse: 82  Resp: 16  Temp: 97.1 F (36.2 C)  TempSrc: Oral  SpO2: 97%  Weight: 163 lb (73.9 kg)  Height: 5\' 5"  (1.651 m)    GENERAL: The patient is a well-nourished female, in no acute distress. The vital signs are documented above. CARDIAC: There is a regular rate and rhythm.  VASCULAR: I do not detect carotid bruits. I cannot palpate pedal pulses. PULMONARY: There is good air exchange bilaterally without wheezing or rales. ABDOMEN: Slightly distended. MUSCULOSKELETAL: There are no major deformities or cyanosis. NEUROLOGIC: No focal weakness or paresthesias are detected. SKIN: There is a superficial ulceration on the left second toe. PSYCHIATRIC: The patient has a normal affect.  DATA:    UPPER EXTREMITY ARTERIAL DUPLEX: I have independently interpreted her  upper extremity arterial duplex scan today.  On the right side there is a triphasic radial and ulnar signal.  The brachial artery is 0.58 cm in diameter.  On the left side there is a triphasic radial and ulnar signal. The brachial artery on the left is 0.54 cm in diameter.  UPPER EXTREMITY VEIN MAP: I have been apparently interpreted her upper extremity vein map.  On the right side the forearm and upper arm cephalic vein looked reasonable in size. The basilic vein is marginal.    On the left side the upper arm cephalic vein is marginal in size. The basilic vein is marginal.  MEDICAL ISSUES:   STAGE IV CHRONIC KIDNEY DISEASE: Based on the vein map I think her best option for a fistula would be a right radiocephalic or right brachiocephalic fistula. We have been asked not to place a graft if a fistula is not possible. We will hold her Eliquis for 48 hours prior to the procedure. She does have a superficial ulceration on the left second toe and if this progresses she would need a workup for peripheral vascular disease. Obviously she's not a good candidate for an arteriogram given her renal insufficiency. In addition she is 55 with multiple medical comorbidities. We have reviewed the indications for placement of an AV fistula and the potential complications of the procedure. She is agreeable to proceed. This has been scheduled for 07/13/2016.  Deitra Mayo [facility] [place] [phone]

## 2016-07-13 NOTE — Anesthesia Procedure Notes (Signed)
Procedure Name: MAC Date/Time: 07/13/2016 8:15 AM Performed by: Jacquiline Doe A Pre-anesthesia Checklist: Patient identified, Emergency Drugs available, Suction available and Patient being monitored Patient Re-evaluated:Patient Re-evaluated prior to inductionOxygen Delivery Method: Simple face mask and Nasal cannula Intubation Type: IV induction Placement Confirmation: positive ETCO2 Dental Injury: Teeth and Oropharynx as per pre-operative assessment

## 2016-07-13 NOTE — Transfer of Care (Signed)
Immediate Anesthesia Transfer of Care Note  Patient: ALSIE YOUNES  Procedure(s) Performed: Procedure(s): Creation Arteriovenous Fistula Right Arm (Right)  Patient Location: PACU  Anesthesia Type:MAC  Level of Consciousness: awake, oriented, sedated, patient cooperative and responds to stimulation  Airway & Oxygen Therapy: Patient Spontanous Breathing and Patient connected to face mask oxygen  Post-op Assessment: Report given to RN, Post -op Vital signs reviewed and stable, Patient moving all extremities and Patient moving all extremities X 4  Post vital signs: Reviewed and stable  Last Vitals:  Vitals:   07/13/16 0715  BP: 121/79  Pulse: 85  Resp: 16  Temp: 36.6 C    Last Pain: There were no vitals filed for this visit.    Patients Stated Pain Goal: 2 (26/37/85 8850)  Complications: No apparent anesthesia complications

## 2016-07-13 NOTE — Interval H&P Note (Signed)
History and Physical Interval Note:  07/13/2016 7:01 AM  [place]  has presented today for surgery, with the diagnosis of Chronic kidney disease stage 4  The various methods of treatment have been discussed with the patient and family. After consideration of risks, benefits and other options for treatment, the patient has consented to  Procedure(s): ARTERIOVENOUS (AV) FISTULA CREATION RIGHT ARM (Right) as a surgical intervention .  The patient's history has been reviewed, patient examined, no change in status, stable for surgery.  I have reviewed the patient's chart and labs.  Questions were answered to the patient's satisfaction.     Deitra Mayo

## 2016-07-13 NOTE — Anesthesia Postprocedure Evaluation (Signed)
Anesthesia Post Note  Patient: Wendy Kerr  Procedure(s) Performed: Procedure(s) (LRB): Creation Arteriovenous Fistula Right Arm (Right)  Patient location during evaluation: PACU Anesthesia Type: MAC Level of consciousness: awake and alert Pain management: pain level controlled Vital Signs Assessment: post-procedure vital signs reviewed and stable Respiratory status: spontaneous breathing, nonlabored ventilation, respiratory function stable and patient connected to nasal cannula oxygen Cardiovascular status: stable and blood pressure returned to baseline Anesthetic complications: no       Last Vitals:  Vitals:   07/13/16 1100 07/13/16 1111  BP:  106/61  Pulse: 74 79  Resp: 16 16  Temp: 36.4 C     Last Pain:  Vitals:   07/13/16 1111  PainSc: 0-No pain                 Jacobey Gura P Esmee Fallaw

## 2016-07-16 ENCOUNTER — Encounter (HOSPITAL_COMMUNITY): Payer: Self-pay | Admitting: Vascular Surgery

## 2016-07-16 ENCOUNTER — Telehealth: Payer: Self-pay | Admitting: Vascular Surgery

## 2016-07-16 NOTE — Telephone Encounter (Signed)
-----   Message from Mena Goes, RN sent at 07/13/2016  9:23 AM EDT ----- Regarding: postop 6 weeks w/ duplex   ----- Message ----- From: Angelia Mould, MD Sent: 07/13/2016   9:20 AM To: [facility] Charge Pool Subject: CHARGE                                          PROCEDURE:   RIGHT RADIAL-CEPHALIC AVF   SURGEON: Judeth Cornfield. Scot Dock, MD, FACS  ASSIST: Gerri Lins PA  She will need a follow up visit in 6 weeks with a duplex at that time to check on the maturation of her fistula. Thank you. CD

## 2016-07-16 NOTE — Telephone Encounter (Signed)
Sched lab 08/20/16 at 4:00 and MD 08/22/16 at 10:15. Spoke to pt, pt will have son call us since he is her transportation.

## 2016-07-17 ENCOUNTER — Other Ambulatory Visit: Payer: Self-pay

## 2016-07-18 ENCOUNTER — Other Ambulatory Visit: Payer: Self-pay | Admitting: Interventional Cardiology

## 2016-07-18 ENCOUNTER — Encounter (HOSPITAL_COMMUNITY): Payer: Self-pay | Admitting: *Deleted

## 2016-07-18 NOTE — Telephone Encounter (Signed)
Request for Eliquis 2.5mg  received; pt is 83 yrs old, wt-73.9kg, Crea-3.80 on 06/29/16, & last seen by Dr. Tamala Julian on 06/29/16. Will send in refill request to requested pharmacy.

## 2016-07-18 NOTE — Progress Notes (Signed)
Pt denies SOB and chest pain. Pt under the care of Dr. Daneen Schick, Cardiology. Pt denies having a cardiac cath. Pt made aware to stop taking vitamins, fish oil and herbal medications. Do not take any NSAIDs ie: Ibuprofen, Advil, Naproxen, BC and Goody Powder. Pt stated that she is not taking Eliquis. Pt stated that she rarely checks her BG when providing pre-op diabetes protocol instructions. Pt asked to check BG every 2 hours prior to arrival to hospital on DOS since her arrival time is 11:30 AM. Pt made aware to drink 4 ounces of apple juice to treat a BG< 70, wait 15 minutes after drinking juice to recheck BG, if BG remains < 70 call SS unit and speak with a nurse. Pt verbalized understanding of all pre-op instructions.

## 2016-07-20 ENCOUNTER — Ambulatory Visit (HOSPITAL_COMMUNITY): Payer: Medicare Other | Admitting: Certified Registered Nurse Anesthetist

## 2016-07-20 ENCOUNTER — Encounter (HOSPITAL_COMMUNITY)
Admission: RE | Disposition: A | Discharge status: Home / Self Care | Payer: Self-pay | Source: Ambulatory Visit | Attending: Vascular Surgery

## 2016-07-20 ENCOUNTER — Ambulatory Visit (HOSPITAL_COMMUNITY)
Admission: RE | Admit: 2016-07-20 | Discharge: 2016-07-20 | Disposition: A | Discharge status: Home / Self Care | Payer: Medicare Other | Source: Ambulatory Visit | Attending: Vascular Surgery | Admitting: Vascular Surgery

## 2016-07-20 ENCOUNTER — Encounter (HOSPITAL_COMMUNITY): Payer: Self-pay | Admitting: Certified Registered"

## 2016-07-20 ENCOUNTER — Ambulatory Visit (HOSPITAL_COMMUNITY): Payer: Medicare Other

## 2016-07-20 DIAGNOSIS — Z7901 Long term (current) use of anticoagulants: Secondary | ICD-10-CM | POA: Diagnosis not present

## 2016-07-20 DIAGNOSIS — I4892 Unspecified atrial flutter: Secondary | ICD-10-CM | POA: Diagnosis not present

## 2016-07-20 DIAGNOSIS — G4733 Obstructive sleep apnea (adult) (pediatric): Secondary | ICD-10-CM | POA: Insufficient documentation

## 2016-07-20 DIAGNOSIS — E11621 Type 2 diabetes mellitus with foot ulcer: Secondary | ICD-10-CM | POA: Diagnosis not present

## 2016-07-20 DIAGNOSIS — L97529 Non-pressure chronic ulcer of other part of left foot with unspecified severity: Secondary | ICD-10-CM | POA: Insufficient documentation

## 2016-07-20 DIAGNOSIS — I129 Hypertensive chronic kidney disease with stage 1 through stage 4 chronic kidney disease, or unspecified chronic kidney disease: Secondary | ICD-10-CM | POA: Diagnosis not present

## 2016-07-20 DIAGNOSIS — E7800 Pure hypercholesterolemia, unspecified: Secondary | ICD-10-CM | POA: Diagnosis not present

## 2016-07-20 DIAGNOSIS — E559 Vitamin D deficiency, unspecified: Secondary | ICD-10-CM | POA: Insufficient documentation

## 2016-07-20 DIAGNOSIS — N184 Chronic kidney disease, stage 4 (severe): Secondary | ICD-10-CM | POA: Diagnosis not present

## 2016-07-20 DIAGNOSIS — I480 Paroxysmal atrial fibrillation: Secondary | ICD-10-CM | POA: Diagnosis not present

## 2016-07-20 DIAGNOSIS — Z452 Encounter for adjustment and management of vascular access device: Secondary | ICD-10-CM | POA: Diagnosis not present

## 2016-07-20 DIAGNOSIS — Z992 Dependence on renal dialysis: Secondary | ICD-10-CM | POA: Diagnosis not present

## 2016-07-20 DIAGNOSIS — N186 End stage renal disease: Secondary | ICD-10-CM | POA: Diagnosis not present

## 2016-07-20 DIAGNOSIS — I5042 Chronic combined systolic (congestive) and diastolic (congestive) heart failure: Secondary | ICD-10-CM | POA: Diagnosis not present

## 2016-07-20 DIAGNOSIS — Z79899 Other long term (current) drug therapy: Secondary | ICD-10-CM | POA: Diagnosis not present

## 2016-07-20 DIAGNOSIS — Z419 Encounter for procedure for purposes other than remedying health state, unspecified: Secondary | ICD-10-CM

## 2016-07-20 DIAGNOSIS — Z95828 Presence of other vascular implants and grafts: Secondary | ICD-10-CM

## 2016-07-20 DIAGNOSIS — I120 Hypertensive chronic kidney disease with stage 5 chronic kidney disease or end stage renal disease: Secondary | ICD-10-CM | POA: Diagnosis not present

## 2016-07-20 DIAGNOSIS — E1122 Type 2 diabetes mellitus with diabetic chronic kidney disease: Secondary | ICD-10-CM | POA: Insufficient documentation

## 2016-07-20 HISTORY — PX: INSERTION OF DIALYSIS CATHETER: SHX1324

## 2016-07-20 LAB — POCT I-STAT 4, (NA,K, GLUC, HGB,HCT)
Glucose, Bld: 94 mg/dL (ref 65–99)
HCT: 38 % (ref 36.0–46.0)
HEMOGLOBIN: 12.9 g/dL (ref 12.0–15.0)
POTASSIUM: 6.8 mmol/L — AB (ref 3.5–5.1)
Sodium: 130 mmol/L — ABNORMAL LOW (ref 135–145)

## 2016-07-20 LAB — PROTIME-INR
INR: 1.27
PROTHROMBIN TIME: 16 s — AB (ref 11.4–15.2)

## 2016-07-20 LAB — POTASSIUM: POTASSIUM: 3.9 mmol/L (ref 3.5–5.1)

## 2016-07-20 LAB — GLUCOSE, CAPILLARY: GLUCOSE-CAPILLARY: 118 mg/dL — AB (ref 65–99)

## 2016-07-20 SURGERY — INSERTION OF DIALYSIS CATHETER
Anesthesia: Monitor Anesthesia Care | Site: Neck | Laterality: Right

## 2016-07-20 MED ORDER — DEXAMETHASONE SODIUM PHOSPHATE 10 MG/ML IJ SOLN
INTRAMUSCULAR | Status: DC | PRN
  Administered 2016-07-20: 10 mg via INTRAVENOUS

## 2016-07-20 MED ORDER — CEFUROXIME SODIUM 1.5 G IV SOLR
INTRAVENOUS | Status: AC
  Filled 2016-07-20: qty 1.5

## 2016-07-20 MED ORDER — SODIUM CHLORIDE 0.9 % IV SOLN
INTRAVENOUS | Status: DC | PRN
  Administered 2016-07-20: 13:00:00

## 2016-07-20 MED ORDER — LIDOCAINE-EPINEPHRINE (PF) 1 %-1:200000 IJ SOLN
INTRAMUSCULAR | Status: DC | PRN
  Administered 2016-07-20: 30 mL

## 2016-07-20 MED ORDER — 0.9 % SODIUM CHLORIDE (POUR BTL) OPTIME
TOPICAL | Status: DC | PRN
  Administered 2016-07-20: 1000 mL

## 2016-07-20 MED ORDER — SODIUM CHLORIDE 0.9 % IV SOLN
INTRAVENOUS | Status: DC
  Administered 2016-07-20: 13:00:00 via INTRAVENOUS

## 2016-07-20 MED ORDER — DEXTROSE 5 % IV SOLN
1.5000 g | INTRAVENOUS | Status: AC
  Administered 2016-07-20: 1.5 g via INTRAVENOUS

## 2016-07-20 MED ORDER — HEPARIN SODIUM (PORCINE) 1000 UNIT/ML IJ SOLN
INTRAMUSCULAR | Status: DC | PRN
  Administered 2016-07-20: 1000 [IU]

## 2016-07-20 MED ORDER — PROPOFOL 500 MG/50ML IV EMUL
INTRAVENOUS | Status: DC | PRN
  Administered 2016-07-20: 75 ug/kg/min via INTRAVENOUS

## 2016-07-20 MED ORDER — LIDOCAINE-EPINEPHRINE (PF) 1 %-1:200000 IJ SOLN
INTRAMUSCULAR | Status: AC
  Filled 2016-07-20: qty 30

## 2016-07-20 MED ORDER — PHENYLEPHRINE HCL 10 MG/ML IJ SOLN
INTRAMUSCULAR | Status: DC | PRN
  Administered 2016-07-20: 25 ug/min via INTRAVENOUS

## 2016-07-20 MED ORDER — LIDOCAINE 2% (20 MG/ML) 5 ML SYRINGE
INTRAMUSCULAR | Status: DC | PRN
  Administered 2016-07-20: 60 mg via INTRAVENOUS

## 2016-07-20 MED ORDER — HEPARIN SODIUM (PORCINE) 1000 UNIT/ML IJ SOLN
INTRAMUSCULAR | Status: AC
  Filled 2016-07-20: qty 1

## 2016-07-20 MED ORDER — ONDANSETRON HCL 4 MG/2ML IJ SOLN
INTRAMUSCULAR | Status: DC | PRN
Start: 2016-07-20 — End: 2016-07-20
  Administered 2016-07-20: 4 mg via INTRAVENOUS

## 2016-07-20 MED ORDER — PHENYLEPHRINE 40 MCG/ML (10ML) SYRINGE FOR IV PUSH (FOR BLOOD PRESSURE SUPPORT)
PREFILLED_SYRINGE | INTRAVENOUS | Status: AC
  Filled 2016-07-20: qty 10

## 2016-07-20 MED ORDER — OXYCODONE-ACETAMINOPHEN 5-325 MG PO TABS: 1.0000 | ORAL_TABLET | ORAL | 0 refills | Status: DC | PRN

## 2016-07-20 MED ORDER — FENTANYL CITRATE (PF) 250 MCG/5ML IJ SOLN
INTRAMUSCULAR | Status: AC
  Filled 2016-07-20: qty 5

## 2016-07-20 MED ORDER — CHLORHEXIDINE GLUCONATE CLOTH 2 % EX PADS: 6.0000 | MEDICATED_PAD | Freq: Once | CUTANEOUS | Status: DC

## 2016-07-20 MED ORDER — PHENYLEPHRINE 40 MCG/ML (10ML) SYRINGE FOR IV PUSH (FOR BLOOD PRESSURE SUPPORT)
PREFILLED_SYRINGE | INTRAVENOUS | Status: DC | PRN
Start: 2016-07-20 — End: 2016-07-20
  Administered 2016-07-20 (×2): 120 ug via INTRAVENOUS

## 2016-07-20 MED ORDER — MIDAZOLAM HCL 2 MG/2ML IJ SOLN
INTRAMUSCULAR | Status: AC
  Filled 2016-07-20: qty 2

## 2016-07-20 MED ORDER — FENTANYL CITRATE (PF) 250 MCG/5ML IJ SOLN
INTRAMUSCULAR | Status: DC | PRN
  Administered 2016-07-20: 50 ug via INTRAVENOUS

## 2016-07-20 MED ORDER — PROPOFOL 10 MG/ML IV BOLUS
INTRAVENOUS | Status: DC | PRN
Start: 2016-07-20 — End: 2016-07-20
  Administered 2016-07-20: 20 mg via INTRAVENOUS

## 2016-07-20 SURGICAL SUPPLY — 37 items
BAG DECANTER FOR FLEXI CONT (MISCELLANEOUS) ×2 IMPLANT
BIOPATCH RED 1 DISK 7.0 (GAUZE/BANDAGES/DRESSINGS) ×2 IMPLANT
CATH PALINDROME RT-P 15FX23CM (CATHETERS) ×2 IMPLANT
CHLORAPREP W/TINT 26ML (MISCELLANEOUS) ×2 IMPLANT
COVER PROBE W GEL 5X96 (DRAPES) ×2 IMPLANT
COVER SURGICAL LIGHT HANDLE (MISCELLANEOUS) ×2 IMPLANT
DERMABOND ADVANCED (GAUZE/BANDAGES/DRESSINGS) ×1
DERMABOND ADVANCED .7 DNX12 (GAUZE/BANDAGES/DRESSINGS) ×1 IMPLANT
DRAPE C-ARM 42X72 X-RAY (DRAPES) ×2 IMPLANT
DRAPE CHEST BREAST 15X10 FENES (DRAPES) ×2 IMPLANT
GAUZE SPONGE 4X4 16PLY XRAY LF (GAUZE/BANDAGES/DRESSINGS) ×2 IMPLANT
GLOVE BIO SURGEON STRL SZ7.5 (GLOVE) ×2 IMPLANT
GLOVE BIOGEL PI IND STRL 7.0 (GLOVE) ×1 IMPLANT
GLOVE BIOGEL PI IND STRL 8 (GLOVE) ×1 IMPLANT
GLOVE BIOGEL PI INDICATOR 7.0 (GLOVE) ×1
GLOVE BIOGEL PI INDICATOR 8 (GLOVE) ×1
GLOVE SURG SS PI 6.5 STRL IVOR (GLOVE) ×2 IMPLANT
GLOVE SURG SS PI 7.0 STRL IVOR (GLOVE) ×2 IMPLANT
GOWN STRL REUS W/ TWL LRG LVL3 (GOWN DISPOSABLE) ×2 IMPLANT
GOWN STRL REUS W/TWL LRG LVL3 (GOWN DISPOSABLE) ×2
KIT BASIN OR (CUSTOM PROCEDURE TRAY) ×2 IMPLANT
KIT ROOM TURNOVER OR (KITS) ×2 IMPLANT
NEEDLE 18GX1X1/2 (RX/OR ONLY) (NEEDLE) ×2 IMPLANT
NEEDLE HYPO 25GX1X1/2 BEV (NEEDLE) ×2 IMPLANT
NS IRRIG 1000ML POUR BTL (IV SOLUTION) ×2 IMPLANT
PACK SURGICAL SETUP 50X90 (CUSTOM PROCEDURE TRAY) ×2 IMPLANT
PAD ARMBOARD 7.5X6 YLW CONV (MISCELLANEOUS) ×4 IMPLANT
SPONGE LAP 18X18 X RAY DECT (DISPOSABLE) ×2 IMPLANT
SUT ETHILON 3 0 PS 1 (SUTURE) ×2 IMPLANT
SUT VICRYL 4-0 PS2 18IN ABS (SUTURE) ×2 IMPLANT
SYR 10ML LL (SYRINGE) ×2 IMPLANT
SYR 20CC LL (SYRINGE) ×4 IMPLANT
SYR 5ML LL (SYRINGE) ×4 IMPLANT
SYR CONTROL 10ML LL (SYRINGE) ×2 IMPLANT
TOWEL OR 17X24 6PK STRL BLUE (TOWEL DISPOSABLE) ×2 IMPLANT
TOWEL OR 17X26 10 PK STRL BLUE (TOWEL DISPOSABLE) ×2 IMPLANT
WATER STERILE IRR 1000ML POUR (IV SOLUTION) ×2 IMPLANT

## 2016-07-20 NOTE — Anesthesia Procedure Notes (Signed)
Procedure Name: MAC Date/Time: 07/20/2016 12:52 PM Performed by: Mervyn Gay Pre-anesthesia Checklist: Patient identified, Patient being monitored, Timeout performed, Emergency Drugs available and Suction available Patient Re-evaluated:Patient Re-evaluated prior to inductionOxygen Delivery Method: Simple face mask Number of attempts: 1 Placement Confirmation: positive ETCO2 Dental Injury: Teeth and Oropharynx as per pre-operative assessment

## 2016-07-20 NOTE — Anesthesia Preprocedure Evaluation (Signed)
Anesthesia Evaluation  Patient identified by MRN, date of birth, ID band Patient awake    Reviewed: Allergy & Precautions, NPO status , Patient's Chart, lab work & pertinent test results  Airway Mallampati: II  TM Distance: >3 FB Neck ROM: Full    Dental no notable dental hx.    Pulmonary neg pulmonary ROS,    Pulmonary exam normal breath sounds clear to auscultation       Cardiovascular hypertension, +CHF  Normal cardiovascular exam Rhythm:Regular Rate:Normal     Neuro/Psych negative neurological ROS  negative psych ROS   GI/Hepatic negative GI ROS, Neg liver ROS,   Endo/Other  diabetes  Renal/GU Renal disease  negative genitourinary   Musculoskeletal negative musculoskeletal ROS (+)   Abdominal   Peds negative pediatric ROS (+)  Hematology negative hematology ROS (+)   Anesthesia Other Findings   Reproductive/Obstetrics negative OB ROS                             Anesthesia Physical Anesthesia Plan  ASA: III  Anesthesia Plan: MAC   Post-op Pain Management:    Induction: Intravenous  Airway Management Planned: Simple Face Mask  Additional Equipment:   Intra-op Plan:   Post-operative Plan:   Informed Consent: I have reviewed the patients History and Physical, chart, labs and discussed the procedure including the risks, benefits and alternatives for the proposed anesthesia with the patient or authorized representative who has indicated his/her understanding and acceptance.   Dental advisory given  Plan Discussed with: CRNA and Surgeon  Anesthesia Plan Comments:         Anesthesia Quick Evaluation

## 2016-07-20 NOTE — Op Note (Signed)
07/20/2016  PREOP DIAGNOSIS: Chronic kidney disease  POSTOP DIAGNOSIS: Chronic kidney disease  PROCEDURE: Ultrasound guided placement of right IJ tunneled dialysis catheter  (23 cm)  SURGEON: Judeth Cornfield. Scot Dock, MD, FACS  ASSIST: none  ANESTHESIA: local with sedation   EBL: minimal  FINDINGS: patent right IJ  INDICATIONS: for HD  TECHNIQUE: The patient was taken to the operating room and sedated by anesthesia. The neck and upper chest were prepped and draped in the usual sterile fashion. After the skin was anesthetized with 1% lidocaine, and under ultrasound guidance, the right  IJ was cannulated and a guidewire introduced into the superior vena cava under fluoroscopic control. The tract over the wire was dilated and then the dilator and peel-away sheath were passed over the wire and the wire and dilator removed. The catheter was passed through the peel-away sheath and positioned in the right atrium. The exit site for the catheter was selected and the skin anesthetized between the 2 areas. The catheter was then brought through the tunnel, cut to the appropriate length, and the distal ports were attached. Both ports withdrew easily, were then flushed with heparinized saline and filled with concentrated heparin. The catheter was secured at its exit site with a 3-0 nylon suture. The IJ cannulation site was closed with a 4-0 subcuticular stitch. A sterile dressing was applied. The patient tolerated the procedure well and was transferred to the recovery room in stable condition. All needle and sponge counts were correct.  Deitra Mayo, MD, FACS [facility]: 07/20/2016 DATE OF DICTATION: 07/20/2016

## 2016-07-20 NOTE — Transfer of Care (Signed)
Immediate Anesthesia Transfer of Care Note  Patient: Wendy Kerr  Procedure(s) Performed: Procedure(s): INSERTION OF TUNNELED DIALYSIS CATHETER - RIGHT INTERNAL JUGULAR (Right)  Patient Location: PACU  Anesthesia Type:MAC  Level of Consciousness: drowsy and patient cooperative  Airway & Oxygen Therapy: Patient Spontanous Breathing and Patient connected to face mask oxygen  Post-op Assessment: Report given to RN and Post -op Vital signs reviewed and stable  Post vital signs: Reviewed and stable  Last Vitals:  Vitals:   07/20/16 1108  BP: 123/75  Pulse: 85  Resp: 16  Temp: 36.4 C    Last Pain:  Vitals:   07/20/16 1108  TempSrc: Oral  PainSc: 0-No pain      Patients Stated Pain Goal: 2 (38/88/28 0034)  Complications: No apparent anesthesia complications

## 2016-07-20 NOTE — Interval H&P Note (Signed)
History and Physical Interval Note:  07/20/2016 12:49 PM  [name]  has presented today for surgery, with the diagnosis of End Stage Renal Disease N18.6  The various methods of treatment have been discussed with the patient and family. After consideration of risks, benefits and other options for treatment, the patient has consented to  Procedure(s): INSERTION OF TUNNELED DIALYSIS CATHETER (N/A) as a surgical intervention .  The patient's history has been reviewed, patient examined, no change in status, stable for surgery.  I have reviewed the patient's chart and labs.  Questions were answered to the patient's satisfaction.     Wendy Kerr

## 2016-07-20 NOTE — H&P (View-Only) (Signed)
Patient name: Wendy Kerr MRN: [id number] DOB: [birth year] Sex: female   REASON FOR CONSULT:    Evaluate for hemodialysis access. Referred by Dr. Jamal Maes.  HPI:   Wendy Kerr is a 83 y.o. female, He was referred for evaluation for hemodialysis access. I have reviewed the office note that was sent with the patient from the encounter on 06/21/2016. This patient has had hypertension since age 75. She also has diabetes. She has multiple other medical conditions including obstructive sleep apnea, paroxysmal atrial fibrillation on chronic anticoagulation, and combined systolic and diastolic heart failure. GFR most recently was 14.  According to the records that were sent with the patient, our instructions were to place an AV fistula. If this were not possible we were to wait on placement of an AV graft.  The patient denies any recent uremic symptoms. Specifically she denies nausea, vomiting, fatigue, or anorexia.  Past Medical History:  Diagnosis Date  . 1st degree AV block   . Anemia   . Atrial flutter ([place])   . Benign neoplasm of pancreas, except islets of Langerhans   . Bronchitis, mucopurulent recurrent ([place])   . Common migraine   . Diabetes mellitus ([place])   . Diverticulosis of colon   . DJD (degenerative joint disease)   . Generalized anxiety disorder   . Hypercholesterolemia   . Hypertension   . Monoclonal gammopathy   . Obesity   . OSA (obstructive sleep apnea)    severe with AHI 37 events per hour  . Vitamin D deficiency     Family History  Problem Relation Age of Onset  . Hypertension Mother   . Kidney failure Mother        dialysis  . Heart Problems Mother 80  . Hypertension Father   . Kidney failure Father        dialysis  . Heart Problems Father 52  . Kidney disease Sister   . Kidney disease Other     SOCIAL HISTORY: Social History   Social History  . Marital status: Single    Spouse name: N/A  . Number of children: 2  . Years of  education: N/A   Occupational History  . 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 Smoking status: Never Smoker  . Smokeless tobacco: Never Used  . Alcohol use No  . Drug use: No  . Sexual activity: Not on file   Other Topics Concern  . 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  Ambulates independently.    Allergies  Allergen Reactions  . Piroxicam Other (See Comments)    REACTION: HEADACHE    Current Outpatient Prescriptions  Medication Sig Dispense Refill  . atorvastatin (LIPITOR) 20 MG tablet Take 1 tablet (20 mg total) by mouth daily at 6 PM. 30 tablet 6  . ELIQUIS 2.5 MG TABS tablet TAKE 1 TABLET BY MOUTH 2 TIMES DAILY. (Patient taking differently: Take 2.5 mg by mouth twice daily) 60 tablet 8  . hydrALAZINE (APRESOLINE) 25 MG tablet Take 0.5 tablets (12.5 mg total) by mouth 2 (two) times daily.    . hydrOXYzine (ATARAX/VISTARIL) 25 MG tablet TAKE 1 TABLET (25 MG TOTAL) BY MOUTH 3 (THREE) TIMES DAILY AS NEEDED. 90 tablet 0  . isosorbide mononitrate (IMDUR) 60 MG 24 hr tablet Take 0.5 tablets (30 mg total) by mouth daily. 15 tablet  5  . levothyroxine (SYNTHROID) 50 MCG tablet Take 1 tablet (50 mcg total) by mouth daily before breakfast. 90 tablet 0  . Multiple Vitamin (MULTIVITAMIN WITH MINERALS) TABS tablet Take 1 tablet by mouth every morning.    . nitroGLYCERIN (NITROSTAT) 0.4 MG SL tablet Place 1 tablet (0.4 mg total) under the tongue every 5 (five) minutes as needed for chest pain. 5 tablet 3  . OVER THE COUNTER MEDICATION Take 1 tablet by mouth daily. MED NAME: IRON    . torsemide (DEMADEX) 20 MG tablet Take 1 tablet (20 mg total) by mouth 2 (two) times daily. 60 tablet 3  . Vitamin D, Ergocalciferol, (DRISDOL) 50000 units CAPS capsule Take 1 capsule (50,000 Units total) by mouth every 7 (seven) days. 12 capsule 3   No current facility-administered medications for this visit.      REVIEW OF SYSTEMS:  [X]  denotes positive finding, [ ]  denotes negative finding Cardiac  Comments:  Chest pain or chest pressure:    Shortness of breath upon exertion:    Short of breath when lying flat:    Irregular heart rhythm: X       Vascular    Pain in calf, thigh, or hip brought on by ambulation:    Pain in feet at night that wakes you up from your sleep:     Blood clot in your veins:    Leg swelling:  X       Pulmonary    Oxygen at home:    Productive cough:  X   Wheezing:         Neurologic    Sudden weakness in arms or legs:     Sudden numbness in arms or legs:     Sudden onset of difficulty speaking or slurred speech:    Temporary loss of vision in one eye:     Problems with dizziness:         Gastrointestinal    Blood in stool:     Vomited blood:         Genitourinary    Burning when urinating:     Blood in urine:        Psychiatric    Major depression:         Hematologic    Bleeding problems:    Problems with blood clotting too easily:        Skin    Rashes or ulcers:        Constitutional    Fever or chills:     PHYSICAL EXAM:   Vitals:   07/11/16 0959  BP: 101/70  Pulse: 82  Resp: 16  Temp: 97.1 F (36.2 C)  TempSrc: Oral  SpO2: 97%  Weight: 163 lb (73.9 kg)  Height: 5\' 5"  (1.651 m)    GENERAL: The patient is a well-nourished female, in no acute distress. The vital signs are documented above. CARDIAC: There is a regular rate and rhythm.  VASCULAR: I do not detect carotid bruits. I cannot palpate pedal pulses. PULMONARY: There is good air exchange bilaterally without wheezing or rales. ABDOMEN: Slightly distended. MUSCULOSKELETAL: There are no major deformities or cyanosis. NEUROLOGIC: No focal weakness or paresthesias are detected. SKIN: There is a superficial ulceration on the left second toe. PSYCHIATRIC: The patient has a normal affect.  DATA:    UPPER EXTREMITY ARTERIAL DUPLEX: I have independently interpreted her  upper extremity arterial duplex scan today.  On the right side there is a triphasic radial and ulnar signal.  The brachial artery is 0.58 cm in diameter.  On the left side there is a triphasic radial and ulnar signal. The brachial artery on the left is 0.54 cm in diameter.  UPPER EXTREMITY VEIN MAP: I have been apparently interpreted her upper extremity vein map.  On the right side the forearm and upper arm cephalic vein looked reasonable in size. The basilic vein is marginal.    On the left side the upper arm cephalic vein is marginal in size. The basilic vein is marginal.  MEDICAL ISSUES:   STAGE IV CHRONIC KIDNEY DISEASE: Based on the vein map I think her best option for a fistula would be a right radiocephalic or right brachiocephalic fistula. We have been asked not to place a graft if a fistula is not possible. We will hold her Eliquis for 48 hours prior to the procedure. She does have a superficial ulceration on the left second toe and if this progresses she would need a workup for peripheral vascular disease. Obviously she's not a good candidate for an arteriogram given her renal insufficiency. In addition she is 37 with multiple medical comorbidities. We have reviewed the indications for placement of an AV fistula and the potential complications of the procedure. She is agreeable to proceed. This has been scheduled for 07/13/2016.  Deitra Mayo [facility] [phone]

## 2016-07-20 NOTE — Anesthesia Postprocedure Evaluation (Signed)
Anesthesia Post Note  Patient: Wendy Kerr  Procedure(s) Performed: Procedure(s) (LRB): INSERTION OF TUNNELED DIALYSIS CATHETER - RIGHT INTERNAL JUGULAR (Right)  Patient location during evaluation: PACU Anesthesia Type: MAC Level of consciousness: awake and alert Pain management: pain level controlled Vital Signs Assessment: post-procedure vital signs reviewed and stable Respiratory status: spontaneous breathing, nonlabored ventilation, respiratory function stable and patient connected to nasal cannula oxygen Cardiovascular status: stable and blood pressure returned to baseline Anesthetic complications: no       Last Vitals:  Vitals:   07/20/16 1108 07/20/16 1345  BP: 123/75   Pulse: 85   Resp: 16   Temp: 36.4 C 36.4 C    Last Pain:  Vitals:   07/20/16 1345  TempSrc:   PainSc: Asleep                 Radford Pease S

## 2016-07-20 NOTE — Progress Notes (Signed)
Repeat potassium came back 3.9.  Dr. Scot Dock and Dr. Kalman Shan notified.

## 2016-07-21 ENCOUNTER — Encounter (HOSPITAL_COMMUNITY): Payer: Self-pay | Admitting: Vascular Surgery

## 2016-07-23 DIAGNOSIS — D472 Monoclonal gammopathy: Secondary | ICD-10-CM | POA: Diagnosis not present

## 2016-07-23 DIAGNOSIS — N185 Chronic kidney disease, stage 5: Secondary | ICD-10-CM | POA: Diagnosis not present

## 2016-07-23 DIAGNOSIS — N2581 Secondary hyperparathyroidism of renal origin: Secondary | ICD-10-CM | POA: Diagnosis not present

## 2016-07-23 DIAGNOSIS — N186 End stage renal disease: Secondary | ICD-10-CM | POA: Diagnosis not present

## 2016-07-24 DIAGNOSIS — E1122 Type 2 diabetes mellitus with diabetic chronic kidney disease: Secondary | ICD-10-CM | POA: Diagnosis not present

## 2016-07-24 DIAGNOSIS — N184 Chronic kidney disease, stage 4 (severe): Secondary | ICD-10-CM | POA: Diagnosis not present

## 2016-07-24 DIAGNOSIS — Z7901 Long term (current) use of anticoagulants: Secondary | ICD-10-CM | POA: Diagnosis not present

## 2016-07-24 DIAGNOSIS — D472 Monoclonal gammopathy: Secondary | ICD-10-CM | POA: Diagnosis not present

## 2016-07-24 DIAGNOSIS — G4733 Obstructive sleep apnea (adult) (pediatric): Secondary | ICD-10-CM | POA: Diagnosis not present

## 2016-07-24 DIAGNOSIS — I4892 Unspecified atrial flutter: Secondary | ICD-10-CM | POA: Diagnosis not present

## 2016-07-24 DIAGNOSIS — I4510 Unspecified right bundle-branch block: Secondary | ICD-10-CM | POA: Diagnosis not present

## 2016-07-24 DIAGNOSIS — D136 Benign neoplasm of pancreas: Secondary | ICD-10-CM | POA: Diagnosis not present

## 2016-07-24 DIAGNOSIS — D631 Anemia in chronic kidney disease: Secondary | ICD-10-CM | POA: Diagnosis not present

## 2016-07-24 DIAGNOSIS — I130 Hypertensive heart and chronic kidney disease with heart failure and stage 1 through stage 4 chronic kidney disease, or unspecified chronic kidney disease: Secondary | ICD-10-CM | POA: Diagnosis not present

## 2016-07-24 DIAGNOSIS — I509 Heart failure, unspecified: Secondary | ICD-10-CM | POA: Diagnosis not present

## 2016-07-25 DIAGNOSIS — N186 End stage renal disease: Secondary | ICD-10-CM | POA: Diagnosis not present

## 2016-07-25 DIAGNOSIS — D472 Monoclonal gammopathy: Secondary | ICD-10-CM | POA: Diagnosis not present

## 2016-07-25 DIAGNOSIS — N185 Chronic kidney disease, stage 5: Secondary | ICD-10-CM | POA: Diagnosis not present

## 2016-07-25 DIAGNOSIS — N2581 Secondary hyperparathyroidism of renal origin: Secondary | ICD-10-CM | POA: Diagnosis not present

## 2016-07-26 DIAGNOSIS — Z992 Dependence on renal dialysis: Secondary | ICD-10-CM | POA: Diagnosis not present

## 2016-07-26 DIAGNOSIS — N186 End stage renal disease: Secondary | ICD-10-CM | POA: Diagnosis not present

## 2016-07-27 DIAGNOSIS — D509 Iron deficiency anemia, unspecified: Secondary | ICD-10-CM | POA: Diagnosis not present

## 2016-07-27 DIAGNOSIS — N185 Chronic kidney disease, stage 5: Secondary | ICD-10-CM | POA: Diagnosis not present

## 2016-07-27 DIAGNOSIS — D472 Monoclonal gammopathy: Secondary | ICD-10-CM | POA: Diagnosis not present

## 2016-07-27 DIAGNOSIS — N186 End stage renal disease: Secondary | ICD-10-CM | POA: Diagnosis not present

## 2016-07-27 DIAGNOSIS — N2581 Secondary hyperparathyroidism of renal origin: Secondary | ICD-10-CM | POA: Diagnosis not present

## 2016-07-30 DIAGNOSIS — N186 End stage renal disease: Secondary | ICD-10-CM | POA: Diagnosis not present

## 2016-07-30 DIAGNOSIS — D509 Iron deficiency anemia, unspecified: Secondary | ICD-10-CM | POA: Diagnosis not present

## 2016-07-30 DIAGNOSIS — N2581 Secondary hyperparathyroidism of renal origin: Secondary | ICD-10-CM | POA: Diagnosis not present

## 2016-07-30 DIAGNOSIS — D472 Monoclonal gammopathy: Secondary | ICD-10-CM | POA: Diagnosis not present

## 2016-07-30 DIAGNOSIS — N185 Chronic kidney disease, stage 5: Secondary | ICD-10-CM | POA: Diagnosis not present

## 2016-07-30 NOTE — Addendum Note (Signed)
Addendum  created 07/30/16 1454 by Myrtie Soman, MD   Sign clinical note

## 2016-07-30 NOTE — Anesthesia Postprocedure Evaluation (Signed)
Anesthesia Post Note  Patient: Wendy Kerr  Procedure(s) Performed: Procedure(s) (LRB): INSERTION OF TUNNELED DIALYSIS CATHETER - RIGHT INTERNAL JUGULAR (Right)     Anesthesia Post Evaluation  Last Vitals:  Vitals:   07/20/16 1400 07/20/16 1445  BP: 122/82 115/79  Pulse: 79 80  Resp: 11 13  Temp:  36.3 C    Last Pain:  Vitals:   07/20/16 1345  TempSrc:   PainSc: Asleep                 Annitta Fifield S

## 2016-07-31 DIAGNOSIS — I4510 Unspecified right bundle-branch block: Secondary | ICD-10-CM | POA: Diagnosis not present

## 2016-07-31 DIAGNOSIS — I4892 Unspecified atrial flutter: Secondary | ICD-10-CM | POA: Diagnosis not present

## 2016-07-31 DIAGNOSIS — D631 Anemia in chronic kidney disease: Secondary | ICD-10-CM | POA: Diagnosis not present

## 2016-07-31 DIAGNOSIS — D136 Benign neoplasm of pancreas: Secondary | ICD-10-CM | POA: Diagnosis not present

## 2016-07-31 DIAGNOSIS — N184 Chronic kidney disease, stage 4 (severe): Secondary | ICD-10-CM | POA: Diagnosis not present

## 2016-07-31 DIAGNOSIS — I509 Heart failure, unspecified: Secondary | ICD-10-CM | POA: Diagnosis not present

## 2016-07-31 DIAGNOSIS — I130 Hypertensive heart and chronic kidney disease with heart failure and stage 1 through stage 4 chronic kidney disease, or unspecified chronic kidney disease: Secondary | ICD-10-CM | POA: Diagnosis not present

## 2016-07-31 DIAGNOSIS — E1122 Type 2 diabetes mellitus with diabetic chronic kidney disease: Secondary | ICD-10-CM | POA: Diagnosis not present

## 2016-07-31 DIAGNOSIS — G4733 Obstructive sleep apnea (adult) (pediatric): Secondary | ICD-10-CM | POA: Diagnosis not present

## 2016-07-31 DIAGNOSIS — Z7901 Long term (current) use of anticoagulants: Secondary | ICD-10-CM | POA: Diagnosis not present

## 2016-07-31 DIAGNOSIS — D472 Monoclonal gammopathy: Secondary | ICD-10-CM | POA: Diagnosis not present

## 2016-08-01 DIAGNOSIS — D472 Monoclonal gammopathy: Secondary | ICD-10-CM | POA: Diagnosis not present

## 2016-08-01 DIAGNOSIS — N186 End stage renal disease: Secondary | ICD-10-CM | POA: Diagnosis not present

## 2016-08-01 DIAGNOSIS — N185 Chronic kidney disease, stage 5: Secondary | ICD-10-CM | POA: Diagnosis not present

## 2016-08-01 DIAGNOSIS — N2581 Secondary hyperparathyroidism of renal origin: Secondary | ICD-10-CM | POA: Diagnosis not present

## 2016-08-01 DIAGNOSIS — D509 Iron deficiency anemia, unspecified: Secondary | ICD-10-CM | POA: Diagnosis not present

## 2016-08-02 DIAGNOSIS — D631 Anemia in chronic kidney disease: Secondary | ICD-10-CM | POA: Diagnosis not present

## 2016-08-02 DIAGNOSIS — E1122 Type 2 diabetes mellitus with diabetic chronic kidney disease: Secondary | ICD-10-CM | POA: Diagnosis not present

## 2016-08-02 DIAGNOSIS — I4510 Unspecified right bundle-branch block: Secondary | ICD-10-CM | POA: Diagnosis not present

## 2016-08-02 DIAGNOSIS — I4892 Unspecified atrial flutter: Secondary | ICD-10-CM | POA: Diagnosis not present

## 2016-08-02 DIAGNOSIS — N184 Chronic kidney disease, stage 4 (severe): Secondary | ICD-10-CM | POA: Diagnosis not present

## 2016-08-02 DIAGNOSIS — Z7901 Long term (current) use of anticoagulants: Secondary | ICD-10-CM | POA: Diagnosis not present

## 2016-08-02 DIAGNOSIS — D136 Benign neoplasm of pancreas: Secondary | ICD-10-CM | POA: Diagnosis not present

## 2016-08-02 DIAGNOSIS — G4733 Obstructive sleep apnea (adult) (pediatric): Secondary | ICD-10-CM | POA: Diagnosis not present

## 2016-08-02 DIAGNOSIS — I509 Heart failure, unspecified: Secondary | ICD-10-CM | POA: Diagnosis not present

## 2016-08-02 DIAGNOSIS — I130 Hypertensive heart and chronic kidney disease with heart failure and stage 1 through stage 4 chronic kidney disease, or unspecified chronic kidney disease: Secondary | ICD-10-CM | POA: Diagnosis not present

## 2016-08-02 DIAGNOSIS — D472 Monoclonal gammopathy: Secondary | ICD-10-CM | POA: Diagnosis not present

## 2016-08-03 DIAGNOSIS — N186 End stage renal disease: Secondary | ICD-10-CM | POA: Diagnosis not present

## 2016-08-03 DIAGNOSIS — N185 Chronic kidney disease, stage 5: Secondary | ICD-10-CM | POA: Diagnosis not present

## 2016-08-03 DIAGNOSIS — D509 Iron deficiency anemia, unspecified: Secondary | ICD-10-CM | POA: Diagnosis not present

## 2016-08-03 DIAGNOSIS — D472 Monoclonal gammopathy: Secondary | ICD-10-CM | POA: Diagnosis not present

## 2016-08-03 DIAGNOSIS — N2581 Secondary hyperparathyroidism of renal origin: Secondary | ICD-10-CM | POA: Diagnosis not present

## 2016-08-06 DIAGNOSIS — D472 Monoclonal gammopathy: Secondary | ICD-10-CM | POA: Diagnosis not present

## 2016-08-06 DIAGNOSIS — N185 Chronic kidney disease, stage 5: Secondary | ICD-10-CM | POA: Diagnosis not present

## 2016-08-06 DIAGNOSIS — N2581 Secondary hyperparathyroidism of renal origin: Secondary | ICD-10-CM | POA: Diagnosis not present

## 2016-08-06 DIAGNOSIS — N186 End stage renal disease: Secondary | ICD-10-CM | POA: Diagnosis not present

## 2016-08-06 DIAGNOSIS — D509 Iron deficiency anemia, unspecified: Secondary | ICD-10-CM | POA: Diagnosis not present

## 2016-08-08 ENCOUNTER — Encounter (HOSPITAL_COMMUNITY): Payer: Self-pay | Admitting: *Deleted

## 2016-08-08 ENCOUNTER — Emergency Department (HOSPITAL_COMMUNITY): Payer: Medicare Other

## 2016-08-08 ENCOUNTER — Inpatient Hospital Stay (HOSPITAL_COMMUNITY)
Admission: EM | Admit: 2016-08-08 | Discharge: 2016-08-14 | DRG: 291 | Disposition: A | Discharge status: Home Health | Payer: Medicare Other | Attending: Family Medicine | Admitting: Family Medicine

## 2016-08-08 DIAGNOSIS — R601 Generalized edema: Secondary | ICD-10-CM | POA: Diagnosis not present

## 2016-08-08 DIAGNOSIS — R140 Abdominal distension (gaseous): Secondary | ICD-10-CM

## 2016-08-08 DIAGNOSIS — K219 Gastro-esophageal reflux disease without esophagitis: Secondary | ICD-10-CM | POA: Diagnosis present

## 2016-08-08 DIAGNOSIS — I129 Hypertensive chronic kidney disease with stage 1 through stage 4 chronic kidney disease, or unspecified chronic kidney disease: Secondary | ICD-10-CM | POA: Diagnosis not present

## 2016-08-08 DIAGNOSIS — Z90710 Acquired absence of both cervix and uterus: Secondary | ICD-10-CM

## 2016-08-08 DIAGNOSIS — R0602 Shortness of breath: Secondary | ICD-10-CM | POA: Diagnosis not present

## 2016-08-08 DIAGNOSIS — Z992 Dependence on renal dialysis: Secondary | ICD-10-CM | POA: Diagnosis not present

## 2016-08-08 DIAGNOSIS — R188 Other ascites: Secondary | ICD-10-CM | POA: Diagnosis present

## 2016-08-08 DIAGNOSIS — I472 Ventricular tachycardia: Secondary | ICD-10-CM | POA: Diagnosis not present

## 2016-08-08 DIAGNOSIS — I5022 Chronic systolic (congestive) heart failure: Secondary | ICD-10-CM | POA: Diagnosis present

## 2016-08-08 DIAGNOSIS — Z79899 Other long term (current) drug therapy: Secondary | ICD-10-CM

## 2016-08-08 DIAGNOSIS — I4891 Unspecified atrial fibrillation: Secondary | ICD-10-CM | POA: Diagnosis not present

## 2016-08-08 DIAGNOSIS — K7460 Unspecified cirrhosis of liver: Secondary | ICD-10-CM | POA: Diagnosis present

## 2016-08-08 DIAGNOSIS — Z7901 Long term (current) use of anticoagulants: Secondary | ICD-10-CM | POA: Diagnosis not present

## 2016-08-08 DIAGNOSIS — N186 End stage renal disease: Secondary | ICD-10-CM | POA: Diagnosis not present

## 2016-08-08 DIAGNOSIS — E785 Hyperlipidemia, unspecified: Secondary | ICD-10-CM | POA: Diagnosis present

## 2016-08-08 DIAGNOSIS — E1122 Type 2 diabetes mellitus with diabetic chronic kidney disease: Secondary | ICD-10-CM | POA: Diagnosis present

## 2016-08-08 DIAGNOSIS — E8889 Other specified metabolic disorders: Secondary | ICD-10-CM | POA: Diagnosis present

## 2016-08-08 DIAGNOSIS — D136 Benign neoplasm of pancreas: Secondary | ICD-10-CM | POA: Diagnosis not present

## 2016-08-08 DIAGNOSIS — G4733 Obstructive sleep apnea (adult) (pediatric): Secondary | ICD-10-CM | POA: Diagnosis not present

## 2016-08-08 DIAGNOSIS — I5023 Acute on chronic systolic (congestive) heart failure: Secondary | ICD-10-CM | POA: Diagnosis not present

## 2016-08-08 DIAGNOSIS — E8770 Fluid overload, unspecified: Secondary | ICD-10-CM | POA: Diagnosis not present

## 2016-08-08 DIAGNOSIS — I4892 Unspecified atrial flutter: Secondary | ICD-10-CM | POA: Diagnosis present

## 2016-08-08 DIAGNOSIS — Z8249 Family history of ischemic heart disease and other diseases of the circulatory system: Secondary | ICD-10-CM | POA: Diagnosis not present

## 2016-08-08 DIAGNOSIS — F411 Generalized anxiety disorder: Secondary | ICD-10-CM | POA: Diagnosis present

## 2016-08-08 DIAGNOSIS — E669 Obesity, unspecified: Secondary | ICD-10-CM | POA: Diagnosis present

## 2016-08-08 DIAGNOSIS — I5042 Chronic combined systolic (congestive) and diastolic (congestive) heart failure: Secondary | ICD-10-CM | POA: Diagnosis not present

## 2016-08-08 DIAGNOSIS — D649 Anemia, unspecified: Secondary | ICD-10-CM | POA: Diagnosis not present

## 2016-08-08 DIAGNOSIS — I132 Hypertensive heart and chronic kidney disease with heart failure and with stage 5 chronic kidney disease, or end stage renal disease: Principal | ICD-10-CM | POA: Diagnosis present

## 2016-08-08 DIAGNOSIS — I9589 Other hypotension: Secondary | ICD-10-CM | POA: Diagnosis not present

## 2016-08-08 DIAGNOSIS — E7800 Pure hypercholesterolemia, unspecified: Secondary | ICD-10-CM | POA: Diagnosis not present

## 2016-08-08 DIAGNOSIS — R296 Repeated falls: Secondary | ICD-10-CM | POA: Diagnosis not present

## 2016-08-08 DIAGNOSIS — E559 Vitamin D deficiency, unspecified: Secondary | ICD-10-CM | POA: Diagnosis present

## 2016-08-08 DIAGNOSIS — L299 Pruritus, unspecified: Secondary | ICD-10-CM | POA: Diagnosis present

## 2016-08-08 DIAGNOSIS — I959 Hypotension, unspecified: Secondary | ICD-10-CM | POA: Diagnosis not present

## 2016-08-08 DIAGNOSIS — Z6828 Body mass index (BMI) 28.0-28.9, adult: Secondary | ICD-10-CM

## 2016-08-08 DIAGNOSIS — N2581 Secondary hyperparathyroidism of renal origin: Secondary | ICD-10-CM | POA: Diagnosis present

## 2016-08-08 DIAGNOSIS — I360 Nonrheumatic tricuspid (valve) stenosis: Secondary | ICD-10-CM | POA: Diagnosis not present

## 2016-08-08 DIAGNOSIS — I2729 Other secondary pulmonary hypertension: Secondary | ICD-10-CM | POA: Diagnosis present

## 2016-08-08 DIAGNOSIS — E039 Hypothyroidism, unspecified: Secondary | ICD-10-CM | POA: Diagnosis present

## 2016-08-08 LAB — I-STAT TROPONIN, ED: Troponin i, poc: 0.85 ng/mL (ref 0.00–0.08)

## 2016-08-08 LAB — BASIC METABOLIC PANEL
Anion gap: 16 — ABNORMAL HIGH (ref 5–15)
BUN: 30 mg/dL — AB (ref 6–20)
CALCIUM: 9.2 mg/dL (ref 8.9–10.3)
CO2: 25 mmol/L (ref 22–32)
CREATININE: 3.79 mg/dL — AB (ref 0.44–1.00)
Chloride: 93 mmol/L — ABNORMAL LOW (ref 101–111)
GFR calc Af Amer: 12 mL/min — ABNORMAL LOW (ref >60)
GFR, EST NON AFRICAN AMERICAN: 10 mL/min — AB (ref >60)
Glucose, Bld: 141 mg/dL — ABNORMAL HIGH (ref 65–99)
Potassium: 3.8 mmol/L (ref 3.5–5.1)
SODIUM: 134 mmol/L — AB (ref 135–145)

## 2016-08-08 LAB — CBC
HCT: 37.7 % (ref 36.0–46.0)
Hemoglobin: 12.2 g/dL (ref 12.0–15.0)
MCH: 26.9 pg (ref 26.0–34.0)
MCHC: 32.4 g/dL (ref 30.0–36.0)
MCV: 83 fL (ref 78.0–100.0)
PLATELETS: 288 10*3/uL (ref 150–400)
RBC: 4.54 MIL/uL (ref 3.87–5.11)
RDW: 19.7 % — AB (ref 11.5–15.5)
WBC: 6.9 10*3/uL (ref 4.0–10.5)

## 2016-08-08 LAB — GLUCOSE, CAPILLARY
GLUCOSE-CAPILLARY: 109 mg/dL — AB (ref 65–99)
Glucose-Capillary: 100 mg/dL — ABNORMAL HIGH (ref 65–99)

## 2016-08-08 LAB — MRSA PCR SCREENING: MRSA by PCR: NEGATIVE

## 2016-08-08 MED ORDER — ISOSORBIDE MONONITRATE ER 30 MG PO TB24: 30.0000 mg | ORAL_TABLET | Freq: Every day | ORAL | Status: DC

## 2016-08-08 MED ORDER — PENTAFLUOROPROP-TETRAFLUOROETH EX AERO: 1.0000 "application " | INHALATION_SPRAY | CUTANEOUS | Status: DC | PRN

## 2016-08-08 MED ORDER — MIRTAZAPINE 15 MG PO TABS
15.0000 mg | ORAL_TABLET | Freq: Every evening | ORAL | Status: DC | PRN
  Administered 2016-08-13: 15 mg via ORAL
  Filled 2016-08-08 (×2): qty 1

## 2016-08-08 MED ORDER — PANTOPRAZOLE SODIUM 40 MG PO TBEC
40.0000 mg | DELAYED_RELEASE_TABLET | Freq: Two times a day (BID) | ORAL | Status: DC | PRN
  Administered 2016-08-09 – 2016-08-10 (×2): 40 mg via ORAL
  Filled 2016-08-08 (×2): qty 1

## 2016-08-08 MED ORDER — SODIUM CHLORIDE 0.9 % IV SOLN: 100.0000 mL | INTRAVENOUS | Status: DC | PRN

## 2016-08-08 MED ORDER — LIDOCAINE HCL (PF) 1 % IJ SOLN: 5.0000 mL | INTRAMUSCULAR | Status: DC | PRN

## 2016-08-08 MED ORDER — MIDODRINE HCL 5 MG PO TABS
10.0000 mg | ORAL_TABLET | Freq: Three times a day (TID) | ORAL | Status: DC
  Administered 2016-08-08 – 2016-08-11 (×8): 10 mg via ORAL
  Administered 2016-08-11: 5 mg via ORAL
  Administered 2016-08-11 – 2016-08-14 (×9): 10 mg via ORAL
  Filled 2016-08-08 (×18): qty 2

## 2016-08-08 MED ORDER — ALBUMIN HUMAN 25 % IV SOLN
INTRAVENOUS | Status: AC
  Administered 2016-08-08: 12.5 g via INTRAVENOUS
  Filled 2016-08-08: qty 50

## 2016-08-08 MED ORDER — SODIUM CHLORIDE 0.9 % IV SOLN: 250.0000 mL | INTRAVENOUS | Status: DC | PRN

## 2016-08-08 MED ORDER — LIDOCAINE-PRILOCAINE 2.5-2.5 % EX CREA: 1.0000 "application " | TOPICAL_CREAM | CUTANEOUS | Status: DC | PRN

## 2016-08-08 MED ORDER — NITROGLYCERIN 0.4 MG SL SUBL: 0.4000 mg | SUBLINGUAL_TABLET | SUBLINGUAL | Status: DC | PRN

## 2016-08-08 MED ORDER — HEPARIN SODIUM (PORCINE) 1000 UNIT/ML DIALYSIS: 1000.0000 [IU] | INTRAMUSCULAR | Status: DC | PRN

## 2016-08-08 MED ORDER — SODIUM CHLORIDE 0.9% FLUSH
3.0000 mL | Freq: Two times a day (BID) | INTRAVENOUS | Status: DC
  Administered 2016-08-08 – 2016-08-13 (×5): 3 mL via INTRAVENOUS

## 2016-08-08 MED ORDER — LEVOTHYROXINE SODIUM 50 MCG PO TABS
50.0000 ug | ORAL_TABLET | Freq: Every day | ORAL | Status: DC
  Administered 2016-08-09 – 2016-08-10 (×2): 50 ug via ORAL
  Filled 2016-08-08 (×2): qty 1

## 2016-08-08 MED ORDER — MIDODRINE HCL 5 MG PO TABS
5.0000 mg | ORAL_TABLET | ORAL | Status: DC
  Administered 2016-08-09 – 2016-08-13 (×4): 5 mg via ORAL

## 2016-08-08 MED ORDER — ALBUMIN HUMAN 25 % IV SOLN
12.5000 g | Freq: Once | INTRAVENOUS | Status: AC
  Administered 2016-08-08: 12.5 g via INTRAVENOUS
  Filled 2016-08-08: qty 50

## 2016-08-08 MED ORDER — SODIUM CHLORIDE 0.9% FLUSH
3.0000 mL | Freq: Two times a day (BID) | INTRAVENOUS | Status: DC
  Administered 2016-08-10 – 2016-08-13 (×4): 3 mL via INTRAVENOUS

## 2016-08-08 MED ORDER — SODIUM CHLORIDE 0.9% FLUSH: 3.0000 mL | INTRAVENOUS | Status: DC | PRN

## 2016-08-08 MED ORDER — ALTEPLASE 2 MG IJ SOLR: 2.0000 mg | Freq: Once | INTRAMUSCULAR | Status: DC | PRN

## 2016-08-08 MED ORDER — ACETAMINOPHEN 650 MG RE SUPP: 650.0000 mg | Freq: Four times a day (QID) | RECTAL | Status: DC | PRN

## 2016-08-08 MED ORDER — APIXABAN 2.5 MG PO TABS
2.5000 mg | ORAL_TABLET | Freq: Two times a day (BID) | ORAL | Status: DC
  Administered 2016-08-08 – 2016-08-14 (×12): 2.5 mg via ORAL
  Filled 2016-08-08 (×13): qty 1

## 2016-08-08 MED ORDER — ACETAMINOPHEN 325 MG PO TABS: 650.0000 mg | ORAL_TABLET | Freq: Four times a day (QID) | ORAL | Status: DC | PRN

## 2016-08-08 MED ORDER — HYDROXYZINE HCL 25 MG PO TABS
25.0000 mg | ORAL_TABLET | Freq: Three times a day (TID) | ORAL | Status: DC | PRN
  Administered 2016-08-09 – 2016-08-14 (×7): 25 mg via ORAL
  Filled 2016-08-08 (×10): qty 1

## 2016-08-08 NOTE — ED Provider Notes (Signed)
[facility] DEPT Provider Note   CSN: [id number] Arrival date & time: 08/08/16  1133     History   Chief Complaint Chief Complaint  Patient presents with  . Shortness of Breath    HPI Wendy Kerr is a 83 y.o. female.  HPI Patient presents with shortness of breath and increased fluid. Has had some worsening shortness of breath over the last few days. She is a dialysis patient. Has not been able to get dialysis the last 2 times due to hypotension. She last had dialysis week ago. Her weight is up. No real cough. Occasional chest pain. Has had to take Midrin to try and get dialysis and they have not been able to do it. Pressures of reportedly been running low. Has had increasing abdominal swelling 2. History of requiring paracentesis.   Past Medical History:  Diagnosis Date  . 1st degree AV block   . Anemia   . Atrial flutter ([place])   . Benign neoplasm of pancreas, except islets of Langerhans   . Bronchitis, mucopurulent recurrent ([place])   . Cataract    B/L  . CHF (congestive heart failure) ([place])   . Chronic kidney disease   . Cirrhosis of liver ([place])    " just a little"  . Common migraine   . Diabetes mellitus ([place])   . Diverticulosis of colon   . DJD (degenerative joint disease)   . Generalized anxiety disorder   . GERD (gastroesophageal reflux disease)   . Hypercholesterolemia   . Hypertension   . Monoclonal gammopathy   . Obesity   . OSA (obstructive sleep apnea)    severe with AHI 37 events per hour  . Vitamin D deficiency     Patient Active Problem List   Diagnosis Date Noted  . Cough 06/16/2016  . Abnormal TSH 03/16/2016  . Itching 01/24/2016  . Poisoning by digoxin 12/22/2015  . Preoperative cardiovascular examination 12/22/2015  . SBO (small bowel obstruction) ([place])   . Nausea 12/18/2015  . Routine general medical examination at a health care facility 02/11/2015  . Vitamin D deficiency 11/30/2014  . Anemia, iron deficiency 11/30/2014  . Atrial  flutter ([place]) 06/28/2014  . Right bundle branch block 06/22/2014  . CKD stage 3 due to type 2 diabetes mellitus ([place]) 06/21/2014  . OSA (obstructive sleep apnea)   . Pancreatic cystadenoma 01/26/2014  . GERD (gastroesophageal reflux disease) 01/01/2014  . Chronic combined systolic and diastolic heart failure ([place]) 01/21/2013    Class: Acute  . BEN NEOPLASM PANCREAS EXCEPT ISLETS LANGERHANS 08/16/2007  . Diabetes mellitus type 2 in obese ([place]) 02/10/2007  . HYPERCHOLESTEROLEMIA 02/10/2007  . MONOCLONAL GAMMOPATHY 02/10/2007  . Obesity 02/10/2007  . Essential hypertension 02/10/2007  . ANXIETY DISORDER, GENERALIZED 12/07/2006  . DEGENERATIVE JOINT DISEASE 12/07/2006  . Serous cystadenoma 08/31/2001    Past Surgical History:  Procedure Laterality Date  . ABDOMINAL HYSTERECTOMY  1972  . AV FISTULA PLACEMENT Right 07/13/2016   Procedure: Creation Arteriovenous Fistula Right Arm;  Surgeon: Angelia Mould, MD;  Location: [place];  Service: Vascular;  Laterality: Right;  . COLONOSCOPY    . INSERTION OF DIALYSIS CATHETER Right 07/20/2016   Procedure: INSERTION OF TUNNELED DIALYSIS CATHETER - RIGHT INTERNAL JUGULAR;  Surgeon: Angelia Mould, MD;  Location: [place];  Service: Vascular;  Laterality: Right;  . LAPAROTOMY N/A 12/23/2015   Procedure: EXPLORATORY LAPAROTOMY FOR SMALL BOWEL OBSTRUCTION;  Surgeon: Judeth Horn, MD;  Location: [place];  Service: General;  Laterality: N/A;  .  resection serous cystadenoma of pancreas  2004   at [facility].  Now (2017) with Dr Bridgett Larsson with [facility]    OB History    No data available       Home Medications    Prior to Admission medications   Medication Sig Start Date End Date Taking? Authorizing Provider  atorvastatin (LIPITOR) 20 MG tablet Take 1 tablet (20 mg total) by mouth daily at 6 PM. Patient taking differently: Take 20 mg by mouth every other day.  11/23/15   Cheryln Manly, NP  ELIQUIS 2.5 MG TABS tablet TAKE 1 TABLET BY MOUTH 2 TIMES  DAILY. 07/18/16   Belva Crome, MD  hydrALAZINE (APRESOLINE) 25 MG tablet Take 0.5 tablets (12.5 mg total) by mouth 2 (two) times daily. 01/01/16   Barton Dubois, MD  hydrOXYzine (ATARAX/VISTARIL) 25 MG tablet TAKE 1 TABLET (25 MG TOTAL) BY MOUTH 3 (THREE) TIMES DAILY AS NEEDED. Patient taking differently: Take 25 mg by mouth 3 (three) times daily as needed for anxiety or itching.  03/27/16   Hoyt Koch, MD  IRON PO Take 1 tablet by mouth daily.     [provider]  isosorbide mononitrate (IMDUR) 60 MG 24 hr tablet Take 0.5 tablets (30 mg total) by mouth daily. 02/13/16   Hoyt Koch, MD  levothyroxine (SYNTHROID) 50 MCG tablet Take 1 tablet (50 mcg total) by mouth daily before breakfast. 06/29/16   Belva Crome, MD  mirtazapine (REMERON) 15 MG tablet Take 15 mg by mouth at bedtime as needed (for itching or sleep).  06/29/16   [provider]  Multiple Vitamin (MULTIVITAMIN WITH MINERALS) TABS tablet Take 1 tablet by mouth every morning.    [provider]  nitroGLYCERIN (NITROSTAT) 0.4 MG SL tablet Place 1 tablet (0.4 mg total) under the tongue every 5 (five) minutes as needed for chest pain. 08/13/14   Hoyt Koch, MD  oxyCODONE-acetaminophen (PERCOCET/ROXICET) 5-325 MG tablet Take 1 tablet by mouth every 6 (six) hours as needed. 07/13/16   Ulyses Amor, PA-C  oxyCODONE-acetaminophen (ROXICET) 5-325 MG tablet Take 1 tablet by mouth every 4 (four) hours as needed. 07/20/16   Angelia Mould, MD  pantoprazole (PROTONIX) 40 MG tablet Take 40 mg by mouth 2 (two) times daily as needed (for acid reflux).  07/06/16   [provider]  torsemide (DEMADEX) 20 MG tablet Take 1 tablet (20 mg total) by mouth 2 (two) times daily. 06/26/16   Hoyt Koch, MD  Vitamin D, Ergocalciferol, (DRISDOL) 50000 units CAPS capsule Take 1 capsule (50,000 Units total) by mouth every 7 (seven) days. Patient taking differently: Take 50,000 Units by  mouth every Monday.  01/13/16   Hoyt Koch, MD    Family History Family History  Problem Relation Age of Onset  . Hypertension Mother   . Kidney failure Mother        dialysis  . Heart Problems Mother 7  . Hypertension Father   . Kidney failure Father        dialysis  . Heart Problems Father 64  . Kidney disease Sister   . Kidney disease Other     Social History Social History  Substance Use Topics  . Smoking status: Never Smoker  . Smokeless tobacco: Never Used  . Alcohol use No     Allergies   Piroxicam   Review of Systems Review of Systems  Constitutional: Positive for fatigue. Negative for appetite change and fever.  HENT: Negative  for congestion.   Respiratory: Positive for shortness of breath.   Cardiovascular: Positive for leg swelling. Negative for chest pain.  Gastrointestinal: Positive for abdominal distention and abdominal pain.  Genitourinary: Negative for dysuria.  Musculoskeletal: Negative for back pain.  Skin: Negative for rash.  Neurological: Negative for seizures.  Hematological: Negative for adenopathy.     Physical Exam Updated Vital Signs BP 112/72 (BP Location: Right Arm)   Pulse 85   Temp 97.8 F (36.6 C) (Oral)   Resp 20   SpO2 100%   Physical Exam  Constitutional: She appears well-developed.  HENT:  Head: Normocephalic.  Eyes: EOM are normal.  Cardiovascular: Normal rate.   Pulmonary/Chest:  Scattered rales at the bases. Dialysis catheter to right chest wall.  Abdominal: She exhibits distension.  Patient with anasarca. No real tenderness.  Musculoskeletal:  Moderate to severe pitting edema bilateral lower extremities. Also edema to right upper extremity. Dialysis fistula in right upper extremity also.  Skin: Skin is warm.  Psychiatric: She has a normal mood and affect.     ED Treatments / Results  Labs (all labs ordered are listed, but only abnormal results are displayed) Labs Reviewed  BASIC METABOLIC  PANEL - Abnormal; Notable for the following:       Result Value   Sodium 134 (*)    Chloride 93 (*)    Glucose, Bld 141 (*)    BUN 30 (*)    Creatinine, Ser 3.79 (*)    GFR calc non Af Amer 10 (*)    GFR calc Af Amer 12 (*)    Anion gap 16 (*)    All other components within normal limits  CBC - Abnormal; Notable for the following:    RDW 19.7 (*)    All other components within normal limits  I-STAT TROPOININ, ED - Abnormal; Notable for the following:    Troponin i, poc 0.85 (*)    All other components within normal limits    EKG  EKG Interpretation  Date/Time:  Wednesday August 08 2016 11:38:25 EDT Ventricular Rate:  87 PR Interval:  224 QRS Duration: 144 QT Interval:  458 QTC Calculation: 551 R Axis:   -63 Text Interpretation:  Sinus rhythm with 1st degree A-V block with occasional Premature ventricular complexes Left axis deviation Non-specific intra-ventricular conduction block Inferior infarct , age undetermined Anterolateral infarct , age undetermined Abnormal ECG Confirmed by Alvino Chapel  MD, Quetzal Meany [phone]) on 08/08/2016 12:03:13 PM       Radiology Dg Chest 2 View  Result Date: 08/08/2016 CLINICAL DATA:  Hypotension during the past to dialysis treatments limiting removal of fluid. The patient for shortness of breath and swelling today. History of CHF. EXAM: CHEST  2 VIEW COMPARISON:  Portable chest x-ray of Jul 20, 2016 FINDINGS: The lungs are well-expanded. The interstitial markings are mildly prominent as compared to the previous study. There are small bilateral pleural effusions greatest on the left. The cardiac silhouette remains enlarged. The pulmonary vascularity is more engorged today. There is a dual-lumen dialysis catheter in place with the tip over the distal third of the SVC. There is calcification in the wall of the aortic arch. The observed bony thorax is unremarkable. IMPRESSION: CHF manifested by cardiomegaly and small bilateral pleural effusions. Only mild  interstitial edema is observed. Electronically Signed   By: David  Martinique M.D.   On: 08/08/2016 12:43    Procedures Procedures (including critical care time)  Medications Ordered in ED Medications - No data to display  Initial Impression / Assessment and Plan / ED Course  I have reviewed the triage vital signs and the nursing notes.  Pertinent labs & imaging results that were available during my care of the patient were reviewed by me and considered in my medical decision making (see chart for details).     Patient shortness of breath and anasarca. Dialysis patient that his been unable to dialyze the last 2 times due to hypotension. X-ray shows CHF. Not hyperkalemic. With difficulty dialyzing will admit to internal medicine.  Final Clinical Impressions(s) / ED Diagnoses   Final diagnoses:  Anasarca  End stage renal disease on dialysis [facility])    New Prescriptions New Prescriptions   No medications on file     Davonna Belling, MD 08/08/16 1317

## 2016-08-08 NOTE — Consult Note (Signed)
[facility] Renal Consultation Note    Indication for Consultation:  Management of ESRD/hemodialysis; anemia, hypertension/volume and secondary hyperparathyroidism PCP:  HPI: Wendy Kerr is a 83 y.o. female with ESRD 2/2 DM/HTN, cardiorenal syndrome. First HD 07/23/2016. PMH significant for severe R sided HF, cirrhosis of liver 2/2 HF, Afib on eliquis, MGUS, GERD, OSA, benign pancreatic mass.  Patient was brought to ED for evaluation of worsening SOB and edema for past two weeks. Patient has not missed any hemodialysis treatments but volume removal has been severely limited D/T hypotension for which she had been prescribed midodrine.  CXR on arrival to ED shows cardiomegaly and small bilateral pleural effusions with only mild interstitial edema. She was afebrile on admission, BP 112/72 HR 85 RR 20. O2 Sats 100% on RA. EKG shows SR 1 degree AVB L axis deviation. WBC 6.9 HGB 12.2 PLT 288 Na 134 K+ 3.8 BS 141 Troponin 0.85  Patient states she just feels "bloated". She denies chest pain, +DOE, LE edema, SOB, weakness. Denies fever, chills, N,V,D, abdominal pain, melena or tarry stools, CVAT, headache, vision or hearing changes, recent falls. She says she has been taking midodrine as directed but hasn't been able to get the fluid off at dialysis because of hypotension. Son is at bedside.   Past Medical History:  Diagnosis Date  . 1st degree AV block   . Anemia   . Atrial flutter ([place])   . Benign neoplasm of pancreas, except islets of Langerhans   . Bronchitis, mucopurulent recurrent ([place])   . Cataract    B/L  . CHF (congestive heart failure) ([place])   . Chronic kidney disease   . Cirrhosis of liver ([place])    " just a little"  . Common migraine   . Diabetes mellitus ([place])   . Diverticulosis of colon   . DJD (degenerative joint disease)   . Generalized anxiety disorder   . GERD (gastroesophageal reflux disease)   . Hypercholesterolemia   . Hypertension   . Monoclonal  gammopathy   . Obesity   . OSA (obstructive sleep apnea)    severe with AHI 37 events per hour  . Vitamin D deficiency    Past Surgical History:  Procedure Laterality Date  . ABDOMINAL HYSTERECTOMY  1972  . AV FISTULA PLACEMENT Right 07/13/2016   Procedure: Creation Arteriovenous Fistula Right Arm;  Surgeon: Angelia Mould, MD;  Location: [place];  Service: Vascular;  Laterality: Right;  . COLONOSCOPY    . INSERTION OF DIALYSIS CATHETER Right 07/20/2016   Procedure: INSERTION OF TUNNELED DIALYSIS CATHETER - RIGHT INTERNAL JUGULAR;  Surgeon: Angelia Mould, MD;  Location: [place];  Service: Vascular;  Laterality: Right;  . LAPAROTOMY N/A 12/23/2015   Procedure: EXPLORATORY LAPAROTOMY FOR SMALL BOWEL OBSTRUCTION;  Surgeon: Judeth Horn, MD;  Location: [place];  Service: General;  Laterality: N/A;  . resection serous cystadenoma of pancreas  2004   at [facility].  Now (2017) with Dr Bridgett Larsson with [facility]   Family History  Problem Relation Age of Onset  . Hypertension Mother   . Kidney failure Mother        dialysis  . Heart Problems Mother 46  . Hypertension Father   . Kidney failure Father        dialysis  . Heart Problems Father 59  . Kidney disease Sister   . Kidney disease Other    Social History:  reports that she has never smoked. She has never used smokeless tobacco. She reports  that she does not drink alcohol or use drugs. Allergies  Allergen Reactions  . Piroxicam Other (See Comments)    REACTION: HEADACHE   Prior to Admission medications   Medication Sig Start Date End Date Taking? Authorizing Provider  atorvastatin (LIPITOR) 20 MG tablet Take 1 tablet (20 mg total) by mouth daily at 6 PM. Patient taking differently: Take 20 mg by mouth every other day.  11/23/15   Cheryln Manly, NP  ELIQUIS 2.5 MG TABS tablet TAKE 1 TABLET BY MOUTH 2 TIMES DAILY. 07/18/16   Belva Crome, MD  hydrALAZINE (APRESOLINE) 25 MG tablet Take 0.5 tablets (12.5 mg total) by mouth 2 (two)  times daily. 01/01/16   Barton Dubois, MD  hydrOXYzine (ATARAX/VISTARIL) 25 MG tablet TAKE 1 TABLET (25 MG TOTAL) BY MOUTH 3 (THREE) TIMES DAILY AS NEEDED. Patient taking differently: Take 25 mg by mouth 3 (three) times daily as needed for anxiety or itching.  03/27/16   Hoyt Koch, MD  IRON PO Take 1 tablet by mouth daily.     [provider]  isosorbide mononitrate (IMDUR) 60 MG 24 hr tablet Take 0.5 tablets (30 mg total) by mouth daily. 02/13/16   Hoyt Koch, MD  levothyroxine (SYNTHROID) 50 MCG tablet Take 1 tablet (50 mcg total) by mouth daily before breakfast. 06/29/16   Belva Crome, MD  mirtazapine (REMERON) 15 MG tablet Take 15 mg by mouth at bedtime as needed (for itching or sleep).  06/29/16   [provider]  Multiple Vitamin (MULTIVITAMIN WITH MINERALS) TABS tablet Take 1 tablet by mouth every morning.    [provider]  nitroGLYCERIN (NITROSTAT) 0.4 MG SL tablet Place 1 tablet (0.4 mg total) under the tongue every 5 (five) minutes as needed for chest pain. 08/13/14   Hoyt Koch, MD  oxyCODONE-acetaminophen (PERCOCET/ROXICET) 5-325 MG tablet Take 1 tablet by mouth every 6 (six) hours as needed. 07/13/16   Ulyses Amor, PA-C  oxyCODONE-acetaminophen (ROXICET) 5-325 MG tablet Take 1 tablet by mouth every 4 (four) hours as needed. 07/20/16   Angelia Mould, MD  pantoprazole (PROTONIX) 40 MG tablet Take 40 mg by mouth 2 (two) times daily as needed (for acid reflux).  07/06/16   [provider]  torsemide (DEMADEX) 20 MG tablet Take 1 tablet (20 mg total) by mouth 2 (two) times daily. 06/26/16   Hoyt Koch, MD  Vitamin D, Ergocalciferol, (DRISDOL) 50000 units CAPS capsule Take 1 capsule (50,000 Units total) by mouth every 7 (seven) days. Patient taking differently: Take 50,000 Units by mouth every Monday.  01/13/16   Hoyt Koch, MD   Current Facility-Administered Medications  Medication Dose Route  Frequency Provider Last Rate Last Dose  . 0.9 %  sodium chloride infusion  250 mL Intravenous PRN Lucila Maine C, DO      . acetaminophen (TYLENOL) tablet 650 mg  650 mg Oral Q6H PRN Lucila Maine C, DO       Or  . acetaminophen (TYLENOL) suppository 650 mg  650 mg Rectal Q6H PRN Steve Rattler, DO      . apixaban (ELIQUIS) tablet 2.5 mg  2.5 mg Oral BID Riccio, Angela C, DO      . hydrOXYzine (ATARAX/VISTARIL) tablet 25 mg  25 mg Oral TID PRN Lucila Maine C, DO      . isosorbide mononitrate (IMDUR) 24 hr tablet 30 mg  30 mg Oral Daily Riccio, Angela C, DO      . [  START ON 08/09/2016] levothyroxine (SYNTHROID, LEVOTHROID) tablet 50 mcg  50 mcg Oral QAC breakfast Riccio, Angela C, DO      . midodrine (PROAMATINE) tablet 10 mg  10 mg Oral TID WC Riccio, Angela C, DO      . mirtazapine (REMERON) tablet 15 mg  15 mg Oral QHS PRN Riccio, Angela C, DO      . nitroGLYCERIN (NITROSTAT) SL tablet 0.4 mg  0.4 mg Sublingual Q5 min PRN Riccio, Angela C, DO      . pantoprazole (PROTONIX) EC tablet 40 mg  40 mg Oral BID PRN Riccio, Angela C, DO      . sodium chloride flush (NS) 0.9 % injection 3 mL  3 mL Intravenous Q12H Riccio, Angela C, DO      . sodium chloride flush (NS) 0.9 % injection 3 mL  3 mL Intravenous Q12H Riccio, Angela C, DO      . sodium chloride flush (NS) 0.9 % injection 3 mL  3 mL Intravenous PRN Steve Rattler, DO       Labs: Basic Metabolic Panel:  Recent Labs Lab 08/08/16 1220  NA 134*  K 3.8  CL 93*  CO2 25  GLUCOSE 141*  BUN 30*  CREATININE 3.79*  CALCIUM 9.2   Liver Function Tests: No results for input(s): AST, ALT, ALKPHOS, BILITOT, PROT, ALBUMIN in the last 168 hours. No results for input(s): LIPASE, AMYLASE in the last 168 hours. No results for input(s): AMMONIA in the last 168 hours. CBC:  Recent Labs Lab 08/08/16 1220  WBC 6.9  HGB 12.2  HCT 37.7  MCV 83.0  PLT 288   Cardiac Enzymes: No results for input(s): CKTOTAL, CKMB, CKMBINDEX, TROPONINI in  the last 168 hours. CBG: No results for input(s): GLUCAP in the last 168 hours. Iron Studies: No results for input(s): IRON, TIBC, TRANSFERRIN, FERRITIN in the last 72 hours. Studies/Results: Dg Chest 2 View  Result Date: 08/08/2016 CLINICAL DATA:  Hypotension during the past to dialysis treatments limiting removal of fluid. The patient for shortness of breath and swelling today. History of CHF. EXAM: CHEST  2 VIEW COMPARISON:  Portable chest x-ray of Jul 20, 2016 FINDINGS: The lungs are well-expanded. The interstitial markings are mildly prominent as compared to the previous study. There are small bilateral pleural effusions greatest on the left. The cardiac silhouette remains enlarged. The pulmonary vascularity is more engorged today. There is a dual-lumen dialysis catheter in place with the tip over the distal third of the SVC. There is calcification in the wall of the aortic arch. The observed bony thorax is unremarkable. IMPRESSION: CHF manifested by cardiomegaly and small bilateral pleural effusions. Only mild interstitial edema is observed. Electronically Signed   By: David  Martinique M.D.   On: 08/08/2016 12:43    ROS: As per HPI otherwise negative.   Physical Exam: Vitals:   08/08/16 1137 08/08/16 1214 08/08/16 1215  BP: 112/72  111/72  Pulse: 85  80  Resp: 20  15  Temp:  97.8 F (36.6 C)   TempSrc:  Oral   SpO2: 100%  100%     General: Well developed, well nourished, in no acute distress. Head: Normocephalic, atraumatic, sclera non-icteric, mucus membranes are moist Neck: Supple. JVD not elevated. Lungs: Bilateral breath sounds decreased in bases otherwise CTA A/P. No WOB.  Heart: RRR with S1 S2, +S4. SR 1st AVB on monitor rate 80s.  Abdomen: Soft, non-tender, non-distended with normoactive bowel sounds. No rebound/guarding. No obvious abdominal masses.  M-S:  Strength and tone appear normal for age. Lower extremities: Patient has 2-3+ pitting edema from lower abdomen to feet  bilaterally, R slightly worse then L. Also has sacral edema.  Neuro: Alert and oriented X 3. Moves all extremities spontaneously. Psych:  Responds to questions appropriately with a normal affect. Dialysis Access: [facility] Drsg CDI.   Dialysis Orders: [facility] MWF 4 hrs 200/500 EDW 79.5 kg 2.0 K/2.0 Ca Linear Na -No heparin -Hectorol 3 mcg IV TIW -Venofer 50 mg IV TIW Last labs: HGB 10.3 08/01/16 Ferritin 548 Fe 103 Tsat 40 Ca 8.8 C Ca 9.2 Phos 7.8 PTH 752 (07/23/16).   BMD meds: -Calcium Acetate 667 mg 2 tabs PO TID AC  Assessment/Plan: 1.  Volume overload D/T inability to remove volume D/T hypotension: Will have serial hemodialysis for volume removal. Increase midodrine to 10 mg PO prior to HD and 5 mg PO mid treatment. Max volume removal as tolerated.  2. H/O Chronic CHF. BNP not useful in ESRD. Does not appear volume overloaded on CXR. Monitor.  3.  ESRD -  MWF. K+ 3.8 Use 3.0 K bath. No heparin.  4. Elevated Troponin: probably demand ischemia. Per primary.  5.  Hypotension/volume  -as noted above. Midodrine recently increased to 10 mg PO TID.  6.  Anemia  - HGB stable. No ESA needed. Continue weekly Fe. 7.  Metabolic bone disease -  Add renal function panel to today's labs. Cont. Binders/VDRA 8.  Nutrition - Renal diet/carb mod diet, renal vit/nepro 9.  DM: Diet controlled, no meds.  10. H/O Afib/Alfutter: Continue Apixaban.    Rita H. Owens Shark, NP-C 08/08/2016, 3:49 PM  [facility] Beeper [phone]  Pt seen, examined and agree w A/P as above. ESRD patient recently started dialysis here with acute/ chronic hypotension and vol overload, likely R HF/ cor pulmonale- type situation, has had difficulty getting fluid / edema down due to hypotension.  Plan HD tonight, cont midodrine.  Kelly Splinter MD [employer] pager [phone]   08/08/2016, 4:56 PM

## 2016-08-08 NOTE — H&P (Signed)
[facility] Admission History and Physical Service Pager: [phone]  Patient name: Wendy Kerr Medical record number: [id number] Date of birth: 09-Dec-1930 Age: 83 y.o. Gender: female  Primary Care Provider: System, Pcp Not In Consultants: nephrology  Code Status: Full- confirmed on admission  Chief Complaint: dyspnea  Assessment and Plan: YERLIN GASPARYAN is a 83 y.o. female presenting with dyspnea . PMH is significant for ESRD on dialysis MWF, CHF (EF 40-45%), GAD, GERD, HTN, HLD, T2DM, anemia.  #Dyspnea- progressive over the past 2 weeks, likely in the setting of fluid overload either from 2 missed dialysis sessions vs CHF exacerbation. Patient also has a component of liver cirrhosis recently diagnosed and has had paracentesis in past. No oxygen requirement. Lungs with crackles on exam. CXR with cardiomegaly and small bilateral pleural effusions with mild interstitial edema. Pitting edema up to abdomen, and abdomen distended and feels tight, per patient.  -admit to inpatient, attending Dr. Gwendlyn Deutscher -monitor on telemetry -vitals per floor -am CBC/CMP -will consult nephrology due to need for dialysis -midodrine 10mg  TID -monitor respiratory status  -will get abdominal US to assess for ascites -consider c/s to IR for paracentesis  #ESRD - Nephrology consulted, appreciate recommendations  #Elevated troponin- troponin chronically elevated in past, 0.85 in ED. No EKG changes noted. No chest pain. Likely demand ischemia. -would trend troponins if develops chest pain or EKG changes.  -monitor on telemetry -am EKG -consider cards c/s if troponin increases   #Acute on chronic CHF- last echo September 2017 showed EF 40-45% with mild to moderate reduced systolic function, no wall motion abnormalities. Severe concentric hypertrophy present, right systolic pressure consistent with moderate pulmonary hypertension -check BNP -recheck ECHO   #Pruritis - was  told this was related to renal function and would improve with dialysis. Mostly on lower back.  -continue home remeron and hydroxyzine  #HTN- has been hypotensive over past 1-2 weeks preventing her from getting dialysis. On Hydralazine 12.5 mg BID at home -hold home BP meds -monitor BP -midodrine as above  #HLD- takes atorvastatin 20mg  qod -continue home meds  #T2DM- not on any outpatient medications, last A1C June 2016 was 6.6 -check A1C -fasting CBG every morning  #AFib/flutter- hx of arrythmias, on eliquis 2.5 BID, followed by cardiology [facility] -continue eliquis -monitor on telemetry  #GERD- on protonix 40 mg BID -continue home medication  #Pancreatic cystadenoma -followed by [facility], follow up with them as outpatient  #Elevated TSH- Was 10.05 on 06/26/16. Takes levoxyl 50 ug per day. Unclear if true hypothyroidism vs low T3 syndrome in CKD.  - Repeat TSH  FEN/GI: carb modified/renal diet Prophylaxis: on eliquis  Disposition: pending clinical improvement, admit to renal floor for dialysis  History of Present Illness:  Wendy Kerr is a 83 y.o. female presenting with dyspnea.   Patient just started dialysis 3 weeks ago but recently missed her past 2 sessions due to low blood pressures. She is supposed to be on MWF schedule but this morning due to SOB son decided to take her to ED instead of to dialysis. She was on Midodrine 10 mg on days prior to dialysis but this was increased on Monday to 10 mg TID. She took 10 mg TID 6/12 and one dose this morning. She has had swelling up her legs and in her right arm where her fistula is. Her son has been putting compression stockings on legs.   Per son and patient her SOB has been getting progressively worse over the past  2 weeks and now she is short of breath with just sitting up. She has required IV diuretics in the hospital in the past and has also had a paracentesis in the past due to abdominal fluid. Last month Dr. Lorrene Reid her  nephrologist did an [place] to assess abdominal distention and patient was diagnosed with a component of cirrhosis, although no history of alcohol use.   Son states she is usually healthy and quite independent. Health has declined in past year. Recently however she has had trouble walking. Would like to be able to get around more easily. Son has started home PT sessions for her on non-dialysis days and is interested in getting a home health aid for her as well.   Review Of Systems: Per HPI with the following additions:   Review of Systems  Constitutional: Negative for chills and fever.       Decreased appetite.  Eyes:       Vision loss from cataracts bilaterally   Respiratory: Positive for cough and shortness of breath.   Cardiovascular: Positive for leg swelling. Negative for chest pain.  Gastrointestinal: Positive for nausea. Negative for abdominal pain and vomiting.  Genitourinary:       Does not make urine.   Musculoskeletal: Negative for back pain and myalgias.  Skin: Positive for itching.  Neurological: Positive for dizziness. Negative for headaches.  Psychiatric/Behavioral: Negative for substance abuse.    Patient Active Problem List   Diagnosis Date Noted  . Fluid overload 08/08/2016  . Cough 06/16/2016  . Abnormal TSH 03/16/2016  . Itching 01/24/2016  . Poisoning by digoxin 12/22/2015  . Preoperative cardiovascular examination 12/22/2015  . SBO (small bowel obstruction) ([place])   . Nausea 12/18/2015  . Routine general medical examination at a health care facility 02/11/2015  . Vitamin D deficiency 11/30/2014  . Anemia, iron deficiency 11/30/2014  . Atrial flutter ([place]) 06/28/2014  . Right bundle branch block 06/22/2014  . CKD stage 3 due to type 2 diabetes mellitus ([place]) 06/21/2014  . OSA (obstructive sleep apnea)   . Pancreatic cystadenoma 01/26/2014  . GERD (gastroesophageal reflux disease) 01/01/2014  . Chronic combined systolic and diastolic heart failure ([place])  01/21/2013    Class: Acute  . BEN NEOPLASM PANCREAS EXCEPT ISLETS LANGERHANS 08/16/2007  . Diabetes mellitus type 2 in obese ([place]) 02/10/2007  . HYPERCHOLESTEROLEMIA 02/10/2007  . MONOCLONAL GAMMOPATHY 02/10/2007  . Obesity 02/10/2007  . Essential hypertension 02/10/2007  . ANXIETY DISORDER, GENERALIZED 12/07/2006  . DEGENERATIVE JOINT DISEASE 12/07/2006  . Serous cystadenoma 08/31/2001    Past Medical History: Past Medical History:  Diagnosis Date  . 1st degree AV block   . Anemia   . Atrial flutter ([place])   . Benign neoplasm of pancreas, except islets of Langerhans   . Bronchitis, mucopurulent recurrent ([place])   . Cataract    B/L  . CHF (congestive heart failure) ([place])   . Chronic kidney disease   . Cirrhosis of liver ([place])    " just a little"  . Common migraine   . Diabetes mellitus ([place])   . Diverticulosis of colon   . DJD (degenerative joint disease)   . Generalized anxiety disorder   . GERD (gastroesophageal reflux disease)   . Hypercholesterolemia   . Hypertension   . Monoclonal gammopathy   . Obesity   . OSA (obstructive sleep apnea)    severe with AHI 37 events per hour  . Vitamin D deficiency     Past Surgical History: Past Surgical History:  Procedure Laterality Date  . ABDOMINAL HYSTERECTOMY  1972  . AV FISTULA PLACEMENT Right 07/13/2016   Procedure: Creation Arteriovenous Fistula Right Arm;  Surgeon: Angelia Mould, MD;  Location: [facility];  Service: Vascular;  Laterality: Right;  . COLONOSCOPY    . INSERTION OF DIALYSIS CATHETER Right 07/20/2016   Procedure: INSERTION OF TUNNELED DIALYSIS CATHETER - RIGHT INTERNAL JUGULAR;  Surgeon: Angelia Mould, MD;  Location: [facility];  Service: Vascular;  Laterality: Right;  . LAPAROTOMY N/A 12/23/2015   Procedure: EXPLORATORY LAPAROTOMY FOR SMALL BOWEL OBSTRUCTION;  Surgeon: Judeth Horn, MD;  Location: [facility];  Service: General;  Laterality: N/A;  . resection serous cystadenoma of pancreas  2004   at  [facility].  Now (2017) with Dr Bridgett Larsson with [facility]    Social History: Social History  Substance Use Topics  . Smoking status: Never Smoker  . Smokeless tobacco: Never Used  . Alcohol use No   Additional social history: lives alone, son lives very close by and is very involved with care- checks in on her multiple times a day. Has PT on Tues/Thurs  Please also refer to relevant sections of EMR.  Family History: Family History  Problem Relation Age of Onset  . Hypertension Mother   . Kidney failure Mother        dialysis  . Heart Problems Mother 17  . Hypertension Father   . Kidney failure Father        dialysis  . Heart Problems Father 72  . Kidney disease Sister   . Kidney disease Other    Allergies and Medications: Allergies  Allergen Reactions  . Piroxicam Other (See Comments)    REACTION: HEADACHE   No current facility-administered medications on file prior to encounter.    Current Outpatient Prescriptions on File Prior to Encounter  Medication Sig Dispense Refill  . atorvastatin (LIPITOR) 20 MG tablet Take 1 tablet (20 mg total) by mouth daily at 6 PM. (Patient taking differently: Take 20 mg by mouth every other day. ) 30 tablet 6  . ELIQUIS 2.5 MG TABS tablet TAKE 1 TABLET BY MOUTH 2 TIMES DAILY. 60 tablet 11  . hydrALAZINE (APRESOLINE) 25 MG tablet Take 0.5 tablets (12.5 mg total) by mouth 2 (two) times daily.    . hydrOXYzine (ATARAX/VISTARIL) 25 MG tablet TAKE 1 TABLET (25 MG TOTAL) BY MOUTH 3 (THREE) TIMES DAILY AS NEEDED. (Patient taking differently: Take 25 mg by mouth 3 (three) times daily as needed for anxiety or itching. ) 90 tablet 0  . IRON PO Take 1 tablet by mouth daily.     . isosorbide mononitrate (IMDUR) 60 MG 24 hr tablet Take 0.5 tablets (30 mg total) by mouth daily. 15 tablet 5  . levothyroxine (SYNTHROID) 50 MCG tablet Take 1 tablet (50 mcg total) by mouth daily before breakfast. 90 tablet 0  . mirtazapine (REMERON) 15 MG tablet Take 15 mg by mouth at  bedtime as needed (for itching or sleep).   2  . Multiple Vitamin (MULTIVITAMIN WITH MINERALS) TABS tablet Take 1 tablet by mouth every morning.    . nitroGLYCERIN (NITROSTAT) 0.4 MG SL tablet Place 1 tablet (0.4 mg total) under the tongue every 5 (five) minutes as needed for chest pain. 5 tablet 3  . oxyCODONE-acetaminophen (PERCOCET/ROXICET) 5-325 MG tablet Take 1 tablet by mouth every 6 (six) hours as needed. 6 tablet 0  . oxyCODONE-acetaminophen (ROXICET) 5-325 MG tablet Take 1 tablet by mouth every 4 (four) hours as needed. 10  tablet 0  . pantoprazole (PROTONIX) 40 MG tablet Take 40 mg by mouth 2 (two) times daily as needed (for acid reflux).     . torsemide (DEMADEX) 20 MG tablet Take 1 tablet (20 mg total) by mouth 2 (two) times daily. 60 tablet 3  . Vitamin D, Ergocalciferol, (DRISDOL) 50000 units CAPS capsule Take 1 capsule (50,000 Units total) by mouth every 7 (seven) days. (Patient taking differently: Take 50,000 Units by mouth every Monday. ) 12 capsule 3   Objective: BP 111/72   Pulse 80   Temp 97.8 F (36.6 C) (Oral)   Resp 15   SpO2 100%  Exam: General: elderly lady laying in bed in NAD Eyes: arcus senilis present, bilateral cataracts, EOMI. PERRL. ENTM: moist mucous membranes. Poor dentition. No erythema or exudate in oropharynx Neck: supple, non-tender, no LAD Cardiovascular: RRR, no MRG. JVD present.  Respiratory: crackles present in bilateral lung bases, no increased work of breathing Gastrointestinal: distended, non-tender. pitting edema noted on abdomen. +BS. No obvious organomegaly. No fluid wave. MSK: +2 pitting edema up to mid-thighs bilaterally Derm: no rashes or lesions noted Neuro: CN2-12 in tact, strength 5/5 in upper and lower extremities bilaterally, sensation in tact throughout  Psych: appropriate mood and affect  Labs and Imaging: CBC BMET   Recent Labs Lab 08/08/16 1220  WBC 6.9  HGB 12.2  HCT 37.7  PLT 288    Recent Labs Lab 08/08/16 1220   NA 134*  K 3.8  CL 93*  CO2 25  BUN 30*  CREATININE 3.79*  GLUCOSE 141*  CALCIUM 9.2     Dg Chest 2 View  Result Date: 08/08/2016 CLINICAL DATA:  Hypotension during the past to dialysis treatments limiting removal of fluid. The patient for shortness of breath and swelling today. History of CHF. EXAM: CHEST  2 VIEW COMPARISON:  Portable chest x-ray of Jul 20, 2016 FINDINGS: The lungs are well-expanded. The interstitial markings are mildly prominent as compared to the previous study. There are small bilateral pleural effusions greatest on the left. The cardiac silhouette remains enlarged. The pulmonary vascularity is more engorged today. There is a dual-lumen dialysis catheter in place with the tip over the distal third of the SVC. There is calcification in the wall of the aortic arch. The observed bony thorax is unremarkable. IMPRESSION: CHF manifested by cardiomegaly and small bilateral pleural effusions. Only mild interstitial edema is observed. Electronically Signed   By: David  Martinique M.D.   On: 08/08/2016 12:43     Steve Rattler, DO 08/08/2016, 3:50 PM PGY-1, [place] Intern pager: [phone], text pages welcome  Upper Level Addendum:  I have seen and evaluated this patient along with Dr. Vanetta Shawl and reviewed the above note, making necessary revisions in green.  Rogue Bussing, MD PGY-2,  [facility] 08/08/2016 4:02 PM

## 2016-08-08 NOTE — ED Notes (Signed)
Admitting at bedside 

## 2016-08-08 NOTE — ED Triage Notes (Signed)
Pt reports being dialysis pt. Last treatment was Monday. Pt reports unable to get fluid removed during last two dialysis treatments due to low bp. Pt is here today due to sob and swelling. Denies cp.

## 2016-08-08 NOTE — ED Notes (Signed)
Patient transported to X-ray 

## 2016-08-08 NOTE — ED Notes (Signed)
Attempted report x1. 

## 2016-08-09 ENCOUNTER — Inpatient Hospital Stay (HOSPITAL_COMMUNITY): Payer: Medicare Other

## 2016-08-09 LAB — HEPATIC FUNCTION PANEL
ALK PHOS: 134 U/L — AB (ref 38–126)
ALT: 15 U/L (ref 14–54)
AST: 30 U/L (ref 15–41)
Albumin: 2.9 g/dL — ABNORMAL LOW (ref 3.5–5.0)
BILIRUBIN DIRECT: 0.4 mg/dL (ref 0.1–0.5)
BILIRUBIN INDIRECT: 0.7 mg/dL (ref 0.3–0.9)
TOTAL PROTEIN: 6.7 g/dL (ref 6.5–8.1)
Total Bilirubin: 1.1 mg/dL (ref 0.3–1.2)

## 2016-08-09 LAB — RENAL FUNCTION PANEL
Albumin: 2.9 g/dL — ABNORMAL LOW (ref 3.5–5.0)
Anion gap: 13 (ref 5–15)
BUN: 17 mg/dL (ref 6–20)
CO2: 24 mmol/L (ref 22–32)
Calcium: 8.7 mg/dL — ABNORMAL LOW (ref 8.9–10.3)
Chloride: 96 mmol/L — ABNORMAL LOW (ref 101–111)
Creatinine, Ser: 2.59 mg/dL — ABNORMAL HIGH (ref 0.44–1.00)
GFR calc Af Amer: 18 mL/min — ABNORMAL LOW (ref >60)
GFR calc non Af Amer: 16 mL/min — ABNORMAL LOW (ref >60)
Glucose, Bld: 151 mg/dL — ABNORMAL HIGH (ref 65–99)
Phosphorus: 1.7 mg/dL — ABNORMAL LOW (ref 2.5–4.6)
Potassium: 3.1 mmol/L — ABNORMAL LOW (ref 3.5–5.1)
Sodium: 133 mmol/L — ABNORMAL LOW (ref 135–145)

## 2016-08-09 LAB — CBC
HCT: 30.9 % — ABNORMAL LOW (ref 36.0–46.0)
HEMOGLOBIN: 10.4 g/dL — AB (ref 12.0–15.0)
MCH: 26.9 pg (ref 26.0–34.0)
MCHC: 33.7 g/dL (ref 30.0–36.0)
MCV: 80.1 fL (ref 78.0–100.0)
PLATELETS: 176 10*3/uL (ref 150–400)
RBC: 3.86 MIL/uL — AB (ref 3.87–5.11)
RDW: 18.7 % — ABNORMAL HIGH (ref 11.5–15.5)
WBC: 6.3 10*3/uL (ref 4.0–10.5)

## 2016-08-09 LAB — COMPREHENSIVE METABOLIC PANEL
ALBUMIN: 2.9 g/dL — AB (ref 3.5–5.0)
ALK PHOS: 127 U/L — AB (ref 38–126)
ALT: 15 U/L (ref 14–54)
AST: 30 U/L (ref 15–41)
Anion gap: 13 (ref 5–15)
BUN: 15 mg/dL (ref 6–20)
CALCIUM: 8.7 mg/dL — AB (ref 8.9–10.3)
CHLORIDE: 96 mmol/L — AB (ref 101–111)
CO2: 25 mmol/L (ref 22–32)
CREATININE: 2.61 mg/dL — AB (ref 0.44–1.00)
GFR calc Af Amer: 18 mL/min — ABNORMAL LOW (ref >60)
GFR calc non Af Amer: 16 mL/min — ABNORMAL LOW (ref >60)
GLUCOSE: 152 mg/dL — AB (ref 65–99)
Potassium: 3.1 mmol/L — ABNORMAL LOW (ref 3.5–5.1)
Sodium: 134 mmol/L — ABNORMAL LOW (ref 135–145)
Total Bilirubin: 1 mg/dL (ref 0.3–1.2)
Total Protein: 6.8 g/dL (ref 6.5–8.1)

## 2016-08-09 LAB — T4, FREE: FREE T4: 1.05 ng/dL (ref 0.61–1.12)

## 2016-08-09 LAB — BRAIN NATRIURETIC PEPTIDE: B Natriuretic Peptide: >4500 pg/mL — ABNORMAL HIGH (ref 0.0–100.0)

## 2016-08-09 LAB — GLUCOSE, CAPILLARY: Glucose-Capillary: 115 mg/dL — ABNORMAL HIGH (ref 65–99)

## 2016-08-09 LAB — TSH: TSH: 8.46 u[IU]/mL — AB (ref 0.350–4.500)

## 2016-08-09 MED ORDER — MIDODRINE HCL 5 MG PO TABS
ORAL_TABLET | ORAL | Status: AC
  Filled 2016-08-09: qty 2

## 2016-08-09 MED ORDER — LIDOCAINE HCL (PF) 1 % IJ SOLN: 5.0000 mL | INTRAMUSCULAR | Status: DC | PRN

## 2016-08-09 MED ORDER — ALTEPLASE 2 MG IJ SOLR: 2.0000 mg | Freq: Once | INTRAMUSCULAR | Status: DC | PRN

## 2016-08-09 MED ORDER — PENTAFLUOROPROP-TETRAFLUOROETH EX AERO: 1.0000 "application " | INHALATION_SPRAY | CUTANEOUS | Status: DC | PRN

## 2016-08-09 MED ORDER — SODIUM CHLORIDE 0.9 % IV SOLN: 100.0000 mL | INTRAVENOUS | Status: DC | PRN

## 2016-08-09 MED ORDER — HEPARIN SODIUM (PORCINE) 1000 UNIT/ML DIALYSIS: 1000.0000 [IU] | INTRAMUSCULAR | Status: DC | PRN

## 2016-08-09 MED ORDER — LIDOCAINE-PRILOCAINE 2.5-2.5 % EX CREA: 1.0000 "application " | TOPICAL_CREAM | CUTANEOUS | Status: DC | PRN

## 2016-08-09 MED ORDER — MIDODRINE HCL 5 MG PO TABS
ORAL_TABLET | ORAL | Status: AC
  Filled 2016-08-09: qty 1

## 2016-08-09 NOTE — Progress Notes (Signed)
Family Medicine Teaching Service Daily Progress Note Intern Pager: [phone]  Patient name: Wendy Kerr Medical record number: [id number] Date of birth: Dec 19, 1930 Age: 83 y.o. Gender: female  Primary Care Provider: System, Pcp Not In Consultants: Nephrology Code Status: Full  Pt Overview and Major Events to Date:  HD on 6/13, 6/14   Assessment and Plan: Wendy Kerr is a 83 y.o. female wit a past medical history significant for presenting with dyspnea in the setting of volume overload 2/2  Inadequate dialysis session due to hypotension.  #Dyspnea, acute, resolving Secondary to Volume overload, HD session yesterday with immediate improvement in respiratory status. Lungs on exam with no crackles and good air movement. Patient still not at baseline but is improving. Abdominal fluid has also improved but need to further characterize etiology. Patient likely need additional fluid removal. --Follow up on Nephrology consult appreciate recs. --Monitor respiratory status  --Will get abdominal US to assess for ascites  #ESRD, recently started on Hemodialysis Problems with hypotension led to inappropriate volume removal from last two sessions. Midodrine prior and during session have help maintain BP around normal range. Patient schedule for another session today. --Will follow up Nephrology consult,  appreciate recommendations  #Elevated troponin On admission 0.85 in ED. No EKG changes noted. No chest pain. Likely demand ischemia in the setting of volume overload --monitor on telemetry -f/u on repeat EKG --Cardiology consult with worsening cardiac symptoms   #Acute on chronic CHF  last echo September 2017 showed EF 40-45% with mild to moderate reduced systolic function, no wall motion abnormalities. Severe concentric hypertrophy present, right systolic pressure consistent with moderate pulmonary hypertension. BNP elevated at >4500 to be expected in ESRD with volume overload. --Follow  up on ECHO   #Pruritis  Likely secondary to worsening kidney function and failing liver. Expect improvement in symptoms with dialysis. --Continue remeron and hydroxyzine as needed  #HTN  has been hypotensive over past 1-2 weeks preventing her from getting dialysis. On Hydralazine 12.5 mg BID at home -hold home BP meds -monitor BP -midodrine as above  #HLD --Continue Atorvastatin 20mg  qd  #T2DM Last A1C June 2016 was 6.6 --Follow up on A1C --Fasting CBG every morning --Consider medications if needed  #AFib/flutter History of arrhythmias and followed by cardiology [facility] --Continue Eliquis 2.5 mg bid, --Monitor on telemetry   #GERD  --Continue protonix 40 mg bid  #Pancreatic cystadenoma --[facility] as [facility]  #Elevated TSH Was 10.05 on 06/26/16. Repeat TSH still elevated at 8.460. T4 is within normal limit at 1.05. Could be subclinical hypothryroidism given age and multiple comorbidities would just adjust dosage without seeking aggressive treatment. --Titrate Levothyroxine dose from 50 to 100 mcg  FEN/GI: renal diet, carb modified PPx: Currently on Eliquis  Disposition: Likely SNF in the next 48 hrs will follow up on PT/OT recs  Subjective:  Feeling better this morning, breathing has improved but still not at baseline. Has not been able to ambulate.  Objective: Temp:  [97.4 F (36.3 C)-98 F (36.7 C)] 98 F (36.7 C) (06/14 1223) Pulse Rate:  [48-95] 79 (06/14 1300) Resp:  [15-20] 16 (06/14 1223) BP: (82-180)/(21-87) 89/57 (06/14 1300) SpO2:  [98 %-100 %] 98 % (06/14 1223) Weight:  [185 lb 3 oz (84 kg)-194 lb 7.1 oz (88.2 kg)] 185 lb 3 oz (84 kg) (06/14 1223) Physical Exam:  General: elderly lady laying in bed in NAD Eyes: arcus senilis present, bilateral cataracts, EOMI. PERRL. ENTM: moist mucous membranes. Poor dentition. No erythema or  exudate in oropharynx Neck: supple, non-tender, no LAD Cardiovascular: RRR, no MRG. JVD present.   Respiratory: No crackles present on exam, no increased work of breathing Gastrointestinal: distended, non-tender. pitting edema noted on abdomen. +BS. No obvious organomegaly. No fluid wave. MSK:  pitting edema  has improved Derm: no rashes or lesions noted Neuro: CN2-12 in tact, strength 5/5 in upper and lower extremities bilaterally, sensation in tact throughout  Psych: appropriate mood and affect  Laboratory:  Recent Labs Lab 08/08/16 1220 08/09/16 0021  WBC 6.9 6.3  HGB 12.2 10.4*  HCT 37.7 30.9*  PLT 288 176    Recent Labs Lab 08/08/16 1220 08/09/16 0021  NA 134* 134*  133*  K 3.8 3.1*  3.1*  CL 93* 96*  96*  CO2 25 25  24   BUN 30* 15  17  CREATININE 3.79* 2.61*  2.59*  CALCIUM 9.2 8.7*  8.7*  PROT  --  6.7  6.8  BILITOT  --  1.1  1.0  ALKPHOS  --  134*  127*  ALT  --  15  15  AST  --  30  30  GLUCOSE 141* 152*  151*     Imaging/Diagnostic Tests: Dg Chest 2 View  Result Date: 08/08/2016 CLINICAL DATA:  Hypotension during the past to dialysis treatments limiting removal of fluid. The patient for shortness of breath and swelling today. History of CHF. EXAM: CHEST  2 VIEW COMPARISON:  Portable chest x-ray of Jul 20, 2016 FINDINGS: The lungs are well-expanded. The interstitial markings are mildly prominent as compared to the previous study. There are small bilateral pleural effusions greatest on the left. The cardiac silhouette remains enlarged. The pulmonary vascularity is more engorged today. There is a dual-lumen dialysis catheter in place with the tip over the distal third of the SVC. There is calcification in the wall of the aortic arch. The observed bony thorax is unremarkable. IMPRESSION: CHF manifested by cardiomegaly and small bilateral pleural effusions. Only mild interstitial edema is observed. Electronically Signed   By: David  Martinique M.D.   On: 08/08/2016 12:43   US Abdomen Limited  Result Date: 08/09/2016 CLINICAL DATA:  Abdominal  distension, evaluate for ascites. EXAM: LIMITED ABDOMEN ULTRASOUND FOR ASCITES TECHNIQUE: Limited ultrasound survey for ascites was performed in all four abdominal quadrants. COMPARISON:  Abdominal ultrasound of Jul 05, 2016 FINDINGS: There is a moderate amount of ascites observed in all 4 quadrants. The greatest volume appears to lie in the left lower quadrant. IMPRESSION: There is a moderate amount of ascites noted throughout the abdomen. Electronically Signed   By: David  Martinique M.D.   On: 08/09/2016 11:20  w  Marjie Skiff, MD 08/09/2016, 1:43 PM PGY-1, [place] Intern pager: [phone], text pages welcome

## 2016-08-09 NOTE — Evaluation (Signed)
Physical Therapy Evaluation Patient Details Name: Wendy Kerr MRN: [id number] DOB: [birth year] Today's Date: 08/09/2016   History of Present Illness  83 Y/O F with PMX of Vit D deficiency, OSA, Monoclonal Gammopathy.HTN, HLD, GERD, DJD, DM2, CKD, CHF, Aflutter, presented with hx of SOB worsening over the past week mostly on exertion.  Clinical Impression  Pt presents with the above diagnosis and below deficits for therapy evaluation. Prior to admission, pt lived alone in a single level home and her son was assisted with ADLs. Pt requires Min A for a majority of mobility this session and continues to have increased weakness and poor endurance with current hospital stay. Pt will benefit from SNF at discharge in order to improve her endurance and enable her to return home alone. Pt continues to benefit from acute follow-up in order to address the below deficits prior to discharge.     Follow Up Recommendations SNF    Equipment Recommendations  None recommended by PT    Recommendations for Other Services       Precautions / Restrictions Precautions Precautions: Fall Restrictions Weight Bearing Restrictions: No      Mobility  Bed Mobility Overal bed mobility: Needs Assistance Bed Mobility: Sit to Supine     Supine to sit: Min assist;HOB elevated Sit to supine: Min assist   General bed mobility comments: Min A to bring LEs into bed. Able to self adjust  Transfers Overall transfer level: Needs assistance Equipment used: Rolling walker (2 wheeled) Transfers: Sit to/from Stand Sit to Stand: Min assist Stand pivot transfers: Min assist       General transfer comment: Min A to boost into standing from lower recliner. good hand placement  Ambulation/Gait Ambulation/Gait assistance: Min assist Ambulation Distance (Feet): 15 Feet Assistive device: Rolling walker (2 wheeled) Gait Pattern/deviations: Step-through pattern;Decreased step length - right;Decreased step length -  left Gait velocity: decreased Gait velocity interpretation: Below normal speed for age/gender General Gait Details: slow cadence, decreased step length bilaterally. Pt quickly fatigues and has to return to bed.   Stairs            Wheelchair Mobility    Modified Rankin (Stroke Patients Only)       Balance Overall balance assessment: Needs assistance Sitting-balance support: Feet supported;Bilateral upper extremity supported Sitting balance-Leahy Scale: Fair     Standing balance support: Bilateral upper extremity supported Standing balance-Leahy Scale: Poor Standing balance comment: RW for support                             Pertinent Vitals/Pain Pain Assessment: Faces Faces Pain Scale: Hurts little more Pain Location: generalized Pain Descriptors / Indicators: Sore Pain Intervention(s): Monitored during session    Home Living Family/patient expects to be discharged to:: Private residence Living Arrangements: Alone Available Help at Discharge: Family;Available PRN/intermittently Type of Home: House Home Access: Stairs to enter   Entrance Stairs-Number of Steps: 4 Home Layout: One level Home Equipment: Grab bars - tub/shower;Shower seat;Walker - 2 wheels      Prior Function Level of Independence: Needs assistance   Gait / Transfers Assistance Needed: RW for mobility  ADL's / Homemaking Assistance Needed: son assisting with ADL        Hand Dominance   Dominant Hand: Right    Extremity/Trunk Assessment   Upper Extremity Assessment Upper Extremity Assessment: Defer to OT evaluation    Lower Extremity Assessment Lower Extremity Assessment: Generalized weakness  Communication   Communication: No difficulties  Cognition Arousal/Alertness: Awake/alert Behavior During Therapy: WFL for tasks assessed/performed Overall Cognitive Status: Within Functional Limits for tasks assessed                                         General Comments      Exercises     Assessment/Plan    PT Assessment Patient needs continued PT services  PT Problem List Decreased strength;Decreased activity tolerance;Decreased balance;Decreased mobility       PT Treatment Interventions DME instruction;Gait training;Functional mobility training;Therapeutic activities;Therapeutic exercise;Balance training;Stair training    PT Goals (Current goals can be found in the Care Plan section)  Acute Rehab PT Goals Patient Stated Goal: to get back to normal PT Goal Formulation: With patient Time For Goal Achievement: 08/23/16 Potential to Achieve Goals: Good    Frequency Min 2X/week   Barriers to discharge        Co-evaluation               AM-PAC PT "6 Clicks" Daily Activity  Outcome Measure Difficulty turning over in bed (including adjusting bedclothes, sheets and blankets)?: None Difficulty moving from lying on back to sitting on the side of the bed? : Total Difficulty sitting down on and standing up from a chair with arms (e.g., wheelchair, bedside commode, etc,.)?: Total Help needed moving to and from a bed to chair (including a wheelchair)?: A Little Help needed walking in hospital room?: A Little Help needed climbing 3-5 steps with a railing? : A Lot 6 Click Score: 14    End of Session Equipment Utilized During Treatment: Gait belt Activity Tolerance: Patient limited by fatigue Patient left: in bed;with call bell/phone within reach;with nursing/sitter in room Nurse Communication: Mobility status PT Visit Diagnosis: Unsteadiness on feet (R26.81);Muscle weakness (generalized) (M62.81);Difficulty in walking, not elsewhere classified (R26.2)    Time: 5916-3846 PT Time Calculation (min) (ACUTE ONLY): 11 min   Charges:   PT Evaluation $PT Eval Moderate Complexity: 1 Procedure     PT G Codes:        Scheryl Marten PT, DPT  [phone]   Shanon Rosser 08/09/2016, 1:52 PM

## 2016-08-09 NOTE — Evaluation (Signed)
Occupational Therapy Evaluation Patient Details Name: Wendy Kerr MRN: [id number] DOB: 04-Aug-1930 Today's Date: 08/09/2016    History of Present Illness 83 Y/O F with PMX of Vit D deficiency, OSA, Monoclonal Gammopathy.HTN, HLD, GERD, DJD, DM2, CKD, CHF, Aflutter, presented with hx of SOB worsening over the past week mostly on exertion.   Clinical Impression   Pt reports she has required increased assist from family with ADL recently PTA. Currently pt requires min assist for ADL and basic transfers with the exception of max assist for LB ADL. Pt with DOE 2/4 with minimal activity; unable to obtain SpO2 reading. Pt presenting with generalized weakness, deconditioning, SOB with activity, and decreased activity tolerance impacting her independence and safety with ADL and functional mobility. Recommending short term SNF for follow up to maximize independence and safety with ADL and functional mobility prior to return home alone. Pt would benefit from continued skilled OT to address established goals.    Follow Up Recommendations  SNF;Supervision/Assistance - 24 hour    Equipment Recommendations  3 in 1 bedside commode;Tub/shower bench    Recommendations for Other Services PT consult     Precautions / Restrictions Precautions Precautions: Fall Restrictions Weight Bearing Restrictions: No      Mobility Bed Mobility Overal bed mobility: Needs Assistance Bed Mobility: Supine to Sit     Supine to sit: Min assist;HOB elevated     General bed mobility comments: Min assist for trunk elevation to upright sitting position. Increased SOB with bed mobility. HOB slightly elevated with use of bed rails  Transfers Overall transfer level: Needs assistance Equipment used: Rolling walker (2 wheeled) Transfers: Sit to/from [facility] Sit to Stand: Min assist Stand pivot transfers: Min assist       General transfer comment: Min assist to boost up into standing. Cues for  hand placement with RW. Min assist for stand pivot due to balance deficits.    Balance Overall balance assessment: Needs assistance Sitting-balance support: Feet supported;Bilateral upper extremity supported Sitting balance-Leahy Scale: Fair     Standing balance support: Bilateral upper extremity supported Standing balance-Leahy Scale: Poor Standing balance comment: RW for support                           ADL either performed or assessed with clinical judgement   ADL Overall ADL's : Needs assistance/impaired Eating/Feeding: Set up;Sitting   Grooming: Set up;Supervision/safety;Sitting;Brushing hair   Upper Body Bathing: Minimal assistance;Sitting   Lower Body Bathing: Maximal assistance;Sit to/from stand   Upper Body Dressing : Minimal assistance;Sitting   Lower Body Dressing: Maximal assistance;Sit to/from stand Lower Body Dressing Details (indicate cue type and reason): Assist to adjust socks Toilet Transfer: Minimal assistance;Stand-pivot;BSC;RW Toilet Transfer Details (indicate cue type and reason): Simulated by stand pivot EOB>chair         Functional mobility during ADLs: Minimal assistance;Rolling walker (for stand pivot only) General ADL Comments: DOE 2/4 with minimal activity; unable to obtain SpO2 reading.     Vision         Perception     Praxis      Pertinent Vitals/Pain Pain Assessment: Faces Faces Pain Scale: Hurts little more Pain Location: generalized Pain Descriptors / Indicators: Sore ("itching") Pain Intervention(s): Limited activity within patient's tolerance;Monitored during session;Repositioned     Hand Dominance     Extremity/Trunk Assessment Upper Extremity Assessment Upper Extremity Assessment: Generalized weakness   Lower Extremity Assessment Lower Extremity Assessment: Defer to PT evaluation  Communication Communication Communication: No difficulties   Cognition Arousal/Alertness: Awake/alert Behavior  During Therapy: WFL for tasks assessed/performed Overall Cognitive Status: Within Functional Limits for tasks assessed                                     General Comments       Exercises     Shoulder Instructions      Home Living Family/patient expects to be discharged to:: Private residence Living Arrangements: Alone Available Help at Discharge: Family;Available PRN/intermittently Type of Home: House Home Access: Stairs to enter [employer] of Steps: 4   Home Layout: One level     Bathroom Shower/Tub: Teacher, early years/pre: Standard     Home Equipment: Grab bars - tub/shower;Shower seat;Walker - 2 wheels          Prior Functioning/Environment Level of Independence: Needs assistance  Gait / Transfers Assistance Needed: RW for mobility ADL's / Homemaking Assistance Needed: son assisting with ADL            OT Problem List: Decreased strength;Decreased activity tolerance;Impaired balance (sitting and/or standing);Decreased knowledge of use of DME or AE;Decreased knowledge of precautions;Obesity;Impaired UE functional use;Pain;Increased edema      OT Treatment/Interventions: Self-care/ADL training;Energy conservation;DME and/or AE instruction;Therapeutic exercise;Therapeutic activities;Patient/family education;Balance training    OT Goals(Current goals can be found in the care plan section) Acute Rehab OT Goals Patient Stated Goal: stop itching OT Goal Formulation: With patient Time For Goal Achievement: 08/23/16 Potential to Achieve Goals: Good ADL Goals Pt Will Perform Grooming: with supervision;sitting;standing (x3 tasks) Pt Will Perform Upper Body Bathing: with supervision;sitting Pt Will Perform Lower Body Bathing: with supervision;sit to/from stand (with or without AE) Pt Will Transfer to Toilet: with supervision;ambulating;bedside commode (over toilet) Pt Will Perform Toileting - Clothing Manipulation and hygiene:  with supervision;sit to/from stand Additional ADL Goal #1: Pt will independently verbally recall 3 energy conservation strategies and use during ADL.  OT Frequency: Min 2X/week   Barriers to D/C: Decreased caregiver support  intermittent assist only       Co-evaluation              AM-PAC PT "6 Clicks" Daily Activity     Outcome Measure Help from another person eating meals?: None Help from another person taking care of personal grooming?: A Little Help from another person toileting, which includes using toliet, bedpan, or urinal?: A Little Help from another person bathing (including washing, rinsing, drying)?: A Lot Help from another person to put on and taking off regular upper body clothing?: A Little Help from another person to put on and taking off regular lower body clothing?: A Lot 6 Click Score: 17   End of Session Equipment Utilized During Treatment: Rolling walker Nurse Communication: Mobility status  Activity Tolerance: Patient limited by fatigue Patient left: in chair;Other (comment) (with PT)  OT Visit Diagnosis: Unsteadiness on feet (R26.81);Other abnormalities of gait and mobility (R26.89);Muscle weakness (generalized) (M62.81)                Time: 6389-3734 OT Time Calculation (min): 13 min Charges:  OT General Charges $OT Visit: 1 Procedure OT Evaluation $OT Eval Moderate Complexity: 1 Procedure G-Codes:     Davell Beckstead A. Ulice Brilliant, M.S., OTR/L Pager: [place] 08/09/2016, 10:45 AM

## 2016-08-09 NOTE — Progress Notes (Signed)
[facility] Progress Note   Subjective: "I'm doing OK. I just stood up for the doctors and I got a little short of breath but I am better now!" In low fowlers position, NAD. Pleasant, no new complaints.   Objective Vitals:   08/08/16 2257 08/08/16 2332 08/09/16 0615 08/09/16 0952  BP: 107/66 (!) 98/56 (!) 91/57 (!) 90/51  Pulse: 81 80 79 82  Resp: 16 16 16 20   Temp: 97.6 F (36.4 C) 97.5 F (36.4 C) 97.7 F (36.5 C) 97.8 F (36.6 C)  TempSrc: Oral Oral Oral Oral  SpO2: 100% 100% 99% 98%  Weight: 84 kg (185 lb 3 oz)      Physical Exam General: Obese elderly female, very pleasant, NAD Heart: S1,S2, + S4.  Lungs: Decreased in bases, otherwise CTAB Abdomen: obese, active BS Extremities: Continues to have anasarca from hips to ankles but slightly improved from yesterday Dialysis Access: [facility].   Dialysis Orders:  Additional Objective Labs: Basic Metabolic Panel:  Recent Labs Lab 08/08/16 1220 08/09/16 0021  NA 134* 134*  133*  K 3.8 3.1*  3.1*  CL 93* 96*  96*  CO2 25 25  24   GLUCOSE 141* 152*  151*  BUN 30* 15  17  CREATININE 3.79* 2.61*  2.59*  CALCIUM 9.2 8.7*  8.7*  PHOS  --  1.7*   Liver Function Tests:  Recent Labs Lab 08/09/16 0021  AST 30  30  ALT 15  15  ALKPHOS 134*  127*  BILITOT 1.1  1.0  PROT 6.7  6.8  ALBUMIN 2.9*  2.9*  2.9*   No results for input(s): LIPASE, AMYLASE in the last 168 hours. CBC:  Recent Labs Lab 08/08/16 1220 08/09/16 0021  WBC 6.9 6.3  HGB 12.2 10.4*  HCT 37.7 30.9*  MCV 83.0 80.1  PLT 288 176   Blood Culture    Component Value Date/Time   SDES FLUID PERITONEAL 11/15/2015 1207   SDES FLUID PERITONEAL 11/15/2015 1207   [place] 11/15/2015 1207   SPECREQUEST NONE 11/15/2015 1207   CULT  11/15/2015 1207    NO GROWTH 5 DAYS Performed at [facility] 11/20/2015 FINAL 11/15/2015 1207   REPTSTATUS 11/15/2015 FINAL 11/15/2015 1207     Cardiac Enzymes: No results for input(s): CKTOTAL, CKMB, CKMBINDEX, TROPONINI in the last 168 hours. CBG:  Recent Labs Lab 08/08/16 1655 08/08/16 2327 08/09/16 0738  GLUCAP 100* 109* 115*   Iron Studies: No results for input(s): IRON, TIBC, TRANSFERRIN, FERRITIN in the last 72 hours. @lablastinr3 @ Studies/Results: Dg Chest 2 View  Result Date: 08/08/2016 CLINICAL DATA:  Hypotension during the past to dialysis treatments limiting removal of fluid. The patient for shortness of breath and swelling today. History of CHF. EXAM: CHEST  2 VIEW COMPARISON:  Portable chest x-ray of Jul 20, 2016 FINDINGS: The lungs are well-expanded. The interstitial markings are mildly prominent as compared to the previous study. There are small bilateral pleural effusions greatest on the left. The cardiac silhouette remains enlarged. The pulmonary vascularity is more engorged today. There is a dual-lumen dialysis catheter in place with the tip over the distal third of the SVC. There is calcification in the wall of the aortic arch. The observed bony thorax is unremarkable. IMPRESSION: CHF manifested by cardiomegaly and small bilateral pleural effusions. Only mild interstitial edema is observed. Electronically Signed   By: David  Martinique M.D.   On: 08/08/2016 12:43   Medications: . sodium chloride     .  apixaban  2.5 mg Oral BID  . isosorbide mononitrate  30 mg Oral Daily  . levothyroxine  50 mcg Oral QAC breakfast  . midodrine  10 mg Oral TID WC  . [START ON 08/10/2016] midodrine  5 mg Oral Q M,W,F-HD  . sodium chloride flush  3 mL Intravenous Q12H  . sodium chloride flush  3 mL Intravenous Q12H     Dialysis Orders: [facility] MWF 4 hrs 200/500 EDW 79.5 kg 2.0 K/2.0 Ca Linear Na -No heparin -Hectorol 3 mcg IV TIW -Venofer 50 mg IV TIW Last labs: HGB 10.3 08/01/16 Ferritin 548 Fe 103 Tsat 40 Ca 8.8 C Ca 9.2 Phos 7.8 PTH 752 (07/23/16).   BMD meds: -Calcium Acetate 667 mg 2 tabs PO TID  AC  Assessment/Plan: 1. Volume overload D/T inability to remove volume D/T hypotension: Will have serial hemodialysis for volume removal. Increase midodrine to 10 mg PO prior to HD and 5 mg PO mid treatment. Max volume removal as tolerated. Severe refractory edema one month into dialysis is not a good prognostic sign.  Have d/w patient that we will do all that we can to facilitate fluid removal in the hospital but overall if there is not enough cardiac function / blood pressure to get the fluid off that there would be nothing else we could do.   2. H/O Chronic CHF. BNP not useful in ESRD. Does not appear volume overloaded on CXR. Monitor.  3.  ESRD -  MWF. K+ 3.1 Use 4.0 K bath. No heparin. HD today and again tomorrow.  4. Elevated Troponin: probably demand ischemia. Per primary.  5.  Hypotension/volume: as noted above. HD 08/09/16 Pre wt 88.2 Net UF 4 liters Post wt 84 kg. HD again today. Continue max volume removal today.  6.  Anemia  - HGB stable. No ESA needed. Continue weekly Fe. 7.  Metabolic bone disease - Phos 1.7 hold binders Ca 8.7 C Ca 9.58. Cont.VDRA 8.  Nutrition - Albumin 2.9 Renal diet/carb mod diet, renal vit/nepro 9.  DM: Diet controlled, no meds.  10. H/O Afib/Alfutter: Continue Apixaban.   Rita H. Brown NP-C 08/09/2016, 10:05 AM  [facility] [phone]  Pt seen, examined, agree w assess/plan as above with additions as indicated.  Kelly Splinter MD [employer] pager [phone]    cell [phone] 08/09/2016, 2:03 PM

## 2016-08-09 NOTE — Procedures (Signed)
  I was present at this dialysis session, have reviewed the session itself and made  appropriate changes Kelly Splinter MD [place] pager [phone]   08/09/2016, 2:03 PM

## 2016-08-10 ENCOUNTER — Encounter: Payer: Self-pay | Admitting: Vascular Surgery

## 2016-08-10 LAB — CBC
HEMATOCRIT: 32.3 % — AB (ref 36.0–46.0)
Hemoglobin: 10.4 g/dL — ABNORMAL LOW (ref 12.0–15.0)
MCH: 26.7 pg (ref 26.0–34.0)
MCHC: 32.2 g/dL (ref 30.0–36.0)
MCV: 83 fL (ref 78.0–100.0)
Platelets: 161 10*3/uL (ref 150–400)
RBC: 3.89 MIL/uL (ref 3.87–5.11)
RDW: 19.8 % — AB (ref 11.5–15.5)
WBC: 5 10*3/uL (ref 4.0–10.5)

## 2016-08-10 LAB — RENAL FUNCTION PANEL
Albumin: 2.8 g/dL — ABNORMAL LOW (ref 3.5–5.0)
Anion gap: 15 (ref 5–15)
BUN: 16 mg/dL (ref 6–20)
CHLORIDE: 94 mmol/L — AB (ref 101–111)
CO2: 25 mmol/L (ref 22–32)
CREATININE: 2.62 mg/dL — AB (ref 0.44–1.00)
Calcium: 8.6 mg/dL — ABNORMAL LOW (ref 8.9–10.3)
GFR calc Af Amer: 18 mL/min — ABNORMAL LOW (ref >60)
GFR, EST NON AFRICAN AMERICAN: 16 mL/min — AB (ref >60)
Glucose, Bld: 109 mg/dL — ABNORMAL HIGH (ref 65–99)
POTASSIUM: 4.1 mmol/L (ref 3.5–5.1)
Phosphorus: 2 mg/dL — ABNORMAL LOW (ref 2.5–4.6)
Sodium: 134 mmol/L — ABNORMAL LOW (ref 135–145)

## 2016-08-10 LAB — HEPATITIS PANEL, ACUTE
HCV Ab: <0.1 s/co ratio (ref 0.0–0.9)
HEP A IGM: NEGATIVE
HEP B S AG: NEGATIVE
Hep B C IgM: NEGATIVE

## 2016-08-10 LAB — GLUCOSE, CAPILLARY: Glucose-Capillary: 115 mg/dL — ABNORMAL HIGH (ref 65–99)

## 2016-08-10 LAB — HEMOGLOBIN A1C
Hgb A1c MFr Bld: 5.6 % (ref 4.8–5.6)
Mean Plasma Glucose: 114 mg/dL

## 2016-08-10 MED ORDER — ALTEPLASE 2 MG IJ SOLR: 2.0000 mg | Freq: Once | INTRAMUSCULAR | Status: DC | PRN

## 2016-08-10 MED ORDER — MIDODRINE HCL 5 MG PO TABS
ORAL_TABLET | ORAL | Status: AC
  Administered 2016-08-10: 5 mg via ORAL
  Filled 2016-08-10: qty 1

## 2016-08-10 MED ORDER — SODIUM CHLORIDE 0.9 % IV SOLN: 100.0000 mL | INTRAVENOUS | Status: DC | PRN

## 2016-08-10 MED ORDER — PROMETHAZINE HCL 25 MG/ML IJ SOLN
12.5000 mg | Freq: Three times a day (TID) | INTRAMUSCULAR | Status: DC | PRN
  Administered 2016-08-10: 12.5 mg via INTRAVENOUS
  Filled 2016-08-10: qty 1

## 2016-08-10 MED ORDER — LIDOCAINE-PRILOCAINE 2.5-2.5 % EX CREA: 1.0000 "application " | TOPICAL_CREAM | CUTANEOUS | Status: DC | PRN

## 2016-08-10 MED ORDER — LEVOTHYROXINE SODIUM 100 MCG PO TABS
100.0000 ug | ORAL_TABLET | Freq: Every day | ORAL | Status: DC
  Administered 2016-08-11 – 2016-08-14 (×4): 100 ug via ORAL
  Filled 2016-08-10 (×4): qty 1

## 2016-08-10 MED ORDER — HEPARIN SODIUM (PORCINE) 1000 UNIT/ML DIALYSIS: 1000.0000 [IU] | INTRAMUSCULAR | Status: DC | PRN

## 2016-08-10 MED ORDER — NEPRO/CARBSTEADY PO LIQD
237.0000 mL | Freq: Two times a day (BID) | ORAL | Status: DC
  Administered 2016-08-10 – 2016-08-14 (×7): 237 mL via ORAL
  Filled 2016-08-10 (×2): qty 237

## 2016-08-10 MED ORDER — RENA-VITE PO TABS
1.0000 | ORAL_TABLET | Freq: Every day | ORAL | Status: DC
  Administered 2016-08-10 – 2016-08-13 (×4): 1 via ORAL
  Filled 2016-08-10 (×4): qty 1

## 2016-08-10 MED ORDER — PENTAFLUOROPROP-TETRAFLUOROETH EX AERO: 1.0000 "application " | INHALATION_SPRAY | CUTANEOUS | Status: DC | PRN

## 2016-08-10 MED ORDER — LIDOCAINE HCL (PF) 1 % IJ SOLN: 5.0000 mL | INTRAMUSCULAR | Status: DC | PRN

## 2016-08-10 NOTE — Clinical Social Work Note (Signed)
CSW visited room and talked with patient and son Wendy Kerr [phone]). Per Wendy Kerr, patient lives alone and prior to admission was getting PT twice a week on Tu/Th (non dialysis days). Wendy Kerr was sitting up in bed eating lunch and listening, and son was primary participant in conversation. Recommended discharge disposition of rehab was brought up and son indicated that his mother will go home at discharge. Per son, patient will have 24/7 help at home; paid and through family assistance, and patient will continue with physical therapy at home. Wendy Kerr added that he would also like to have OT at home. Wendy Kerr was informed that once patient ready for discharge, the nurse case manager will talk with him about [facility] services.   Nurse case manager informed of [place] needs for patient at discharge. CSW signing off as current d/c plan is home, however please reconsult if any other SW services needed prior to discharge.  Wendy Kerr, MSW, LCSW Licensed Clinical Social Worker [place] [phone]

## 2016-08-10 NOTE — Discharge Instructions (Signed)

## 2016-08-10 NOTE — NC FL2 (Signed)
[place] LEVEL OF CARE SCREENING TOOL     IDENTIFICATION  Patient Name: Wendy Kerr Birthdate: 1930/04/30 Sex: female Admission Date (Current Location): 08/08/2016  [facility] and [place] Number:  Herbalist and Address:  [facility], [place] [address] [place], [place] [address]      Provider Number: [id number]  Attending Physician Name and Address:  Lupita Dawn, MD  Relative Name and Phone Number:  [place], [place], [phone] (home), [phone] (mobile)     Current Level of Care: Hospital Recommended Level of Care: [place] Prior Approval Number:    Date Approved/Denied:   PASRR Number: [id number]  (Eff. 08/10/16)  Discharge Plan: SNF    Current Diagnoses: Patient Active Problem List   Diagnosis Date Noted  . Fluid overload 08/08/2016  . Abdominal distension   . Anasarca   . End stage renal disease on dialysis ([place])   . Cough 06/16/2016  . Abnormal TSH 03/16/2016  . Itching 01/24/2016  . Poisoning by digoxin 12/22/2015  . Preoperative cardiovascular examination 12/22/2015  . SBO (small bowel obstruction) ([place])   . Nausea 12/18/2015  . Acute on chronic systolic CHF (congestive heart failure) ([place]) 11/18/2015  . Routine general medical examination at a health care facility 02/11/2015  . Vitamin D deficiency 11/30/2014  . Anemia, iron deficiency 11/30/2014  . Atrial flutter ([place]) 06/28/2014  . Right bundle branch block 06/22/2014  . CKD stage 3 due to type 2 diabetes mellitus ([place]) 06/21/2014  . OSA (obstructive sleep apnea)   . Pancreatic cystadenoma 01/26/2014  . GERD (gastroesophageal reflux disease) 01/01/2014  . Chronic combined systolic and diastolic heart failure ([place]) 01/21/2013    Class: Acute  . BEN NEOPLASM PANCREAS EXCEPT ISLETS [place]ANG[place]ANS 08/16/2007  . Diabetes mellitus type 2 in obese ([place]) 02/10/2007  . HYPERCHOLESTEROLEMIA 02/10/2007  . MONOCLONAL GAMMOPATHY 02/10/2007   . Obesity 02/10/2007  . Essential hypertension 02/10/2007  . ANXIETY DISORDER, GENERALIZED 12/07/2006  . DEGENERATIVE JOINT DISEASE 12/07/2006  . Serous cystadenoma 08/31/2001    Orientation RESPIRATION BLADDER Height & Weight     Self, Time, Situation, Place  Normal Continent Weight: 177 lb 4 oz (80.4 kg) Height:     BEHAVIORAL SYMPTOMS/MOOD NEUROLOGICAL BOWEL NUTRITION STATUS      Continent Diet (renal/carb modified with fluid restriction)  AMBULATORY STATUS COMMUNICATION OF NEEDS Skin   Limited Assist Verbally Normal                       Personal Care Assistance Level of Assistance  Bathing, Feeding, Dressing Bathing Assistance: Maximum assistance (Upper body min assist and lower body max assist) Feeding assistance: Limited assistance (Assistance with set-up) Dressing Assistance: Maximum assistance (Upper body min assist and lower body max assist)     Functional Limitations Info  Sight, Hearing, Speech Sight Info: Adequate Hearing Info: Adequate Speech Info: Adequate    SPECIAL CARE FACTORS FREQUENCY  PT (By licensed PT), OT (By licensed OT)     PT Frequency: Evaluated 6/14 and a minimum of 2X per week therapy recommended during hosp. stay OT Frequency: Evaluated 6/14 and a minimum of 3X per week therapy recommended during hosp. stay            Contractures Contractures Info: Not present    Additional Factors Info  Code Status, Allergies Code Status Info: Full Allergies Info: Piroxicam           Current Medications (08/10/2016):  This is  the current hospital active medication list Current Facility-Administered Medications  Medication Dose Route Frequency Provider Last Rate Last Dose  . 0.9 %  sodium chloride infusion  250 mL Intravenous PRN Lucila Maine C, DO      . acetaminophen (TYLENOL) tablet 650 mg  650 mg Oral Q6H PRN Lucila Maine C, DO       Or  . acetaminophen (TYLENOL) suppository 650 mg  650 mg Rectal Q6H PRN Steve Rattler, DO       . apixaban (ELIQUIS) tablet 2.5 mg  2.5 mg Oral BID Riccio, Angela C, DO   2.5 mg at 08/10/16 0744  . feeding supplement (NEPRO CARB STEADY) liquid 237 mL  237 mL Oral BID BM Alric Seton, PA-C      . hydrOXYzine (ATARAX/VISTARIL) tablet 25 mg  25 mg Oral TID PRN Lucila Maine C, DO   25 mg at 08/10/16 0939  . [START ON 08/11/2016] levothyroxine (SYNTHROID, LEVOTHROID) tablet 100 mcg  100 mcg Oral QAC breakfast Orson Eva J, DO      . midodrine (PROAMATINE) tablet 10 mg  10 mg Oral TID WC Riccio, Angela C, DO   10 mg at 08/10/16 0744  . midodrine (PROAMATINE) tablet 5 mg  5 mg Oral Q M,W,F-HD Valentina Gu, NP   5 mg at 08/10/16 0949  . mirtazapine (REMERON) tablet 15 mg  15 mg Oral QHS PRN Lucila Maine C, DO      . multivitamin (RENA-VIT) tablet 1 tablet  1 tablet Oral QHS Alric Seton, PA-C      . nitroGLYCERIN (NITROSTAT) SL tablet 0.4 mg  0.4 mg Sublingual Q5 min PRN Riccio, Angela C, DO      . pantoprazole (PROTONIX) EC tablet 40 mg  40 mg Oral BID PRN Riccio, Angela C, DO   40 mg at 08/09/16 0900  . promethazine (PHENERGAN) injection 12.5 mg  12.5 mg Intravenous Q8H PRN Riccio, Angela C, DO      . sodium chloride flush (NS) 0.9 % injection 3 mL  3 mL Intravenous Q12H Riccio, Angela C, DO      . sodium chloride flush (NS) 0.9 % injection 3 mL  3 mL Intravenous Q12H Riccio, Angela C, DO   3 mL at 08/10/16 0746  . sodium chloride flush (NS) 0.9 % injection 3 mL  3 mL Intravenous PRN Steve Rattler, DO         Discharge Medications: Please see discharge summary for a list of discharge medications.  Relevant Imaging Results:  Relevant Lab Results:   Additional Information [phone]. Dialysis patient MWF at [facility]. Per nephrology progress note on 6/15, patient will need 4 day a week HD at discharge and establish a max UF goal of 3 - 3.5 as outpatient due to inability to tolerate high UF goal..  Sharlet Salina, Mila Homer, LCSW

## 2016-08-10 NOTE — Progress Notes (Signed)
Called MD regarding BP of 86/63. PT is asymptomatic. MD stated to give Midodrine in HD and to continue to monitor.   Eleanora Neighbor, RN

## 2016-08-10 NOTE — Progress Notes (Signed)
[facility] Progress Note   Dialysis Orders: [place] MWF 4 hrs 200/500 EDW 79.5 kg 2.0 K/2.0 Ca Linear Na -No heparin -Hectorol 3 mcg IV TIW -Venofer 50 mg IV TIW Last labs: HGB 10.3 08/01/16 Ferritin 548 Fe 103 Tsat 40 Ca 8.8 C Ca 9.2 Phos 7.8 PTH 752 (07/23/16).   Assessment/Plan: 1. Volume excess/SOB- with refractory edema due to poor cardiac function; serial dialysis net UF Wed 3.5 Thur 3 L;plan HD again if here on Saturday - mod ascites on [place] will need lower threshold BP's on HD , continue to pull fluid with BP in the 80's and possibly high 70's 2. ESRD -MWF K 4.1 - 4 K bath today due to serial HD - plan HD again Saturday - will need 4 day a week HD at discharge and establish a max UF goal of 3 - 3.5 as outpatient due to inability to tolerate high UF goal.; add renal vitamins/supplement 3. Anemia - hgb 10.4 - d/w outpt hgb - on weekly Fe 4.Secondary hyperparathyroidism - P low watch- was as high as 7.8 in May as an outpatient- binders held 5. Hypotension - midodrine for BP support- needs strict fluid restriction; asked HD RN to do orthostatics post HD as she is ambulatory at home. 6. Nutrition - added vits/supplements 7. Afib - on apixiban in NSR with some ectopy- avoid low K due to hx of afib 8. DM -per primary 9. Elevated TSH - levothyroxine ^  Myriam Jacobson, PA-C [place] [phone] 08/10/2016,8:41 AM  LOS: 2 days   Pt seen, examined, agree w assess/plan as above with additions as indicated.  Kelly Splinter MD [facility] pager [phone]    cell [phone] 08/10/2016, 12:50 PM     Subjective:   Breathing ok - walks around at home  Objective Vitals:   08/10/16 0803 08/10/16 0808 08/10/16 0815 08/10/16 0830  BP: 104/60 (!) 102/52 (!) 79/55 (!) 92/52  Pulse: 79 82 82 81  Resp:      Temp:      TempSrc:      SpO2:      Weight:       Physical Exam General:NAD breathing easily on room air Heart: RRR Lungs: dim  BS anteriorly - no rales Abdomen: ++ascites/anasarca Extremities: ++ edema to hips Dialysis Access: maturing right lower AVF + bruit and right IJ   Additional Objective Labs: Basic Metabolic Panel:  Recent Labs Lab 08/08/16 1220 08/09/16 0021 08/10/16 0449  NA 134* 134*  133* 134*  K 3.8 3.1*  3.1* 4.1  CL 93* 96*  96* 94*  CO2 25 25  24 25   GLUCOSE 141* 152*  151* 109*  BUN 30* 15  17 16   CREATININE 3.79* 2.61*  2.59* 2.62*  CALCIUM 9.2 8.7*  8.7* 8.6*  PHOS  --  1.7* 2.0*   Liver Function Tests:  Recent Labs Lab 08/09/16 0021 08/10/16 0449  AST 30  30  --   ALT 15  15  --   ALKPHOS 134*  127*  --   BILITOT 1.1  1.0  --   PROT 6.7  6.8  --   ALBUMIN 2.9*  2.9*  2.9* 2.8*   No results for input(s): LIPASE, AMYLASE in the last 168 hours. CBC:  Recent Labs Lab 08/08/16 1220 08/09/16 0021 08/10/16 0449  WBC 6.9 6.3 5.0  HGB 12.2 10.4* 10.4*  HCT 37.7 30.9* 32.3*  MCV 83.0 80.1 83.0  PLT 288 176 161  Blood Culture    Component Value Date/Time   SDES FLUID PERITONEAL 11/15/2015 1207   SDES FLUID PERITONEAL 11/15/2015 1207   SPECREQUEST NONE 11/15/2015 1207   SPECREQUEST NONE 11/15/2015 1207   CULT  11/15/2015 1207    NO GROWTH 5 DAYS Performed at [place] 11/20/2015 FINAL 11/15/2015 1207   REPTSTATUS 11/15/2015 FINAL 11/15/2015 1207    Cardiac Enzymes: No results for input(s): CKTOTAL, CKMB, CKMBINDEX, TROPONINI in the last 168 hours. CBG:  Recent Labs Lab 08/08/16 1655 08/08/16 2327 08/09/16 0738 08/10/16 0733  GLUCAP 100* 109* 115* 115*   Iron Studies: No results for input(s): IRON, TIBC, TRANSFERRIN, FERRITIN in the last 72 hours. Lab Results  Component Value Date   INR 1.27 07/20/2016   INR 1.24 07/13/2016   INR 1.17 06/21/2014   Studies/Results: Dg Chest 2 View  Result Date: 08/08/2016 CLINICAL DATA:  Hypotension during the past to dialysis treatments limiting removal of fluid. The  patient for shortness of breath and swelling today. History of CHF. EXAM: CHEST  2 VIEW COMPARISON:  Portable chest x-ray of Jul 20, 2016 FINDINGS: The lungs are well-expanded. The interstitial markings are mildly prominent as compared to the previous study. There are small bilateral pleural effusions greatest on the left. The cardiac silhouette remains enlarged. The pulmonary vascularity is more engorged today. There is a dual-lumen dialysis catheter in place with the tip over the distal third of the SVC. There is calcification in the wall of the aortic arch. The observed bony thorax is unremarkable. IMPRESSION: CHF manifested by cardiomegaly and small bilateral pleural effusions. Only mild interstitial edema is observed. Electronically Signed   By: David  Martinique M.D.   On: 08/08/2016 12:43   US Abdomen Limited  Result Date: 08/09/2016 CLINICAL DATA:  Abdominal distension, evaluate for ascites. EXAM: LIMITED ABDOMEN ULTRASOUND FOR ASCITES TECHNIQUE: Limited ultrasound survey for ascites was performed in all four abdominal quadrants. COMPARISON:  Abdominal ultrasound of Jul 05, 2016 FINDINGS: There is a moderate amount of ascites observed in all 4 quadrants. The greatest volume appears to lie in the left lower quadrant. IMPRESSION: There is a moderate amount of ascites noted throughout the abdomen. Electronically Signed   By: David  Martinique M.D.   On: 08/09/2016 11:20   Medications: . sodium chloride     . apixaban  2.5 mg Oral BID  . levothyroxine  50 mcg Oral QAC breakfast  . midodrine  10 mg Oral TID WC  . midodrine  5 mg Oral Q M,W,F-HD  . sodium chloride flush  3 mL Intravenous Q12H  . sodium chloride flush  3 mL Intravenous Q12H

## 2016-08-10 NOTE — Progress Notes (Signed)
Family Medicine Teaching Service Daily Progress Note Intern Pager: [phone]  Patient name: Wendy Kerr Medical record number: [id number] Date of birth: [birth year] Age: 83 y.o. Gender: female  Primary Care Provider: System, Pcp Not In Consultants: Nephrology Code Status: Full  Pt Overview and Major Events to Date:  HD on 6/13, 6/14   Assessment and Plan: JANAY CANAN is a 83 y.o. female wit a past medical history significant for presenting with dyspnea in the setting of volume overload 2/2  Inadequate dialysis session due to hypotension.  #Dyspnea, acute, resolving Secondary to Volume overload, HD session today. Lungs on exam with no crackles and good air movement. Patient still not at baseline but is improving. Abdominal fluid has also improved but abdominal US showed moderate ascites. Patient likely need additional fluid removal.--Follow up on Nephrology consult appreciate recs. --Monitor respiratory status   #ESRD, recently started on Hemodialysis Problems with hypotension led to inappropriate volume removal from last two sessions. Midodrine prior and during session have help maintain BP around normal range. Nephrology recommend 4 sessions of HD on discharge. Patient will have another session tomorrow for additional volume removal. --Will follow up nephrology consult,  appreciate recommendations  #Elevated troponin On admission 0.85 in ED. No EKG changes noted. No chest pain. Likely demand ischemia in the setting of volume overload. Patient had 6 run of [place] yesterday but was asymptomatic. --Monitor on telemetry --F/u on repeat EKG --Cardiology consult with worsening cardiac symptoms   #Acute on chronic CHF  last echo September 2017 showed EF 40-45% with mild to moderate reduced systolic function, no wall motion abnormalities. Severe concentric hypertrophy present, right systolic pressure consistent with moderate pulmonary hypertension. BNP elevated at >4500 to be expected  in ESRD with volume overload. --Follow up on ECHO   #Pruritis, improved  Likely secondary to worsening kidney function and failing liver. Expect improvement in symptoms with dialysis. --Continue remeron and hydroxyzine as needed  #HTN  Patient has been hypotensive over past 1-2 weeks preventing her from getting dialysis. On Hydralazine 12.5 mg BID at home --Hold antihypertensive meds  --Monitor BP --Midodrine per nephrology  #HLD --Continue Atorvastatin 20mg  qd  #T2DM A1c this  Admission is 5.6.  --Fasting CBG every morning --Consider medications if needed  #AFib/flutter History of arrhythmias and followed by cardiology [facility] --Continue Eliquis 2.5 mg bid --Monitor on telemetry   #GERD  --Continue protonix 40 mg bid  #Pancreatic cystadenoma --Followed by [facility] as an outpatient  #Elevated TSH Was 10.05 on 06/26/16. Repeat TSH still elevated at 8.460. T4 is within normal limit at 1.05. Could be subclinical hypothryroidism given age and multiple comorbidities would just adjust dosage without seeking aggressive treatment. --Titrate Levothyroxine dose from 50 to 100 mcg  FEN/GI: renal diet, carb modified PPx: Currently on Eliquis  Disposition: Patient will get an additional HD session tomorrow and likely will need SNF placement which will be difficult on the weekend.  Subjective:  Patient was at dialysis this morning, but was feeling better and was in good spirit. No acute complaint.  Objective: Temp:  [97.3 F (36.3 C)-98 F (36.7 C)] 97.3 F (36.3 C) (06/15 0359) Pulse Rate:  [74-89] 83 (06/15 0359) Resp:  [16-20] 16 (06/15 0359) BP: (77-104)/(48-68) 93/53 (06/15 0359) SpO2:  [98 %-100 %] 98 % (06/15 0359) Weight:  [178 lb 9.2 oz (81 kg)-185 lb 3 oz (84 kg)] 181 lb (82.1 kg) (06/15 0453) Physical Exam:  General: elderly lady laying in bed in NAD Eyes: arcus senilis present, bilateral  cataracts, EOMI. PERRL. ENTM: moist mucous membranes. Poor dentition. No  erythema or exudate in oropharynx Neck: supple, non-tender, no LAD Cardiovascular: RRR, no MRG. JVD present.  Respiratory: No crackles present on exam, no increased work of breathing Gastrointestinal: distended, non-tender. pitting edema noted on abdomen. +BS. No obvious organomegaly. No fluid wave. MSK:  pitting edema  has improved Derm: no rashes or lesions noted Neuro: CN2-12 in tact, strength 5/5 in upper and lower extremities bilaterally, sensation in tact throughout  Psych: appropriate mood and affect  Laboratory:  Recent Labs Lab 08/08/16 1220 08/09/16 0021 08/10/16 0449  WBC 6.9 6.3 5.0  HGB 12.2 10.4* 10.4*  HCT 37.7 30.9* 32.3*  PLT 288 176 161    Recent Labs Lab 08/08/16 1220 08/09/16 0021 08/10/16 0449  NA 134* 134*  133* 134*  K 3.8 3.1*  3.1* 4.1  CL 93* 96*  96* 94*  CO2 25 25  24 25   BUN 30* 15  17 16   CREATININE 3.79* 2.61*  2.59* 2.62*  CALCIUM 9.2 8.7*  8.7* 8.6*  PROT  --  6.7  6.8  --   BILITOT  --  1.1  1.0  --   ALKPHOS  --  134*  127*  --   ALT  --  15  15  --   AST  --  30  30  --   GLUCOSE 141* 152*  151* 109*     Imaging/Diagnostic Tests: Dg Chest 2 View   IMPRESSION: CHF manifested by cardiomegaly and small bilateral pleural effusions. Only mild interstitial edema is observed. Electronically Signed   By: David  Martinique M.D.   On: 08/08/2016 12:43   US Abdomen Limited  IMPRESSION: There is a moderate amount of ascites noted throughout the abdomen. Electronically Signed   By: David  Martinique M.D.   On: 08/09/2016 11:20    Marjie Skiff, MD 08/10/2016, 7:25 AM PGY-1, [place] Intern pager: [phone], text pages welcome

## 2016-08-10 NOTE — Progress Notes (Signed)
Pt just had 7 beats of V tach. Pt denies pain. Vitals : 103/55, O2 was very low at 79 RA- pt did some deep breathing and got O2 up to 97 RA and it maintained, and pulse 85. This nurse let MD now, awaiting response.   Eleanora Neighbor, RN

## 2016-08-10 NOTE — Progress Notes (Signed)
PT Cancellation Note  Patient Details Name: Wendy Kerr MRN: [id number] DOB: 05/28/1930   Cancelled Treatment:    Reason Eval/Treat Not Completed: Patient at procedure or test/unavailable. Pt currently in dialysis. Will check back as time allows.    Scheryl Marten PT, DPT  [phone]  08/10/2016, 8:19 AM

## 2016-08-10 NOTE — Care Management Note (Signed)
Case Management Note  Patient Details  Name: Wendy Kerr MRN: [id number] Date of Birth: 25-Aug-1930  Subjective/Objective:          CM following for progression and d/c planning. Pt lives at home and per son she will return to home with resumption of [facility] services and support of family members.            Action/Plan: 08/10/2016 Met with pt and son to discuss d/c needs. Son wishes to resume [facility] services with [facility] and request a 3:1 commode, and a tub bench for home use. Orders placed  Expected Discharge Date:                  Expected Discharge Plan:  [place]  In-House Referral:  NA  Discharge planning Services  CM Consult  Post Acute Care Choice:  Durable Medical Equipment, Home Health Choice offered to:  Adult Children  DME Arranged:  3-N-1, Tub bench DME Agency:  [place] Arranged:  RN, PT, OT, Nurse's Aide [facility] Agency:  [place]  Status of Service:  In process, will continue to follow  If discussed at Long Length of Stay Meetings, dates discussed:    Additional Comments:  Adron Bene, RN 08/10/2016, 3:15 PM

## 2016-08-10 NOTE — Progress Notes (Signed)
PT Cancellation Note  Patient Details Name: DANAKA LLERA MRN: [id number] DOB: [birth year]   Cancelled Treatment:    Reason Eval/Treat Not Completed: Other (comment) Pt has recently returned from dialysis and took phenergan. Pt requesting no visit this afternoon due to fatigue. PT will check back tomorrow as time allows.    Scheryl Marten PT, DPT  [phone]  08/10/2016, 3:02 PM

## 2016-08-10 NOTE — Procedures (Signed)
  I was present at this dialysis session, have reviewed the session itself and made  appropriate changes Kelly Splinter MD [place] pager [phone]   08/10/2016, 12:58 PM

## 2016-08-11 ENCOUNTER — Inpatient Hospital Stay (HOSPITAL_COMMUNITY): Payer: Medicare Other

## 2016-08-11 DIAGNOSIS — I360 Nonrheumatic tricuspid (valve) stenosis: Secondary | ICD-10-CM

## 2016-08-11 LAB — RENAL FUNCTION PANEL
ANION GAP: 12 (ref 5–15)
Albumin: 2.7 g/dL — ABNORMAL LOW (ref 3.5–5.0)
BUN: 14 mg/dL (ref 6–20)
CO2: 26 mmol/L (ref 22–32)
Calcium: 8.7 mg/dL — ABNORMAL LOW (ref 8.9–10.3)
Chloride: 93 mmol/L — ABNORMAL LOW (ref 101–111)
Creatinine, Ser: 2.45 mg/dL — ABNORMAL HIGH (ref 0.44–1.00)
GFR calc Af Amer: 20 mL/min — ABNORMAL LOW (ref >60)
GFR calc non Af Amer: 17 mL/min — ABNORMAL LOW (ref >60)
GLUCOSE: 150 mg/dL — AB (ref 65–99)
POTASSIUM: 3.6 mmol/L (ref 3.5–5.1)
Phosphorus: 1.8 mg/dL — ABNORMAL LOW (ref 2.5–4.6)
Sodium: 131 mmol/L — ABNORMAL LOW (ref 135–145)

## 2016-08-11 LAB — CBC
HCT: 31.4 % — ABNORMAL LOW (ref 36.0–46.0)
HEMOGLOBIN: 10.3 g/dL — AB (ref 12.0–15.0)
MCH: 26.9 pg (ref 26.0–34.0)
MCHC: 32.8 g/dL (ref 30.0–36.0)
MCV: 82 fL (ref 78.0–100.0)
Platelets: 165 10*3/uL (ref 150–400)
RBC: 3.83 MIL/uL — ABNORMAL LOW (ref 3.87–5.11)
RDW: 19.9 % — ABNORMAL HIGH (ref 11.5–15.5)
WBC: 6.5 10*3/uL (ref 4.0–10.5)

## 2016-08-11 LAB — ECHOCARDIOGRAM COMPLETE
Height: 65 in
Weight: 2754.87 oz

## 2016-08-11 LAB — MAGNESIUM: MAGNESIUM: 2 mg/dL (ref 1.7–2.4)

## 2016-08-11 LAB — GLUCOSE, CAPILLARY: GLUCOSE-CAPILLARY: 97 mg/dL (ref 65–99)

## 2016-08-11 MED ORDER — MIDODRINE HCL 5 MG PO TABS
ORAL_TABLET | ORAL | Status: AC
  Filled 2016-08-11: qty 1

## 2016-08-11 NOTE — Progress Notes (Signed)
PT Cancellation Note  Patient Details Name: Wendy Kerr MRN: [id number] DOB: March 26, 1930   Cancelled Treatment:    Reason Eval/Treat Not Completed: Patient declined, no reason specified. Pt reports that she is too tired from dialysis to participate in therapy this PM. Will check back as time allows. Most likely Monday.    Scheryl Marten PT, DPT  [phone]  08/11/2016, 3:49 PM

## 2016-08-11 NOTE — Progress Notes (Signed)
PT Cancellation Note  Patient Details Name: Wendy Kerr MRN: [id number] DOB: [birth year]   Cancelled Treatment:    Reason Eval/Treat Not Completed: Patient at procedure or test/unavailable. Pt currently in HD. Will check back later as time allows and if patient is able to tolerate.   Scheryl Marten PT, DPT  [phone]   08/11/2016, 8:07 AM

## 2016-08-11 NOTE — Progress Notes (Signed)
[facility] Progress Note   Dialysis Orders: [place] MWF 4 hrs 200/500 EDW 79.5 kg 2.0 K/2.0 Ca Linear Na -No heparin -Hectorol 3 mcg IV TIW -Venofer 50 mg IV TIW Last labs: HGB 10.3 08/01/16 Ferritin 548 Fe 103 Tsat 40 Ca 8.8 C Ca 9.2 Phos 7.8 PTH 752 (07/23/16).   Assess: 1. Vol^ / dyspnea/ pulm edema - improving, down 10kg with another 10-15 to go 2. ESRD -MWF dialysis, may need 4x / wk at DC. Apparently need to allow BP's to get fluid off, cont to pull fluid with SBP in the 80's, working ok so far here. Added renal vitamins/supplement 3. Anemia - hgb 10.4 - d/w outpt hgb - on weekly Fe 4.Secondary hyperparathyroidism - P low watch- was as high as 7.8 in May as an outpatient- binders held 5. Hypotension - midodrine for BP support- needs strict fluid restriction; asked HD RN to do orthostatics post HD as she is ambulatory at home. 6. Nutrition - added vits/supplements 7. Afib - on apixiban in NSR with some ectopy- avoid low K due to hx of afib 8. DM -per primary 9. Elevated TSH - levothyroxine ^  P - HD today and then again on Monday   Kelly Splinter MD [facility] pgr [phone]   08/11/2016, 1:21 PM      Subjective:   Breathing ok   Objective Vitals:   08/11/16 1000 08/11/16 1030 08/11/16 1109 08/11/16 1150  BP: (!) 78/32 (!) 88/59 (!) 98/51 (!) 89/53  Pulse: 78 76 70 77  Resp:   16 16  Temp:   97.8 F (36.6 C) 98 F (36.7 C)  TempSrc:   Oral Oral  SpO2:   95% 97%  Weight:   78.1 kg (172 lb 2.9 oz)   Height:       Physical Exam General:NAD breathing easily on room air Heart: RRR Lungs: dim BS anteriorly - no rales Abdomen: ++ascites/anasarca Extremities: ++ edema to hips Dialysis Access: maturing right lower AVF + bruit and right IJ   Additional Objective Labs: Basic Metabolic Panel:  Recent Labs Lab 08/09/16 0021 08/10/16 0449 08/11/16 0718  NA 134*  133* 134* 131*  K 3.1*  3.1* 4.1 3.6  CL 96*  96* 94* 93*   CO2 25  24 25 26   GLUCOSE 152*  151* 109* 150*  BUN 15  17 16 14   CREATININE 2.61*  2.59* 2.62* 2.45*  CALCIUM 8.7*  8.7* 8.6* 8.7*  PHOS 1.7* 2.0* 1.8*   Liver Function Tests:  Recent Labs Lab 08/09/16 0021 08/10/16 0449 08/11/16 0718  AST 30  30  --   --   ALT 15  15  --   --   ALKPHOS 134*  127*  --   --   BILITOT 1.1  1.0  --   --   PROT 6.7  6.8  --   --   ALBUMIN 2.9*  2.9*  2.9* 2.8* 2.7*   No results for input(s): LIPASE, AMYLASE in the last 168 hours. CBC:  Recent Labs Lab 08/08/16 1220 08/09/16 0021 08/10/16 0449 08/11/16 0718  WBC 6.9 6.3 5.0 6.5  HGB 12.2 10.4* 10.4* 10.3*  HCT 37.7 30.9* 32.3* 31.4*  MCV 83.0 80.1 83.0 82.0  PLT 288 176 161 165   Blood Culture    Component Value Date/Time   SDES FLUID PERITONEAL 11/15/2015 1207   SDES FLUID PERITONEAL 11/15/2015 1207   [place] 11/15/2015 1207   [place] 11/15/2015 1207  CULT  11/15/2015 1207    NO GROWTH 5 DAYS Performed at [place] 11/20/2015 FINAL 11/15/2015 1207   REPTSTATUS 11/15/2015 FINAL 11/15/2015 1207    Cardiac Enzymes: No results for input(s): CKTOTAL, CKMB, CKMBINDEX, TROPONINI in the last 168 hours. CBG:  Recent Labs Lab 08/08/16 1655 08/08/16 2327 08/09/16 0738 08/10/16 0733 08/11/16 1147  GLUCAP 100* 109* 115* 115* 97   Iron Studies: No results for input(s): IRON, TIBC, TRANSFERRIN, FERRITIN in the last 72 hours. Lab Results  Component Value Date   INR 1.27 07/20/2016   INR 1.24 07/13/2016   INR 1.17 06/21/2014   Studies/Results: No results found. Medications: . sodium chloride     . apixaban  2.5 mg Oral BID  . feeding supplement (NEPRO CARB STEADY)  237 mL Oral BID BM  . levothyroxine  100 mcg Oral QAC breakfast  . midodrine  10 mg Oral TID WC  . midodrine  5 mg Oral Q M,W,F-HD  . multivitamin  1 tablet Oral QHS  . sodium chloride flush  3 mL Intravenous Q12H  . sodium chloride flush  3 mL  Intravenous Q12H

## 2016-08-11 NOTE — Progress Notes (Signed)
Family Medicine Teaching Service Daily Progress Note Intern Pager: [phone]  Patient name: Wendy Kerr Medical record number: [id number] Date of birth: 23-Dec-1930 Age: 83 y.o. Gender: female  Primary Care Provider: System, Pcp Not In Consultants: Nephrology Code Status: Full  Pt Overview and Major Events to Date:  HD on 6/13, 6/14   Assessment and Plan: NATURI ALARID is a 83 y.o. female wit a past medical history significant for presenting with dyspnea in the setting of volume overload 2/2  Inadequate dialysis session due to hypotension.  #Dyspnea, acute, resolving Secondary to volume overload, HD session today. Lungs on exam with no crackles and good air movement. Patient still not at baseline but is improving. Abdominal fluid has also improved but abdominal US showed moderate ascites. Patient likely need additional fluid removal --Follow up on Nephrology consult appreciate recs. --Monitor respiratory status   #ESRD, recently started on Hemodialysis Problems with hypotension led to inappropriate volume removal from last two sessions. Midodrine prior and during session have helped maintain BP around normal range. Nephrology recommend 4 sessions of HD on discharge.  --Will follow up nephrology consult,  appreciate recommendations  #Elevated troponin On admission 0.85 in ED. No EKG changes noted. No chest pain. Likely demand ischemia in the setting of volume overload. Patient had 7 run of Vtach yesterday with a slight decrease in oxygen saturation to 79% and quick return to 97% with deep breathing exercise. --Monitor on telemetry --F/u on repeat EKG --Cardiology consult with worsening cardiac symptoms   #Acute on chronic CHF  last echo September 2017 showed EF 40-45% with mild to moderate reduced systolic function, no wall motion abnormalities. Severe concentric hypertrophy present, right systolic pressure consistent with moderate pulmonary hypertension. BNP elevated at >4500  to be expected in ESRD with volume overload. --Follow up on ECHO   #Pruritis, improved  Likely secondary to worsening kidney function and failing liver. Expect improvement in symptoms with dialysis. --Continue remeron and hydroxyzine as needed  #HTN  Patient has been hypotensive over past 1-2 weeks preventing her from getting dialysis. On Hydralazine 12.5 mg BID at home --Hold antihypertensive meds  --Monitor BP --Midodrine per nephrology  #HLD --Continue Atorvastatin 20mg  qd  #T2DM A1c this  Admission is 5.6.  --Fasting CBG every morning --Consider medications if needed  #AFib/flutter History of arrhythmias and followed by cardiology [facility] --Continue Eliquis 2.5 mg bid --Monitor on telemetry   #GERD  --Continue protonix 40 mg bid  #Pancreatic cystadenoma --Followed by [facility] as an outpatient  #Elevated TSH Was 10.05 on 06/26/16. Repeat TSH still elevated at 8.460. T4 is within normal limit at 1.05. Could be subclinical hypothryroidism given age and multiple comorbidities would just adjust dosage without seeking aggressive treatment. --Continue Levothyroxine 100 mcg daily  FEN/GI: renal diet, carb modified PPx: Currently on Eliquis  Disposition: anticipate d/c home tomorrow with home health  Subjective:  Patient is doing well this morning. She was getting her HD and seem to be tolerating well.  Objective: Temp:  [97.3 F (36.3 C)-98.7 F (37.1 C)] 97.5 F (36.4 C) (06/16 0700) Pulse Rate:  [68-98] 74 (06/16 0945) Resp:  [12-16] 16 (06/16 0700) BP: (74-104)/(43-80) 80/56 (06/16 0945) SpO2:  [91 %-96 %] 94 % (06/16 0700) Weight:  [177 lb (80.3 kg)-179 lb 7.3 oz (81.4 kg)] 179 lb 7.3 oz (81.4 kg) (06/16 0700)   Physical Exam:  General: elderly lady laying in bed in NAD Eyes: arcus senilis present, bilateral cataracts, EOMI. PERRL. ENTM: moist mucous membranes. Poor dentition.  No erythema or exudate in oropharynx Neck: supple, non-tender, no  LAD Cardiovascular: RRR, no MRG. JVD present.  Respiratory: No crackles present on exam, no increased work of breathing Gastrointestinal: distended, non-tender. pitting edema noted on abdomen. +BS. No obvious organomegaly. No fluid wave. MSK:  pitting edema  has improved Derm: no rashes or lesions noted Neuro: CN2-12 in tact, strength 5/5 in upper and lower extremities bilaterally, sensation in tact throughout  Psych: appropriate mood and affect  Laboratory:  Recent Labs Lab 08/09/16 0021 08/10/16 0449 08/11/16 0718  WBC 6.3 5.0 6.5  HGB 10.4* 10.4* 10.3*  HCT 30.9* 32.3* 31.4*  PLT 176 161 165    Recent Labs Lab 08/09/16 0021 08/10/16 0449 08/11/16 0718  NA 134*  133* 134* 131*  K 3.1*  3.1* 4.1 3.6  CL 96*  96* 94* 93*  CO2 25  24 25 26   BUN 15  17 16 14   CREATININE 2.61*  2.59* 2.62* 2.45*  CALCIUM 8.7*  8.7* 8.6* 8.7*  PROT 6.7  6.8  --   --   BILITOT 1.1  1.0  --   --   ALKPHOS 134*  127*  --   --   ALT 15  15  --   --   AST 30  30  --   --   GLUCOSE 152*  151* 109* 150*     Imaging/Diagnostic Tests: Dg Chest 2 View   IMPRESSION: CHF manifested by cardiomegaly and small bilateral pleural effusions. Only mild interstitial edema is observed. Electronically Signed   By: David  Martinique M.D.   On: 08/08/2016 12:43   US Abdomen Limited  IMPRESSION: There is a moderate amount of ascites noted throughout the abdomen. Electronically Signed   By: David  Martinique M.D.   On: 08/09/2016 11:20    Marjie Skiff, MD 08/11/2016, 9:57 AM PGY-1, [place] Intern pager: [phone], text pages welcome

## 2016-08-11 NOTE — Progress Notes (Signed)
  Echocardiogram 2D Echocardiogram has been performed.  Wendy Kerr 08/11/2016, 3:03 PM

## 2016-08-12 DIAGNOSIS — I959 Hypotension, unspecified: Secondary | ICD-10-CM | POA: Diagnosis present

## 2016-08-12 LAB — GLUCOSE, CAPILLARY: Glucose-Capillary: 140 mg/dL — ABNORMAL HIGH (ref 65–99)

## 2016-08-12 NOTE — Progress Notes (Signed)
MD stated to continue to monitor.   Eleanora Neighbor, RN

## 2016-08-12 NOTE — Consult Note (Signed)
CONSULTATION NOTE   Patient Name: Wendy Kerr Date of Encounter: 08/12/2016 Cardiologist: Dr. Tamala Julian  Chief Complaint   CHF  Hospital Problem List   Active Problems:   Chronic systolic heart failure (HCC)   Fluid overload   Abdominal distension   Anasarca   End stage renal disease on dialysis [facility])   Arterial hypotension    HPI   Wendy Kerr is a 83 y.o. female who is being seen today for the evaluation of CHF at the request of Dr. Ree Kida. She is a patient of Dr. Tamala Julian with a history of ESRD on HD, A-fib on Eliquis, HTN, CAD (suspected), DM@, OSA and recent SBO with presumed pancreatic head mass based on CT findings, who was admitted for Hypotension. She was recently started on dialysis but missed 2 sessions due to hypotension. She was started on Midodrine which is increased to 10 mg 3 times a day. She is reported lower extremity swelling. She has a history of volume overload requiring IV diuretics and paracentesis in the past. She denies any chest pain. Repeat echo yesterday shows EF 40-45% (stable compared to a study in 2017) with global hypokinesis and grade 2 diastolic dysfunction and elevated LV filling pressure. There is moderate to severe tricuspid regurgitation with an RVSP of 37 mmHg. Small to moderate pericardial effusion was noted without tamponade physiology. Cardiology is asked to evaluate given hypotension and the inability to dialyze.  PMHx   Past Medical History:  Diagnosis Date  . 1st degree AV block   . Anemia   . Atrial flutter ([place])   . Benign neoplasm of pancreas, except islets of Langerhans   . Bronchitis, mucopurulent recurrent ([place])   . Cataract    B/L  . CHF (congestive heart failure) ([place])   . Chronic kidney disease   . Cirrhosis of liver ([place])    " just a little"  . Common migraine   . Diabetes mellitus ([place])   . Diverticulosis of colon   . DJD (degenerative joint disease)   . Generalized anxiety disorder   . GERD (gastroesophageal  reflux disease)   . Hypercholesterolemia   . Hypertension   . Monoclonal gammopathy   . Obesity   . OSA (obstructive sleep apnea)    severe with AHI 37 events per hour  . Vitamin D deficiency     Past Surgical History:  Procedure Laterality Date  . ABDOMINAL HYSTERECTOMY  1972  . AV FISTULA PLACEMENT Right 07/13/2016   Procedure: Creation Arteriovenous Fistula Right Arm;  Surgeon: Angelia Mould, MD;  Location: [place];  Service: Vascular;  Laterality: Right;  . COLONOSCOPY    . INSERTION OF DIALYSIS CATHETER Right 07/20/2016   Procedure: INSERTION OF TUNNELED DIALYSIS CATHETER - RIGHT INTERNAL JUGULAR;  Surgeon: Angelia Mould, MD;  Location: [place];  Service: Vascular;  Laterality: Right;  . LAPAROTOMY N/A 12/23/2015   Procedure: EXPLORATORY LAPAROTOMY FOR SMALL BOWEL OBSTRUCTION;  Surgeon: Judeth Horn, MD;  Location: [place];  Service: General;  Laterality: N/A;  . resection serous cystadenoma of pancreas  2004   at [facility].  Now (2017) with Dr Bridgett Larsson with [facility]    FAMHx   Family History  Problem Relation Age of Onset  . Hypertension Mother   . Kidney failure Mother        dialysis  . Heart Problems Mother 46  . Hypertension Father   . Kidney failure Father        dialysis  . Heart Problems  Father 35  . Kidney disease Sister   . Kidney disease Other     SOCHx    reports that she has never smoked. She has never used smokeless tobacco. She reports that she does not drink alcohol or use drugs.  Outpatient Medications   No current facility-administered medications on file prior to encounter.    Current Outpatient Prescriptions on File Prior to Encounter  Medication Sig Dispense Refill  . ELIQUIS 2.5 MG TABS tablet TAKE 1 TABLET BY MOUTH 2 TIMES DAILY. (Patient taking differently: TAKE 2.5 MG BY MOUTH 2 TIMES DAILY.) 60 tablet 11  . hydrOXYzine (ATARAX/VISTARIL) 25 MG tablet TAKE 1 TABLET (25 MG TOTAL) BY MOUTH 3 (THREE) TIMES DAILY AS NEEDED. (Patient taking  differently: Take 25 mg by mouth 3 (three) times daily as needed for anxiety or itching. ) 90 tablet 0  . IRON PO Take 1 tablet by mouth daily.     . isosorbide mononitrate (IMDUR) 60 MG 24 hr tablet Take 0.5 tablets (30 mg total) by mouth daily. 15 tablet 5  . levothyroxine (SYNTHROID) 50 MCG tablet Take 1 tablet (50 mcg total) by mouth daily before breakfast. 90 tablet 0  . mirtazapine (REMERON) 15 MG tablet Take 15 mg by mouth at bedtime as needed (for itching or sleep).   2  . Multiple Vitamin (MULTIVITAMIN WITH MINERALS) TABS tablet Take 1 tablet by mouth every morning.    Marland Kitchen oxyCODONE-acetaminophen (ROXICET) 5-325 MG tablet Take 1 tablet by mouth every 4 (four) hours as needed. (Patient taking differently: Take 1 tablet by mouth every 4 (four) hours as needed for moderate pain. ) 10 tablet 0  . pantoprazole (PROTONIX) 40 MG tablet Take 40 mg by mouth 2 (two) times daily as needed (for acid reflux).     . Vitamin D, Ergocalciferol, (DRISDOL) 50000 units CAPS capsule Take 1 capsule (50,000 Units total) by mouth every 7 (seven) days. (Patient taking differently: Take 50,000 Units by mouth every Monday. ) 12 capsule 3  . atorvastatin (LIPITOR) 20 MG tablet Take 1 tablet (20 mg total) by mouth daily at 6 PM. (Patient not taking: Reported on 08/09/2016) 30 tablet 6  . hydrALAZINE (APRESOLINE) 25 MG tablet Take 0.5 tablets (12.5 mg total) by mouth 2 (two) times daily. (Patient not taking: Reported on 08/09/2016)    . nitroGLYCERIN (NITROSTAT) 0.4 MG SL tablet Place 1 tablet (0.4 mg total) under the tongue every 5 (five) minutes as needed for chest pain. 5 tablet 3  . torsemide (DEMADEX) 20 MG tablet Take 1 tablet (20 mg total) by mouth 2 (two) times daily. (Patient not taking: Reported on 08/09/2016) 60 tablet 3    Inpatient Medications    Scheduled Meds: . apixaban  2.5 mg Oral BID  . feeding supplement (NEPRO CARB STEADY)  237 mL Oral BID BM  . levothyroxine  100 mcg Oral QAC breakfast  . midodrine   10 mg Oral TID WC  . midodrine  5 mg Oral Q M,W,F-HD  . multivitamin  1 tablet Oral QHS  . sodium chloride flush  3 mL Intravenous Q12H  . sodium chloride flush  3 mL Intravenous Q12H    Continuous Infusions: . sodium chloride      PRN Meds: sodium chloride, acetaminophen **OR** acetaminophen, hydrOXYzine, mirtazapine, nitroGLYCERIN, pantoprazole, promethazine, sodium chloride flush   ALLERGIES   Allergies  Allergen Reactions  . Piroxicam Other (See Comments)    REACTION: HEADACHE    ROS   Pertinent items noted in HPI  and remainder of comprehensive ROS otherwise negative.  Vitals   Vitals:   08/11/16 2132 08/12/16 0210 08/12/16 0624 08/12/16 0900  BP: 97/61  94/60 (!) 85/52  Pulse: 97  85 83  Resp: 18   18  Temp: 98.5 F (36.9 C)  98.4 F (36.9 C) 97.7 F (36.5 C)  TempSrc: Oral  Oral Oral  SpO2: 95%  99% 98%  Weight:  172 lb 2.9 oz (78.1 kg) 173 lb 12.8 oz (78.8 kg)   Height:        Intake/Output Summary (Last 24 hours) at 08/12/16 1144 Last data filed at 08/12/16 0200  Gross per 24 hour  Intake              720 ml  Output                0 ml  Net              720 ml   Filed Weights   08/11/16 1109 08/12/16 0210 08/12/16 0624  Weight: 172 lb 2.9 oz (78.1 kg) 172 lb 2.9 oz (78.1 kg) 173 lb 12.8 oz (78.8 kg)    Physical Exam   General appearance: alert and no distress Neck: JVD - 3 cm above sternal notch and no carotid bruit Lungs: diminished breath sounds bilaterally Heart: regular rate and rhythm Abdomen: soft, non-tender; bowel sounds normal; no masses,  no organomegaly and protuberant Extremities: edema anasarcic Pulses: 2+ and symmetric Skin: Skin color, texture, turgor normal. No rashes or lesions Neurologic: Grossly normal Psych: Pleasant  Labs   Results for orders placed or performed during the hospital encounter of 08/08/16 (from the past 48 hour(s))  CBC     Status: Abnormal   Collection Time: 08/11/16  7:18 AM  Result Value Ref Range    WBC 6.5 4.0 - 10.5 K/uL   RBC 3.83 (L) 3.87 - 5.11 MIL/uL   Hemoglobin 10.3 (L) 12.0 - 15.0 g/dL   HCT 31.4 (L) 36.0 - 46.0 %   MCV 82.0 78.0 - 100.0 fL   MCH 26.9 26.0 - 34.0 pg   MCHC 32.8 30.0 - 36.0 g/dL   RDW 19.9 (H) 11.5 - 15.5 %   Platelets 165 150 - 400 K/uL  Renal function panel     Status: Abnormal   Collection Time: 08/11/16  7:18 AM  Result Value Ref Range   Sodium 131 (L) 135 - 145 mmol/L   Potassium 3.6 3.5 - 5.1 mmol/L   Chloride 93 (L) 101 - 111 mmol/L   CO2 26 22 - 32 mmol/L   Glucose, Bld 150 (H) 65 - 99 mg/dL   BUN 14 6 - 20 mg/dL   Creatinine, Ser 2.45 (H) 0.44 - 1.00 mg/dL   Calcium 8.7 (L) 8.9 - 10.3 mg/dL   Phosphorus 1.8 (L) 2.5 - 4.6 mg/dL   Albumin 2.7 (L) 3.5 - 5.0 g/dL   GFR calc non Af Amer 17 (L) >60 mL/min   GFR calc Af Amer 20 (L) >60 mL/min    Comment: (NOTE) The eGFR has been calculated using the CKD EPI equation. This calculation has not been validated in all clinical situations. eGFR's persistently <60 mL/min signify possible Chronic Kidney Disease.    Anion gap 12 5 - 15  Glucose, capillary     Status: None   Collection Time: 08/11/16 11:47 AM  Result Value Ref Range   Glucose-Capillary 97 65 - 99 mg/dL  Magnesium     Status: None  Collection Time: 08/11/16 12:00 PM  Result Value Ref Range   Magnesium 2.0 1.7 - 2.4 mg/dL  Glucose, capillary     Status: Abnormal   Collection Time: 08/12/16  7:59 AM  Result Value Ref Range   Glucose-Capillary 140 (H) 65 - 99 mg/dL    ECG   N/A  Telemetry   NSR - Personally Reviewed  Radiology   No results found.  Cardiac Studies   LV EF: 40% -   45%  ------------------------------------------------------------------- History:   PMH:   Dyspnea.  Atrial flutter.  Risk factors: Hypertension. Diabetes mellitus. Dyslipidemia.  ------------------------------------------------------------------- Study Conclusions  - Left ventricle: The cavity size was normal. Wall thickness was    increased in a pattern of severe LVH. Systolic function was   mildly to moderately reduced. The estimated ejection fraction was   in the range of 40% to 45%. Diffuse hypokinesis. Features are   consistent with a pseudonormal left ventricular filling pattern,   with concomitant abnormal relaxation and increased filling   pressure (grade 2 diastolic dysfunction). Doppler parameters are   consistent with high ventricular filling pressure. - Aortic valve: Mildly calcified annulus. Trileaflet; mildly   thickened leaflets. Valve area (VTI): 2.34 cm^2. Valve area   (Vmax): 2.04 cm^2. Valve area (Vmean): 2.4 cm^2. - Mitral valve: Mildly calcified annulus. Mildly thickened leaflets   . There was mild regurgitation. - Left atrium: The atrium was moderately dilated. - Right ventricle: The cavity size was mildly to moderately   dilated. Systolic function was moderately reduced. - Right atrium: The atrial septum bows to the left, suggesting   elevated RA pressure. The atrium was moderately dilated. - Tricuspid valve: There was moderate-severe regurgitation. - Pulmonary arteries: Systolic pressure was mildly increased. PA   peak pressure: 37 mm Hg (S). - Inferior vena cava: The vessel was dilated. The respirophasic   diameter changes were blunted (< 50%), consistent with elevated   central venous pressure. - Pericardium, extracardiac: There is a small to moderate   pericardial effusion adjacent to the LV, measuring 1.1 cm in   diastole. There is no evidence of tamponade physiology.  Impression   Active Problems:   Chronic systolic heart failure (HCC)   Fluid overload   Abdominal distension   Anasarca   End stage renal disease on dialysis (HCC)   Arterial hypotension   Recommendation   1. Wendy Kerr is improving with dialysis. LVEF is stable on echo and not likely the cause of her hypotension - more likely autonomic dysfunction related to renal disease. Agree with volume removal and  protein supplementation. Continue midodrine. No further cardiac recommendations at this time. Follow-up with Dr. Tamala Julian as an outpatient.  Thanks for the consultation. Call with questions.  Time Spent Directly with Patient:  I have spent a total of 45 minutes with the patient reviewing hospital notes, telemetry, EKGs, labs and examining the patient as well as establishing an assessment and plan that was discussed personally with the patient. > 50% of time was spent in direct patient care.  Length of Stay:  LOS: 4 days   Pixie Casino, MD, [place]  Attending Cardiologist  Direct Dial: [phone]  Fax: [phone]  Website: [email or web address] [email or web address] 08/12/2016, 11:44 AM

## 2016-08-12 NOTE — Progress Notes (Signed)
[facility] Progress Note   Dialysis Orders: [facility] MWF 4 hrs 200/500 EDW 79.5 kg 2.0 K/2.0 Ca Linear Na -No heparin -Hectorol 3 mcg IV TIW -Venofer 50 mg IV TIW Last labs: HGB 10.3 08/01/16 Ferritin 548 Fe 103 Tsat 40 Ca 8.8 C Ca 9.2 Phos 7.8 PTH 752 (07/23/16).   Assess: 1. Vol^ / dyspnea/ pulm edema - improving, down 10kg with another 10kg minimum to go 2. ESRD -MWF dialysis, may need 4x / wk at DC. Cont to pull fluid w SBP in 80's, pt tolerates well 3. Anemia - hgb 10.4 - d/w outpt hgb - on weekly Fe 4. Secondary hyperparathyroidism - P low watch- was as high as 7.8 in May as an outpatient- binders held 5. Hypotension - midodrine for BP support- needs strict fluid restriction, talked w her at length about her role in keeping fluid off 6. Nutrition - added vits/supplements 7. Afib - on apixiban in NSR with some ectopy- avoid low K due to hx of afib 8. DM -per primary 9. Elevated TSH - levothyroxine ^  P - cont serial HD with HD tomorrow, lowering of dry wt   Kelly Splinter MD [facility] pgr [phone]   08/12/2016, 12:44 PM      Subjective:   Breathing ok   Objective Vitals:   08/11/16 2132 08/12/16 0210 08/12/16 0624 08/12/16 0900  BP: 97/61  94/60 (!) 85/52  Pulse: 97  85 83  Resp: 18   18  Temp: 98.5 F (36.9 C)  98.4 F (36.9 C) 97.7 F (36.5 C)  TempSrc: Oral  Oral Oral  SpO2: 95%  99% 98%  Weight:  78.1 kg (172 lb 2.9 oz) 78.8 kg (173 lb 12.8 oz)   Height:       Physical Exam General:NAD breathing easily on room air Heart: RRR Lungs: dim BS anteriorly - no rales Abdomen: ++ascites/anasarca Extremities: ++ edema to hips Dialysis Access: maturing right lower AVF + bruit and right IJ   Additional Objective Labs: Basic Metabolic Panel:  Recent Labs Lab 08/09/16 0021 08/10/16 0449 08/11/16 0718  NA 134*  133* 134* 131*  K 3.1*  3.1* 4.1 3.6  CL 96*  96* 94* 93*  CO2 25  24 25 26   GLUCOSE 152*  151* 109*  150*  BUN 15  17 16 14   CREATININE 2.61*  2.59* 2.62* 2.45*  CALCIUM 8.7*  8.7* 8.6* 8.7*  PHOS 1.7* 2.0* 1.8*   Liver Function Tests:  Recent Labs Lab 08/09/16 0021 08/10/16 0449 08/11/16 0718  AST 30  30  --   --   ALT 15  15  --   --   ALKPHOS 134*  127*  --   --   BILITOT 1.1  1.0  --   --   PROT 6.7  6.8  --   --   ALBUMIN 2.9*  2.9*  2.9* 2.8* 2.7*   No results for input(s): LIPASE, AMYLASE in the last 168 hours. CBC:  Recent Labs Lab 08/08/16 1220 08/09/16 0021 08/10/16 0449 08/11/16 0718  WBC 6.9 6.3 5.0 6.5  HGB 12.2 10.4* 10.4* 10.3*  HCT 37.7 30.9* 32.3* 31.4*  MCV 83.0 80.1 83.0 82.0  PLT 288 176 161 165   Blood Culture    Component Value Date/Time   SDES FLUID PERITONEAL 11/15/2015 1207   SDES FLUID PERITONEAL 11/15/2015 1207   [place] 11/15/2015 [place] 11/15/2015 1207   CULT  11/15/2015 1207  NO GROWTH 5 DAYS Performed at [facility] 11/20/2015 FINAL 11/15/2015 1207   REPTSTATUS 11/15/2015 FINAL 11/15/2015 1207    Cardiac Enzymes: No results for input(s): CKTOTAL, CKMB, CKMBINDEX, TROPONINI in the last 168 hours. CBG:  Recent Labs Lab 08/08/16 2327 08/09/16 0738 08/10/16 0733 08/11/16 1147 08/12/16 0759  GLUCAP 109* 115* 115* 97 140*   Iron Studies: No results for input(s): IRON, TIBC, TRANSFERRIN, FERRITIN in the last 72 hours. Lab Results  Component Value Date   INR 1.27 07/20/2016   INR 1.24 07/13/2016   INR 1.17 06/21/2014   Studies/Results: No results found. Medications: . sodium chloride     . apixaban  2.5 mg Oral BID  . feeding supplement (NEPRO CARB STEADY)  237 mL Oral BID BM  . levothyroxine  100 mcg Oral QAC breakfast  . midodrine  10 mg Oral TID WC  . midodrine  5 mg Oral Q M,W,F-HD  . multivitamin  1 tablet Oral QHS  . sodium chloride flush  3 mL Intravenous Q12H  . sodium chloride flush  3 mL Intravenous Q12H

## 2016-08-12 NOTE — Progress Notes (Signed)
Family Medicine Teaching Service Daily Progress Note Intern Pager: [phone]  Patient name: Wendy Kerr Medical record number: [id number] Date of birth: 1930/06/09 Age: 83 y.o. Gender: female  Primary Care Provider: System, Pcp Not In Consultants: Nephrology Code Status: Full  Pt Overview and Major Events to Date:  HD on 6/13, 6/14   Assessment and Plan: Wendy Kerr is a 83 y.o. female wit a past medical history significant for presenting with dyspnea in the setting of volume overload 2/2  Inadequate dialysis session due to hypotension.  #Dyspnea, acute, resolving, 2/2 volume overload in setting of missed HD and acute on chronic CHF Patient still not at baseline but is improving. Abdominal fluid has also improved but abdominal US showed moderate ascites. Patient likely need additional fluid removal --Follow up on Nephrology consult appreciate recs. --Monitor respiratory status   #ESRD, recently started on Hemodialysis Problems with hypotension led to inappropriate volume removal from last two sessions. Midodrine prior and during session have helped maintain BP around normal range. --Will follow up nephrology consult,  appreciate recommendations - planning for HD 6/18  #Elevated troponin On admission 0.85 in ED. No EKG changes noted. No chest pain. Likely demand ischemia in the setting of volume overload. Patient had 7 run of Vtach 6/15 with a slight decrease in oxygen saturation to 79% and quick return to 97% with deep breathing exercise. --Monitor on telemetry --Cardiology consult with worsening cardiac symptoms   #Acute on chronic CHF Echo (6/16) with EF 40-45% (stable from 2017), G2DD, diffuse hypokinesis (new finding) and mod-severe tricuspid regurg - HD as above   #Pruritis, improved  Likely secondary to worsening kidney function and failing liver. Expect improvement in symptoms with dialysis. --Continue remeron and hydroxyzine as needed  #HTN  Patient has been  hypotensive over past 1-2 weeks preventing her from getting dialysis. On Hydralazine 12.5 mg BID at home --Hold antihypertensive meds  --Monitor BP --Midodrine per nephrology  #HLD --Continue Atorvastatin 20mg  qd  #T2DM A1c this admission is 5.6.  --Fasting CBG every morning --Consider medications if needed  #AFib/flutter History of arrhythmias and followed by cardiology [facility] --Continue Eliquis 2.5 mg bid --Monitor on telemetry   #GERD  --Continue protonix 40 mg bid  #Pancreatic cystadenoma --Follow[facility] as an outpatient  #Elevated TSH Was 10.05 on 06/26/16. Repeat TSH still elevated at 8.460. T4 is within normal limit at 1.05. Could be subclinical hypothryroidism given age and multiple comorbidities would just adjust dosage without seeking aggressive treatment. --Continue Levothyroxine 100 mcg daily  FEN/GI: renal diet, carb modified PPx: Currently on Eliquis  Disposition: anticipate d/c home tomorrow with home health after HD  Subjective:  Patient is doing well this morning. She continues to feel SOB with movement.  Objective: Temp:  [97.7 F (36.5 C)-98.5 F (36.9 C)] 98.4 F (36.9 C) (06/17 0624) Pulse Rate:  [70-97] 85 (06/17 0624) Resp:  [16-18] 18 (06/16 2132) BP: (78-100)/(32-64) 94/60 (06/17 0624) SpO2:  [95 %-99 %] 99 % (06/17 0624) Weight:  [172 lb 2.9 oz (78.1 kg)-173 lb 12.8 oz (78.8 kg)] 173 lb 12.8 oz (78.8 kg) (06/17 6144)   Physical Exam:  General: elderly lady laying in bed in NAD Eyes: arcus senilis present, bilateral cataracts, EOMI. PERRL. ENTM: moist mucous membranes. Poor dentition Cardiovascular: RRR, no MRG. JVD present.  Respiratory: bibasilar crackles, no increased work of breathing Gastrointestinal: distended, non-tender. pitting edema noted on abdomen. +BS. No obvious organomegaly. No fluid wave. MSK:  pitting edema  has improved Derm: no  rashes or lesions noted Psych: appropriate mood and affect  Laboratory:  Recent  Labs Lab 08/09/16 0021 08/10/16 0449 08/11/16 0718  WBC 6.3 5.0 6.5  HGB 10.4* 10.4* 10.3*  HCT 30.9* 32.3* 31.4*  PLT 176 161 165    Recent Labs Lab 08/09/16 0021 08/10/16 0449 08/11/16 0718  NA 134*  133* 134* 131*  K 3.1*  3.1* 4.1 3.6  CL 96*  96* 94* 93*  CO2 25  24 25 26   BUN 15  17 16 14   CREATININE 2.61*  2.59* 2.62* 2.45*  CALCIUM 8.7*  8.7* 8.6* 8.7*  PROT 6.7  6.8  --   --   BILITOT 1.1  1.0  --   --   ALKPHOS 134*  127*  --   --   ALT 15  15  --   --   AST 30  30  --   --   GLUCOSE 152*  151* 109* 150*     Imaging/Diagnostic Tests: Dg Chest 2 View   IMPRESSION: CHF manifested by cardiomegaly and small bilateral pleural effusions. Only mild interstitial edema is observed. Electronically Signed   By: David  Martinique M.D.   On: 08/08/2016 12:43   US Abdomen Limited  IMPRESSION: There is a moderate amount of ascites noted throughout the abdomen. Electronically Signed   By: David  Martinique M.D.   On: 08/09/2016 11:20   Echo (6/16): EF 40-45%, G2DD, diffuse hypokinesis, mod-severe tricuspid regurg  Virginia Crews, MD 08/12/2016, 9:04 AM PGY-3, [place] Intern pager: [phone], text pages welcome

## 2016-08-12 NOTE — Progress Notes (Signed)
Pt just had 19 runs of V tach per tele. Vitals: 93/62, P 67, O2 94 RA. Pt denies pain and discomfort. Let MD on call know and awaiting response.   Eleanora Neighbor, RN

## 2016-08-13 ENCOUNTER — Other Ambulatory Visit: Payer: Self-pay

## 2016-08-13 DIAGNOSIS — R296 Repeated falls: Secondary | ICD-10-CM | POA: Diagnosis not present

## 2016-08-13 DIAGNOSIS — G4733 Obstructive sleep apnea (adult) (pediatric): Secondary | ICD-10-CM | POA: Diagnosis not present

## 2016-08-13 DIAGNOSIS — N186 End stage renal disease: Secondary | ICD-10-CM

## 2016-08-13 DIAGNOSIS — I129 Hypertensive chronic kidney disease with stage 1 through stage 4 chronic kidney disease, or unspecified chronic kidney disease: Secondary | ICD-10-CM | POA: Diagnosis not present

## 2016-08-13 DIAGNOSIS — I5042 Chronic combined systolic (congestive) and diastolic (congestive) heart failure: Secondary | ICD-10-CM | POA: Diagnosis not present

## 2016-08-13 DIAGNOSIS — I5022 Chronic systolic (congestive) heart failure: Secondary | ICD-10-CM

## 2016-08-13 DIAGNOSIS — I9589 Other hypotension: Secondary | ICD-10-CM

## 2016-08-13 DIAGNOSIS — Z992 Dependence on renal dialysis: Principal | ICD-10-CM

## 2016-08-13 LAB — GLUCOSE, CAPILLARY: GLUCOSE-CAPILLARY: 124 mg/dL — AB (ref 65–99)

## 2016-08-13 LAB — CBC
HCT: 31.4 % — ABNORMAL LOW (ref 36.0–46.0)
Hemoglobin: 10.6 g/dL — ABNORMAL LOW (ref 12.0–15.0)
MCH: 27.5 pg (ref 26.0–34.0)
MCHC: 33.8 g/dL (ref 30.0–36.0)
MCV: 81.6 fL (ref 78.0–100.0)
Platelets: 208 K/uL (ref 150–400)
RBC: 3.85 MIL/uL — ABNORMAL LOW (ref 3.87–5.11)
RDW: 20.2 % — ABNORMAL HIGH (ref 11.5–15.5)
WBC: 7.9 10*3/uL (ref 4.0–10.5)

## 2016-08-13 LAB — RENAL FUNCTION PANEL
Albumin: 2.7 g/dL — ABNORMAL LOW (ref 3.5–5.0)
Anion gap: 11 (ref 5–15)
BUN: 21 mg/dL — ABNORMAL HIGH (ref 6–20)
CO2: 26 mmol/L (ref 22–32)
Calcium: 8.6 mg/dL — ABNORMAL LOW (ref 8.9–10.3)
Chloride: 93 mmol/L — ABNORMAL LOW (ref 101–111)
Creatinine, Ser: 3.24 mg/dL — ABNORMAL HIGH (ref 0.44–1.00)
GFR calc Af Amer: 14 mL/min — ABNORMAL LOW (ref >60)
GFR calc non Af Amer: 12 mL/min — ABNORMAL LOW (ref >60)
Glucose, Bld: 161 mg/dL — ABNORMAL HIGH (ref 65–99)
Phosphorus: 2.5 mg/dL (ref 2.5–4.6)
Potassium: 3.7 mmol/L (ref 3.5–5.1)
Sodium: 130 mmol/L — ABNORMAL LOW (ref 135–145)

## 2016-08-13 MED ORDER — LIDOCAINE HCL (PF) 1 % IJ SOLN: 5.0000 mL | INTRAMUSCULAR | Status: DC | PRN

## 2016-08-13 MED ORDER — ALTEPLASE 2 MG IJ SOLR: 2.0000 mg | Freq: Once | INTRAMUSCULAR | Status: DC | PRN

## 2016-08-13 MED ORDER — MIDODRINE HCL 5 MG PO TABS
ORAL_TABLET | ORAL | Status: AC
  Administered 2016-08-13: 5 mg via ORAL
  Filled 2016-08-13: qty 1

## 2016-08-13 MED ORDER — HEPARIN SODIUM (PORCINE) 1000 UNIT/ML DIALYSIS
1000.0000 [IU] | INTRAMUSCULAR | Status: DC | PRN
  Administered 2016-08-13: 1000 [IU] via INTRAVENOUS_CENTRAL

## 2016-08-13 MED ORDER — SODIUM CHLORIDE 0.9 % IV SOLN: 100.0000 mL | INTRAVENOUS | Status: DC | PRN

## 2016-08-13 MED ORDER — LIDOCAINE-PRILOCAINE 2.5-2.5 % EX CREA: 1.0000 "application " | TOPICAL_CREAM | CUTANEOUS | Status: DC | PRN

## 2016-08-13 MED ORDER — PENTAFLUOROPROP-TETRAFLUOROETH EX AERO: 1.0000 "application " | INHALATION_SPRAY | CUTANEOUS | Status: DC | PRN

## 2016-08-13 NOTE — Progress Notes (Signed)
HD tx initiated via HD cath w/o problem, pull/push/flush w/o problem, will cont to monitor while in HD

## 2016-08-13 NOTE — Progress Notes (Signed)
Family Medicine Teaching Service Daily Progress Note Intern Pager: [phone]  Patient name: Wendy Kerr Medical record number: [id number] Date of birth: August 28, 1930 Age: 83 y.o. Gender: female  Primary Care Provider: System, Pcp Not In Consultants: Nephrology Code Status: Full  Pt Overview and Major Events to Date:  HD on 6/13, 6/14   Assessment and Plan: Wendy Kerr is a 83 y.o. female wit a past medical history significant for presenting with dyspnea in the setting of volume overload 2/2  Inadequate dialysis session due to hypotension.  #Dyspnea, acute, resolving, 2/2 volume overload in setting of insufficient volume removal and acute on chronic CHF Patient still not at baseline but is improving. Abdominal fluid has also improved but abdominal US showed moderate ascites. Discussed patient with nephrology who recommends keeping patient in the hospital for additional volume removal and optimization of volume status. --Follow up on Nephrology consult appreciate recs. --Monitor respiratory status   #ESRD, recently started on Hemodialysis Problems with hypotension led to inappropriate volume removal from last two sessions. Midodrine prior and during session have helped maintain BP around normal range. Nephrology recommend longer inpatient for additional volume status optimization and midodrine titration. Patient continue to be hypotensive.   --Will follow up nephrology consult, appreciate recommendations   possible for HD 6/19  #Elevated troponin, stable On admission 0.85 in ED. No EKG changes noted. No chest pain. Likely demand ischemia in the setting of volume overload. Patient had 19 run of Vtach on 6/17 but asymptomatic. Patient with known history of AV Block type 1 likely secondary to electrolytes abnormalities --Monitor on telemetry --Cardiology consult with worsening cardiac symptoms --Will follow up with outpatient cardiologist   #Acute on chronic CHF Echo (6/16) with  EF 40-45% (stable from 2017), G2DD, diffuse hypokinesis (new finding) and mod-severe tricuspid regurgitation. Seen by Cardiology who felt LVEF was stable not causing hypotension. Recommended outpatient follow up. - HD as above   #Pruritis, improved  Likely secondary to worsening kidney function and failing liver. Expect improvement in symptoms with dialysis. --Continue remeron and hydroxyzine as needed  #HTN Patient has been hypotensive over past 1-2 weeks preventing her from getting dialysis. On Hydralazine 12.5 mg BID at home --Hold antihypertensive meds  --Monitor BP --Midodrine per nephrology  #HLD --Continue Atorvastatin 20mg  qd  #T2DM A1c this admission is 5.6.  --Fasting CBG every morning --Consider medications if needed  #AFib/flutter History of arrhythmias and followed by cardiology [facility] --Continue Eliquis 2.5 mg bid --Monitor on telemetry   #GERD  --Continue protonix 40 mg bid  #Pancreatic cystadenoma --Followed by [facility] as an outpatient  #Elevated TSH Was 10.05 on 06/26/16. Repeat TSH still elevated at 8.460. T4 is within normal limit at 1.05. Could be subclinical hypothryroidism given age and multiple comorbidities would just adjust dosage without seeking aggressive treatment. --Continue Levothyroxine 100 mcg daily  PT/OT recommended SNF but family after discussing with SW would prefer Home Health.  FEN/GI: renal diet, carb modified PPx: Currently on Eliquis  Disposition: anticipate d/c home with home health, nephrology managing volume overload  Subjective:  Patient is doing well this morning, shortness of breath has improved. Patient talked about fluid control and attempt to do better.  Objective: Temp:  [97.4 F (36.3 C)-98.6 F (37 C)] 97.7 F (36.5 C) (06/18 1000) Pulse Rate:  [76-87] 81 (06/18 1000) Resp:  [16-18] 18 (06/18 1000) BP: (93-99)/(62-73) 96/73 (06/18 1000) SpO2:  [94 %-97 %] 97 % (06/18 1000) Weight:  [186 lb 4.6 oz (84.5 kg)-186  lb  4.8 oz (84.5 kg)] 186 lb 4.6 oz (84.5 kg) (06/18 0204)   Physical Exam:  General: elderly lady laying in bed in NAD Eyes: arcus senilis present, bilateral cataracts, EOMI. PERRL. ENTM: moist mucous membranes. Poor dentition Cardiovascular: RRR, no MRG. JVD present.  Respiratory: bibasilar crackles, no increased work of breathing Gastrointestinal: distended, non-tender. pitting edema noted on abdomen. +BS. No obvious organomegaly. No fluid wave. MSK:  pitting edema  has improved Derm: no rashes or lesions noted Psych: appropriate mood and affect  Laboratory:  Recent Labs Lab 08/09/16 0021 08/10/16 0449 08/11/16 0718  WBC 6.3 5.0 6.5  HGB 10.4* 10.4* 10.3*  HCT 30.9* 32.3* 31.4*  PLT 176 161 165    Recent Labs Lab 08/09/16 0021 08/10/16 0449 08/11/16 0718  NA 134*  133* 134* 131*  K 3.1*  3.1* 4.1 3.6  CL 96*  96* 94* 93*  CO2 25  24 25 26   BUN 15  17 16 14   CREATININE 2.61*  2.59* 2.62* 2.45*  CALCIUM 8.7*  8.7* 8.6* 8.7*  PROT 6.7  6.8  --   --   BILITOT 1.1  1.0  --   --   ALKPHOS 134*  127*  --   --   ALT 15  15  --   --   AST 30  30  --   --   GLUCOSE 152*  151* 109* 150*     Imaging/Diagnostic Tests: Dg Chest 2 View   IMPRESSION: CHF manifested by cardiomegaly and small bilateral pleural effusions. Only mild interstitial edema is observed. Electronically Signed   By: David  Martinique M.D.   On: 08/08/2016 12:43   US Abdomen Limited  IMPRESSION: There is a moderate amount of ascites noted throughout the abdomen. Electronically Signed   By: David  Martinique M.D.   On: 08/09/2016 11:20   Echo (6/16): EF 40-45%, G2DD, diffuse hypokinesis, mod-severe tricuspid regurg  Marjie Skiff, MD 08/13/2016, 1:43 PM PGY-3, [place] Intern pager: [phone], text pages welcome

## 2016-08-13 NOTE — Progress Notes (Signed)
Physical Therapy Treatment Patient Details Name: Wendy Kerr MRN: [id number] DOB: 14-Oct-1930 Today's Date: 08/13/2016    History of Present Illness 83 Y/O F with PMX of Vit D deficiency, OSA, Monoclonal Gammopathy.HTN, HLD, GERD, DJD, DM2, CKD, CHF, Aflutter, presented with hx of SOB worsening over the past week mostly on exertion.    PT Comments    Continuing work on functional mobility and activity tolerance;  Noting improving activity tolerance, greater amb distance; Still with DOE, 2/4; will consider continuous pulse ox monitoring next walk   Follow Up Recommendations  SNF     Equipment Recommendations  None recommended by PT    Recommendations for Other Services       Precautions / Restrictions Precautions Precautions: Fall Precaution Comments: Noted DOE Restrictions Weight Bearing Restrictions: No    Mobility  Bed Mobility Overal bed mobility: Needs Assistance Bed Mobility: Supine to Sit     Supine to sit: Min assist     General bed mobility comments: Min handheld assist to pull to sit  Transfers Overall transfer level: Needs assistance Equipment used: Rolling walker (2 wheeled) Transfers: Sit to/from Stand Sit to Stand: Min assist         General transfer comment: Cues for hand placement; min assist initially to power up  Ambulation/Gait Ambulation/Gait assistance: Min guard Ambulation Distance (Feet): 50 Feet Assistive device: Rolling walker (2 wheeled) Gait Pattern/deviations: Step-through pattern;Decreased step length - right;Decreased step length - left     General Gait Details: Cues to self-monitor for activity tolerance; noted DOE 2/4 with amb on Room Air; sat and rested; O2 sats 100 percent after approx 3 minutes of seated rest   Stairs            Wheelchair Mobility    Modified Rankin (Stroke Patients Only)       Balance     Sitting balance-Leahy Scale: Fair       Standing balance-Leahy Scale: Poor Standing balance  comment: RW for support                            Cognition Arousal/Alertness: Awake/alert Behavior During Therapy: WFL for tasks assessed/performed Overall Cognitive Status: Within Functional Limits for tasks assessed                                        Exercises      General Comments        Pertinent Vitals/Pain Pain Assessment: No/denies pain Pain Location: Denies pain, but tells me she has been itching for quite a while    Home Living                      Prior Function            PT Goals (current goals can now be found in the care plan section) Acute Rehab PT Goals Patient Stated Goal: to get back to normal PT Goal Formulation: With patient Time For Goal Achievement: 08/23/16 Potential to Achieve Goals: Good Progress towards PT goals: Progressing toward goals    Frequency    Min 2X/week      PT Plan Current plan remains appropriate    Co-evaluation              AM-PAC PT "6 Clicks" Daily Activity  Outcome Measure  Difficulty turning over in bed (  including adjusting bedclothes, sheets and blankets)?: None Difficulty moving from lying on back to sitting on the side of the bed? : Total Difficulty sitting down on and standing up from a chair with arms (e.g., wheelchair, bedside commode, etc,.)?: A Lot Help needed moving to and from a bed to chair (including a wheelchair)?: A Little Help needed walking in hospital room?: A Little Help needed climbing 3-5 steps with a railing? : A Lot 6 Click Score: 15    End of Session Equipment Utilized During Treatment: Gait belt Activity Tolerance: Patient limited by fatigue Patient left: with call bell/phone within reach;Other (comment) (On BSC) Nurse Communication: Mobility status;Other (comment) (Left pt on BSC with call bell close) PT Visit Diagnosis: Unsteadiness on feet (R26.81);Muscle weakness (generalized) (M62.81);Difficulty in walking, not elsewhere classified  (R26.2)     Time: 3128-1188 PT Time Calculation (min) (ACUTE ONLY): 18 min  Charges:  $Gait Training: 8-22 mins                    G Codes:       Roney Marion, PT  Acute Rehabilitation Services Pager [phone] Office [place] 08/13/2016, 11:51 AM

## 2016-08-13 NOTE — Progress Notes (Signed)
HD tx completed _0  w/o problem, UF goal met, blood rinsed back, report called to Alden Server, RN

## 2016-08-13 NOTE — Progress Notes (Signed)
[facility] Progress Note   Subjective: "Honey, I feel so much better!". Denies SOB at present. In low fowlers position, NAD.   Objective Vitals:   08/12/16 2059 08/12/16 2130 08/13/16 0204 08/13/16 0441  BP: 99/62 93/62  98/69  Pulse: 86 87  76  Resp: 18 16  16   Temp: 98.6 F (37 C)   97.4 F (36.3 C)  TempSrc: Oral   Oral  SpO2: 97% 94%  97%  Weight: 84.5 kg (186 lb 4.8 oz)  84.5 kg (186 lb 4.6 oz)   Height:       Physical Exam General: Very pleasant elderly obese female in NAD Heart: S1,S2, +S4 RRR. Trace edema UE. Slight JVD @ 30 degrees.  Lungs: sl decreased in bases otherwise CTA Abdomen: active BS. Edema noted pannus. Extremities: 1+ pitting BLE from hips to ankles. Sacral edema present.  Dialysis Access: R[facility] CDI.    Additional Objective Labs: Basic Metabolic Panel:  Recent Labs Lab 08/09/16 0021 08/10/16 0449 08/11/16 0718  NA 134*  133* 134* 131*  K 3.1*  3.1* 4.1 3.6  CL 96*  96* 94* 93*  CO2 25  24 25 26   GLUCOSE 152*  151* 109* 150*  BUN 15  17 16 14   CREATININE 2.61*  2.59* 2.62* 2.45*  CALCIUM 8.7*  8.7* 8.6* 8.7*  PHOS 1.7* 2.0* 1.8*   Liver Function Tests:  Recent Labs Lab 08/09/16 0021 08/10/16 0449 08/11/16 0718  AST 30  30  --   --   ALT 15  15  --   --   ALKPHOS 134*  127*  --   --   BILITOT 1.1  1.0  --   --   PROT 6.7  6.8  --   --   ALBUMIN 2.9*  2.9*  2.9* 2.8* 2.7*   No results for input(s): LIPASE, AMYLASE in the last 168 hours. CBC:  Recent Labs Lab 08/08/16 1220 08/09/16 0021 08/10/16 0449 08/11/16 0718  WBC 6.9 6.3 5.0 6.5  HGB 12.2 10.4* 10.4* 10.3*  HCT 37.7 30.9* 32.3* 31.4*  MCV 83.0 80.1 83.0 82.0  PLT 288 176 161 165   Blood Culture    Component Value Date/Time   SDES FLUID PERITONEAL 11/15/2015 1207   SDES FLUID PERITONEAL 11/15/2015 1207   [place] 11/15/2015 1207   SPECREQUEST NONE 11/15/2015 1207   CULT  11/15/2015 1207    NO GROWTH 5  DAYS Performed at [place] 11/20/2015 FINAL 11/15/2015 1207   REPTSTATUS 11/15/2015 FINAL 11/15/2015 1207    Cardiac Enzymes: No results for input(s): CKTOTAL, CKMB, CKMBINDEX, TROPONINI in the last 168 hours. CBG:  Recent Labs Lab 08/09/16 0738 08/10/16 0733 08/11/16 1147 08/12/16 0759 08/13/16 0724  GLUCAP 115* 115* 97 140* 124*   Iron Studies: No results for input(s): IRON, TIBC, TRANSFERRIN, FERRITIN in the last 72 hours. @lablastinr3 @ Studies/Results: No results found. Medications: . sodium chloride     . apixaban  2.5 mg Oral BID  . feeding supplement (NEPRO CARB STEADY)  237 mL Oral BID BM  . levothyroxine  100 mcg Oral QAC breakfast  . midodrine  10 mg Oral TID WC  . midodrine  5 mg Oral Q M,W,F-HD  . multivitamin  1 tablet Oral QHS  . sodium chloride flush  3 mL Intravenous Q12H  . sodium chloride flush  3 mL Intravenous Q12H   Dialysis Orders: [place] MWF 4 hrs 200/500 EDW 79.5 kg 2.0  K/2.0 Ca Linear Na -No heparin -Hectorol 3 mcg IV TIW -Venofer 50 mg IV TIW Last labs: HGB 10.3 08/01/16 Ferritin 548 Fe 103 Tsat 40 Ca 8.8 C Ca 9.2 Phos 7.8 PTH 752 (07/23/16).   Assessment/Plan: 1. Volume Overload/Pulmonary edema: Improving with serial HD but still more volume to be removed. For HD today.  2. ESRD -MWF HD today on schedule. Will recommend HD 4 days/weekly at OP. Recheck renal function with HD today.  3. Anemia - HGB 10.3. No ESA yet.  4. Secondary hyperparathyroidism - Phos 1.8. Holding binders for low phos. Recheck labs today.  5. HTN/volume - Patient receiving midodrine 10 mg PO 30 minutes prior to HD Midodrine 5 mg PO mid treatment. This needs to continue or she will not be able to tolerate volume removal. Nadir wt 78.8 kg. Pt wt 84.5 kg. Needs to observe fld restrictions.  6. Nutrition - Albumin 2.7 Renal/Carb mod diet/prostat/nepro.  7. H/O chronic HF-cont lowering volume 8. DM: Per primiary 9. Hypothyroidism: Per primary 10.  H/O AFib/Flutter. On apixiban.   Rita H. Brown NP-C 08/13/2016, 9:17 AM  [employer] [phone]

## 2016-08-13 NOTE — Care Management Important Message (Signed)
Important Message  Patient Details  Name: Wendy Kerr MRN: [id number] Date of Birth: 01/02/1931   Medicare Important Message Given:  Yes    [name] [address] 08/13/2016, 1:45 PM

## 2016-08-14 MED ORDER — MIDODRINE HCL 10 MG PO TABS: 10.0000 mg | ORAL_TABLET | Freq: Three times a day (TID) | ORAL | 0 refills | Status: DC

## 2016-08-14 MED ORDER — MIDODRINE HCL 5 MG PO TABS: 5.0000 mg | ORAL_TABLET | ORAL | 0 refills | Status: DC

## 2016-08-14 MED ORDER — CHOLESTYRAMINE LIGHT 4 G PO PACK: 4.0000 g | PACK | Freq: Once | ORAL | 0 refills | Status: DC

## 2016-08-14 MED ORDER — LEVOTHYROXINE SODIUM 100 MCG PO TABS: 100.0000 ug | ORAL_TABLET | Freq: Every day | ORAL | 0 refills | Status: DC

## 2016-08-14 NOTE — Progress Notes (Signed)
Brief Nutrition Note:   Provided pt with "Fluid Restricted Nutrition Therapy" and "Low Sodium Nutrition Therapy" handouts from the Academy of Nutrition and Dietetics.   Reviewed fluid restriction, what beverages and foods count as fluid and importance of adherence to fluid restriction  Reviewed sources of sodium, sodium-free seasonings, ways to reduce sodium in diet and importance of adhering to sodium restricted diet.  Encouraged pt and her son to follow-up with Registered Dietitian at outpatient dialysis center. Verbalized understanding  Pt being discharged today.  Kerman Passey MS, RD, LDN [phone] Pager  [phone] Weekend/On-Call Pager

## 2016-08-14 NOTE — Progress Notes (Signed)
[facility] Progress Note   Subjective:  Feeling much better today. Breathing improved  HD yesterday net UF 3.5L Tolerated HD with no complaints   Objective Vitals:   08/13/16 1707 08/13/16 2340 08/14/16 0644 08/14/16 0905  BP: (!) 103/52 (!) 86/59 (!) 86/51 96/63  Pulse: 80 86 88 94  Resp: 14 16 16 16   Temp: 97.8 F (36.6 C) 97.5 F (36.4 C) 98.5 F (36.9 C) 97.9 F (36.6 C)  TempSrc: Oral Oral Oral Oral  SpO2: 98% 98% 99% 100%  Weight: 76 kg (167 lb 8.8 oz) 76.4 kg (168 lb 6.9 oz)    Height:       Physical Exam General: Very pleasant elderly obese female in NAD Heart: S1,S2, +S4 RRR. Trace edema UE.  Lungs: sl decreased in bases otherwise CTA Abdomen: active BS. Edema noted pannus. Extremities: 1+ pitting BLE  Dialysis Access: RIJ [facility] CDI.    Additional Objective Labs: Basic Metabolic Panel:  Recent Labs Lab 08/10/16 0449 08/11/16 0718 08/13/16 1337  NA 134* 131* 130*  K 4.1 3.6 3.7  CL 94* 93* 93*  CO2 25 26 26   GLUCOSE 109* 150* 161*  BUN 16 14 21*  CREATININE 2.62* 2.45* 3.24*  CALCIUM 8.6* 8.7* 8.6*  PHOS 2.0* 1.8* 2.5   Liver Function Tests:  Recent Labs Lab 08/09/16 0021 08/10/16 0449 08/11/16 0718 08/13/16 1337  AST 30  30  --   --   --   ALT 15  15  --   --   --   ALKPHOS 134*  127*  --   --   --   BILITOT 1.1  1.0  --   --   --   PROT 6.7  6.8  --   --   --   ALBUMIN 2.9*  2.9*  2.9* 2.8* 2.7* 2.7*   No results for input(s): LIPASE, AMYLASE in the last 168 hours. CBC:  Recent Labs Lab 08/08/16 1220 08/09/16 0021 08/10/16 0449 08/11/16 0718 08/13/16 1336  WBC 6.9 6.3 5.0 6.5 7.9  HGB 12.2 10.4* 10.4* 10.3* 10.6*  HCT 37.7 30.9* 32.3* 31.4* 31.4*  MCV 83.0 80.1 83.0 82.0 81.6  PLT 288 176 161 165 208   Blood Culture    Component Value Date/Time   SDES FLUID PERITONEAL 11/15/2015 1207   SDES FLUID PERITONEAL 11/15/2015 1207   [place] 11/15/2015 1207   SPECREQUEST NONE 11/15/2015 1207    CULT  11/15/2015 1207    NO GROWTH 5 DAYS Performed at [facility] 11/20/2015 FINAL 11/15/2015 1207   REPTSTATUS 11/15/2015 FINAL 11/15/2015 1207    Cardiac Enzymes: No results for input(s): CKTOTAL, CKMB, CKMBINDEX, TROPONINI in the last 168 hours. CBG:  Recent Labs Lab 08/09/16 0738 08/10/16 0733 08/11/16 1147 08/12/16 0759 08/13/16 0724  GLUCAP 115* 115* 97 140* 124*   Iron Studies: No results for input(s): IRON, TIBC, TRANSFERRIN, FERRITIN in the last 72 hours. @lablastinr3 @ Studies/Results: No results found. Medications: . sodium chloride    . sodium chloride    . sodium chloride     . apixaban  2.5 mg Oral BID  . feeding supplement (NEPRO CARB STEADY)  237 mL Oral BID BM  . levothyroxine  100 mcg Oral QAC breakfast  . midodrine  10 mg Oral TID WC  . midodrine  5 mg Oral Q M,W,F-HD  . multivitamin  1 tablet Oral QHS  . sodium chloride flush  3 mL Intravenous Q12H  .  sodium chloride flush  3 mL Intravenous Q12H   Dialysis Orders: [place] MWF 4 hrs 200/500 EDW 79.5 kg 2.0 K/2.0 Ca Linear Na -No heparin -Hectorol 3 mcg IV TIW -Venofer 50 mg IV TIW Last labs: HGB 10.3 08/01/16 Ferritin 548 Fe 103 Tsat 40 Ca 8.8 C Ca 9.2 Phos 7.8 PTH 752 (07/23/16).   Assessment/Plan: 1. Volume Overload/Pulmonary edema: Improving with serial HD but still more volume to be removed.  2. ESRD -MWF HD today on schedule. Will recommend HD 4 days/weekly at OP.  3. Anemia - HGB 10.3. No ESA yet.  4. Secondary hyperparathyroidism - Phos 1.8. Holding binders for low phos.  5. HTN/volume - Patient receiving midodrine 10 mg PO 30 minutes prior to HD Midodrine 5 mg PO mid treatment. This needs to continue or she will not be able to tolerate volume removal. Nadir wt 78.8 kg. Needs to observe fld restrictions. Post HD wt 76kg net UF 3.5L on 6/18  6. Nutrition - Albumin 2.7 Renal/Carb mod diet/prostat/nepro.  7. H/O chronic HF-cont lowering volume 8. DM: Per primiary 9.  Hypothyroidism: Per primary 10. H/O AFib/Flutter. On apixiban.   Lynnda Child PA-C [facility] Pager [phone] 08/14/2016,9:52 AM

## 2016-08-14 NOTE — Progress Notes (Signed)
Malachy Mood to be D/C'd Home per MD order.  Discussed prescriptions and follow up appointments with the patient. Prescriptions given to patient, medication list explained in detail. Pt verbalized understanding.  Allergies as of 08/14/2016      Reactions   Piroxicam Other (See Comments)   REACTION: HEADACHE      Medication List    STOP taking these medications   hydrALAZINE 25 MG tablet Commonly known as:  APRESOLINE   isosorbide mononitrate 60 MG 24 hr tablet Commonly known as:  IMDUR   torsemide 20 MG tablet Commonly known as:  DEMADEX     TAKE these medications   atorvastatin 20 MG tablet Commonly known as:  LIPITOR Take 1 tablet (20 mg total) by mouth daily at 6 PM.   cholestyramine light 4 g packet Commonly known as:  PREVALITE Take 1 packet (4 g total) by mouth once.   ELIQUIS 2.5 MG Tabs tablet Generic drug:  apixaban TAKE 1 TABLET BY MOUTH 2 TIMES DAILY. What changed:  See the new instructions.   hydrOXYzine 25 MG tablet Commonly known as:  ATARAX/VISTARIL TAKE 1 TABLET (25 MG TOTAL) BY MOUTH 3 (THREE) TIMES DAILY AS NEEDED. What changed:  reasons to take this   IRON PO Take 1 tablet by mouth daily.   levothyroxine 100 MCG tablet Commonly known as:  SYNTHROID, LEVOTHROID Take 1 tablet (100 mcg total) by mouth daily before breakfast. Start taking on:  08/15/2016 What changed:  medication strength  how much to take   midodrine 10 MG tablet Commonly known as:  PROAMATINE Take 1 tablet (10 mg total) by mouth 3 (three) times daily with meals. What changed:  You were already taking a medication with the same name, and this prescription was added. Make sure you understand how and when to take each.   midodrine 5 MG tablet Commonly known as:  PROAMATINE Take 1 tablet (5 mg total) by mouth every Monday, Wednesday, and Friday with hemodialysis. Start taking on:  08/15/2016 What changed:  medication strength  how much to take  when to take this    mirtazapine 15 MG tablet Commonly known as:  REMERON Take 15 mg by mouth at bedtime as needed (for itching or sleep).   multivitamin with minerals Tabs tablet Take 1 tablet by mouth every morning.   nitroGLYCERIN 0.4 MG SL tablet Commonly known as:  NITROSTAT Place 1 tablet (0.4 mg total) under the tongue every 5 (five) minutes as needed for chest pain.   oxyCODONE-acetaminophen 5-325 MG tablet Commonly known as:  ROXICET Take 1 tablet by mouth every 4 (four) hours as needed. What changed:  reasons to take this   pantoprazole 40 MG tablet Commonly known as:  PROTONIX Take 40 mg by mouth 2 (two) times daily as needed (for acid reflux).   promethazine 12.5 MG tablet Commonly known as:  PHENERGAN Take 12.5 mg by mouth every 6 (six) hours as needed for nausea/vomiting.   Vitamin D (Ergocalciferol) 50000 units Caps capsule Commonly known as:  DRISDOL Take 1 capsule (50,000 Units total) by mouth every 7 (seven) days. What changed:  when to take this            Durable Medical Equipment        Start     Ordered   08/14/16 1550  For home use only DME Walker rolling  Once    Comments:  Pt for d/c today.  Question:  Patient needs a walker to treat with the  following condition  Answer:  Weakness generalized   08/14/16 1551   08/10/16 1609  For home use only DME 3 n 1  Once    Comments:  For home use , d/c date unknown   08/10/16 1610   08/10/16 1609  For home use only DME Tub bench  Once    Comments:  For home use d/c date unknown.   08/10/16 1610      Vitals:   08/14/16 0644 08/14/16 0905  BP: (!) 86/51 96/63  Pulse: 88 94  Resp: 16 16  Temp: 98.5 F (36.9 C) 97.9 F (36.6 C)    Skin clean, dry and intact without evidence of skin break down, no evidence of skin tears noted. IV catheter discontinued intact. Site without signs and symptoms of complications. Dressing and pressure applied. Pt denies pain at this time. No complaints noted.  An After Visit Summary  was printed and given to the patient. Patient escorted via [place], and D/C home via private auto.  Emilio Math, RN [facility] Phone [phone]

## 2016-08-14 NOTE — Care Management Note (Signed)
Case Management Note  Patient Details  Name: Wendy Kerr MRN: [id number] Date of Birth: March 21, 1930  Subjective/Objective:      CM following for progression and d/c planning.               Action/Plan: 08/14/2016 Spoke with pt son, Dollene Primrose [facility] needs. DME has been delivered to room. Pt active with [facility] for HHPT, this services has been resumed and HHRN and [place] aide has been added, [facility] aware of services now needed.   Expected Discharge Date:                  Expected Discharge Plan:  [place]  In-House Referral:  NA  Discharge planning Services  CM Consult  Post Acute Care Choice:  Durable Medical Equipment, Home Health Choice offered to:  Adult Children  DME Arranged:  3-N-1, Tub bench DME Agency:  [place] Arranged:  RN, PT, OT, Nurse's Aide [facility] Agency:  [place]  Status of Service:  In process, will continue to follow  If discussed at Long Length of Stay Meetings, dates discussed:    Additional Comments:  Adron Bene, RN 08/14/2016, 10:57 AM

## 2016-08-15 DIAGNOSIS — N186 End stage renal disease: Secondary | ICD-10-CM | POA: Diagnosis not present

## 2016-08-15 DIAGNOSIS — N185 Chronic kidney disease, stage 5: Secondary | ICD-10-CM | POA: Diagnosis not present

## 2016-08-15 DIAGNOSIS — D472 Monoclonal gammopathy: Secondary | ICD-10-CM | POA: Diagnosis not present

## 2016-08-15 DIAGNOSIS — D509 Iron deficiency anemia, unspecified: Secondary | ICD-10-CM | POA: Diagnosis not present

## 2016-08-15 DIAGNOSIS — N2581 Secondary hyperparathyroidism of renal origin: Secondary | ICD-10-CM | POA: Diagnosis not present

## 2016-08-16 DIAGNOSIS — I4510 Unspecified right bundle-branch block: Secondary | ICD-10-CM | POA: Diagnosis not present

## 2016-08-16 DIAGNOSIS — G4733 Obstructive sleep apnea (adult) (pediatric): Secondary | ICD-10-CM | POA: Diagnosis not present

## 2016-08-16 DIAGNOSIS — E1122 Type 2 diabetes mellitus with diabetic chronic kidney disease: Secondary | ICD-10-CM | POA: Diagnosis not present

## 2016-08-16 DIAGNOSIS — N184 Chronic kidney disease, stage 4 (severe): Secondary | ICD-10-CM | POA: Diagnosis not present

## 2016-08-16 DIAGNOSIS — I509 Heart failure, unspecified: Secondary | ICD-10-CM | POA: Diagnosis not present

## 2016-08-16 DIAGNOSIS — D136 Benign neoplasm of pancreas: Secondary | ICD-10-CM | POA: Diagnosis not present

## 2016-08-16 DIAGNOSIS — D472 Monoclonal gammopathy: Secondary | ICD-10-CM | POA: Diagnosis not present

## 2016-08-16 DIAGNOSIS — I130 Hypertensive heart and chronic kidney disease with heart failure and stage 1 through stage 4 chronic kidney disease, or unspecified chronic kidney disease: Secondary | ICD-10-CM | POA: Diagnosis not present

## 2016-08-16 DIAGNOSIS — I4892 Unspecified atrial flutter: Secondary | ICD-10-CM | POA: Diagnosis not present

## 2016-08-16 DIAGNOSIS — D631 Anemia in chronic kidney disease: Secondary | ICD-10-CM | POA: Diagnosis not present

## 2016-08-16 DIAGNOSIS — Z7901 Long term (current) use of anticoagulants: Secondary | ICD-10-CM | POA: Diagnosis not present

## 2016-08-16 NOTE — Discharge Summary (Signed)
[facility] Discharge Summary  Patient name: Wendy Kerr Medical record number: [id number] Date of birth: Jan 20, 1931 Age: 83 y.o. Gender: female Date of Admission: 08/08/2016  Date of Discharge:08/14/2016 Admitting Physician: Lupita Dawn, MD  Primary Care Provider: System, Pcp Not In Consultants: Nephrology  Indication for Hospitalization: Shortness of Breath   Discharge Diagnoses/Problem List:  ESRD on HD Vit D Deficiency HTN HLD GERD  T2DM CHF Aflutter  Disposition: Home with Home Health  Discharge Condition: Stable  Discharge Exam:  General: Elderly lady laying in bed in NAD Eyes: arcus senilis present, bilateral cataracts, EOMI. PERRL. ENTM: moist mucous membranes. Poor dentition Cardiovascular: RRR, no MRG. JVD present.  Respiratory: bibasilar crackles, no increased work of breathing Gastrointestinal: distended, non-tender. pitting edema noted on abdomen. +BS. No obvious organomegaly. No fluid wave. MSK:  pitting edema  has improved Derm: no rashes or lesions noted Psych: appropriate mood and affect  Brief Hospital Course:  Wendy Kerr is a 83 y.o. female with a past medical history significant for ESRD on dialysis MWF, CHF(EF 40-45%), GAD, GERD, HTN, HLD, T2DM, anemia who was admitted dyspnea for volume overload. Patient recently started dialysis and have also been hypotensive during her HD session making it difficult to pull enough volume out. Patient had crackles on admission, pitting edema, distended abdomen consistent with volume overload. Patient also had initially elevated troponin but no EKG changes. Patient received serial dialysis, about 4-5 sessions while inpatient. She continue to be hypotensive and had her midodrine titrated up throughout her admission. Patient had an abdominal US which showed moderate ascites after she was found to have transaminitis likely secondary to hepatic congestion in the setting of her volume  overload. Cardiology consulted due to her elevated troponin on admission and a few runs of Vtach. Cardiology was not concern and felt elevation in troponin was secondary to volume overload and recommended outpatient follow up with Dr.Smith her cardiologist. Echocardiogram was stable from last one and showed an EF of 40-45%. with G2DD. Patient was never symptomatic during V tach episodes and has a history of AV block type 1 and a flutter. Transaminitis improved by day of discharge and nephrology recommended 4 HD sessions a week with midodrine. Patient continue to complain of pruritus and was also started on cholestyramine. On discharged day, respiratory status was much improved  though patient was not quite at dry weight. She will follow up with [facility] and will go home with home health.  Issues for Follow Up:  1. Patient started on midodrine for low BP while on HD, will need titration. 2. Patient started on cholestyramine for pruritus, titrate as needed 3. Follow up CMP to monitor transaminitis  Significant Procedures: Echocardiogram  Significant Labs and Imaging:   Recent Labs Lab 08/10/16 0449 08/11/16 0718 08/13/16 1336  WBC 5.0 6.5 7.9  HGB 10.4* 10.3* 10.6*  HCT 32.3* 31.4* 31.4*  PLT 161 165 208    Recent Labs Lab 08/09/16 0021 08/10/16 0449 08/11/16 0718 08/11/16 1200 08/13/16 1337  NA 134*  133* 134* 131*  --  130*  K 3.1*  3.1* 4.1 3.6  --  3.7  CL 96*  96* 94* 93*  --  93*  CO2 25  24 25 26   --  26  GLUCOSE 152*  151* 109* 150*  --  161*  BUN 15  17 16 14   --  21*  CREATININE 2.61*  2.59* 2.62* 2.45*  --  3.24*  CALCIUM 8.7*  8.7* 8.6* 8.7*  --  8.6*  MG  --   --   --  2.0  --   PHOS 1.7* 2.0* 1.8*  --  2.5  ALKPHOS 134*  127*  --   --   --   --   AST 30  30  --   --   --   --   ALT 15  15  --   --   --   --   ALBUMIN 2.9*  2.9*  2.9* 2.8* 2.7*  --  2.7*   Dg Chest 2 View  Result Date: 08/08/2016 CLINICAL DATA:  Hypotension during the past  to dialysis treatments limiting removal of fluid. The patient for shortness of breath and swelling today. History of CHF. EXAM: CHEST  2 VIEW COMPARISON:  Portable chest x-ray of Jul 20, 2016 FINDINGS: The lungs are well-expanded. The interstitial markings are mildly prominent as compared to the previous study. There are small bilateral pleural effusions greatest on the left. The cardiac silhouette remains enlarged. The pulmonary vascularity is more engorged today. There is a dual-lumen dialysis catheter in place with the tip over the distal third of the SVC. There is calcification in the wall of the aortic arch. The observed bony thorax is unremarkable. IMPRESSION: CHF manifested by cardiomegaly and small bilateral pleural effusions. Only mild interstitial edema is observed. Electronically Signed   By: David  Martinique M.D.   On: 08/08/2016 12:43   US Abdomen Limited  Result Date: 08/09/2016 CLINICAL DATA:  Abdominal distension, evaluate for ascites. EXAM: LIMITED ABDOMEN ULTRASOUND FOR ASCITES TECHNIQUE: Limited ultrasound survey for ascites was performed in all four abdominal quadrants. COMPARISON:  Abdominal ultrasound of Jul 05, 2016 FINDINGS: There is a moderate amount of ascites observed in all 4 quadrants. The greatest volume appears to lie in the left lower quadrant. IMPRESSION: There is a moderate amount of ascites noted throughout the abdomen. Electronically Signed   By: David  Martinique M.D.   On: 08/09/2016 11:20   Results/Tests Pending at Time of Discharge: None  Discharge Medications:  Allergies as of 08/14/2016      Reactions   Piroxicam Other (See Comments)   REACTION: HEADACHE      Medication List    STOP taking these medications   hydrALAZINE 25 MG tablet Commonly known as:  APRESOLINE   isosorbide mononitrate 60 MG 24 hr tablet Commonly known as:  IMDUR   torsemide 20 MG tablet Commonly known as:  DEMADEX     TAKE these medications   atorvastatin 20 MG tablet Commonly known  as:  LIPITOR Take 1 tablet (20 mg total) by mouth daily at 6 PM.   cholestyramine light 4 g packet Commonly known as:  PREVALITE Take 1 packet (4 g total) by mouth once.   ELIQUIS 2.5 MG Tabs tablet Generic drug:  apixaban TAKE 1 TABLET BY MOUTH 2 TIMES DAILY. What changed:  See the new instructions.   hydrOXYzine 25 MG tablet Commonly known as:  ATARAX/VISTARIL TAKE 1 TABLET (25 MG TOTAL) BY MOUTH 3 (THREE) TIMES DAILY AS NEEDED. What changed:  reasons to take this   IRON PO Take 1 tablet by mouth daily.   levothyroxine 100 MCG tablet Commonly known as:  SYNTHROID, LEVOTHROID Take 1 tablet (100 mcg total) by mouth daily before breakfast. What changed:  medication strength  how much to take   midodrine 10 MG tablet Commonly known as:  PROAMATINE Take 1 tablet (10 mg total) by mouth 3 (three)  times daily with meals. What changed:  You were already taking a medication with the same name, and this prescription was added. Make sure you understand how and when to take each.   midodrine 5 MG tablet Commonly known as:  PROAMATINE Take 1 tablet (5 mg total) by mouth every Monday, Wednesday, and Friday with hemodialysis. What changed:  medication strength  how much to take  when to take this   mirtazapine 15 MG tablet Commonly known as:  REMERON Take 15 mg by mouth at bedtime as needed (for itching or sleep).   multivitamin with minerals Tabs tablet Take 1 tablet by mouth every morning.   nitroGLYCERIN 0.4 MG SL tablet Commonly known as:  NITROSTAT Place 1 tablet (0.4 mg total) under the tongue every 5 (five) minutes as needed for chest pain.   oxyCODONE-acetaminophen 5-325 MG tablet Commonly known as:  ROXICET Take 1 tablet by mouth every 4 (four) hours as needed. What changed:  reasons to take this   pantoprazole 40 MG tablet Commonly known as:  PROTONIX Take 40 mg by mouth 2 (two) times daily as needed (for acid reflux).   promethazine 12.5 MG  tablet Commonly known as:  PHENERGAN Take 12.5 mg by mouth every 6 (six) hours as needed for nausea/vomiting.   Vitamin D (Ergocalciferol) 50000 units Caps capsule Commonly known as:  DRISDOL Take 1 capsule (50,000 Units total) by mouth every 7 (seven) days. What changed:  when to take this       Discharge Instructions: Please refer to Patient Instructions section of EMR for full details.  Patient was counseled important signs and symptoms that should prompt return to medical care, changes in medications, dietary instructions, activity restrictions, and follow up appointments.   Follow-Up Appointments: Patient will make follow up appointment with PCP, Cardiology and Nephrology.  Marjie Skiff, MD 08/16/2016, 12:12 AM PGY-1, [place]

## 2016-08-17 DIAGNOSIS — D472 Monoclonal gammopathy: Secondary | ICD-10-CM | POA: Diagnosis not present

## 2016-08-17 DIAGNOSIS — N2581 Secondary hyperparathyroidism of renal origin: Secondary | ICD-10-CM | POA: Diagnosis not present

## 2016-08-17 DIAGNOSIS — D509 Iron deficiency anemia, unspecified: Secondary | ICD-10-CM | POA: Diagnosis not present

## 2016-08-17 DIAGNOSIS — N185 Chronic kidney disease, stage 5: Secondary | ICD-10-CM | POA: Diagnosis not present

## 2016-08-17 DIAGNOSIS — N186 End stage renal disease: Secondary | ICD-10-CM | POA: Diagnosis not present

## 2016-08-18 DIAGNOSIS — N2581 Secondary hyperparathyroidism of renal origin: Secondary | ICD-10-CM | POA: Diagnosis not present

## 2016-08-18 DIAGNOSIS — E8779 Other fluid overload: Secondary | ICD-10-CM | POA: Diagnosis not present

## 2016-08-18 DIAGNOSIS — N186 End stage renal disease: Secondary | ICD-10-CM | POA: Diagnosis not present

## 2016-08-18 DIAGNOSIS — R0602 Shortness of breath: Secondary | ICD-10-CM | POA: Diagnosis not present

## 2016-08-18 DIAGNOSIS — D472 Monoclonal gammopathy: Secondary | ICD-10-CM | POA: Diagnosis not present

## 2016-08-20 ENCOUNTER — Ambulatory Visit (HOSPITAL_COMMUNITY): Payer: Medicare Other | Attending: Vascular Surgery

## 2016-08-20 ENCOUNTER — Encounter (HOSPITAL_COMMUNITY): Payer: Medicare Other

## 2016-08-20 ENCOUNTER — Other Ambulatory Visit (HOSPITAL_COMMUNITY): Payer: Medicare Other

## 2016-08-20 ENCOUNTER — Encounter: Payer: Medicare Other | Admitting: Surgery

## 2016-08-20 ENCOUNTER — Other Ambulatory Visit: Payer: Self-pay | Admitting: *Deleted

## 2016-08-20 DIAGNOSIS — D472 Monoclonal gammopathy: Secondary | ICD-10-CM | POA: Diagnosis not present

## 2016-08-20 DIAGNOSIS — N185 Chronic kidney disease, stage 5: Secondary | ICD-10-CM | POA: Diagnosis not present

## 2016-08-20 DIAGNOSIS — D509 Iron deficiency anemia, unspecified: Secondary | ICD-10-CM | POA: Diagnosis not present

## 2016-08-20 DIAGNOSIS — N2581 Secondary hyperparathyroidism of renal origin: Secondary | ICD-10-CM | POA: Diagnosis not present

## 2016-08-20 DIAGNOSIS — N186 End stage renal disease: Secondary | ICD-10-CM | POA: Diagnosis not present

## 2016-08-20 NOTE — Patient Outreach (Signed)
[place] [facility]) Care Management  08/20/2016  MADALIN HUGHART 11-02-1930 [id number]   Transition of Care Referral  Referral Date: 08/20/16 Referral Source: [facility] High Risk Date of Discharge: 08/14/16 Facility: [facility] Discharge Diagnosis: Anasarca, ESRD w/ Dialysis, Abdominal Distention Insurance:[employer] Medicare    Outreach attempt #1 to patient. No answer. RN CM left HIPAA compliant message along with contact info.    Plan: RNCM will contact patient within the next business day.   Lake Bells, RN, BSN, MHA/MSL, [place] Telephonic Care Manager Coordinator [facility] Direct Phone: [phone] Toll Free: [phone] Fax: [phone]

## 2016-08-21 ENCOUNTER — Other Ambulatory Visit: Payer: Self-pay | Admitting: *Deleted

## 2016-08-21 NOTE — Patient Outreach (Addendum)
[place] [facility]) Care Management  08/21/2016  Wendy Kerr [birth year] [id number]   Transition of Care Referral  Referral Date:08/20/16 Referral Source: [facility] High Risk Date of Discharge: 08/14/16 Facility: [facility] Discharge Diagnosis: Anasarca, ESRD with HD Insurance: [facility] Medicare  Outreach attempt # 2 spoke with patient's son regarding referral from [facility]. HIPAA verified with patient's son Leta Bucklin).  Social: Patient lives alone. Her family assist with "around the clock care, 24 hrs/day". She is independent/assist with ADLs. She dependent upon her son for transportation to all medical appointments.  According to the son, patient uses a RW and a CPAP. He needs to set up an appointment with the MD to restart the CPAP. Patient is active with [facility] (RN, PT/OT).  Conditions: Past Medical Hx. Cirrhosis, HTN, HLD, GERD, T2DM, CHF, Aflutter, ESRD on HD, OSA  Patient was recently discharged from [facility]. Hemodialysis was initiated during her most recent hospitalization. While on dialysis, she is having difficulty sustaining her blood pressure. Patient had an extra dialysis day on Saturday related to a low blood pressure on Friday. [name] reported, his mother has complications from cirrhosis of the liver. Per MD notes, patient has lost weight due to decreased appetite Son verbalized, the hospital doctor recommended for his mother to discharge to a SNF. [name] decided against the MD's recommendation. He verbalizing wanting to apply for Medicaid and CAPPS. [name] is requesting support from our staff that will be beneficial to his mother.   Medications: [name] reported, patient taking 7 medications per day. [name] stated, patient has difficulty affording her medications. Patient is taking her medications as prescribed. [name] had no questions regarding patient's medications.   Appointments: Patient's HD days are MWF.  Consent: [facility] services reviewed and discussed with [name].  Verbal consent given for services.   Plan: RN CM advised patient that [place] CM would follow up and contact patient within the next 10 days. RN CM will send [facility] SW referral for possible assistance with community resources. RN CM advised patient to contact RN CM for any needs or concerns.  Lake Bells, RN, BSN, MHA/MSL, [place] Telephonic Care Manager Coordinator [facility] Direct Phone: [phone] Toll Free: [phone] Fax: [phone]

## 2016-08-22 ENCOUNTER — Encounter: Payer: Medicare Other | Admitting: Vascular Surgery

## 2016-08-22 DIAGNOSIS — N2581 Secondary hyperparathyroidism of renal origin: Secondary | ICD-10-CM | POA: Diagnosis not present

## 2016-08-22 DIAGNOSIS — D472 Monoclonal gammopathy: Secondary | ICD-10-CM | POA: Diagnosis not present

## 2016-08-22 DIAGNOSIS — D509 Iron deficiency anemia, unspecified: Secondary | ICD-10-CM | POA: Diagnosis not present

## 2016-08-22 DIAGNOSIS — N185 Chronic kidney disease, stage 5: Secondary | ICD-10-CM | POA: Diagnosis not present

## 2016-08-22 DIAGNOSIS — N186 End stage renal disease: Secondary | ICD-10-CM | POA: Diagnosis not present

## 2016-08-23 DIAGNOSIS — R0602 Shortness of breath: Secondary | ICD-10-CM | POA: Diagnosis not present

## 2016-08-23 DIAGNOSIS — N2581 Secondary hyperparathyroidism of renal origin: Secondary | ICD-10-CM | POA: Diagnosis not present

## 2016-08-23 DIAGNOSIS — E8779 Other fluid overload: Secondary | ICD-10-CM | POA: Diagnosis not present

## 2016-08-23 DIAGNOSIS — D472 Monoclonal gammopathy: Secondary | ICD-10-CM | POA: Diagnosis not present

## 2016-08-23 DIAGNOSIS — N186 End stage renal disease: Secondary | ICD-10-CM | POA: Diagnosis not present

## 2016-08-24 DIAGNOSIS — N185 Chronic kidney disease, stage 5: Secondary | ICD-10-CM | POA: Diagnosis not present

## 2016-08-24 DIAGNOSIS — N2581 Secondary hyperparathyroidism of renal origin: Secondary | ICD-10-CM | POA: Diagnosis not present

## 2016-08-24 DIAGNOSIS — N186 End stage renal disease: Secondary | ICD-10-CM | POA: Diagnosis not present

## 2016-08-24 DIAGNOSIS — D509 Iron deficiency anemia, unspecified: Secondary | ICD-10-CM | POA: Diagnosis not present

## 2016-08-24 DIAGNOSIS — D472 Monoclonal gammopathy: Secondary | ICD-10-CM | POA: Diagnosis not present

## 2016-08-25 DIAGNOSIS — Z992 Dependence on renal dialysis: Secondary | ICD-10-CM | POA: Diagnosis not present

## 2016-08-25 DIAGNOSIS — N186 End stage renal disease: Secondary | ICD-10-CM | POA: Diagnosis not present

## 2016-08-27 ENCOUNTER — Encounter: Payer: Self-pay | Admitting: *Deleted

## 2016-08-27 DIAGNOSIS — D509 Iron deficiency anemia, unspecified: Secondary | ICD-10-CM | POA: Diagnosis not present

## 2016-08-27 DIAGNOSIS — D472 Monoclonal gammopathy: Secondary | ICD-10-CM | POA: Diagnosis not present

## 2016-08-27 DIAGNOSIS — N2581 Secondary hyperparathyroidism of renal origin: Secondary | ICD-10-CM | POA: Diagnosis not present

## 2016-08-27 DIAGNOSIS — N186 End stage renal disease: Secondary | ICD-10-CM | POA: Diagnosis not present

## 2016-08-27 DIAGNOSIS — N185 Chronic kidney disease, stage 5: Secondary | ICD-10-CM | POA: Diagnosis not present

## 2016-08-28 DIAGNOSIS — N184 Chronic kidney disease, stage 4 (severe): Secondary | ICD-10-CM | POA: Diagnosis not present

## 2016-08-28 DIAGNOSIS — I130 Hypertensive heart and chronic kidney disease with heart failure and stage 1 through stage 4 chronic kidney disease, or unspecified chronic kidney disease: Secondary | ICD-10-CM | POA: Diagnosis not present

## 2016-08-28 DIAGNOSIS — Z7901 Long term (current) use of anticoagulants: Secondary | ICD-10-CM | POA: Diagnosis not present

## 2016-08-28 DIAGNOSIS — I4510 Unspecified right bundle-branch block: Secondary | ICD-10-CM | POA: Diagnosis not present

## 2016-08-28 DIAGNOSIS — D472 Monoclonal gammopathy: Secondary | ICD-10-CM | POA: Diagnosis not present

## 2016-08-28 DIAGNOSIS — I4892 Unspecified atrial flutter: Secondary | ICD-10-CM | POA: Diagnosis not present

## 2016-08-28 DIAGNOSIS — I509 Heart failure, unspecified: Secondary | ICD-10-CM | POA: Diagnosis not present

## 2016-08-28 DIAGNOSIS — E1122 Type 2 diabetes mellitus with diabetic chronic kidney disease: Secondary | ICD-10-CM | POA: Diagnosis not present

## 2016-08-28 DIAGNOSIS — G4733 Obstructive sleep apnea (adult) (pediatric): Secondary | ICD-10-CM | POA: Diagnosis not present

## 2016-08-28 DIAGNOSIS — D136 Benign neoplasm of pancreas: Secondary | ICD-10-CM | POA: Diagnosis not present

## 2016-08-28 DIAGNOSIS — D631 Anemia in chronic kidney disease: Secondary | ICD-10-CM | POA: Diagnosis not present

## 2016-08-29 DIAGNOSIS — N186 End stage renal disease: Secondary | ICD-10-CM | POA: Diagnosis not present

## 2016-08-29 DIAGNOSIS — N2581 Secondary hyperparathyroidism of renal origin: Secondary | ICD-10-CM | POA: Diagnosis not present

## 2016-08-29 DIAGNOSIS — D472 Monoclonal gammopathy: Secondary | ICD-10-CM | POA: Diagnosis not present

## 2016-08-29 DIAGNOSIS — D509 Iron deficiency anemia, unspecified: Secondary | ICD-10-CM | POA: Diagnosis not present

## 2016-08-29 DIAGNOSIS — N185 Chronic kidney disease, stage 5: Secondary | ICD-10-CM | POA: Diagnosis not present

## 2016-08-30 ENCOUNTER — Other Ambulatory Visit: Payer: Self-pay | Admitting: *Deleted

## 2016-08-30 ENCOUNTER — Encounter: Payer: Self-pay | Admitting: *Deleted

## 2016-08-30 NOTE — Patient Outreach (Signed)
[place] [facility]) Care Management  [place]  08/30/2016   KAYLINA CAHUE [birth year] [id number]   Encounter Medications:  Outpatient Encounter Prescriptions as of 08/30/2016  Medication Sig  . atorvastatin (LIPITOR) 20 MG tablet Take 1 tablet (20 mg total) by mouth daily at 6 PM. (Patient not taking: Reported on 08/09/2016)  . cholestyramine light (PREVALITE) 4 g packet Take 1 packet (4 g total) by mouth once.  Marland Kitchen ELIQUIS 2.5 MG TABS tablet TAKE 1 TABLET BY MOUTH 2 TIMES DAILY. (Patient taking differently: TAKE 2.5 MG BY MOUTH 2 TIMES DAILY.)  . hydrOXYzine (ATARAX/VISTARIL) 25 MG tablet TAKE 1 TABLET (25 MG TOTAL) BY MOUTH 3 (THREE) TIMES DAILY AS NEEDED. (Patient taking differently: Take 25 mg by mouth 3 (three) times daily as needed for anxiety or itching. )  . IRON PO Take 1 tablet by mouth daily.   Marland Kitchen levothyroxine (SYNTHROID, LEVOTHROID) 100 MCG tablet Take 1 tablet (100 mcg total) by mouth daily before breakfast.  . midodrine (PROAMATINE) 10 MG tablet Take 1 tablet (10 mg total) by mouth 3 (three) times daily with meals.  . midodrine (PROAMATINE) 5 MG tablet Take 1 tablet (5 mg total) by mouth every Monday, Wednesday, and Friday with hemodialysis.  Marland Kitchen mirtazapine (REMERON) 15 MG tablet Take 15 mg by mouth at bedtime as needed (for itching or sleep).   . Multiple Vitamin (MULTIVITAMIN WITH MINERALS) TABS tablet Take 1 tablet by mouth every morning.  . nitroGLYCERIN (NITROSTAT) 0.4 MG SL tablet Place 1 tablet (0.4 mg total) under the tongue every 5 (five) minutes as needed for chest pain.  Marland Kitchen oxyCODONE-acetaminophen (ROXICET) 5-325 MG tablet Take 1 tablet by mouth every 4 (four) hours as needed. (Patient taking differently: Take 1 tablet by mouth every 4 (four) hours as needed for moderate pain. )  . pantoprazole (PROTONIX) 40 MG tablet Take 40 mg by mouth 2 (two) times daily as needed (for acid reflux).   . promethazine (PHENERGAN) 12.5 MG tablet Take 12.5 mg by mouth  every 6 (six) hours as needed for nausea/vomiting.  . Vitamin D, Ergocalciferol, (DRISDOL) 50000 units CAPS capsule Take 1 capsule (50,000 Units total) by mouth every 7 (seven) days. (Patient taking differently: Take 50,000 Units by mouth every Monday. )   No facility-administered encounter medications on file as of 08/30/2016.     Functional Status:  In your present state of health, do you have any difficulty performing the following activities: 08/30/2016 08/08/2016  Hearing? N N  Vision? Y Y  Difficulty concentrating or making decisions? N N  Walking or climbing stairs? Y Y  Dressing or bathing? N N  Doing errands, shopping? Y Y  Some recent data might be hidden    Fall/Depression Screening: Fall Risk  08/30/2016 08/21/2016 02/11/2015  Falls in the past year? No No No  Risk for fall due to : - History of fall(s) -   PHQ 2/9 Scores 08/30/2016 08/21/2016 02/11/2015 07/30/2012  PHQ - 2 Score 0 - 0 0  Exception Documentation - Other- indicate reason in comment box - -  Not completed - Unable to assess, spoke with patient's son. - -    Assessment:   CSW received referral from [place], [place] for assistance with community resources/Medicaid on 08/22/16. CSW met with patient & son, [name] in patient's home today to complete initial home visit & assessment. Patient's son is very supportive and involved in patient's care - transports her to dialysis every Monday, Wednesday & Friday  and to all her doctor's appointments. Patient has recently started dialysis 5 weeks ago - goes to dialysis on [address] they are working with the Education officer, museum, Aniceto Boss there to get moved to the dialysis closer to her home. Patient was admitted to the hospital 2 weeks ago for fluid overload. Patient's son provided with Medicaid application and encouraged to take to DSS to meet with a caseworker. Patient also requested information on ramp for her home & possible home improvements as she will likely need to  transition from walker to wheelchair in near future. CSW provided son with information on [facility] [place] office: [phone]. Son states that he will call them & complete application for Medicaid and turn it in to DSS this week. Son also states that he has Advance Directives already completed as well and will try to find them to have scanned into Epic system.     Plan:   CSW will follow-up in 3 weeks to ensure that Medicaid application has been submitted.   [facility] CM Care Plan Problem One     Most Recent Value  Care Plan Problem One  Patient with financial limitations and in need of community resources.  Role Documenting the Problem One  Clinical Social Worker  Care Plan for Problem One  Active  [facility] Long Term Goal   Patient will apply for Medicaid and be linked with community resources as identified.  [facility] Long Term Goal Start Date  08/30/16  Interventions for Problem One Long Term Goal  CSW providing and assisting with Medicaid application and other community links.   [facility] CM Short Term Goal #1   Patient will complete Medicaid application and collect all outstanding medical bills within the next 30 days.  [facility] CM Short Term Goal #1 Start Date  08/30/16  Interventions for Short Term Goal #1  CSW provided and assisted with Medicaid application, encouraged to go to DSS to turn it in & meet with caseworker,   [facility] CM Short Term Goal #2    Patient will receive community resource information and pursue services as desired.  [facility] CM Short Term Goal #2 Start Date  08/30/16  Interventions for Short Term Goal #2   CSW discussed and provided patient with Advance Directives, ramp building support.         Raynaldo Opitz, LCSW [facility]  Clinical Social Worker cell #: [phone]

## 2016-08-31 ENCOUNTER — Other Ambulatory Visit: Payer: Self-pay

## 2016-08-31 DIAGNOSIS — N186 End stage renal disease: Secondary | ICD-10-CM | POA: Diagnosis not present

## 2016-08-31 DIAGNOSIS — D472 Monoclonal gammopathy: Secondary | ICD-10-CM | POA: Diagnosis not present

## 2016-08-31 DIAGNOSIS — D509 Iron deficiency anemia, unspecified: Secondary | ICD-10-CM | POA: Diagnosis not present

## 2016-08-31 DIAGNOSIS — N2581 Secondary hyperparathyroidism of renal origin: Secondary | ICD-10-CM | POA: Diagnosis not present

## 2016-08-31 DIAGNOSIS — N185 Chronic kidney disease, stage 5: Secondary | ICD-10-CM | POA: Diagnosis not present

## 2016-08-31 NOTE — Patient Outreach (Signed)
[facility]) Care Management  08/31/16  KANITA DELAGE 01-Nov-1930 [id number]  RNCM received referral from telephonic CM on 08/22/2016 for home assessment. Patient with hew hemodialysis, complications with liver disease, cirrhosis, decreased appetite and weight loss.  Attempted to reach patient without success. Phone rang multiple times with no answer and no option to leave a voicemail.  [place] R. Damiean Lukes, RN, BSN, [place] Management Coordinator [phone]

## 2016-09-01 DIAGNOSIS — N185 Chronic kidney disease, stage 5: Secondary | ICD-10-CM | POA: Diagnosis not present

## 2016-09-01 DIAGNOSIS — N186 End stage renal disease: Secondary | ICD-10-CM | POA: Diagnosis not present

## 2016-09-01 DIAGNOSIS — N2581 Secondary hyperparathyroidism of renal origin: Secondary | ICD-10-CM | POA: Diagnosis not present

## 2016-09-01 DIAGNOSIS — D509 Iron deficiency anemia, unspecified: Secondary | ICD-10-CM | POA: Diagnosis not present

## 2016-09-01 DIAGNOSIS — D472 Monoclonal gammopathy: Secondary | ICD-10-CM | POA: Diagnosis not present

## 2016-09-02 NOTE — Progress Notes (Deleted)
Cardiology Office Note    Date:  09/02/2016   ID:  Uriah, Trueba 1930/06/08, MRN [id number]  PCP:  Hoyt Koch, MD  Cardiologist: Sinclair Grooms, MD   No chief complaint on file.   History of Present Illness:  Wendy Kerr is a 83 y.o. female chronic anemia, chronic kidney disease stage III IV, hypertension, suspected underlying coronary artery disease, type 2 diabetes mellitus, Elevated TSH, obstructive sleep apnea, relatively recent small bowel obstruction and adrenal mass and presumed benign pancreatic lesion noted on CT.    Past Medical History:  Diagnosis Date  . 1st degree AV block   . Anemia   . Atrial flutter ([name])   . Benign neoplasm of pancreas, except islets of Langerhans   . Bronchitis, mucopurulent recurrent ([place])   . Cataract    B/L  . CHF (congestive heart failure) ([place])   . Chronic kidney disease   . Cirrhosis of liver ([place])    " just a little"  . Common migraine   . Diabetes mellitus ([place])   . Diverticulosis of colon   . DJD (degenerative joint disease)   . Generalized anxiety disorder   . GERD (gastroesophageal reflux disease)   . Hypercholesterolemia   . Hypertension   . Monoclonal gammopathy   . Obesity   . OSA (obstructive sleep apnea)    severe with AHI 37 events per hour  . Vitamin D deficiency     Past Surgical History:  Procedure Laterality Date  . ABDOMINAL HYSTERECTOMY  1972  . AV FISTULA PLACEMENT Right 07/13/2016   Procedure: Creation Arteriovenous Fistula Right Arm;  Surgeon: Angelia Mould, MD;  Location: [place];  Service: Vascular;  Laterality: Right;  . COLONOSCOPY    . INSERTION OF DIALYSIS CATHETER Right 07/20/2016   Procedure: INSERTION OF TUNNELED DIALYSIS CATHETER - RIGHT INTERNAL JUGULAR;  Surgeon: Angelia Mould, MD;  Location: [facility];  Service: Vascular;  Laterality: Right;  . LAPAROTOMY N/A 12/23/2015   Procedure: EXPLORATORY LAPAROTOMY FOR SMALL BOWEL OBSTRUCTION;  Surgeon: Judeth Horn, MD;  Location: [place] [facility];  Service: General;  Laterality: N/A;  . resection serous cystadenoma of pancreas  2004   at [facility].  Now (2017) with Dr Bridgett Larsson with [facility]    Current Medications: Outpatient Medications Prior to Visit  Medication Sig Dispense Refill  . atorvastatin (LIPITOR) 20 MG tablet Take 1 tablet (20 mg total) by mouth daily at 6 PM. (Patient not taking: Reported on 08/09/2016) 30 tablet 6  . cholestyramine light (PREVALITE) 4 g packet Take 1 packet (4 g total) by mouth once. 1 packet 0  . ELIQUIS 2.5 MG TABS tablet TAKE 1 TABLET BY MOUTH 2 TIMES DAILY. (Patient taking differently: TAKE 2.5 MG BY MOUTH 2 TIMES DAILY.) 60 tablet 11  . hydrOXYzine (ATARAX/VISTARIL) 25 MG tablet TAKE 1 TABLET (25 MG TOTAL) BY MOUTH 3 (THREE) TIMES DAILY AS NEEDED. (Patient taking differently: Take 25 mg by mouth 3 (three) times daily as needed for anxiety or itching. ) 90 tablet 0  . IRON PO Take 1 tablet by mouth daily.     Marland Kitchen levothyroxine (SYNTHROID, LEVOTHROID) 100 MCG tablet Take 1 tablet (100 mcg total) by mouth daily before breakfast. 30 tablet 0  . midodrine (PROAMATINE) 10 MG tablet Take 1 tablet (10 mg total) by mouth 3 (three) times daily with meals. 90 tablet 0  . midodrine (PROAMATINE) 5 MG tablet Take 1 tablet (5 mg total) by mouth every Monday,  Wednesday, and Friday with hemodialysis. 30 tablet 0  . mirtazapine (REMERON) 15 MG tablet Take 15 mg by mouth at bedtime as needed (for itching or sleep).   2  . Multiple Vitamin (MULTIVITAMIN WITH MINERALS) TABS tablet Take 1 tablet by mouth every morning.    . nitroGLYCERIN (NITROSTAT) 0.4 MG SL tablet Place 1 tablet (0.4 mg total) under the tongue every 5 (five) minutes as needed for chest pain. 5 tablet 3  . oxyCODONE-acetaminophen (ROXICET) 5-325 MG tablet Take 1 tablet by mouth every 4 (four) hours as needed. (Patient taking differently: Take 1 tablet by mouth every 4 (four) hours as needed for moderate pain. ) 10 tablet 0  . pantoprazole  (PROTONIX) 40 MG tablet Take 40 mg by mouth 2 (two) times daily as needed (for acid reflux).     . promethazine (PHENERGAN) 12.5 MG tablet Take 12.5 mg by mouth every 6 (six) hours as needed for nausea/vomiting.  0  . Vitamin D, Ergocalciferol, (DRISDOL) 50000 units CAPS capsule Take 1 capsule (50,000 Units total) by mouth every 7 (seven) days. (Patient taking differently: Take 50,000 Units by mouth every Monday. ) 12 capsule 3   No facility-administered medications prior to visit.      Allergies:   Piroxicam   Social History   Social History  . Marital status: Single    Spouse name: N/A  . Number of children: 2  . Years of education: N/A   Occupational History  . 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 Smoking status: Never Smoker  . Smokeless tobacco: Never Used  . Alcohol use No  . Drug use: No  . Sexual activity: Not on file   Other Topics Concern  . 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  Ambulates independently.     Family History:  The patient's ***family history includes Heart Problems (age of onset: 69) in her mother; Heart Problems (age of onset: 67) in her father; Hypertension in her father and mother; Kidney disease in her other and sister; Kidney failure in her father and mother.   ROS:   Please see the history of present illness.    ***  All other systems reviewed and are negative.   PHYSICAL EXAM:   VS:  There were no vitals taken for this visit.   GEN: Well nourished, well developed, in no acute distress  HEENT: normal  Neck: no JVD, carotid bruits, or masses Cardiac: ***RRR; no murmurs, rubs, or gallops,no edema  Respiratory:  clear to auscultation bilaterally, normal work of breathing GI: soft, nontender, nondistended, + BS MS: no deformity or atrophy  Skin: warm and dry, no rash Neuro:  Alert and Oriented x 3, Strength and sensation are  intact Psych: euthymic mood, full affect  Wt Readings from Last 3 Encounters:  08/13/16 168 lb 6.9 oz (76.4 kg)  07/13/16 163 lb (73.9 kg)  07/11/16 163 lb (73.9 kg)      Studies/Labs Reviewed:   EKG:  EKG  ***  Recent Labs: 06/26/2016: Pro B Natriuretic peptide (BNP) 3,417.0 08/09/2016: ALT 15; ALT 15; B Natriuretic Peptide >4,500.0; TSH 8.460 08/11/2016: Magnesium 2.0 08/13/2016: BUN 21; Creatinine, Ser 3.24; Hemoglobin 10.6; Platelets 208; Potassium 3.7; Sodium 130   Lipid Panel    Component Value Date/Time   CHOL 185 07/30/2012 1052   TRIG 119.0 07/30/2012 1052   HDL  59.70 07/30/2012 1052   CHOLHDL 3 07/30/2012 1052   VLDL 23.8 07/30/2012 1052   LDLCALC 102 (H) 07/30/2012 1052   LDLDIRECT 142.5 08/11/2007 1021    Additional studies/ records that were reviewed today include:  ***    ASSESSMENT:    1. Chronic systolic heart failure ([place])   2. Atypical atrial flutter (H[facility])   3. Right bundle branch block   4. Essential hypertension   5. OSA (obstructive sleep apnea)   6. CKD stage 3 due to type 2 diabetes mellitus (H[facility])      PLAN:  In order of problems listed above:  1. ***    Medication Adjustments/Labs and Tests Ordered: Current medicines are reviewed at length with the patient today.  Concerns regarding medicines are outlined above.  Medication changes, Labs and Tests ordered today are listed in the Patient Instructions below. There are no Patient Instructions on file for this visit.   Signed, Sinclair Grooms, MD  09/02/2016 6:30 PM    [facility] [address], [place], [place]  [address] Phone: [phone]; Fax: [phone]

## 2016-09-03 ENCOUNTER — Ambulatory Visit: Payer: Medicare Other | Admitting: *Deleted

## 2016-09-03 DIAGNOSIS — D472 Monoclonal gammopathy: Secondary | ICD-10-CM | POA: Diagnosis not present

## 2016-09-03 DIAGNOSIS — N185 Chronic kidney disease, stage 5: Secondary | ICD-10-CM | POA: Diagnosis not present

## 2016-09-03 DIAGNOSIS — N186 End stage renal disease: Secondary | ICD-10-CM | POA: Diagnosis not present

## 2016-09-03 DIAGNOSIS — D509 Iron deficiency anemia, unspecified: Secondary | ICD-10-CM | POA: Diagnosis not present

## 2016-09-03 DIAGNOSIS — N2581 Secondary hyperparathyroidism of renal origin: Secondary | ICD-10-CM | POA: Diagnosis not present

## 2016-09-04 ENCOUNTER — Ambulatory Visit: Payer: Medicare Other | Admitting: Interventional Cardiology

## 2016-09-05 DIAGNOSIS — D509 Iron deficiency anemia, unspecified: Secondary | ICD-10-CM | POA: Diagnosis not present

## 2016-09-05 DIAGNOSIS — D472 Monoclonal gammopathy: Secondary | ICD-10-CM | POA: Diagnosis not present

## 2016-09-05 DIAGNOSIS — N186 End stage renal disease: Secondary | ICD-10-CM | POA: Diagnosis not present

## 2016-09-05 DIAGNOSIS — N2581 Secondary hyperparathyroidism of renal origin: Secondary | ICD-10-CM | POA: Diagnosis not present

## 2016-09-05 DIAGNOSIS — N185 Chronic kidney disease, stage 5: Secondary | ICD-10-CM | POA: Diagnosis not present

## 2016-09-06 DIAGNOSIS — D631 Anemia in chronic kidney disease: Secondary | ICD-10-CM | POA: Diagnosis not present

## 2016-09-06 DIAGNOSIS — I120 Hypertensive chronic kidney disease with stage 5 chronic kidney disease or end stage renal disease: Secondary | ICD-10-CM | POA: Diagnosis not present

## 2016-09-06 DIAGNOSIS — K7460 Unspecified cirrhosis of liver: Secondary | ICD-10-CM | POA: Diagnosis not present

## 2016-09-06 DIAGNOSIS — E039 Hypothyroidism, unspecified: Secondary | ICD-10-CM | POA: Diagnosis not present

## 2016-09-06 DIAGNOSIS — R05 Cough: Secondary | ICD-10-CM | POA: Diagnosis not present

## 2016-09-07 DIAGNOSIS — D472 Monoclonal gammopathy: Secondary | ICD-10-CM | POA: Diagnosis not present

## 2016-09-07 DIAGNOSIS — N185 Chronic kidney disease, stage 5: Secondary | ICD-10-CM | POA: Diagnosis not present

## 2016-09-07 DIAGNOSIS — D509 Iron deficiency anemia, unspecified: Secondary | ICD-10-CM | POA: Diagnosis not present

## 2016-09-07 DIAGNOSIS — N186 End stage renal disease: Secondary | ICD-10-CM | POA: Diagnosis not present

## 2016-09-07 DIAGNOSIS — N2581 Secondary hyperparathyroidism of renal origin: Secondary | ICD-10-CM | POA: Diagnosis not present

## 2016-09-08 DIAGNOSIS — D472 Monoclonal gammopathy: Secondary | ICD-10-CM | POA: Diagnosis not present

## 2016-09-08 DIAGNOSIS — N2581 Secondary hyperparathyroidism of renal origin: Secondary | ICD-10-CM | POA: Diagnosis not present

## 2016-09-08 DIAGNOSIS — N185 Chronic kidney disease, stage 5: Secondary | ICD-10-CM | POA: Diagnosis not present

## 2016-09-08 DIAGNOSIS — N186 End stage renal disease: Secondary | ICD-10-CM | POA: Diagnosis not present

## 2016-09-08 DIAGNOSIS — D509 Iron deficiency anemia, unspecified: Secondary | ICD-10-CM | POA: Diagnosis not present

## 2016-09-10 DIAGNOSIS — D472 Monoclonal gammopathy: Secondary | ICD-10-CM | POA: Diagnosis not present

## 2016-09-10 DIAGNOSIS — D509 Iron deficiency anemia, unspecified: Secondary | ICD-10-CM | POA: Diagnosis not present

## 2016-09-10 DIAGNOSIS — N186 End stage renal disease: Secondary | ICD-10-CM | POA: Diagnosis not present

## 2016-09-10 DIAGNOSIS — N185 Chronic kidney disease, stage 5: Secondary | ICD-10-CM | POA: Diagnosis not present

## 2016-09-10 DIAGNOSIS — N2581 Secondary hyperparathyroidism of renal origin: Secondary | ICD-10-CM | POA: Diagnosis not present

## 2016-09-12 DIAGNOSIS — D472 Monoclonal gammopathy: Secondary | ICD-10-CM | POA: Diagnosis not present

## 2016-09-12 DIAGNOSIS — N2581 Secondary hyperparathyroidism of renal origin: Secondary | ICD-10-CM | POA: Diagnosis not present

## 2016-09-12 DIAGNOSIS — N185 Chronic kidney disease, stage 5: Secondary | ICD-10-CM | POA: Diagnosis not present

## 2016-09-12 DIAGNOSIS — D509 Iron deficiency anemia, unspecified: Secondary | ICD-10-CM | POA: Diagnosis not present

## 2016-09-12 DIAGNOSIS — N186 End stage renal disease: Secondary | ICD-10-CM | POA: Diagnosis not present

## 2016-09-14 DIAGNOSIS — N186 End stage renal disease: Secondary | ICD-10-CM | POA: Diagnosis not present

## 2016-09-14 DIAGNOSIS — D509 Iron deficiency anemia, unspecified: Secondary | ICD-10-CM | POA: Diagnosis not present

## 2016-09-14 DIAGNOSIS — N2581 Secondary hyperparathyroidism of renal origin: Secondary | ICD-10-CM | POA: Diagnosis not present

## 2016-09-14 DIAGNOSIS — D472 Monoclonal gammopathy: Secondary | ICD-10-CM | POA: Diagnosis not present

## 2016-09-14 DIAGNOSIS — N185 Chronic kidney disease, stage 5: Secondary | ICD-10-CM | POA: Diagnosis not present

## 2016-09-15 ENCOUNTER — Other Ambulatory Visit: Payer: Self-pay | Admitting: Family Medicine

## 2016-09-15 DIAGNOSIS — D509 Iron deficiency anemia, unspecified: Secondary | ICD-10-CM | POA: Diagnosis not present

## 2016-09-15 DIAGNOSIS — N186 End stage renal disease: Secondary | ICD-10-CM | POA: Diagnosis not present

## 2016-09-15 DIAGNOSIS — N185 Chronic kidney disease, stage 5: Secondary | ICD-10-CM | POA: Diagnosis not present

## 2016-09-15 DIAGNOSIS — D472 Monoclonal gammopathy: Secondary | ICD-10-CM | POA: Diagnosis not present

## 2016-09-15 DIAGNOSIS — N2581 Secondary hyperparathyroidism of renal origin: Secondary | ICD-10-CM | POA: Diagnosis not present

## 2016-09-17 ENCOUNTER — Other Ambulatory Visit: Payer: Self-pay

## 2016-09-17 ENCOUNTER — Other Ambulatory Visit: Payer: Self-pay | Admitting: Family Medicine

## 2016-09-17 DIAGNOSIS — D509 Iron deficiency anemia, unspecified: Secondary | ICD-10-CM | POA: Diagnosis not present

## 2016-09-17 DIAGNOSIS — N185 Chronic kidney disease, stage 5: Secondary | ICD-10-CM | POA: Diagnosis not present

## 2016-09-17 DIAGNOSIS — D472 Monoclonal gammopathy: Secondary | ICD-10-CM | POA: Diagnosis not present

## 2016-09-17 DIAGNOSIS — N2581 Secondary hyperparathyroidism of renal origin: Secondary | ICD-10-CM | POA: Diagnosis not present

## 2016-09-17 DIAGNOSIS — N186 End stage renal disease: Secondary | ICD-10-CM | POA: Diagnosis not present

## 2016-09-17 NOTE — Patient Outreach (Signed)
[facility]) Care Management  09/17/16  TAIA BRAMLETT Feb 28, 1930 [id number]  Second attempt to reach patient without success. Phone rang multiple times with no answer and no option to leave a message. Upon review, patient has dialysis on Monday, Wednesdays and Fridays. Will attempt to reach patient on a a non-dialysis day.  [place] R. Sheridan Hew, RN, BSN, [place] Management Coordinator [phone]

## 2016-09-18 ENCOUNTER — Other Ambulatory Visit: Payer: Self-pay

## 2016-09-18 NOTE — Patient Outreach (Signed)
[facility]) Care Management  09/18/16  Wendy Kerr 1930-04-24 [id number]  Successful outreach completed with patient, however she stated she did not have any needs at present and would prefer RNCM speak with her son Anureet Bruington at [phone]. She stated he is best reached in the middle of the day. Patient did not take RNCM contact information before hanging up the phone.  Plan: RNCM will reach out to patient's son, Wendy Kerr tomorrow to assess for needs.  Wendy R. Singleton Hickox, Wendy Kerr, Wendy Kerr, [place] Management Coordinator [phone]

## 2016-09-19 ENCOUNTER — Other Ambulatory Visit: Payer: Self-pay

## 2016-09-19 DIAGNOSIS — D509 Iron deficiency anemia, unspecified: Secondary | ICD-10-CM | POA: Diagnosis not present

## 2016-09-19 DIAGNOSIS — D472 Monoclonal gammopathy: Secondary | ICD-10-CM | POA: Diagnosis not present

## 2016-09-19 DIAGNOSIS — N186 End stage renal disease: Secondary | ICD-10-CM | POA: Diagnosis not present

## 2016-09-19 DIAGNOSIS — N185 Chronic kidney disease, stage 5: Secondary | ICD-10-CM | POA: Diagnosis not present

## 2016-09-19 DIAGNOSIS — N2581 Secondary hyperparathyroidism of renal origin: Secondary | ICD-10-CM | POA: Diagnosis not present

## 2016-09-19 NOTE — Patient Outreach (Signed)
[facility]) Care Management  09/19/16  ERABELLA KUIPERS 11/08/1930 [id number]  Attempted to reach patient's son, Aminah Zabawa without success. Phone rang with no answer and RNCM unable to leave a voicemail due to mailbox being full.  Will make one more attempt to reach son later this week and send a letter if unsuccessful.  [place] R. Domanick Cuccia, RN, BSN, [place] Management Coordinator [phone]

## 2016-09-20 ENCOUNTER — Other Ambulatory Visit: Payer: Self-pay | Admitting: *Deleted

## 2016-09-21 ENCOUNTER — Ambulatory Visit: Payer: Self-pay

## 2016-09-21 DIAGNOSIS — D509 Iron deficiency anemia, unspecified: Secondary | ICD-10-CM | POA: Diagnosis not present

## 2016-09-21 DIAGNOSIS — D472 Monoclonal gammopathy: Secondary | ICD-10-CM | POA: Diagnosis not present

## 2016-09-21 DIAGNOSIS — N185 Chronic kidney disease, stage 5: Secondary | ICD-10-CM | POA: Diagnosis not present

## 2016-09-21 DIAGNOSIS — N2581 Secondary hyperparathyroidism of renal origin: Secondary | ICD-10-CM | POA: Diagnosis not present

## 2016-09-21 DIAGNOSIS — N186 End stage renal disease: Secondary | ICD-10-CM | POA: Diagnosis not present

## 2016-09-21 NOTE — Patient Outreach (Signed)
[facility]) Care Management  09/21/2016  Wendy Kerr 21-Jun-1930 [id number]   CSW called to follow-up with patient after initial home visit. Patient reports that she is doing well, but deferred all questions to her son, Wendy Kerr. CSW attempted to reach patient's son without success. Phone rang with no answer and CSW unable to leave a voicemail due to mailbox being full.  CSW will try back within 2 weeks to follow-up on resources.    Raynaldo Opitz, LCSW [facility]  Clinical Social Worker cell #: [phone]

## 2016-09-22 DIAGNOSIS — D472 Monoclonal gammopathy: Secondary | ICD-10-CM | POA: Diagnosis not present

## 2016-09-22 DIAGNOSIS — N186 End stage renal disease: Secondary | ICD-10-CM | POA: Diagnosis not present

## 2016-09-22 DIAGNOSIS — N2581 Secondary hyperparathyroidism of renal origin: Secondary | ICD-10-CM | POA: Diagnosis not present

## 2016-09-22 DIAGNOSIS — D509 Iron deficiency anemia, unspecified: Secondary | ICD-10-CM | POA: Diagnosis not present

## 2016-09-22 DIAGNOSIS — N185 Chronic kidney disease, stage 5: Secondary | ICD-10-CM | POA: Diagnosis not present

## 2016-09-24 ENCOUNTER — Other Ambulatory Visit: Payer: Self-pay | Admitting: Interventional Cardiology

## 2016-09-24 DIAGNOSIS — D472 Monoclonal gammopathy: Secondary | ICD-10-CM | POA: Diagnosis not present

## 2016-09-24 DIAGNOSIS — D509 Iron deficiency anemia, unspecified: Secondary | ICD-10-CM | POA: Diagnosis not present

## 2016-09-24 DIAGNOSIS — N186 End stage renal disease: Secondary | ICD-10-CM | POA: Diagnosis not present

## 2016-09-24 DIAGNOSIS — N185 Chronic kidney disease, stage 5: Secondary | ICD-10-CM | POA: Diagnosis not present

## 2016-09-24 DIAGNOSIS — N2581 Secondary hyperparathyroidism of renal origin: Secondary | ICD-10-CM | POA: Diagnosis not present

## 2016-09-25 DIAGNOSIS — N186 End stage renal disease: Secondary | ICD-10-CM | POA: Diagnosis not present

## 2016-09-25 DIAGNOSIS — Z992 Dependence on renal dialysis: Secondary | ICD-10-CM | POA: Diagnosis not present

## 2016-09-26 DIAGNOSIS — D649 Anemia, unspecified: Secondary | ICD-10-CM | POA: Diagnosis not present

## 2016-09-26 DIAGNOSIS — Z23 Encounter for immunization: Secondary | ICD-10-CM | POA: Diagnosis not present

## 2016-09-26 DIAGNOSIS — D631 Anemia in chronic kidney disease: Secondary | ICD-10-CM | POA: Diagnosis not present

## 2016-09-26 DIAGNOSIS — N2581 Secondary hyperparathyroidism of renal origin: Secondary | ICD-10-CM | POA: Diagnosis not present

## 2016-09-26 DIAGNOSIS — Z992 Dependence on renal dialysis: Secondary | ICD-10-CM | POA: Diagnosis not present

## 2016-09-26 DIAGNOSIS — D472 Monoclonal gammopathy: Secondary | ICD-10-CM | POA: Diagnosis not present

## 2016-09-26 DIAGNOSIS — N185 Chronic kidney disease, stage 5: Secondary | ICD-10-CM | POA: Diagnosis not present

## 2016-09-26 DIAGNOSIS — N186 End stage renal disease: Secondary | ICD-10-CM | POA: Diagnosis not present

## 2016-09-28 ENCOUNTER — Other Ambulatory Visit: Payer: Self-pay

## 2016-09-28 DIAGNOSIS — D472 Monoclonal gammopathy: Secondary | ICD-10-CM | POA: Diagnosis not present

## 2016-09-28 DIAGNOSIS — D649 Anemia, unspecified: Secondary | ICD-10-CM | POA: Diagnosis not present

## 2016-09-28 DIAGNOSIS — D631 Anemia in chronic kidney disease: Secondary | ICD-10-CM | POA: Diagnosis not present

## 2016-09-28 DIAGNOSIS — N186 End stage renal disease: Secondary | ICD-10-CM | POA: Diagnosis not present

## 2016-09-28 DIAGNOSIS — N2581 Secondary hyperparathyroidism of renal origin: Secondary | ICD-10-CM | POA: Diagnosis not present

## 2016-09-28 DIAGNOSIS — Z23 Encounter for immunization: Secondary | ICD-10-CM | POA: Diagnosis not present

## 2016-09-28 DIAGNOSIS — N185 Chronic kidney disease, stage 5: Secondary | ICD-10-CM | POA: Diagnosis not present

## 2016-09-28 NOTE — Patient Outreach (Signed)
[name]) Care Management  09/28/16  Wendy Kerr Feb 02, 1931 [id number]  Second attempt to reach patient's son, [name] without success. Left HIPAA compliant voicemail with RNCM contact information and invited callback.  [place] R. Alen Matheson, RN, BSN, [place] Management Coordinator [phone]

## 2016-09-29 DIAGNOSIS — D631 Anemia in chronic kidney disease: Secondary | ICD-10-CM | POA: Diagnosis not present

## 2016-09-29 DIAGNOSIS — D472 Monoclonal gammopathy: Secondary | ICD-10-CM | POA: Diagnosis not present

## 2016-09-29 DIAGNOSIS — N185 Chronic kidney disease, stage 5: Secondary | ICD-10-CM | POA: Diagnosis not present

## 2016-09-29 DIAGNOSIS — N2581 Secondary hyperparathyroidism of renal origin: Secondary | ICD-10-CM | POA: Diagnosis not present

## 2016-09-29 DIAGNOSIS — Z23 Encounter for immunization: Secondary | ICD-10-CM | POA: Diagnosis not present

## 2016-09-29 DIAGNOSIS — D649 Anemia, unspecified: Secondary | ICD-10-CM | POA: Diagnosis not present

## 2016-09-29 DIAGNOSIS — N186 End stage renal disease: Secondary | ICD-10-CM | POA: Diagnosis not present

## 2016-10-01 ENCOUNTER — Other Ambulatory Visit: Payer: Self-pay

## 2016-10-01 DIAGNOSIS — D649 Anemia, unspecified: Secondary | ICD-10-CM | POA: Diagnosis not present

## 2016-10-01 DIAGNOSIS — N185 Chronic kidney disease, stage 5: Secondary | ICD-10-CM | POA: Diagnosis not present

## 2016-10-01 DIAGNOSIS — N2581 Secondary hyperparathyroidism of renal origin: Secondary | ICD-10-CM | POA: Diagnosis not present

## 2016-10-01 DIAGNOSIS — D631 Anemia in chronic kidney disease: Secondary | ICD-10-CM | POA: Diagnosis not present

## 2016-10-01 DIAGNOSIS — N186 End stage renal disease: Secondary | ICD-10-CM | POA: Diagnosis not present

## 2016-10-01 DIAGNOSIS — Z23 Encounter for immunization: Secondary | ICD-10-CM | POA: Diagnosis not present

## 2016-10-01 DIAGNOSIS — D472 Monoclonal gammopathy: Secondary | ICD-10-CM | POA: Diagnosis not present

## 2016-10-03 DIAGNOSIS — D472 Monoclonal gammopathy: Secondary | ICD-10-CM | POA: Diagnosis not present

## 2016-10-03 DIAGNOSIS — N185 Chronic kidney disease, stage 5: Secondary | ICD-10-CM | POA: Diagnosis not present

## 2016-10-03 DIAGNOSIS — N186 End stage renal disease: Secondary | ICD-10-CM | POA: Diagnosis not present

## 2016-10-03 DIAGNOSIS — N2581 Secondary hyperparathyroidism of renal origin: Secondary | ICD-10-CM | POA: Diagnosis not present

## 2016-10-03 DIAGNOSIS — D631 Anemia in chronic kidney disease: Secondary | ICD-10-CM | POA: Diagnosis not present

## 2016-10-03 DIAGNOSIS — D649 Anemia, unspecified: Secondary | ICD-10-CM | POA: Diagnosis not present

## 2016-10-03 DIAGNOSIS — Z23 Encounter for immunization: Secondary | ICD-10-CM | POA: Diagnosis not present

## 2016-10-04 ENCOUNTER — Other Ambulatory Visit: Payer: Self-pay | Admitting: *Deleted

## 2016-10-04 NOTE — Patient Outreach (Signed)
[facility]) Care Management  10/04/2016  Wendy Kerr 08/09/1930 [id number]   CSW spoke with patient who informed CSW that she has been approved for Medicaid. CSW also spoke with patient's son, [name] (ph#: [phone]) who states that he is now working with the case worker at [facility] to get his mother approved for [employer]) as she has fallen now twice in the middle of the night, currently her brother-in-law has been staying with her at night but is not able to provide assistance if she falls. Patient's son has home health coming out to provide physical therapy to help increase her strength.   CSW will perform a case closure on patient, as all goals of treatment have been met from social work standpoint (patient has been approved for Medicaid) and no additional social work needs have been identified at this time. CSW will notify patient's RNCM with [facility], Tish Men of CSW's plans to close patient's case.   CSW will fax an update to patient's Primary Care Physician, Dr. Pricilla Holm to ensure that they are aware of CSW's involvement with patient's plan of care. CSW will ensure that patient & son are aware of RNCM with [facility] continued involvement with patient's care.    Raynaldo Opitz, LCSW [facility]  Clinical Social Worker cell #: [phone]

## 2016-10-05 ENCOUNTER — Other Ambulatory Visit: Payer: Self-pay

## 2016-10-05 DIAGNOSIS — N185 Chronic kidney disease, stage 5: Secondary | ICD-10-CM | POA: Diagnosis not present

## 2016-10-05 DIAGNOSIS — D631 Anemia in chronic kidney disease: Secondary | ICD-10-CM | POA: Diagnosis not present

## 2016-10-05 DIAGNOSIS — D649 Anemia, unspecified: Secondary | ICD-10-CM | POA: Diagnosis not present

## 2016-10-05 DIAGNOSIS — N186 End stage renal disease: Secondary | ICD-10-CM | POA: Diagnosis not present

## 2016-10-05 DIAGNOSIS — D472 Monoclonal gammopathy: Secondary | ICD-10-CM | POA: Diagnosis not present

## 2016-10-05 DIAGNOSIS — Z23 Encounter for immunization: Secondary | ICD-10-CM | POA: Diagnosis not present

## 2016-10-05 DIAGNOSIS — N2581 Secondary hyperparathyroidism of renal origin: Secondary | ICD-10-CM | POA: Diagnosis not present

## 2016-10-05 NOTE — Patient Outreach (Signed)
[facility]) Care Management  10/05/16  JAMAYAH MYSZKA 07-23-1930 [id number]  Third and final outreach attempt completed without success. Left HIPAA compliant voicemail with RNCM contact information and requested callback.   Plan- RNCM will send outreach barrier letter and wait 10 business days before completing case closure if no return call has been received.  [place] R. Needham Biggins, RN, BSN, [place] Management Coordinator [phone]

## 2016-10-06 DIAGNOSIS — D472 Monoclonal gammopathy: Secondary | ICD-10-CM | POA: Diagnosis not present

## 2016-10-06 DIAGNOSIS — N186 End stage renal disease: Secondary | ICD-10-CM | POA: Diagnosis not present

## 2016-10-06 DIAGNOSIS — D631 Anemia in chronic kidney disease: Secondary | ICD-10-CM | POA: Diagnosis not present

## 2016-10-06 DIAGNOSIS — Z23 Encounter for immunization: Secondary | ICD-10-CM | POA: Diagnosis not present

## 2016-10-06 DIAGNOSIS — N185 Chronic kidney disease, stage 5: Secondary | ICD-10-CM | POA: Diagnosis not present

## 2016-10-06 DIAGNOSIS — N2581 Secondary hyperparathyroidism of renal origin: Secondary | ICD-10-CM | POA: Diagnosis not present

## 2016-10-06 DIAGNOSIS — D649 Anemia, unspecified: Secondary | ICD-10-CM | POA: Diagnosis not present

## 2016-10-08 ENCOUNTER — Other Ambulatory Visit (HOSPITAL_COMMUNITY): Payer: Self-pay | Admitting: Nephrology

## 2016-10-08 DIAGNOSIS — N2581 Secondary hyperparathyroidism of renal origin: Secondary | ICD-10-CM | POA: Diagnosis not present

## 2016-10-08 DIAGNOSIS — M79605 Pain in left leg: Secondary | ICD-10-CM

## 2016-10-08 DIAGNOSIS — D472 Monoclonal gammopathy: Secondary | ICD-10-CM | POA: Diagnosis not present

## 2016-10-08 DIAGNOSIS — N185 Chronic kidney disease, stage 5: Secondary | ICD-10-CM | POA: Diagnosis not present

## 2016-10-08 DIAGNOSIS — D649 Anemia, unspecified: Secondary | ICD-10-CM | POA: Diagnosis not present

## 2016-10-08 DIAGNOSIS — Z23 Encounter for immunization: Secondary | ICD-10-CM | POA: Diagnosis not present

## 2016-10-08 DIAGNOSIS — N186 End stage renal disease: Secondary | ICD-10-CM | POA: Diagnosis not present

## 2016-10-08 DIAGNOSIS — D631 Anemia in chronic kidney disease: Secondary | ICD-10-CM | POA: Diagnosis not present

## 2016-10-08 DIAGNOSIS — M7989 Other specified soft tissue disorders: Principal | ICD-10-CM

## 2016-10-09 ENCOUNTER — Encounter: Payer: Self-pay | Admitting: Vascular Surgery

## 2016-10-09 ENCOUNTER — Ambulatory Visit (HOSPITAL_COMMUNITY)
Admission: RE | Admit: 2016-10-09 | Discharge: 2016-10-09 | Disposition: A | Discharge status: Home / Self Care | Payer: Medicare Other | Source: Ambulatory Visit | Attending: Vascular Surgery | Admitting: Vascular Surgery

## 2016-10-09 DIAGNOSIS — M79605 Pain in left leg: Secondary | ICD-10-CM | POA: Diagnosis not present

## 2016-10-09 DIAGNOSIS — M7989 Other specified soft tissue disorders: Secondary | ICD-10-CM | POA: Diagnosis not present

## 2016-10-10 DIAGNOSIS — N186 End stage renal disease: Secondary | ICD-10-CM | POA: Diagnosis not present

## 2016-10-10 DIAGNOSIS — Z23 Encounter for immunization: Secondary | ICD-10-CM | POA: Diagnosis not present

## 2016-10-10 DIAGNOSIS — N185 Chronic kidney disease, stage 5: Secondary | ICD-10-CM | POA: Diagnosis not present

## 2016-10-10 DIAGNOSIS — D649 Anemia, unspecified: Secondary | ICD-10-CM | POA: Diagnosis not present

## 2016-10-10 DIAGNOSIS — N2581 Secondary hyperparathyroidism of renal origin: Secondary | ICD-10-CM | POA: Diagnosis not present

## 2016-10-10 DIAGNOSIS — D472 Monoclonal gammopathy: Secondary | ICD-10-CM | POA: Diagnosis not present

## 2016-10-10 DIAGNOSIS — D631 Anemia in chronic kidney disease: Secondary | ICD-10-CM | POA: Diagnosis not present

## 2016-10-11 ENCOUNTER — Ambulatory Visit (INDEPENDENT_AMBULATORY_CARE_PROVIDER_SITE_OTHER): Payer: Medicare Other | Admitting: Interventional Cardiology

## 2016-10-11 ENCOUNTER — Encounter: Payer: Self-pay | Admitting: Interventional Cardiology

## 2016-10-11 VITALS — BP 90/64 | Ht 63.0 in | Wt 170.3 lb

## 2016-10-11 DIAGNOSIS — E669 Obesity, unspecified: Secondary | ICD-10-CM

## 2016-10-11 DIAGNOSIS — I10 Essential (primary) hypertension: Secondary | ICD-10-CM

## 2016-10-11 DIAGNOSIS — N186 End stage renal disease: Secondary | ICD-10-CM | POA: Diagnosis not present

## 2016-10-11 DIAGNOSIS — R601 Generalized edema: Secondary | ICD-10-CM

## 2016-10-11 DIAGNOSIS — I484 Atypical atrial flutter: Secondary | ICD-10-CM

## 2016-10-11 DIAGNOSIS — E1169 Type 2 diabetes mellitus with other specified complication: Secondary | ICD-10-CM

## 2016-10-11 DIAGNOSIS — I5022 Chronic systolic (congestive) heart failure: Secondary | ICD-10-CM

## 2016-10-11 DIAGNOSIS — I4510 Unspecified right bundle-branch block: Secondary | ICD-10-CM | POA: Diagnosis not present

## 2016-10-11 NOTE — Progress Notes (Signed)
Cardiology Office Note    Date:  10/11/2016   ID:  Wendy, Kerr [birth year], MRN [id number]  PCP:  Larene Beach, MD  Cardiologist: Sinclair Grooms, MD   Chief Complaint  Patient presents with  . Congestive Heart Failure  . Coronary Artery Disease  . Atrial Fibrillation    History of Present Illness:  Wendy Kerr is a 83 y.o. female chronic anemia, chronic kidney disease stage III IV, hypertension, suspected underlying coronary artery disease, type 2 diabetes mellitus, Elevated TSH, obstructive sleep apnea, relatively recent small bowel obstruction and adrenal mass and presumed benign pancreatic lesion noted on CT.  Now on dialysis since June. She continues to have persistent pruritus and chronic cough. She has tolerated dialysis via a right subclavian temporary dialysis catheter. Appetite is been poor. Progressive weight loss is occurred. Continued difficulty with abdominal swelling has occurred.  Past Medical History:  Diagnosis Date  . 1st degree AV block   . Anemia   . Atrial flutter ([place])   . Benign neoplasm of pancreas, except islets of Langerhans   . Bronchitis, mucopurulent recurrent ([place])   . Cataract    B/L  . CHF (congestive heart failure) ([place])   . Chronic kidney disease   . Cirrhosis of liver ([place])    " just a little"  . Common migraine   . Diabetes mellitus ([place])   . Diverticulosis of colon   . DJD (degenerative joint disease)   . Generalized anxiety disorder   . GERD (gastroesophageal reflux disease)   . Hypercholesterolemia   . Hypertension   . Monoclonal gammopathy   . Obesity   . OSA (obstructive sleep apnea)    severe with AHI 37 events per hour  . Vitamin D deficiency     Past Surgical History:  Procedure Laterality Date  . ABDOMINAL HYSTERECTOMY  1972  . AV FISTULA PLACEMENT Right 07/13/2016   Procedure: Creation Arteriovenous Fistula Right Arm;  Surgeon: Angelia Mould, MD;  Location: [facility];  Service: Vascular;   Laterality: Right;  . COLONOSCOPY    . INSERTION OF DIALYSIS CATHETER Right 07/20/2016   Procedure: INSERTION OF TUNNELED DIALYSIS CATHETER - RIGHT INTERNAL JUGULAR;  Surgeon: Angelia Mould, MD;  Location: [facility];  Service: Vascular;  Laterality: Right;  . LAPAROTOMY N/A 12/23/2015   Procedure: EXPLORATORY LAPAROTOMY FOR SMALL BOWEL OBSTRUCTION;  Surgeon: Judeth Horn, MD;  Location: [place];  Service: General;  Laterality: N/A;  . resection serous cystadenoma of pancreas  2004   at [facility].  Now (2017) with Dr Bridgett Larsson with [facility]    Current Medications: Outpatient Medications Prior to Visit  Medication Sig Dispense Refill  . atorvastatin (LIPITOR) 20 MG tablet Take 1 tablet (20 mg total) by mouth daily at 6 PM. 30 tablet 6  . ELIQUIS 2.5 MG TABS tablet TAKE 1 TABLET BY MOUTH 2 TIMES DAILY. (Patient taking differently: TAKE 2.5 MG BY MOUTH 2 TIMES DAILY.) 60 tablet 11  . IRON PO Take 1 tablet by mouth daily.     . mirtazapine (REMERON) 15 MG tablet Take 15 mg by mouth at bedtime as needed (for itching or sleep).   2  . Multiple Vitamin (MULTIVITAMIN WITH MINERALS) TABS tablet Take 1 tablet by mouth every morning.    . nitroGLYCERIN (NITROSTAT) 0.4 MG SL tablet Place 1 tablet (0.4 mg total) under the tongue every 5 (five) minutes as needed for chest pain. 5 tablet 3  . pantoprazole (PROTONIX) 40 MG tablet  Take 40 mg by mouth 2 (two) times daily as needed (for acid reflux).     . promethazine (PHENERGAN) 12.5 MG tablet Take 12.5 mg by mouth every 6 (six) hours as needed for nausea/vomiting.  0  . Vitamin D, Ergocalciferol, (DRISDOL) 50000 units CAPS capsule Take 1 capsule (50,000 Units total) by mouth every 7 (seven) days. (Patient taking differently: Take 50,000 Units by mouth every Monday. ) 12 capsule 3  . cholestyramine light (PREVALITE) 4 g packet Take 1 packet (4 g total) by mouth once. 1 packet 0  . hydrOXYzine (ATARAX/VISTARIL) 25 MG tablet TAKE 1 TABLET (25 MG TOTAL) BY MOUTH 3 (THREE)  TIMES DAILY AS NEEDED. (Patient not taking: Reported on 10/11/2016) 90 tablet 0  . levothyroxine (SYNTHROID, LEVOTHROID) 100 MCG tablet Take 1 tablet (100 mcg total) by mouth daily before breakfast. (Patient not taking: Reported on 10/11/2016) 30 tablet 0  . midodrine (PROAMATINE) 10 MG tablet Take 1 tablet (10 mg total) by mouth 3 (three) times daily with meals. 90 tablet 0  . midodrine (PROAMATINE) 5 MG tablet Take 1 tablet (5 mg total) by mouth every Monday, Wednesday, and Friday with hemodialysis. 30 tablet 0  . oxyCODONE-acetaminophen (ROXICET) 5-325 MG tablet Take 1 tablet by mouth every 4 (four) hours as needed. (Patient not taking: Reported on 10/11/2016) 10 tablet 0   No facility-administered medications prior to visit.      Allergies:   Piroxicam   Social History   Social History  . Marital status: Single    Spouse name: N/A  . Number of children: 2  . Years of education: N/A   Occupational History  . 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 Smoking status: Never Smoker  . Smokeless tobacco: Never Used  . Alcohol use No  . Drug use: No  . Sexual activity: Not Asked   Other Topics Concern  . 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  Ambulates independently.     Family History:  The patient's family history includes Heart Problems (age of onset: 23) in her mother; Heart Problems (age of onset: 96) in her father; Hypertension in her father and mother; Kidney disease in her other and sister; Kidney failure in her father and mother.   ROS:   Please see the history of present illness.    Essentially blind due to bilateral cataracts which have not been removed because of chronic hacking cough. Leg swelling and abdominal swelling. Diagnosis of cirrhosis. Excessive sweating.  All other systems reviewed and are negative.  PHYSICAL EXAM:   VS:  BP 90/64 (BP Location: Left Arm)   Ht 5\' 3"   (1.6 m)   Wt 170 lb 4.8 oz (77.2 kg)   BMI 30.17 kg/m    GEN: Well nourished, well developed, in no acute distress  HEENT: normal  Neck: Marked bilateral JVD with the patient sitting at 90. Faint bilateral carotid bruits, but no masses Cardiac: RRR; 2/6 apical systolic murmur, but no rub.. Bilateral 1+ to 2+ tense edema both lower extremities. Respiratory:  clear to auscultation bilaterally, normal work of breathing GI: soft, nontender, nondistended, + BS MS: no deformity or atrophy  Skin: warm and dry, no rash Neuro:  Alert and Oriented x 3, Strength and sensation are intact Psych: euthymic mood, full affect  Wt Readings from Last 3 Encounters:  10/11/16 170 lb 4.8 oz (77.2  kg)  08/13/16 168 lb 6.9 oz (76.4 kg)  07/13/16 163 lb (73.9 kg)      Studies/Labs Reviewed:   EKG:  EKG  Performed 08/10/2016 demonstrates right bundle, inferior infarct, anteroseptal infarct, sinus rhythm, and when compared to prior tracings from 12/22/2015, the heart rate is slower.  Recent Labs: 06/26/2016: Pro B Natriuretic peptide (BNP) 3,417.0 08/09/2016: ALT 15; ALT 15; B Natriuretic Peptide >4,500.0; TSH 8.460 08/11/2016: Magnesium 2.0 08/13/2016: BUN 21; Creatinine, Ser 3.24; Hemoglobin 10.6; Platelets 208; Potassium 3.7; Sodium 130   Lipid Panel    Component Value Date/Time   CHOL 185 07/30/2012 1052   TRIG 119.0 07/30/2012 1052   HDL 59.70 07/30/2012 1052   CHOLHDL 3 07/30/2012 1052   VLDL 23.8 07/30/2012 1052   LDLCALC 102 (H) 07/30/2012 1052   LDLDIRECT 142.5 08/11/2007 1021    Additional studies/ records that were reviewed today include:  Echocardiogram 08/11/2016: ------------------------------------------------------------------- Study Conclusions  - Left ventricle: The cavity size was normal. Wall thickness was   increased in a pattern of severe LVH. Systolic function was   mildly to moderately reduced. The estimated ejection fraction was   in the range of 40% to 45%. Diffuse  hypokinesis. Features are   consistent with a pseudonormal left ventricular filling pattern,   with concomitant abnormal relaxation and increased filling   pressure (grade 2 diastolic dysfunction). Doppler parameters are   consistent with high ventricular filling pressure. - Aortic valve: Mildly calcified annulus. Trileaflet; mildly   thickened leaflets. Valve area (VTI): 2.34 cm^2. Valve area   (Vmax): 2.04 cm^2. Valve area (Vmean): 2.4 cm^2. - Mitral valve: Mildly calcified annulus. Mildly thickened leaflets   . There was mild regurgitation. - Left atrium: The atrium was moderately dilated. - Right ventricle: The cavity size was mildly to moderately   dilated. Systolic function was moderately reduced. - Right atrium: The atrial septum bows to the left, suggesting   elevated RA pressure. The atrium was moderately dilated. - Tricuspid valve: There was moderate-severe regurgitation. - Pulmonary arteries: Systolic pressure was mildly increased. PA   peak pressure: 37 mm Hg (S). - Inferior vena cava: The vessel was dilated. The respirophasic   diameter changes were blunted (< 50%), consistent with elevated   central venous pressure. - Pericardium, extracardiac: There is a small to moderate   pericardial effusion adjacent to the LV, measuring 1.1 cm in   diastole. There is no evidence of tamponade physiology.   ASSESSMENT:    1. Chronic systolic heart failure ([place])   2. Essential hypertension   3. End stage kidney disease ([place])   4. Atypical atrial flutter ([place])   5. Right bundle branch block   6. Diabetes mellitus type 2 in obese ([place])   7. Anasarca      PLAN:  In order of problems listed above:  1. She actually has chronic combined systolic and diastolic heart failure with some improvement in LVEF now that she is on dialysis. There is also evidence from the recent echo of right heart failure which I believe is secondary. Treatment basically is dependent upon dialysis to  maintain volume status. 2. Low blood pressures being treated with Midrin 3. She is currently on dialysis via a temporary right subclavian artery access catheter. This is being managed by Dr. Elenore Rota. 4. Continue anticoagulation therapy for history of recurrent atrial flutter/fibrillation and risk of stroke.  Overall prognosis is quite poor especially with advanced muscle wasting, weight loss, and frailty. There are no specific cardiac recommendations.  We will set an appointment for 6 months. I have told her that it would be fine to cancel the appointment if she does not feel like coming.    Medication Adjustments/Labs and Tests Ordered: Current medicines are reviewed at length with the patient today.  Concerns regarding medicines are outlined above.  Medication changes, Labs and Tests ordered today are listed in the Patient Instructions below. Patient Instructions  Medication Instructions:  None  Labwork: None  Testing/Procedures: None  Follow-Up: Your physician wants you to follow-up in: 6 months with Dr. Tamala Julian.  You will receive a reminder letter in the mail two months in advance. If you don't receive a letter, please call our office to schedule the follow-up appointment.   Any Other Special Instructions Will Be Listed Below (If Applicable).     If you need a refill on your cardiac medications before your next appointment, please call your pharmacy.      Signed, Sinclair Grooms, MD  10/11/2016 5:41 PM    [facility] [address], [place], [place]  [address] Phone: [phone]; Fax: [phone]

## 2016-10-11 NOTE — Patient Instructions (Signed)
Medication Instructions:  None  Labwork: None  Testing/Procedures: None  Follow-Up: Your physician wants you to follow-up in: 6 months with Dr. Smith.  You will receive a reminder letter in the mail two months in advance. If you don't receive a letter, please call our office to schedule the follow-up appointment.   Any Other Special Instructions Will Be Listed Below (If Applicable).     If you need a refill on your cardiac medications before your next appointment, please call your pharmacy.   

## 2016-10-12 ENCOUNTER — Encounter (HOSPITAL_COMMUNITY): Payer: Self-pay | Admitting: *Deleted

## 2016-10-12 ENCOUNTER — Observation Stay (HOSPITAL_COMMUNITY)
Admission: EM | Admit: 2016-10-12 | Discharge: 2016-10-15 | Disposition: A | Discharge status: Home / Self Care | Payer: Medicare Other | Attending: Internal Medicine | Admitting: Internal Medicine

## 2016-10-12 DIAGNOSIS — W19XXXA Unspecified fall, initial encounter: Secondary | ICD-10-CM | POA: Diagnosis present

## 2016-10-12 DIAGNOSIS — I313 Pericardial effusion (noninflammatory): Secondary | ICD-10-CM | POA: Insufficient documentation

## 2016-10-12 DIAGNOSIS — E559 Vitamin D deficiency, unspecified: Secondary | ICD-10-CM | POA: Insufficient documentation

## 2016-10-12 DIAGNOSIS — M25562 Pain in left knee: Secondary | ICD-10-CM

## 2016-10-12 DIAGNOSIS — R053 Chronic cough: Secondary | ICD-10-CM

## 2016-10-12 DIAGNOSIS — E039 Hypothyroidism, unspecified: Secondary | ICD-10-CM | POA: Diagnosis not present

## 2016-10-12 DIAGNOSIS — R531 Weakness: Secondary | ICD-10-CM | POA: Diagnosis not present

## 2016-10-12 DIAGNOSIS — S098XXA Other specified injuries of head, initial encounter: Secondary | ICD-10-CM | POA: Diagnosis not present

## 2016-10-12 DIAGNOSIS — Z6829 Body mass index (BMI) 29.0-29.9, adult: Secondary | ICD-10-CM | POA: Diagnosis not present

## 2016-10-12 DIAGNOSIS — Z992 Dependence on renal dialysis: Secondary | ICD-10-CM | POA: Diagnosis not present

## 2016-10-12 DIAGNOSIS — E669 Obesity, unspecified: Secondary | ICD-10-CM | POA: Diagnosis not present

## 2016-10-12 DIAGNOSIS — I132 Hypertensive heart and chronic kidney disease with heart failure and with stage 5 chronic kidney disease, or end stage renal disease: Secondary | ICD-10-CM | POA: Insufficient documentation

## 2016-10-12 DIAGNOSIS — M170 Bilateral primary osteoarthritis of knee: Secondary | ICD-10-CM | POA: Insufficient documentation

## 2016-10-12 DIAGNOSIS — L299 Pruritus, unspecified: Secondary | ICD-10-CM | POA: Insufficient documentation

## 2016-10-12 DIAGNOSIS — D472 Monoclonal gammopathy: Secondary | ICD-10-CM | POA: Insufficient documentation

## 2016-10-12 DIAGNOSIS — R55 Syncope and collapse: Secondary | ICD-10-CM | POA: Diagnosis not present

## 2016-10-12 DIAGNOSIS — D509 Iron deficiency anemia, unspecified: Secondary | ICD-10-CM | POA: Diagnosis not present

## 2016-10-12 DIAGNOSIS — I4892 Unspecified atrial flutter: Secondary | ICD-10-CM | POA: Diagnosis not present

## 2016-10-12 DIAGNOSIS — E1122 Type 2 diabetes mellitus with diabetic chronic kidney disease: Secondary | ICD-10-CM | POA: Insufficient documentation

## 2016-10-12 DIAGNOSIS — N185 Chronic kidney disease, stage 5: Secondary | ICD-10-CM | POA: Diagnosis not present

## 2016-10-12 DIAGNOSIS — G4733 Obstructive sleep apnea (adult) (pediatric): Secondary | ICD-10-CM | POA: Diagnosis not present

## 2016-10-12 DIAGNOSIS — Z841 Family history of disorders of kidney and ureter: Secondary | ICD-10-CM | POA: Insufficient documentation

## 2016-10-12 DIAGNOSIS — R05 Cough: Secondary | ICD-10-CM

## 2016-10-12 DIAGNOSIS — I5043 Acute on chronic combined systolic (congestive) and diastolic (congestive) heart failure: Secondary | ICD-10-CM | POA: Diagnosis not present

## 2016-10-12 DIAGNOSIS — E7800 Pure hypercholesterolemia, unspecified: Secondary | ICD-10-CM | POA: Diagnosis not present

## 2016-10-12 DIAGNOSIS — Z8249 Family history of ischemic heart disease and other diseases of the circulatory system: Secondary | ICD-10-CM | POA: Insufficient documentation

## 2016-10-12 DIAGNOSIS — E1169 Type 2 diabetes mellitus with other specified complication: Secondary | ICD-10-CM | POA: Diagnosis present

## 2016-10-12 DIAGNOSIS — Z79899 Other long term (current) drug therapy: Secondary | ICD-10-CM | POA: Diagnosis not present

## 2016-10-12 DIAGNOSIS — S0993XA Unspecified injury of face, initial encounter: Secondary | ICD-10-CM | POA: Diagnosis not present

## 2016-10-12 DIAGNOSIS — N186 End stage renal disease: Secondary | ICD-10-CM | POA: Insufficient documentation

## 2016-10-12 DIAGNOSIS — K7460 Unspecified cirrhosis of liver: Secondary | ICD-10-CM | POA: Insufficient documentation

## 2016-10-12 DIAGNOSIS — Z7901 Long term (current) use of anticoagulants: Secondary | ICD-10-CM | POA: Diagnosis not present

## 2016-10-12 DIAGNOSIS — I4510 Unspecified right bundle-branch block: Secondary | ICD-10-CM | POA: Diagnosis not present

## 2016-10-12 DIAGNOSIS — I959 Hypotension, unspecified: Secondary | ICD-10-CM | POA: Diagnosis not present

## 2016-10-12 DIAGNOSIS — I272 Pulmonary hypertension, unspecified: Secondary | ICD-10-CM | POA: Insufficient documentation

## 2016-10-12 DIAGNOSIS — K219 Gastro-esophageal reflux disease without esophagitis: Secondary | ICD-10-CM | POA: Insufficient documentation

## 2016-10-12 DIAGNOSIS — I5022 Chronic systolic (congestive) heart failure: Secondary | ICD-10-CM | POA: Diagnosis present

## 2016-10-12 DIAGNOSIS — Z888 Allergy status to other drugs, medicaments and biological substances status: Secondary | ICD-10-CM | POA: Insufficient documentation

## 2016-10-12 DIAGNOSIS — R748 Abnormal levels of other serum enzymes: Secondary | ICD-10-CM | POA: Insufficient documentation

## 2016-10-12 LAB — CBC WITH DIFFERENTIAL/PLATELET
BASOS PCT: 0 %
Basophils Absolute: 0 10*3/uL (ref 0.0–0.1)
EOS ABS: 0.1 10*3/uL (ref 0.0–0.7)
Eosinophils Relative: 1 %
HCT: 35.6 % — ABNORMAL LOW (ref 36.0–46.0)
Hemoglobin: 12.4 g/dL (ref 12.0–15.0)
LYMPHS ABS: 0.5 10*3/uL — AB (ref 0.7–4.0)
Lymphocytes Relative: 6 %
MCH: 29.3 pg (ref 26.0–34.0)
MCHC: 34.8 g/dL (ref 30.0–36.0)
MCV: 84.2 fL (ref 78.0–100.0)
MONO ABS: 1.1 10*3/uL — AB (ref 0.1–1.0)
MONOS PCT: 12 %
Neutro Abs: 7.6 10*3/uL (ref 1.7–7.7)
Neutrophils Relative %: 81 %
Platelets: 313 10*3/uL (ref 150–400)
RBC: 4.23 MIL/uL (ref 3.87–5.11)
RDW: 18.9 % — ABNORMAL HIGH (ref 11.5–15.5)
WBC: 9.3 10*3/uL (ref 4.0–10.5)

## 2016-10-12 LAB — COMPREHENSIVE METABOLIC PANEL
ALBUMIN: 3 g/dL — AB (ref 3.5–5.0)
ALK PHOS: 133 U/L — AB (ref 38–126)
ALT: 16 U/L (ref 14–54)
AST: 35 U/L (ref 15–41)
Anion gap: 16 — ABNORMAL HIGH (ref 5–15)
BILIRUBIN TOTAL: 1 mg/dL (ref 0.3–1.2)
BUN: 34 mg/dL — AB (ref 6–20)
CHLORIDE: 94 mmol/L — AB (ref 101–111)
CO2: 24 mmol/L (ref 22–32)
Calcium: 9.2 mg/dL (ref 8.9–10.3)
Creatinine, Ser: 3.71 mg/dL — ABNORMAL HIGH (ref 0.44–1.00)
GFR calc Af Amer: 12 mL/min — ABNORMAL LOW (ref >60)
GFR, EST NON AFRICAN AMERICAN: 10 mL/min — AB (ref >60)
Glucose, Bld: 139 mg/dL — ABNORMAL HIGH (ref 65–99)
Potassium: 4.4 mmol/L (ref 3.5–5.1)
SODIUM: 134 mmol/L — AB (ref 135–145)
Total Protein: 7.4 g/dL (ref 6.5–8.1)

## 2016-10-12 LAB — I-STAT TROPONIN, ED: TROPONIN I, POC: 0.46 ng/mL — AB (ref 0.00–0.08)

## 2016-10-12 NOTE — ED Provider Notes (Signed)
[facility] DEPT Provider Note   CSN: [id number] Arrival date & time: 10/12/16  1322     History   Chief Complaint Chief Complaint  Patient presents with  . Fall    HPI Wendy Kerr is a 83 y.o. female.  HPI Pt was walking today when her left knee gave out on her as she was walking in to dialysis.  Pt denies any injury but she just felt weak but she did not pass out. She denies any chest pain.  Mild shortness of breath but not more than usual.  No fevers.  SHe has chronic leg swelling but not more than usual.  She was not able to get dialysis because she was sent to the ED.  She has not been able to stand or walk since because of the weakness.  Past Medical History:  Diagnosis Date  . 1st degree AV block   . Anemia   . Atrial flutter ([place])   . Benign neoplasm of pancreas, except islets of Langerhans   . Bronchitis, mucopurulent recurrent ([place])   . Cataract    B/L  . CHF (congestive heart failure) ([place])   . Chronic kidney disease   . Cirrhosis of liver ([place])    " just a little"  . Common migraine   . Diabetes mellitus ([place])   . Diverticulosis of colon   . DJD (degenerative joint disease)   . Generalized anxiety disorder   . GERD (gastroesophageal reflux disease)   . Hypercholesterolemia   . Hypertension   . Monoclonal gammopathy   . Obesity   . OSA (obstructive sleep apnea)    severe with AHI 37 events per hour  . Vitamin D deficiency     Patient Active Problem List   Diagnosis Date Noted  . Arterial hypotension 08/12/2016  . Anasarca   . End stage renal disease on dialysis ([place])   . Abnormal TSH 03/16/2016  . Itching 01/24/2016  . SBO (small bowel obstruction) ([place])   . Acute on chronic systolic CHF (congestive heart failure) ([place]) 11/18/2015  . Vitamin D deficiency 11/30/2014  . Anemia, iron deficiency 11/30/2014  . Atrial flutter ([place]) 06/28/2014  . Right bundle branch block 06/22/2014  . CKD stage 3 due to type 2 diabetes mellitus ([place])  06/21/2014  . OSA (obstructive sleep apnea)   . Pancreatic cystadenoma 01/26/2014  . GERD (gastroesophageal reflux disease) 01/01/2014  . Chronic systolic heart failure ([place]) 01/21/2013    Class: Acute  . BEN NEOPLASM PANCREAS EXCEPT ISLETS LANGERHANS 08/16/2007  . Diabetes mellitus type 2 in obese ([place]) 02/10/2007  . HYPERCHOLESTEROLEMIA 02/10/2007  . MONOCLONAL GAMMOPATHY 02/10/2007  . Essential hypertension 02/10/2007  . ANXIETY DISORDER, GENERALIZED 12/07/2006  . DEGENERATIVE JOINT DISEASE 12/07/2006  . Serous cystadenoma 08/31/2001    Past Surgical History:  Procedure Laterality Date  . ABDOMINAL HYSTERECTOMY  1972  . AV FISTULA PLACEMENT Right 07/13/2016   Procedure: Creation Arteriovenous Fistula Right Arm;  Surgeon: Angelia Mould, MD;  Location: [facility];  Service: Vascular;  Laterality: Right;  . COLONOSCOPY    . INSERTION OF DIALYSIS CATHETER Right 07/20/2016   Procedure: INSERTION OF TUNNELED DIALYSIS CATHETER - RIGHT INTERNAL JUGULAR;  Surgeon: Angelia Mould, MD;  Location: [facility];  Service: Vascular;  Laterality: Right;  . LAPAROTOMY N/A 12/23/2015   Procedure: EXPLORATORY LAPAROTOMY FOR SMALL BOWEL OBSTRUCTION;  Surgeon: Judeth Horn, MD;  Location: [facility];  Service: General;  Laterality: N/A;  . resection serous cystadenoma of pancreas  2004  [facility].  Now (2017) with Dr Bridgett Larsson with [facility]    OB History    No data available       Home Medications    Prior to Admission medications   Medication Sig Start Date End Date Taking? Authorizing Provider  ELIQUIS 2.5 MG TABS tablet TAKE 1 TABLET BY MOUTH 2 TIMES DAILY. Patient taking differently: TAKE 2.5 MG BY MOUTH 2 TIMES DAILY. 07/18/16  Yes Belva Crome, MD  levothyroxine (SYNTHROID, LEVOTHROID) 75 MCG tablet Take 75 mcg by mouth daily before breakfast. 09/06/16  Yes [provider]  midodrine (PROAMATINE) 10 MG tablet Take 10 mg by mouth twice daily on Monday, Wednesday, Friday, and Saturday  with hemodialysis. 08/14/16  Yes [provider]  midodrine (PROAMATINE) 5 MG tablet Take 5 mg by mouth twice daily on Monday, Wednesday, Friday, and Saturday with hemodialysis. 08/15/16  Yes [provider]  mirtazapine (REMERON) 15 MG tablet Take 15 mg by mouth at bedtime as needed (for itching or sleep).  06/29/16  Yes [provider]  Multiple Vitamin (MULTIVITAMIN WITH MINERALS) TABS tablet Take 1 tablet by mouth every morning.   Yes [provider]  oxyCODONE-acetaminophen (PERCOCET/ROXICET) 5-325 MG tablet Take 1 tablet by mouth every 4 (four) hours as needed for pain. 07/22/16  Yes [provider]  pantoprazole (PROTONIX) 40 MG tablet Take 40 mg by mouth 2 (two) times daily as needed (for acid reflux).  07/06/16  Yes [provider]  Vitamin D, Ergocalciferol, (DRISDOL) 50000 units CAPS capsule Take 1 capsule (50,000 Units total) by mouth every 7 (seven) days. Patient taking differently: Take 50,000 Units by mouth every Monday.  01/13/16  Yes Hoyt Koch, MD  atorvastatin (LIPITOR) 20 MG tablet Take 1 tablet (20 mg total) by mouth daily at 6 PM. Patient not taking: Reported on 10/12/2016 11/23/15   Reino Bellis B, NP  IRON PO Take 1 tablet by mouth daily.     [provider]  nitroGLYCERIN (NITROSTAT) 0.4 MG SL tablet Place 1 tablet (0.4 mg total) under the tongue every 5 (five) minutes as needed for chest pain. 08/13/14   Hoyt Koch, MD  promethazine (PHENERGAN) 12.5 MG tablet Take 12.5 mg by mouth every 6 (six) hours as needed for nausea/vomiting. 07/20/16   [provider]    Family History Family History  Problem Relation Age of Onset  . Hypertension Mother   . Kidney failure Mother        dialysis  . Heart Problems Mother 3  . Hypertension Father   . Kidney failure Father        dialysis  . Heart Problems Father 32  . Kidney disease Sister   . Kidney disease Other     Social  History Social History  Substance Use Topics  . Smoking status: Never Smoker  . Smokeless tobacco: Never Used  . Alcohol use No     Allergies   Piroxicam   Review of Systems Review of Systems  All other systems reviewed and are negative.    Physical Exam Updated Vital Signs BP 99/87   Pulse 77   Temp (!) 97.5 F (36.4 C) (Oral)   Resp 18   SpO2 90%   Physical Exam  Constitutional: No distress.  Elderly, frail  HENT:  Head: Normocephalic and atraumatic.  Right Ear: External ear normal.  Left Ear: External ear normal.  Eyes: Conjunctivae are normal. Right eye exhibits no discharge. Left eye exhibits no discharge. No  scleral icterus.  Neck: Neck supple. No tracheal deviation present.  Cardiovascular: Normal rate, regular rhythm and intact distal pulses.   Pulmonary/Chest: Effort normal and breath sounds normal. No stridor. No respiratory distress. She has no wheezes. She has no rales.  Abdominal: Soft. Bowel sounds are normal. She exhibits no distension. There is no tenderness. There is no rebound and no guarding.  Musculoskeletal: She exhibits edema. She exhibits no tenderness.  Joints non tender  Neurological: She is alert. No cranial nerve deficit (no facial droop, extraocular movements intact, no slurred speech) or sensory deficit. She exhibits normal muscle tone. She displays no seizure activity. Coordination normal.  General weakness  Skin: Skin is warm and dry. No rash noted.  Psychiatric: She has a normal mood and affect.  Nursing note and vitals reviewed.    ED Treatments / Results  Labs (all labs ordered are listed, but only abnormal results are displayed) Labs Reviewed  CBC WITH DIFFERENTIAL/PLATELET - Abnormal; Notable for the following:       Result Value   HCT 35.6 (*)    RDW 18.9 (*)    Lymphs Abs 0.5 (*)    Monocytes Absolute 1.1 (*)    All other components within normal limits  COMPREHENSIVE METABOLIC PANEL - Abnormal; Notable for the  following:    Sodium 134 (*)    Chloride 94 (*)    Glucose, Bld 139 (*)    BUN 34 (*)    Creatinine, Ser 3.71 (*)    Albumin 3.0 (*)    Alkaline Phosphatase 133 (*)    GFR calc non Af Amer 10 (*)    GFR calc Af Amer 12 (*)    Anion gap 16 (*)    All other components within normal limits  I-STAT TROPONIN, ED - Abnormal; Notable for the following:    Troponin i, poc 0.46 (*)    All other components within normal limits    EKG  EKG Interpretation  Date/Time:  Friday October 12 2016 21:02:46 EDT Ventricular Rate:  101 PR Interval:    QRS Duration: 152 QT Interval:  449 QTC Calculation: 583 R Axis:   -51 Text Interpretation:  Undetermined rhythm Paired ventricular premature complexes IVCD, consider atypical RBBB Inferior infarct, old Anterolateral infarct, age indeterminate Baseline wander in lead(s) III aVF Confirmed by Dorie Rank [phone]) on 10/12/2016 10:25:10 PM       Radiology No results found.  Procedures Procedures (including critical care time)  Medications Ordered in ED Medications - No data to display   Initial Impression / Assessment and Plan / ED Course  I have reviewed the triage vital signs and the nursing notes.  Pertinent labs & imaging results that were available during my care of the patient were reviewed by me and considered in my medical decision making (see chart for details).  Clinical Course as of Oct 13 2226  Fri Oct 12, 2016  2100 D/w Dr Mercy Moore.  Will see her in the am regarding dialysis  [JK]  2106 Trop is elevated.  Seems to be chronically elevated.  Pt denies chest pain or shortness of breath.  Will continue to monitor.   [JK]    Clinical Course User Index [JK] Dorie Rank, MD    Pt presented with a near syncopal episode.   Pt has chronic kidney disease, chronic low blood pressures.  Troponin is elevated but no chest pain.  Doubt ACS but will need to monitor.  Will consult with the medical service for observation.  Final Clinical  Impressions(s) / ED Diagnoses   Final diagnoses:  Near syncope  Chronic renal failure, stage 5 (HCC)    New Prescriptions New Prescriptions   No medications on file     Dorie Rank, MD 10/12/16 2228

## 2016-10-12 NOTE — ED Notes (Signed)
Pt given [place] sandwich, graham crackers, and peanut butter with water.

## 2016-10-12 NOTE — ED Triage Notes (Signed)
Pt was at HD center when her left knee gave out on her.  Pt denies hip and knee pain.  Pt arrived with c-collar in place and EMS states they put it on due to age.  Pt is not complaining of neck pain.  Pt does have some sob, but states this is normal for her prior to HD.  She is here to just get checked out and make sure she is okay. She has small lac to her right lower bottom lip.

## 2016-10-13 ENCOUNTER — Observation Stay (HOSPITAL_COMMUNITY): Payer: Medicare Other

## 2016-10-13 DIAGNOSIS — R531 Weakness: Secondary | ICD-10-CM | POA: Diagnosis not present

## 2016-10-13 DIAGNOSIS — E669 Obesity, unspecified: Secondary | ICD-10-CM | POA: Diagnosis not present

## 2016-10-13 DIAGNOSIS — R05 Cough: Secondary | ICD-10-CM | POA: Diagnosis not present

## 2016-10-13 DIAGNOSIS — M1710 Unilateral primary osteoarthritis, unspecified knee: Secondary | ICD-10-CM | POA: Diagnosis not present

## 2016-10-13 DIAGNOSIS — N185 Chronic kidney disease, stage 5: Secondary | ICD-10-CM | POA: Diagnosis not present

## 2016-10-13 DIAGNOSIS — E1122 Type 2 diabetes mellitus with diabetic chronic kidney disease: Secondary | ICD-10-CM | POA: Diagnosis not present

## 2016-10-13 DIAGNOSIS — I484 Atypical atrial flutter: Secondary | ICD-10-CM

## 2016-10-13 DIAGNOSIS — I9589 Other hypotension: Secondary | ICD-10-CM | POA: Diagnosis not present

## 2016-10-13 DIAGNOSIS — G8929 Other chronic pain: Secondary | ICD-10-CM | POA: Diagnosis not present

## 2016-10-13 DIAGNOSIS — R55 Syncope and collapse: Secondary | ICD-10-CM | POA: Diagnosis not present

## 2016-10-13 DIAGNOSIS — I5022 Chronic systolic (congestive) heart failure: Secondary | ICD-10-CM

## 2016-10-13 DIAGNOSIS — W19XXXA Unspecified fall, initial encounter: Secondary | ICD-10-CM | POA: Diagnosis present

## 2016-10-13 DIAGNOSIS — E1169 Type 2 diabetes mellitus with other specified complication: Secondary | ICD-10-CM | POA: Diagnosis not present

## 2016-10-13 DIAGNOSIS — M25562 Pain in left knee: Secondary | ICD-10-CM | POA: Diagnosis not present

## 2016-10-13 DIAGNOSIS — M170 Bilateral primary osteoarthritis of knee: Secondary | ICD-10-CM | POA: Diagnosis not present

## 2016-10-13 DIAGNOSIS — I313 Pericardial effusion (noninflammatory): Secondary | ICD-10-CM | POA: Diagnosis not present

## 2016-10-13 DIAGNOSIS — I132 Hypertensive heart and chronic kidney disease with heart failure and with stage 5 chronic kidney disease, or end stage renal disease: Secondary | ICD-10-CM | POA: Diagnosis not present

## 2016-10-13 LAB — RENAL FUNCTION PANEL
Albumin: 2.9 g/dL — ABNORMAL LOW (ref 3.5–5.0)
Anion gap: 16 — ABNORMAL HIGH (ref 5–15)
BUN: 35 mg/dL — ABNORMAL HIGH (ref 6–20)
CO2: 27 mmol/L (ref 22–32)
Calcium: 9.2 mg/dL (ref 8.9–10.3)
Chloride: 93 mmol/L — ABNORMAL LOW (ref 101–111)
Creatinine, Ser: 3.88 mg/dL — ABNORMAL HIGH (ref 0.44–1.00)
GFR calc Af Amer: 11 mL/min — ABNORMAL LOW (ref >60)
GFR calc non Af Amer: 10 mL/min — ABNORMAL LOW (ref >60)
Glucose, Bld: 107 mg/dL — ABNORMAL HIGH (ref 65–99)
Phosphorus: 2.1 mg/dL — ABNORMAL LOW (ref 2.5–4.6)
Potassium: 3.9 mmol/L (ref 3.5–5.1)
Sodium: 136 mmol/L (ref 135–145)

## 2016-10-13 LAB — CBC
HCT: 33.7 % — ABNORMAL LOW (ref 36.0–46.0)
Hemoglobin: 11.7 g/dL — ABNORMAL LOW (ref 12.0–15.0)
MCH: 29 pg (ref 26.0–34.0)
MCHC: 34.7 g/dL (ref 30.0–36.0)
MCV: 83.4 fL (ref 78.0–100.0)
Platelets: 317 10*3/uL (ref 150–400)
RBC: 4.04 MIL/uL (ref 3.87–5.11)
RDW: 18.7 % — ABNORMAL HIGH (ref 11.5–15.5)
WBC: 8.1 10*3/uL (ref 4.0–10.5)

## 2016-10-13 LAB — GLUCOSE, CAPILLARY
GLUCOSE-CAPILLARY: 99 mg/dL (ref 65–99)
GLUCOSE-CAPILLARY: 116 mg/dL — AB (ref 65–99)
Glucose-Capillary: 97 mg/dL (ref 65–99)

## 2016-10-13 LAB — TROPONIN I
TROPONIN I: 0.35 ng/mL — AB (ref <0.03)
TROPONIN I: 0.27 ng/mL — AB (ref <0.03)

## 2016-10-13 MED ORDER — POLYETHYLENE GLYCOL 3350 17 G PO PACK
17.0000 g | PACK | Freq: Every day | ORAL | Status: DC
  Administered 2016-10-13 – 2016-10-15 (×3): 17 g via ORAL
  Filled 2016-10-13 (×3): qty 1

## 2016-10-13 MED ORDER — ONDANSETRON HCL 4 MG PO TABS: 4.0000 mg | ORAL_TABLET | Freq: Four times a day (QID) | ORAL | Status: DC | PRN

## 2016-10-13 MED ORDER — MIDODRINE HCL 5 MG PO TABS
10.0000 mg | ORAL_TABLET | ORAL | Status: DC
  Administered 2016-10-13 – 2016-10-15 (×4): 10 mg via ORAL
  Filled 2016-10-13 (×3): qty 2

## 2016-10-13 MED ORDER — LIDOCAINE HCL (PF) 1 % IJ SOLN: 5.0000 mL | INTRAMUSCULAR | Status: DC | PRN

## 2016-10-13 MED ORDER — PANTOPRAZOLE SODIUM 40 MG PO TBEC
40.0000 mg | DELAYED_RELEASE_TABLET | Freq: Two times a day (BID) | ORAL | Status: DC
  Administered 2016-10-13 – 2016-10-15 (×5): 40 mg via ORAL
  Filled 2016-10-13 (×5): qty 1

## 2016-10-13 MED ORDER — ALTEPLASE 2 MG IJ SOLR: 2.0000 mg | Freq: Once | INTRAMUSCULAR | Status: DC | PRN

## 2016-10-13 MED ORDER — ONDANSETRON HCL 4 MG/2ML IJ SOLN: 4.0000 mg | Freq: Four times a day (QID) | INTRAMUSCULAR | Status: DC | PRN

## 2016-10-13 MED ORDER — LEVOTHYROXINE SODIUM 75 MCG PO TABS
75.0000 ug | ORAL_TABLET | Freq: Every day | ORAL | Status: DC
  Administered 2016-10-13 – 2016-10-15 (×3): 75 ug via ORAL
  Filled 2016-10-13 (×3): qty 1

## 2016-10-13 MED ORDER — MIDODRINE HCL 5 MG PO TABS
ORAL_TABLET | ORAL | Status: AC
Start: 2016-10-13 — End: 2016-10-13
  Filled 2016-10-13: qty 2

## 2016-10-13 MED ORDER — INSULIN ASPART 100 UNIT/ML ~~LOC~~ SOLN
0.0000 [IU] | Freq: Three times a day (TID) | SUBCUTANEOUS | Status: DC
Start: 2016-10-13 — End: 2016-10-15
  Administered 2016-10-14 – 2016-10-15 (×3): 1 [IU] via SUBCUTANEOUS

## 2016-10-13 MED ORDER — DOXERCALCIFEROL 4 MCG/2ML IV SOLN
INTRAVENOUS | Status: AC
  Filled 2016-10-13: qty 2

## 2016-10-13 MED ORDER — SODIUM CHLORIDE 0.9 % IV SOLN: 100.0000 mL | INTRAVENOUS | Status: DC | PRN

## 2016-10-13 MED ORDER — APIXABAN 2.5 MG PO TABS
2.5000 mg | ORAL_TABLET | Freq: Two times a day (BID) | ORAL | Status: DC
  Administered 2016-10-13 – 2016-10-15 (×5): 2.5 mg via ORAL
  Filled 2016-10-13 (×5): qty 1

## 2016-10-13 MED ORDER — NITROGLYCERIN 0.4 MG SL SUBL: 0.4000 mg | SUBLINGUAL_TABLET | SUBLINGUAL | Status: DC | PRN

## 2016-10-13 MED ORDER — MIRTAZAPINE 15 MG PO TABS
15.0000 mg | ORAL_TABLET | Freq: Every evening | ORAL | Status: DC | PRN
  Administered 2016-10-13 – 2016-10-14 (×2): 15 mg via ORAL
  Filled 2016-10-13 (×2): qty 1

## 2016-10-13 MED ORDER — HEPARIN SODIUM (PORCINE) 1000 UNIT/ML DIALYSIS: 1000.0000 [IU] | INTRAMUSCULAR | Status: DC | PRN

## 2016-10-13 MED ORDER — ATORVASTATIN CALCIUM 20 MG PO TABS
20.0000 mg | ORAL_TABLET | Freq: Every day | ORAL | Status: DC
  Administered 2016-10-13 – 2016-10-15 (×3): 20 mg via ORAL
  Filled 2016-10-13 (×3): qty 1

## 2016-10-13 MED ORDER — PENTAFLUOROPROP-TETRAFLUOROETH EX AERO: 1.0000 "application " | INHALATION_SPRAY | CUTANEOUS | Status: DC | PRN

## 2016-10-13 MED ORDER — LIDOCAINE-PRILOCAINE 2.5-2.5 % EX CREA: 1.0000 "application " | TOPICAL_CREAM | CUTANEOUS | Status: DC | PRN

## 2016-10-13 MED ORDER — DOXERCALCIFEROL 4 MCG/2ML IV SOLN
4.0000 ug | INTRAVENOUS | Status: DC
  Administered 2016-10-13: 4 ug via INTRAVENOUS

## 2016-10-13 MED ORDER — PROMETHAZINE HCL 25 MG PO TABS
ORAL_TABLET | ORAL | Status: AC
  Filled 2016-10-13: qty 1

## 2016-10-13 MED ORDER — PROMETHAZINE HCL 25 MG PO TABS
12.5000 mg | ORAL_TABLET | Freq: Four times a day (QID) | ORAL | Status: DC | PRN
  Administered 2016-10-13: 12.5 mg via ORAL

## 2016-10-13 NOTE — Evaluation (Signed)
Physical Therapy Evaluation Patient Details Name: Wendy Kerr MRN: [id number] DOB: 24-Aug-1930 Today's Date: 10/13/2016   History of Present Illness  Pt is an 83 yo female admitted through ED on 10/12/16 following a fall at dialysis. Pt's L knee gave out on her and she went down to the floor. Pt was brough to the ED for evaluation and is being worked up for near syncopal episode and hypotension. PMH significant for ESRD on HD, DM2, CKD3, OSA, A-flutter, CHF.   Clinical Impression  Pt presents with the above diagnosis and below deficits for therapy evaluation. Prior to admission, pt lived alone in a single level home with family available and checking in with her 24hrs a day. Pt's son provides transportation to HD. Pt requires Min-Mod A for all mobility this session due to fatigue and is only able to perform very short distance gait to recliner this session. Pt will benefit from continued acute PT follow-up in order to address the below deficits prior to D/C.     Follow Up Recommendations Home health PT;Supervision for mobility/OOB    Equipment Recommendations  None recommended by PT    Recommendations for Other Services OT consult     Precautions / Restrictions Precautions Precautions: Fall Restrictions Weight Bearing Restrictions: No      Mobility  Bed Mobility Overal bed mobility: Needs Assistance Bed Mobility: Supine to Sit     Supine to sit: Min guard     General bed mobility comments: Min guard for safety to get to EOB  Transfers Overall transfer level: Needs assistance Equipment used: Rolling walker (2 wheeled) Transfers: Sit to/from Stand Sit to Stand: Mod assist         General transfer comment: Mod A to boost up to standing. Min guard once in standing  Ambulation/Gait Ambulation/Gait assistance: Min assist Ambulation Distance (Feet): 40 Feet Assistive device: Rolling walker (2 wheeled) Gait Pattern/deviations: Step-through pattern;Decreased step length  - right;Decreased step length - left;Shuffle Gait velocity: decreased Gait velocity interpretation: Below normal speed for age/gender General Gait Details: Minimal shuffeling gait with mild antagic gait noted. Pt requires MIn A for safety with gait.   Stairs            Wheelchair Mobility    Modified Rankin (Stroke Patients Only)       Balance Overall balance assessment: Needs assistance Sitting-balance support: No upper extremity supported;Feet supported Sitting balance-Leahy Scale: Fair     Standing balance support: Bilateral upper extremity supported Standing balance-Leahy Scale: Poor Standing balance comment: reliant on UE support in standing                             Pertinent Vitals/Pain Pain Assessment: No/denies pain    Home Living Family/patient expects to be discharged to:: Private residence Living Arrangements: Alone Available Help at Discharge: Family;Available PRN/intermittently Type of Home: House Home Access: Stairs to enter;Ramped entrance Entrance Stairs-Rails: Right;Left;Can reach both Entrance Stairs-Number of Steps: 4 Home Layout: One level Home Equipment: Grab bars - tub/shower;Shower seat;Walker - 2 wheels      Prior Function Level of Independence: Independent with assistive device(s)   Gait / Transfers Assistance Needed: RW for mobility.            Hand Dominance   Dominant Hand: Right    Extremity/Trunk Assessment   Upper Extremity Assessment Upper Extremity Assessment: Generalized weakness    Lower Extremity Assessment Lower Extremity Assessment: Generalized weakness    Cervical /  Trunk Assessment Cervical / Trunk Assessment: Kyphotic  Communication   Communication: No difficulties  Cognition Arousal/Alertness: Awake/alert Behavior During Therapy: WFL for tasks assessed/performed Overall Cognitive Status: Within Functional Limits for tasks assessed                                         General Comments      Exercises     Assessment/Plan    PT Assessment Patient needs continued PT services  PT Problem List Decreased strength;Decreased activity tolerance;Decreased balance;Decreased mobility       PT Treatment Interventions DME instruction;Gait training;Stair training;Functional mobility training;Therapeutic activities;Therapeutic exercise;Balance training    PT Goals (Current goals can be found in the Care Plan section)  Acute Rehab PT Goals Patient Stated Goal: to feel better and get back home PT Goal Formulation: With patient Time For Goal Achievement: 10/27/16 Potential to Achieve Goals: Good    Frequency Min 3X/week   Barriers to discharge        Co-evaluation               AM-PAC PT "6 Clicks" Daily Activity  Outcome Measure Difficulty turning over in bed (including adjusting bedclothes, sheets and blankets)?: None Difficulty moving from lying on back to sitting on the side of the bed? : None Difficulty sitting down on and standing up from a chair with arms (e.g., wheelchair, bedside commode, etc,.)?: Unable Help needed moving to and from a bed to chair (including a wheelchair)?: A Lot Help needed walking in hospital room?: A Lot Help needed climbing 3-5 steps with a railing? : A Lot 6 Click Score: 15    End of Session Equipment Utilized During Treatment: Gait belt Activity Tolerance: Patient tolerated treatment well Patient left: in chair;with call bell/phone within reach;with chair alarm set Nurse Communication: Mobility status PT Visit Diagnosis: Unsteadiness on feet (R26.81);Muscle weakness (generalized) (M62.81);Difficulty in walking, not elsewhere classified (R26.2)    Time: 1740-8144 PT Time Calculation (min) (ACUTE ONLY): 19 min   Charges:   PT Evaluation $PT Eval Moderate Complexity: 1 Mod     PT G Codes:   PT G-Codes **NOT FOR INPATIENT CLASS** Functional Assessment Tool Used: AM-PAC 6 Clicks Basic Mobility;Clinical  judgement Functional Limitation: Mobility: Walking and moving around Mobility: Walking and Moving Around Current Status (Y1856): At least 40 percent but less than 60 percent impaired, limited or restricted Mobility: Walking and Moving Around Goal Status [id number]): At least 1 percent but less than 20 percent impaired, limited or restricted    Scheryl Marten PT, DPT  [phone]   Shanon Rosser 10/13/2016, 1:22 PM

## 2016-10-13 NOTE — Progress Notes (Signed)
Pt had a 19 beat run of VT. VS 97.7-78-19-106/65. O2 sat 100% via RA. Patient denies CP or SOB. Jeannette Corpus, NP paged and notified. Will continue to monitor.

## 2016-10-13 NOTE — Progress Notes (Signed)
PROGRESS NOTE    Wendy Kerr  MOQ:[id number] DOB: Apr 08, 1930 DOA: 10/12/2016 PCP: Larene Beach, MD    Brief Narrative:  83 year old female who presented after a mechanical fall. Patient is known to have end-stage renal disease on hemodialysis, (Monday Wednesday Friday), type 2 diabetes mellitus, atrial flutter, systolic heart failure. Patient developed acute left knee weakness, producing a mechanical fall with left face trauma, no loss of consciousness. Patient was brought to hospital for further evaluation, she missed her hemodialysis session. On initial physical examination, blood pressure 107/73, heart rate 91, respiratory rate 18, temperature 97.5, oxygen saturation 92%. She had moist mucous membranes, lungs were clear to auscultation bilaterally, no wheezing rales or rhonchi, heart S1-S2 present and rhythmic, abdomen was protuberant, no organomegaly, lower extremities with 2+ pitting edema bilaterally. Sodium 134, potassium 4.4, chloride 94, bicarbonate 24, glucose 139, BUN 34, creatinine 3.71, white count 9.3, hemoglobin 12.4, hematocrit 35.6, platelets 313, troponin 0.35. EKG with sinus arrhythmia, inferior lateral Q waves, prolonged QRS 150 ms, left axis deviation, left anterior fascicular block, first-degree AV block. Chronic changes compared to June 2018. Chest x-ray left rotation, poor inflation, cardiomegaly, no infiltrates. Right knee films with advanced osteoarthritis, with progression from prior study.  Working diagnosis, chemical fall likely due to left knee osteoarthritis to rule out near syncope episode, complicated by elevated troponins..  Assessment & Plan:   Principal Problem:   Fall Active Problems:   Diabetes mellitus type 2 in obese (HCC)   Chronic systolic heart failure (HCC)   Right bundle branch block   Atrial flutter (HCC)   End stage renal disease on dialysis ([place])   Arterial hypotension   1. Near syncope. Patient with no loss of consciousness, will  continue telemetry monitoring and neuro checks per unit protocol, no clinical features of orthostatic symptoms. EKG with low complexes, old records personally reviewed noted echocardiogram from 07/2016 with small to moderate pericardial effusion. Chest film with prominent cardiomegaly, will order a limited echocardiogram to rule out progression of pericardial effusion, likely uremic. Elevated troponin likely related to renal disease, no angina.   2. Right knee osteoarthritis. Likely the culprit of patient's collapse, will continue physical therapy evaluation, out of bed as tolerated. Pain control.   3. End-stage renal disease on hemodialysis. Patient will have hemodialysis today, per nephrology recommendations, clinically euvolemic. Continue midodrine on HD  5. Atrial flutter, paroxysmal. Rate controlled, will continue anticoagulation with apixaban with good toleration.   6. Type 2 diabetes mellitus. Will continue glucose cover and monitoring with insulin sliding scale, patient tolerating po well. No nausea or vomiting.   7. Hypothyroid. Will continue levothyroxine, per home regimen.  DVT prophylaxis: apixaban  Code Status: full  Family Communication:  Disposition Plan:    Consultants:   Nephrology   Procedures:     Antimicrobials:  Subjective: Patient feeling better, complains of left knee edema and tenderness worse with movement, no radiation, improved with analgesics, no other associated symptoms.   Objective: Vitals:   10/13/16 0230 10/13/16 0300 10/13/16 0557 10/13/16 1004  BP: 100/68 128/87 95/66 98/67   Pulse: 80 90 80 85  Resp:  20 18 18   Temp:  98 F (36.7 C) 97.6 F (36.4 C) (!) 97.4 F (36.3 C)  TempSrc:  Oral Oral Oral  SpO2: 100% 100% 96% 98%  Weight:  79.9 kg (176 lb 2.4 oz)    Height:  5\' 3"  (1.6 m)      Intake/Output Summary (Last 24 hours) at 10/13/16 1018 Last data filed  at 10/13/16 0900  Gross per 24 hour  Intake              240 ml  Output                 0 ml  Net              240 ml   Filed Weights   10/13/16 0300  Weight: 79.9 kg (176 lb 2.4 oz)    Examination:  General exam: not in pain or dyspnea E ENT: No pallor or icterus, oral mucosa moist.  Respiratory system: Clear to auscultation. Respiratory effort normal. Cardiovascular system: S1 & S2 heard, RRR. No JVD, murmurs, rubs, gallops or clicks. No pedal edema. Gastrointestinal system: Abdomen is nondistended, soft and nontender. No organomegaly or masses felt. Normal bowel sounds heard. Central nervous system: Alert and oriented. No focal neurological deficits. Extremities: Symmetric 5 x 5 power. Skin: No rashes, lesions or ulcers     Data Reviewed: I have personally reviewed following labs and imaging studies  CBC:  Recent Labs Lab 10/12/16 1706 10/13/16 0528  WBC 9.3 8.1  NEUTROABS 7.6  --   HGB 12.4 11.7*  HCT 35.6* 33.7*  MCV 84.2 83.4  PLT 313 673   Basic Metabolic Panel:  Recent Labs Lab 10/12/16 1706 10/13/16 0528  NA 134* 136  K 4.4 3.9  CL 94* 93*  CO2 24 27  GLUCOSE 139* 107*  BUN 34* 35*  CREATININE 3.71* 3.88*  CALCIUM 9.2 9.2  PHOS  --  2.1*   GFR: Estimated Creatinine Clearance: 10.8 mL/min (A) (by C-G formula based on SCr of 3.88 mg/dL (H)). Liver Function Tests:  Recent Labs Lab 10/12/16 1706 10/13/16 0528  AST 35  --   ALT 16  --   ALKPHOS 133*  --   BILITOT 1.0  --   PROT 7.4  --   ALBUMIN 3.0* 2.9*   No results for input(s): LIPASE, AMYLASE in the last 168 hours. No results for input(s): AMMONIA in the last 168 hours. Coagulation Profile: No results for input(s): INR, PROTIME in the last 168 hours. Cardiac Enzymes:  Recent Labs Lab 10/13/16 0319  TROPONINI 0.35*   BNP (last 3 results)  Recent Labs  11/09/15 1455 06/26/16 1424  PROBNP 1,559.0* 3,417.0*   HbA1C: No results for input(s): HGBA1C in the last 72 hours. CBG:  Recent Labs Lab 10/13/16 0536 10/13/16 0824  GLUCAP 97 99   Lipid  Profile: No results for input(s): CHOL, HDL, LDLCALC, TRIG, CHOLHDL, LDLDIRECT in the last 72 hours. Thyroid Function Tests: No results for input(s): TSH, T4TOTAL, FREET4, T3FREE, THYROIDAB in the last 72 hours. Anemia Panel: No results for input(s): VITAMINB12, FOLATE, FERRITIN, TIBC, IRON, RETICCTPCT in the last 72 hours. Sepsis Labs: No results for input(s): PROCALCITON, LATICACIDVEN in the last 168 hours.  No results found for this or any previous visit (from the past 240 hour(s)).       Radiology Studies: Dg Chest 2 View  Result Date: 10/13/2016 CLINICAL DATA:  No SOB, No pain in chest. CHF, non smoker, dry cough and itch in throat since January, HTN until she started dialysis now she has low blood pressure and takes medication to raise her BP. Diabetic. Insertion of dialysis catheter right side 07/20/2016. EXAM: CHEST  2 VIEW COMPARISON:  08/08/2016 FINDINGS: Moderate enlargement of the cardiopericardial silhouette stable from the prior exam. No mediastinal or hilar masses.  No convincing adenopathy. Clear lungs.  No pleural effusion  or pneumothorax. Right internal jugular tunneled catheter is stable, tip projecting in the right atrium. Skeletal structures are intact. IMPRESSION: 1. No acute cardiopulmonary disease. 2. Moderate cardiomegaly. Electronically Signed   By: Lajean Manes M.D.   On: 10/13/2016 09:40   Dg Knee 1-2 Views Left  Result Date: 10/13/2016 CLINICAL DATA:  Patient experiencing left knee problems. Yesterday after dialysis her left knee gave out on her and has been unable to stand since. Arthritis in left knee. No pain in left knee, just weak. No prior injuries, no prior surgeries to left knee. She has been experiencing stiffness in this knee for 1 year EXAM: LEFT KNEE - 1-2 VIEW COMPARISON:  04/18/2006 FINDINGS: No fracture.  No bone lesion. There is tricompartmental joint space narrowing with marginal osteophytes. This is greatest involving the medial compartment. There  is mild medial joint space compartment subchondral sclerosis. No convincing joint effusion. Bones are demineralized. IMPRESSION: 1. No fracture or acute finding. 2. Advanced osteoarthritis mildly progressed when compared to the prior study. Electronically Signed   By: Lajean Manes M.D.   On: 10/13/2016 09:39        Scheduled Meds: . apixaban  2.5 mg Oral BID  . atorvastatin  20 mg Oral q1800  . doxercalciferol  4 mcg Intravenous Q T,Th,Sa-HD  . levothyroxine  75 mcg Oral QAC breakfast  . midodrine  10 mg Oral 2 times per day on Mon Wed Fri Sat  . pantoprazole  40 mg Oral BID  . polyethylene glycol  17 g Oral Daily   Continuous Infusions:   LOS: 0 days        Mauricio Gerome Apley, MD [facility] Pager [phone]  If 7PM-7AM, please contact night-coverage [email or web address] Password [email or web address] 10/13/2016, 10:18 AM

## 2016-10-13 NOTE — Progress Notes (Signed)
PT brief Evaluation Note:  Pt seen for therapy evaluation. Pt requires Min A for sit to stand and Min guard for bed mobility and Min A for safety with short distance gait. Pt was independent with most ADLs and IADLs prior to admission. Recommending HHPT at discharge.   Complete PT evaluation to follow.  Scheryl Marten PT, DPT  [phone]

## 2016-10-13 NOTE — H&P (Addendum)
History and Physical    Wendy Kerr:[id number] DOB: 1930/12/11 DOA: 10/12/2016  PCP: Larene Beach, MD   Patient coming from: Home   Chief Complaint: Fall  HPI: Wendy Kerr is a 83 y.o. female with medical history significant of End-stage renal disease on HD MWFSat, DM2, CKD3, OSA, atrial flutter, systolic CHF. Patient presented today with complaints of a fall. Patient was about to start HD, when her left knee gave out from underneath her, she landed on her buttocks and hit the side of her face- he her lip but did not hit her head. Patient was conscious state of time unaware of the episode. She had no premonition that she was going to fall. Patient denies chest pain, no shortness of breath, but has chronic cough, worse over the past 2 months, no dizziness, no palpitations, no particular weakness of her extremities. No slurred speech no vision change no facial asymmetry, no bowel problems. No fever no chills, endorses cold by mouth intake . Does not make urine. Patient did not get HD but was brought to the emergency department. Patient reports abdominal tightness over the past week, without abdominal pain, no nausea no vomiting, last bowel movement was this a.m. Small.   ED Course: Blood pressure 107/73, mild drop to 92/80, but patient is chronically hypotensive on midodrine, O2 sats 96-100%, on room air ( spurious recordings of 70%), temperature mildly low at 97.5. Blood work showed stable hemoglobin at 12.4, WBC 9.3 platelets 313, sodium mildly low at 134, normal potassium 4.4, BUN 34, creatinine consistent with ESRD status 3.7, stable but chronically elevated ALP 133. Troponin was mildly elevated at 0.04. Hospitalist was called to admit for near syncope workup.  Review of Systems:  CONSTITUTIONAL- No Fever, weightloss, night sweat or change in appetite. SKIN- No Rash, colour changes or itching. HEAD- No Headache or dizziness. Mouth/throat- No Sorethroat, dentures, or bleeding  gums. RESPIRATORY- Chronic stable dry cough- 45months, seeing pulmonologist next week. CARDIAC- No Palpitations, or chest pain. URINARY- no urine. NEUROLOGIC- No Numbness, syncope, seizures or burning. [facility]- Denies depression or anxiety.  Past Medical History:  Diagnosis Date  . 1st degree AV block   . Anemia   . Atrial flutter ([place])   . Benign neoplasm of pancreas, except islets of Langerhans   . Bronchitis, mucopurulent recurrent ([place])   . Cataract    B/L  . CHF (congestive heart failure) ([place])   . Chronic kidney disease   . Cirrhosis of liver ([place])    " just a little"  . Common migraine   . Diabetes mellitus ([place])   . Diverticulosis of colon   . DJD (degenerative joint disease)   . Generalized anxiety disorder   . GERD (gastroesophageal reflux disease)   . Hypercholesterolemia   . Hypertension   . Monoclonal gammopathy   . Obesity   . OSA (obstructive sleep apnea)    severe with AHI 37 events per hour  . Vitamin D deficiency     Past Surgical History:  Procedure Laterality Date  . ABDOMINAL HYSTERECTOMY  1972  . AV FISTULA PLACEMENT Right 07/13/2016   Procedure: Creation Arteriovenous Fistula Right Arm;  Surgeon: Angelia Mould, MD;  Location: [place];  Service: Vascular;  Laterality: Right;  . COLONOSCOPY    . INSERTION OF DIALYSIS CATHETER Right 07/20/2016   Procedure: INSERTION OF TUNNELED DIALYSIS CATHETER - RIGHT INTERNAL JUGULAR;  Surgeon: Angelia Mould, MD;  Location: [place];  Service: Vascular;  Laterality: Right;  .  LAPAROTOMY N/A 12/23/2015   Procedure: EXPLORATORY LAPAROTOMY FOR SMALL BOWEL OBSTRUCTION;  Surgeon: Judeth Horn, MD;  Location: [place];  Service: General;  Laterality: N/A;  . resection serous cystadenoma of pancreas  2004   at [facility]  Now (2017) with Dr Bridgett Larsson with [facility]     reports that she has never smoked. She has never used smokeless tobacco. She reports that she does not drink alcohol or use drugs.  Allergies  Allergen  Reactions  . Piroxicam Other (See Comments)    REACTION: HEADACHE    Family History  Problem Relation Age of Onset  . Hypertension Mother   . Kidney failure Mother        dialysis  . Heart Problems Mother 35  . Hypertension Father   . Kidney failure Father        dialysis  . Heart Problems Father 70  . Kidney disease Sister   . Kidney disease Other     Prior to Admission medications   Medication Sig Start Date End Date Taking? Authorizing Provider  ELIQUIS 2.5 MG TABS tablet TAKE 1 TABLET BY MOUTH 2 TIMES DAILY. Patient taking differently: TAKE 2.5 MG BY MOUTH 2 TIMES DAILY. 07/18/16  Yes Belva Crome, MD  levothyroxine (SYNTHROID, LEVOTHROID) 75 MCG tablet Take 75 mcg by mouth daily before breakfast. 09/06/16  Yes [provider]  midodrine (PROAMATINE) 10 MG tablet Take 10 mg by mouth twice daily on Monday, Wednesday, Friday, and Saturday with hemodialysis. 08/14/16  Yes [provider]  midodrine (PROAMATINE) 5 MG tablet Take 5 mg by mouth twice daily on Monday, Wednesday, Friday, and Saturday with hemodialysis. 08/15/16  Yes [provider]  mirtazapine (REMERON) 15 MG tablet Take 15 mg by mouth at bedtime as needed (for itching or sleep).  06/29/16  Yes [provider]  Multiple Vitamin (MULTIVITAMIN WITH MINERALS) TABS tablet Take 1 tablet by mouth every morning.   Yes [provider]  oxyCODONE-acetaminophen (PERCOCET/ROXICET) 5-325 MG tablet Take 1 tablet by mouth every 4 (four) hours as needed for pain. 07/22/16  Yes [provider]  pantoprazole (PROTONIX) 40 MG tablet Take 40 mg by mouth 2 (two) times daily as needed (for acid reflux).  07/06/16  Yes [provider]  Vitamin D, Ergocalciferol, (DRISDOL) 50000 units CAPS capsule Take 1 capsule (50,000 Units total) by mouth every 7 (seven) days. Patient taking differently: Take 50,000 Units by mouth every Monday.  01/13/16  Yes Hoyt Koch, MD  atorvastatin  (LIPITOR) 20 MG tablet Take 1 tablet (20 mg total) by mouth daily at 6 PM. Patient not taking: Reported on 10/12/2016 11/23/15   Reino Bellis B, NP  IRON PO Take 1 tablet by mouth daily.     [provider]  nitroGLYCERIN (NITROSTAT) 0.4 MG SL tablet Place 1 tablet (0.4 mg total) under the tongue every 5 (five) minutes as needed for chest pain. 08/13/14   Hoyt Koch, MD  promethazine (PHENERGAN) 12.5 MG tablet Take 12.5 mg by mouth every 6 (six) hours as needed for nausea/vomiting. 07/20/16   [provider]    Physical Exam: Vitals:   10/12/16 1323 10/12/16 1930 10/12/16 2000  BP: 107/73 115/78 99/87  Pulse: 91 61 77  Resp: 18    Temp: (!) 97.5 F (36.4 C)    TempSrc: Oral    SpO2: 92% (!) 70% 90%    Constitutional: NAD, calm, comfortable Vitals:   10/12/16 1323 10/12/16 1930 10/12/16 2000  BP: 107/73 115/78  99/87  Pulse: 91 61 77  Resp: 18    Temp: (!) 97.5 F (36.4 C)    TempSrc: Oral    SpO2: 92% (!) 70% 90%   Eyes: Pupils equal round, minimally responsive to light, lids and conjunctivae normal ENMT: Mucous membranes are moist. Posterior pharynx clear of any exudate or lesions.Normal dentition.  Neck: normal, supple, no masses, no thyromegaly Respiratory: clear to auscultation bilaterally, no wheezing, no crackles. Normal respiratory effort. No accessory muscle use.  Cardiovascular: Regular rate and rhythm, no murmurs / rubs / gallops. 2+ pitting edema to mid leg- equal bilaterally chronic  Abdomen: Obese, no tenderness, no masses palpated. No hepatosplenomegaly. Bowel sounds positive.  Musculoskeletal: warm, well-perfused, no clubbing / cyanosis. No joint deformity upper and lower extremities. Good ROM, no contractures. Normal muscle tone.  Skin: no rashes, lesions, ulcers. No induration Neurologic: CN 2-12 grossly intact. Sensation intact, DTR normal. Strength 5/5 in all 4.  Psychiatric: Normal judgment and insight. Alert and oriented x 3.  Normal mood.   Labs on Admission: I have personally reviewed following labs and imaging studies  CBC:  Recent Labs Lab 10/12/16 1706  WBC 9.3  NEUTROABS 7.6  HGB 12.4  HCT 35.6*  MCV 84.2  PLT 272   Basic Metabolic Panel:  Recent Labs Lab 10/12/16 1706  NA 134*  K 4.4  CL 94*  CO2 24  GLUCOSE 139*  BUN 34*  CREATININE 3.71*  CALCIUM 9.2   Liver Function Tests:  Recent Labs Lab 10/12/16 1706  AST 35  ALT 16  ALKPHOS 133*  BILITOT 1.0  PROT 7.4  ALBUMIN 3.0*   BNP (last 3 results)  Recent Labs  11/09/15 1455 06/26/16 1424  PROBNP 1,559.0* 3,417.0*   Urine analysis:    Component Value Date/Time   COLORURINE YELLOW 12/18/2015 2239   APPEARANCEUR CLEAR 12/18/2015 2239   LABSPEC 1.011 12/18/2015 2239   PHURINE 5.5 12/18/2015 2239   GLUCOSEU NEGATIVE 12/18/2015 2239   GLUCOSEU Color Interference (A) 08/15/2015 1245   HGBUR SMALL (A) 12/18/2015 2239   BILIRUBINUR NEGATIVE 12/18/2015 2239   KETONESUR NEGATIVE 12/18/2015 2239   PROTEINUR 100 (A) 12/18/2015 2239   UROBILINOGEN Color Interference (A) 08/15/2015 1245   NITRITE NEGATIVE 12/18/2015 2239   LEUKOCYTESUR NEGATIVE 12/18/2015 2239    Radiological Exams on Admission: No results found.  EKG: Right bundle branch block with ventricular rate 101, no significant change from prior.  Assessment/Plan Principal Problem:   Fall Active Problems:   Diabetes mellitus type 2 in obese (HCC)   Chronic systolic heart failure (HCC)   Right bundle branch block   Atrial flutter (HCC)   End stage renal disease on dialysis ([place])   Arterial hypotension  FAll- patient denied symptoms to suggest syncope, or near syncope. She has had chronic problems with left knee. Reports left knee gave out. Most likely just osteoarthritic degen disease. No dizziness, despite soft but stable bp. - Admit to telemetry, obs - Left knee x-ray - PT evaluation  Elevated troponin- 0.4, in the setting of ESRD. Recent echo. No  chest pain or shortness of breath. Echo- 6/16- 40-45%, diffuse hypokinesis, G2DD, mild aortic valve thickening troponin mildly elevated at 0.04. Last admitted 6/13- 6/19- admission for volume overload, troponin was elevated at 0.8, cardiology recommended outpatient follow-up with her cardiologist Dr. Tamala Julian and felt troponin elevation was secondary to volume overload - Trops 3 - EKG a.m.  Atrial flutter- with right bundle branch block old- rate controlled. Stable Hgb. - Continue  home Eliquis  ESRD- MWFSAT. Started HD 3 months ago. Patient's hypotension limited the amount of fluid that can be pulled in HD. Abdominal tightness likely fluid overload, with chronic bilateral 2+ pitting pedal edema. No shortness of breath - Nephrology consulted in ED - Continue midodrine  DM- last hemoglobin A1c 08/09/16- 5.6, in the setting of ESRD. No on hypoglycemic agents - FAsting CBS  Chronic dry cough-  Worsening. no fever no chills no shortness of breath no leukocytosis. Has pulmonology appointment in 2 weeks. - Chest x-ray PA and lateral - Continue home PPI   DVT prophylaxis: Eliquis Code Status: Full, confirmed with Patient and patients son Sydna Brodowski at bedside Family Communication: Son at bedside- [name]. Disposition Plan: Home  Consults called: Nephrology Admission status: Obs, Tele   Bethena Roys MD [facility] Pager [phone]  If 2am- 7AM, please contact night-coverage [email or web address] Password [email or web address]  10/13/2016, 12:12 AM

## 2016-10-13 NOTE — Progress Notes (Signed)
[facility] Brief Note  Wendy Kerr is a 83 y.o. female with ESRD secondary to DM, HTN, cardiorenal syndrome. On HD since 06/2016. PMH includes atrial flutter on Eliquis, systolic CHF, Echo 05/9353 EF 40-45%, liver cirrhosis, chronic anasarca. She was brought to the ED yesterday after sustaining a fall at her dialysis center. She says she was about to sit when her knee gave out and she fell on her left side. She says she did not hit her head or lose consciousness. She did not have dialysis yesterday. No fractures on knee xray. No overt edema on CXR. Admitted under observation status  Today she denies any pain. She appears comfortable on RA. Endorses SOB, chronic cough, but no more than usual for her. Exam is notable for 2+ pitting edema from thighs to ankles. Abdomen distended, tight with fluid.   Dialyzes 4x week for additional volume removal. Has been compliant with treatments but UF limited by hypotension and is on midodrine for BP support. Last HD was 8/15 and left 5kg over EDW.   Dialysis Orders:  NW MWFSat 4h 160F BFR 350/500 2K/2Ca Na Linear EDW 71kg  R IJ TDC No heparin  -Hectorol 4 mcg IV q HD -Venofer 50 mg q HD   Plan HD today on schedule. UF goal 3L, midodrine prior to HD -Full consult if inpatient status   Ogechi Larina Earthly PA-C [place] Pager [phone] 10/13/2016,10:00 AM  I have seen and examined this patient and agree with plan per Larina Earthly.  Plan HD today Aalaya Yadao T,MD 10/13/2016 10:32 AM

## 2016-10-14 ENCOUNTER — Observation Stay (HOSPITAL_BASED_OUTPATIENT_CLINIC_OR_DEPARTMENT_OTHER): Payer: Medicare Other

## 2016-10-14 DIAGNOSIS — R748 Abnormal levels of other serum enzymes: Secondary | ICD-10-CM | POA: Diagnosis not present

## 2016-10-14 DIAGNOSIS — N186 End stage renal disease: Secondary | ICD-10-CM

## 2016-10-14 DIAGNOSIS — I4892 Unspecified atrial flutter: Secondary | ICD-10-CM

## 2016-10-14 DIAGNOSIS — Z992 Dependence on renal dialysis: Secondary | ICD-10-CM

## 2016-10-14 DIAGNOSIS — D472 Monoclonal gammopathy: Secondary | ICD-10-CM | POA: Diagnosis not present

## 2016-10-14 DIAGNOSIS — I361 Nonrheumatic tricuspid (valve) insufficiency: Secondary | ICD-10-CM | POA: Diagnosis not present

## 2016-10-14 DIAGNOSIS — I272 Pulmonary hypertension, unspecified: Secondary | ICD-10-CM

## 2016-10-14 DIAGNOSIS — K7469 Other cirrhosis of liver: Secondary | ICD-10-CM

## 2016-10-14 DIAGNOSIS — I5043 Acute on chronic combined systolic (congestive) and diastolic (congestive) heart failure: Secondary | ICD-10-CM

## 2016-10-14 DIAGNOSIS — R55 Syncope and collapse: Secondary | ICD-10-CM | POA: Diagnosis not present

## 2016-10-14 DIAGNOSIS — E1121 Type 2 diabetes mellitus with diabetic nephropathy: Secondary | ICD-10-CM | POA: Diagnosis not present

## 2016-10-14 DIAGNOSIS — L299 Pruritus, unspecified: Secondary | ICD-10-CM | POA: Diagnosis not present

## 2016-10-14 DIAGNOSIS — I313 Pericardial effusion (noninflammatory): Secondary | ICD-10-CM | POA: Diagnosis not present

## 2016-10-14 DIAGNOSIS — M1712 Unilateral primary osteoarthritis, left knee: Secondary | ICD-10-CM

## 2016-10-14 DIAGNOSIS — E039 Hypothyroidism, unspecified: Secondary | ICD-10-CM

## 2016-10-14 LAB — BASIC METABOLIC PANEL
Anion gap: 13 (ref 5–15)
BUN: 27 mg/dL — AB (ref 6–20)
CALCIUM: 8.9 mg/dL (ref 8.9–10.3)
CO2: 27 mmol/L (ref 22–32)
CREATININE: 3.49 mg/dL — AB (ref 0.44–1.00)
Chloride: 94 mmol/L — ABNORMAL LOW (ref 101–111)
GFR calc Af Amer: 13 mL/min — ABNORMAL LOW (ref >60)
GFR, EST NON AFRICAN AMERICAN: 11 mL/min — AB (ref >60)
Glucose, Bld: 123 mg/dL — ABNORMAL HIGH (ref 65–99)
Potassium: 3.7 mmol/L (ref 3.5–5.1)
SODIUM: 134 mmol/L — AB (ref 135–145)

## 2016-10-14 LAB — COMPREHENSIVE METABOLIC PANEL
ALT: 18 U/L (ref 14–54)
AST: 42 U/L — ABNORMAL HIGH (ref 15–41)
Albumin: 3 g/dL — ABNORMAL LOW (ref 3.5–5.0)
Alkaline Phosphatase: 130 U/L — ABNORMAL HIGH (ref 38–126)
Anion gap: 15 (ref 5–15)
BUN: 32 mg/dL — ABNORMAL HIGH (ref 6–20)
CHLORIDE: 93 mmol/L — AB (ref 101–111)
CO2: 24 mmol/L (ref 22–32)
CREATININE: 3.96 mg/dL — AB (ref 0.44–1.00)
Calcium: 9 mg/dL (ref 8.9–10.3)
GFR, EST AFRICAN AMERICAN: 11 mL/min — AB (ref >60)
GFR, EST NON AFRICAN AMERICAN: 10 mL/min — AB (ref >60)
Glucose, Bld: 86 mg/dL (ref 65–99)
POTASSIUM: 4.6 mmol/L (ref 3.5–5.1)
SODIUM: 132 mmol/L — AB (ref 135–145)
Total Bilirubin: 1.4 mg/dL — ABNORMAL HIGH (ref 0.3–1.2)
Total Protein: 7.7 g/dL (ref 6.5–8.1)

## 2016-10-14 LAB — CBC WITH DIFFERENTIAL/PLATELET
BASOS ABS: 0 10*3/uL (ref 0.0–0.1)
Basophils Relative: 0 %
EOS ABS: 0.1 10*3/uL (ref 0.0–0.7)
EOS PCT: 2 %
HCT: 34 % — ABNORMAL LOW (ref 36.0–46.0)
Hemoglobin: 11.6 g/dL — ABNORMAL LOW (ref 12.0–15.0)
Lymphocytes Relative: 12 %
Lymphs Abs: 0.9 10*3/uL (ref 0.7–4.0)
MCH: 28.2 pg (ref 26.0–34.0)
MCHC: 34.1 g/dL (ref 30.0–36.0)
MCV: 82.7 fL (ref 78.0–100.0)
Monocytes Absolute: 0.9 10*3/uL (ref 0.1–1.0)
Monocytes Relative: 12 %
Neutro Abs: 5.3 10*3/uL (ref 1.7–7.7)
Neutrophils Relative %: 74 %
PLATELETS: 228 10*3/uL (ref 150–400)
RBC: 4.11 MIL/uL (ref 3.87–5.11)
RDW: 18.7 % — AB (ref 11.5–15.5)
WBC: 7.2 10*3/uL (ref 4.0–10.5)

## 2016-10-14 LAB — MAGNESIUM: MAGNESIUM: 2.1 mg/dL (ref 1.7–2.4)

## 2016-10-14 LAB — GLUCOSE, CAPILLARY
GLUCOSE-CAPILLARY: 126 mg/dL — AB (ref 65–99)
GLUCOSE-CAPILLARY: 121 mg/dL — AB (ref 65–99)
GLUCOSE-CAPILLARY: 51 mg/dL — AB (ref 65–99)
GLUCOSE-CAPILLARY: 61 mg/dL — AB (ref 65–99)
GLUCOSE-CAPILLARY: 66 mg/dL (ref 65–99)
GLUCOSE-CAPILLARY: 116 mg/dL — AB (ref 65–99)
Glucose-Capillary: 109 mg/dL — ABNORMAL HIGH (ref 65–99)

## 2016-10-14 LAB — HEPATITIS B CORE ANTIBODY, IGM: Hep B C IgM: NEGATIVE

## 2016-10-14 LAB — HEPATITIS B SURFACE ANTIGEN: HEP B S AG: NEGATIVE

## 2016-10-14 LAB — ECHOCARDIOGRAM LIMITED
HEIGHTINCHES: 63 in
WEIGHTICAEL: 2765.45 [oz_av]

## 2016-10-14 LAB — HEPATITIS B SURFACE ANTIBODY,QUALITATIVE: HEP B S AB: NONREACTIVE

## 2016-10-14 MED ORDER — CALCIUM ACETATE (PHOS BINDER) 667 MG PO CAPS
1334.0000 mg | ORAL_CAPSULE | Freq: Three times a day (TID) | ORAL | Status: DC
  Administered 2016-10-14 – 2016-10-15 (×3): 1334 mg via ORAL
  Filled 2016-10-14 (×3): qty 2

## 2016-10-14 MED ORDER — RENA-VITE PO TABS
1.0000 | ORAL_TABLET | Freq: Every day | ORAL | Status: DC
  Administered 2016-10-14: 1 via ORAL
  Filled 2016-10-14: qty 1

## 2016-10-14 MED ORDER — PRO-STAT SUGAR FREE PO LIQD
30.0000 mL | Freq: Two times a day (BID) | ORAL | Status: DC
  Administered 2016-10-14 – 2016-10-15 (×3): 30 mL via ORAL
  Filled 2016-10-14 (×3): qty 30

## 2016-10-14 MED ORDER — GLUCOSE 40 % PO GEL
ORAL | Status: AC
  Administered 2016-10-14: 37.5 g
  Filled 2016-10-14: qty 1

## 2016-10-14 MED ORDER — HYDROXYZINE HCL 25 MG PO TABS
25.0000 mg | ORAL_TABLET | Freq: Once | ORAL | Status: AC
  Administered 2016-10-14: 25 mg via ORAL
  Filled 2016-10-14: qty 1

## 2016-10-14 NOTE — Progress Notes (Signed)
[facility] Progress Note   Subjective:  C/o itching this morning, hydroxyzine dosed  Tolerated HD yesterday with net UF 3L  Thinks may go home today   Objective Vitals:   10/13/16 1724 10/13/16 1830 10/13/16 2126 10/14/16 0405  BP: (!) 93/50 108/70 106/65 112/69  Pulse: 70 69 78 84  Resp: 18 18 19 20   Temp: 98.4 F (36.9 C) 98.3 F (36.8 C) 97.7 F (36.5 C) 98.6 F (37 C)  TempSrc: Oral Oral Oral Oral  SpO2: 98% 94% 100% 99%  Weight: 78 kg (171 lb 15.3 oz)  78.4 kg (172 lb 13.5 oz)   Height:       Physical Exam General: Chronically ill appearing AAF NAD  Heart: RRR +S4 Lungs: diminished but CTAB  Abdomen: BS+ slight distention, taught NT Extremities: 1+ pitting edemea BLE  Dialysis Access: R IJ TDC   Dialysis Orders:  [facility] MWFSat 4h 160F BFR 350/500 2K/2Ca Na Linear EDW 71kg  R IJ TDC No heparin  -Hectorol 4 mcg IV q HD -Venofer 50 mg q HD   Assessment/Plan: 1. Fall - s/p mechanical fall on 8/17 at HD center, No LOC No fx on knee xray. Per primary    2. ESRD - MWFS - Plan HD tomorrow if not discharged today. K 3.7 Use 4K bath  3. Anemia - Hgb 11.6, No ESA yet  4. MBD-  Continue VDRA/ Check P with renal panel tomorrow  5. Hypotension/volume - On midodrine for HD support, takes pre HD and mid treatment/ Chronic volume overload/anascara - continue UF as tolerated  6. Nutrition - Renal diet/vitamins/ prostat for low albumin  7. H/o Aflutter - on Eliquis 8. DM  9. Chronic pruritis    Lynnda Child PA-C [facility] Pager [phone] 10/14/2016,8:52 AM  LOS: 0 days  I have seen and examined this patient and agree with plan per Larina Earthly.  Will plan HD tomorrow if still here. Aariona Momon T,MD 10/14/2016 9:36 AM Additional Objective Labs: Basic Metabolic Panel:  Recent Labs Lab 10/12/16 1706 10/13/16 0528 10/14/16 0356  NA 134* 136 134*  K 4.4 3.9 3.7  CL 94* 93* 94*  CO2 24 27 27   GLUCOSE 139* 107* 123*  BUN  34* 35* 27*  CREATININE 3.71* 3.88* 3.49*  CALCIUM 9.2 9.2 8.9  PHOS  --  2.1*  --    Liver Function Tests:  Recent Labs Lab 10/12/16 1706 10/13/16 0528  AST 35  --   ALT 16  --   ALKPHOS 133*  --   BILITOT 1.0  --   PROT 7.4  --   ALBUMIN 3.0* 2.9*   No results for input(s): LIPASE, AMYLASE in the last 168 hours. CBC:  Recent Labs Lab 10/12/16 1706 10/13/16 0528 10/14/16 0356  WBC 9.3 8.1 7.2  NEUTROABS 7.6  --  5.3  HGB 12.4 11.7* 11.6*  HCT 35.6* 33.7* 34.0*  MCV 84.2 83.4 82.7  PLT 313 317 228   Blood Culture    Component Value Date/Time   SDES FLUID PERITONEAL 11/15/2015 1207   SDES FLUID PERITONEAL 11/15/2015 1207   SPECREQUEST NONE 11/15/2015 1207   SPECREQUEST NONE 11/15/2015 1207   CULT  11/15/2015 1207    NO GROWTH 5 DAYS Performed at [facility] 11/20/2015 FINAL 11/15/2015 1207   REPTSTATUS 11/15/2015 FINAL 11/15/2015 1207    Cardiac Enzymes:  Recent Labs Lab 10/13/16 0319 10/13/16 0929  TROPONINI 0.35* 0.27*   CBG:  Recent  Labs Lab 10/13/16 0536 10/13/16 0824 10/13/16 2056 10/14/16 0812  GLUCAP 97 99 116* 126*   Iron Studies: No results for input(s): IRON, TIBC, TRANSFERRIN, FERRITIN in the last 72 hours. Lab Results  Component Value Date   INR 1.27 07/20/2016   INR 1.24 07/13/2016   INR 1.17 06/21/2014   Medications:  . apixaban  2.5 mg Oral BID  . atorvastatin  20 mg Oral q1800  . doxercalciferol  4 mcg Intravenous Q T,Th,Sa-HD  . insulin aspart  0-9 Units Subcutaneous TID WC  . levothyroxine  75 mcg Oral QAC breakfast  . midodrine  10 mg Oral 2 times per day on Mon Wed Fri Sat  . pantoprazole  40 mg Oral BID  . polyethylene glycol  17 g Oral Daily

## 2016-10-14 NOTE — Progress Notes (Signed)
  Echocardiogram  has been performed.  [name] 10/14/2016, 10:57 AM

## 2016-10-14 NOTE — Progress Notes (Signed)
PROGRESS NOTE    Wendy Kerr  JEH:[id number] DOB: 1930-10-31 DOA: 10/12/2016 PCP: Larene Beach, MD     Brief Narrative:  83 y.o. BF PMHx Generalized anxiety disorder, ESRD on HD M/W/F/Sat, DM type 2, OSA, Atrial flutter, Chronic Systolic CHF. HTN, HLD,Cirrhosis of liver , Monoclonal gammopathy  Patient presented today with complaints of a fall. Patient was about to start HD, when her left knee gave out from underneath her, she landed on her buttocks and hit the side of her face- he her lip but did not hit her head. Patient was conscious state of time unaware of the episode. She had no premonition that she was going to fall. Patient denies chest pain, no shortness of breath, but has chronic cough, worse over the past 2 months, no dizziness, no palpitations, no particular weakness of her extremities. No slurred speech no vision change no facial asymmetry, no bowel problems. No fever no chills, endorses cold by mouth intake . Does not make urine. Patient did not get HD but was brought to the emergency department. Patient reports abdominal tightness over the past week, without abdominal pain, no nausea no vomiting, last bowel movement was this a.m. Small.     Subjective: 8/19  A/O 4, positive increased WOB, positive SOB, negative CP, negative abdominal pain. Positive puritis. States has been on HD for~3 months. Started having increasing generalized itching~6 weeks ago. Patient also admits to Liver cirrhosis   Assessment & Plan:   Principal Problem:   Fall Active Problems:   Diabetes mellitus type 2 in obese (HCC)   Chronic systolic heart failure (HCC)   Right bundle branch block   Atrial flutter (HCC)   End stage renal disease on dialysis ([place])   Arterial hypotension   Near syncope.  -Patient story not consistent with syncope. More consistent with musculoskeletal failure. Patient has arthritis in the left knee which is the one that buckled as she attempted to step into HD  chair.  Acute on Chronic Systolic and Diastolic CHF -Echocardiogram shows worsening CHF. Contact Cardiology/CHF team in the A.m. -Requested complete echocardiogram: Require accurate measurement of pericardial effusion, increased pulmonary hypertension? Increased diastolic dysfunction? -Strict in and out -Daily weight -Eliquis 2.5 mg BID -BP to soft for traditional CHF medication.  Pulmonary hypertension -See CHF  Pericardial effusion -Patient with moderate pericardial effusion on echocardiogram June 2018. Increasing cough/episodes of SOB. Increasing pericardial effusion? Echocardiogram pending. -If pericardial effusion has enlarged may require pericardiocentesis/pericardial window  Atrial flutter, paroxysmal.  Rate controlled, will continue anticoagulation with apixaban with good toleration.   Elevated troponin -Most likely secondary to demand ischemia and renal failure -Will trend  Increased WOB -Multifactorial worsening CHF, MGUS, ESRD -See CHF, see MGUS -Titrate O2 to maintain SPO2> 92% -Ambulatory SPO2 pending  Liver cirrhosis -Unknown amount of liver function remaining. Obtain CMP, fractionated Bilirubin -Severely elevated bilirubin would account for patient's generalized pruritus  Monoclonal gammopathy(MGUS) -May also account for patient's skin changes (scleroderma?), pruritus -SPEP/UPEP pending     Left knee osteoarthritis.  -Likely the culprit of patient's collapse, will continue physical therapy evaluation, out of bed as tolerated. Pain control.    ESRD on HD M/W/F/Sat,  -HD per nephrology   Diabetes Type 2 controlled with renal complications  - Sensitive SSI   Hypothyroid.  -Synthroid 75 g daily   Generalized Pruritus  -Atarax 25 mg 1 -See liver cirrhosis, MGUS     DVT prophylaxis: Eliquis Code Status: Full Family Communication: Sons who are former HD Techs present  at bedside to discuss plan of care Disposition Plan: Per CHF  team,   Consultants:  Nephrology    Procedures/Significant Events:  07/2016 Echocardiogram:Left ventricle:  severe LVH. -LVEF 40% to 45%. Diffuse hypokinesis. -Grade 2 diastolic dysfunction - Left atrium: moderately dilated.-- Right ventricle: mildly to moderately dilated.  - Right atrium: moderately dilated. - Tricuspid valve: moderate-severe regurgitation. - Pulmonary arteries: PA  peak pressure: 37 mm Hg (S). - Pericardium, extracardiac: There is a small to moderate   pericardial effusion no evidence of tamponade physiology. 8/19 Echocardiogram:Left ventricle:severe LVH. -LVEF  25% to 30%. - Tricuspid valve: moderate regurgitation. - Pericardium, extracardiac: small to moderate pericardial effusion     I have personally reviewed and interpreted all radiology studies and my findings are as above.  VENTILATOR SETTINGS: None   Cultures None  Antimicrobials: Anti-infectives    None       Devices    LINES / TUBES:  Right IJ Vas-Cath>>    Continuous Infusions:   Objective: Vitals:   10/13/16 1724 10/13/16 1830 10/13/16 2126 10/14/16 0405  BP: (!) 93/50 108/70 106/65 112/69  Pulse: 70 69 78 84  Resp: 18 18 19 20   Temp: 98.4 F (36.9 C) 98.3 F (36.8 C) 97.7 F (36.5 C) 98.6 F (37 C)  TempSrc: Oral Oral Oral Oral  SpO2: 98% 94% 100% 99%  Weight: 171 lb 15.3 oz (78 kg)  172 lb 13.5 oz (78.4 kg)   Height:        Intake/Output Summary (Last 24 hours) at 10/14/16 0801 Last data filed at 10/14/16 6226  Gross per 24 hour  Intake              720 ml  Output             3001 ml  Net            -2281 ml   Filed Weights   10/13/16 1324 10/13/16 1724 10/13/16 2126  Weight: 178 lb 9.2 oz (81 kg) 171 lb 15.3 oz (78 kg) 172 lb 13.5 oz (78.4 kg)    Examination:  General: A/O 4, positive acute respiratory distress, increased WOB Eyes: negative scleral hemorrhage, negative anisocoria, negative icterus Neck:  Negative scars, masses, torticollis,  lymphadenopathy, JVD Lungs: bibasilar crackles, negative wheezing,  Cardiovascular: Distant heart sounds (concerning for increased pericardial effusion), Regular rate and rhythm without murmur gallop or rub normal S1 and S2 Abdomen: negative abdominal pain, nondistended, positive soft, bowel sounds, no rebound, no ascites, no appreciable mass Extremities: No significant cyanosis, clubbing, or edema bilateral lower extremities Skin: Patient with dark patches on face arms, (scleroderma?) Psychiatric:  Negative depression, negative anxiety, negative fatigue, negative mania  Central nervous system:  Cranial nerves II through XII intact, tongue/uvula midline, all extremities muscle strength 5/5, sensation intact throughout,  negative dysarthria, negative expressive aphasia, negative receptive aphasia.  .     Data Reviewed: Care during the described time interval was provided by me .  I have reviewed this patient's available data, including medical history, events of note, physical examination, and all test results as part of my evaluation.  CBC:  Recent Labs Lab 10/12/16 1706 10/13/16 0528 10/14/16 0356  WBC 9.3 8.1 7.2  NEUTROABS 7.6  --  5.3  HGB 12.4 11.7* 11.6*  HCT 35.6* 33.7* 34.0*  MCV 84.2 83.4 82.7  PLT 313 317 333   Basic Metabolic Panel:  Recent Labs Lab 10/12/16 1706 10/13/16 0528 10/14/16 0356  NA 134* 136 134*  K  4.4 3.9 3.7  CL 94* 93* 94*  CO2 24 27 27   GLUCOSE 139* 107* 123*  BUN 34* 35* 27*  CREATININE 3.71* 3.88* 3.49*  CALCIUM 9.2 9.2 8.9  MG  --   --  2.1  PHOS  --  2.1*  --    GFR: Estimated Creatinine Clearance: 11.9 mL/min (A) (by C-G formula based on SCr of 3.49 mg/dL (H)). Liver Function Tests:  Recent Labs Lab 10/12/16 1706 10/13/16 0528  AST 35  --   ALT 16  --   ALKPHOS 133*  --   BILITOT 1.0  --   PROT 7.4  --   ALBUMIN 3.0* 2.9*   No results for input(s): LIPASE, AMYLASE in the last 168 hours. No results for input(s): AMMONIA in  the last 168 hours. Coagulation Profile: No results for input(s): INR, PROTIME in the last 168 hours. Cardiac Enzymes:  Recent Labs Lab 10/13/16 0319 10/13/16 0929  TROPONINI 0.35* 0.27*   BNP (last 3 results)  Recent Labs  11/09/15 1455 06/26/16 1424  PROBNP 1,559.0* 3,417.0*   HbA1C: No results for input(s): HGBA1C in the last 72 hours. CBG:  Recent Labs Lab 10/13/16 0536 10/13/16 0824 10/13/16 2056  GLUCAP 97 99 116*   Lipid Profile: No results for input(s): CHOL, HDL, LDLCALC, TRIG, CHOLHDL, LDLDIRECT in the last 72 hours. Thyroid Function Tests: No results for input(s): TSH, T4TOTAL, FREET4, T3FREE, THYROIDAB in the last 72 hours. Anemia Panel: No results for input(s): VITAMINB12, FOLATE, FERRITIN, TIBC, IRON, RETICCTPCT in the last 72 hours. Sepsis Labs: No results for input(s): PROCALCITON, LATICACIDVEN in the last 168 hours.  No results found for this or any previous visit (from the past 240 hour(s)).       Radiology Studies: Dg Chest 2 View  Result Date: 10/13/2016 CLINICAL DATA:  No SOB, No pain in chest. CHF, non smoker, dry cough and itch in throat since January, HTN until she started dialysis now she has low blood pressure and takes medication to raise her BP. Diabetic. Insertion of dialysis catheter right side 07/20/2016. EXAM: CHEST  2 VIEW COMPARISON:  08/08/2016 FINDINGS: Moderate enlargement of the cardiopericardial silhouette stable from the prior exam. No mediastinal or hilar masses.  No convincing adenopathy. Clear lungs.  No pleural effusion or pneumothorax. Right internal jugular tunneled catheter is stable, tip projecting in the right atrium. Skeletal structures are intact. IMPRESSION: 1. No acute cardiopulmonary disease. 2. Moderate cardiomegaly. Electronically Signed   By: Lajean Manes M.D.   On: 10/13/2016 09:40   Dg Knee 1-2 Views Left  Result Date: 10/13/2016 CLINICAL DATA:  Patient experiencing left knee problems. Yesterday after  dialysis her left knee gave out on her and has been unable to stand since. Arthritis in left knee. No pain in left knee, just weak. No prior injuries, no prior surgeries to left knee. She has been experiencing stiffness in this knee for 1 year EXAM: LEFT KNEE - 1-2 VIEW COMPARISON:  04/18/2006 FINDINGS: No fracture.  No bone lesion. There is tricompartmental joint space narrowing with marginal osteophytes. This is greatest involving the medial compartment. There is mild medial joint space compartment subchondral sclerosis. No convincing joint effusion. Bones are demineralized. IMPRESSION: 1. No fracture or acute finding. 2. Advanced osteoarthritis mildly progressed when compared to the prior study. Electronically Signed   By: Lajean Manes M.D.   On: 10/13/2016 09:39        Scheduled Meds: . apixaban  2.5 mg Oral BID  . atorvastatin  20 mg Oral q1800  . doxercalciferol  4 mcg Intravenous Q T,Th,Sa-HD  . insulin aspart  0-9 Units Subcutaneous TID WC  . levothyroxine  75 mcg Oral QAC breakfast  . midodrine  10 mg Oral 2 times per day on Mon Wed Fri Sat  . pantoprazole  40 mg Oral BID  . polyethylene glycol  17 g Oral Daily   Continuous Infusions:   LOS: 0 days    Time spent:40 min    Titania Gault, Geraldo Docker, MD [facility] Pager [phone]  If 7PM-7AM, please contact night-coverage [email or web address] Password [email or web address] 10/14/2016, 8:01 AM

## 2016-10-14 NOTE — Care Management Obs Status (Signed)
[facility] NOTIFICATION   Patient Details  Name: Wendy Kerr MRN: [id number] Date of Birth: [birth year]   Medicare Observation Status Notification Given:  Yes    CrutchfieldAntony Haste, RN 10/14/2016, 4:51 PM

## 2016-10-14 NOTE — Discharge Instructions (Signed)

## 2016-10-15 ENCOUNTER — Other Ambulatory Visit: Payer: Self-pay

## 2016-10-15 DIAGNOSIS — R55 Syncope and collapse: Secondary | ICD-10-CM | POA: Diagnosis not present

## 2016-10-15 DIAGNOSIS — E1169 Type 2 diabetes mellitus with other specified complication: Secondary | ICD-10-CM | POA: Diagnosis not present

## 2016-10-15 DIAGNOSIS — I9589 Other hypotension: Secondary | ICD-10-CM | POA: Diagnosis not present

## 2016-10-15 DIAGNOSIS — N185 Chronic kidney disease, stage 5: Secondary | ICD-10-CM

## 2016-10-15 DIAGNOSIS — M25562 Pain in left knee: Secondary | ICD-10-CM

## 2016-10-15 DIAGNOSIS — I484 Atypical atrial flutter: Secondary | ICD-10-CM | POA: Diagnosis not present

## 2016-10-15 DIAGNOSIS — G8929 Other chronic pain: Secondary | ICD-10-CM

## 2016-10-15 LAB — GLUCOSE, CAPILLARY
GLUCOSE-CAPILLARY: 111 mg/dL — AB (ref 65–99)
GLUCOSE-CAPILLARY: 129 mg/dL — AB (ref 65–99)

## 2016-10-15 LAB — BASIC METABOLIC PANEL
Anion gap: 12 (ref 5–15)
BUN: 38 mg/dL — AB (ref 6–20)
CHLORIDE: 95 mmol/L — AB (ref 101–111)
CO2: 26 mmol/L (ref 22–32)
Calcium: 9.2 mg/dL (ref 8.9–10.3)
Creatinine, Ser: 4.16 mg/dL — ABNORMAL HIGH (ref 0.44–1.00)
GFR calc Af Amer: 10 mL/min — ABNORMAL LOW (ref >60)
GFR calc non Af Amer: 9 mL/min — ABNORMAL LOW (ref >60)
Glucose, Bld: 111 mg/dL — ABNORMAL HIGH (ref 65–99)
POTASSIUM: 4.2 mmol/L (ref 3.5–5.1)
SODIUM: 133 mmol/L — AB (ref 135–145)

## 2016-10-15 LAB — TROPONIN I: TROPONIN I: 0.23 ng/mL — AB (ref <0.03)

## 2016-10-15 LAB — MAGNESIUM: MAGNESIUM: 2 mg/dL (ref 1.7–2.4)

## 2016-10-15 MED ORDER — LIDOCAINE-PRILOCAINE 2.5-2.5 % EX CREA: 1.0000 "application " | TOPICAL_CREAM | CUTANEOUS | Status: DC | PRN

## 2016-10-15 MED ORDER — SODIUM CHLORIDE 0.9 % IV SOLN: 100.0000 mL | INTRAVENOUS | Status: DC | PRN

## 2016-10-15 MED ORDER — HEPARIN SODIUM (PORCINE) 1000 UNIT/ML DIALYSIS: 1000.0000 [IU] | INTRAMUSCULAR | Status: DC | PRN

## 2016-10-15 MED ORDER — LIDOCAINE HCL (PF) 1 % IJ SOLN: 5.0000 mL | INTRAMUSCULAR | Status: DC | PRN

## 2016-10-15 MED ORDER — ALTEPLASE 2 MG IJ SOLR: 2.0000 mg | Freq: Once | INTRAMUSCULAR | Status: DC | PRN

## 2016-10-15 MED ORDER — PRO-STAT SUGAR FREE PO LIQD
30.0000 mL | Freq: Two times a day (BID) | ORAL | 0 refills | Status: DC
Start: 2016-10-15 — End: 2016-10-18

## 2016-10-15 MED ORDER — PENTAFLUOROPROP-TETRAFLUOROETH EX AERO: 1.0000 "application " | INHALATION_SPRAY | CUTANEOUS | Status: DC | PRN

## 2016-10-15 MED ORDER — CALCIUM ACETATE (PHOS BINDER) 667 MG PO CAPS: 1334.0000 mg | ORAL_CAPSULE | Freq: Three times a day (TID) | ORAL | 0 refills | Status: AC

## 2016-10-15 NOTE — Discharge Summary (Signed)
Physician Discharge Summary  Wendy Kerr OIN:[id number] DOB: [birth year] DOA: 10/12/2016  PCP: Larene Beach, MD  Admit date: 10/12/2016 Discharge date: 10/15/2016  Admitted From: Home  Disposition:  Home   Recommendations for Outpatient Follow-up:  1. Follow up with PCP in 1- week   Home Health: no  Equipment/Devices: No   Discharge Condition: stable CODE STATUS:Full  Diet recommendation: renal   Brief/Interim Summary: 83 year old female who presented after a mechanical fall. Patient is known to have end-stage renal disease on hemodialysis, (Monday Wednesday Friday), type 2 diabetes mellitus, atrial flutter, systolic heart failure. Patient developed acute left knee weakness, producing a mechanical fall with left face trauma, no loss of consciousness. Patient was brought to hospital for further evaluation, she missed her hemodialysis session. On initial physical examination, blood pressure 107/73, heart rate 91, respiratory rate 18, temperature 97.5, oxygen saturation 92%. She had moist mucous membranes, lungs were clear to auscultation bilaterally, no wheezing rales or rhonchi, heart S1-S2 present and rhythmic, abdomen was protuberant, no organomegaly, lower extremities with 2+ pitting edema bilaterally. Sodium 134, potassium 4.4, chloride 94, bicarbonate 24, glucose 139, BUN 34, creatinine 3.71, white count 9.3, hemoglobin 12.4, hematocrit 35.6, platelets 313, troponin 0.35. EKG with sinus arrhythmia, inferior lateral Q waves, prolonged QRS 150 ms, left axis deviation, left anterior fascicular block, first-degree AV block. Chronic changes compared to June 2018. Chest x-ray left rotation, poor inflation, cardiomegaly, no infiltrates. Right knee films with advanced osteoarthritis, with progression from prior study.  Working diagnosis, mechanical fall likely due to left knee osteoarthritis to rule out near syncope episode, complicated by elevated troponins  1. Near syncope and mechanical  fall. Patient was admitted to the medical unit with remote telemetry monitor, she did not loss her consciousness. She remained hemodynamically stable. Repeat echocardiography showed left ventricle ejection fraction 25-30%, the pericardial effusion is stable, reported as small-to-moderate in size. Note that the myocardium was bright, suggestive of infiltrative process. Patient will continue taking midodrine on hemodialysis, unable to use beta blockade or ACE inhibitor due to risk of hypotension. Ultrafiltration on hemodialysis. Peak troponin 0.27, likely related to renal failure.   2. Systolic heart failure. Chronic and stable. Noted reduction in LV systolic function from 40 to 45% to 25 to 30%. Questionable infiltrative process. Will follow up with Dr. Tamala Julian in the outpatient cardiology clinic. She has deemed to have a poor prognosis due to muscle wasting, weight loss and frailty.   3. Right knee osteoarthritis. No significant joint pain, patient was seen by physical therapy, recommendations for home health PT.   4. End-stage renal disease on hemodialysis. Patient underwent hemodialysis with good toleration, no significant hypotension.  5. Atrial flutter, paroxysmal. Continue anticoagulation with apixaban.  6. Hypothyroid. Patient will continue levothyroxine.   Discharge Diagnoses:  Principal Problem:   Fall Active Problems:   Diabetes mellitus type 2 in obese [facility])   Chronic systolic heart failure (HCC)   Right bundle branch block   Atrial flutter (HCC)   End stage renal disease on dialysis [facility])   Arterial hypotension    Discharge Instructions   Allergies as of 10/15/2016      Reactions   Piroxicam Other (See Comments)   REACTION: HEADACHE      Medication List    STOP taking these medications   atorvastatin 20 MG tablet Commonly known as:  LIPITOR     TAKE these medications   calcium acetate 667 MG capsule Commonly known as:  PHOSLO Take 2 capsules (1,334 mg total)  by  mouth 3 (three) times daily with meals.   ELIQUIS 2.5 MG Tabs tablet Generic drug:  apixaban TAKE 1 TABLET BY MOUTH 2 TIMES DAILY. What changed:  See the new instructions.   feeding supplement (PRO-STAT SUGAR FREE 64) Liqd Take 30 mLs by mouth 2 (two) times daily.   IRON PO Take 1 tablet by mouth daily.   levothyroxine 75 MCG tablet Commonly known as:  SYNTHROID, LEVOTHROID Take 75 mcg by mouth daily before breakfast.   midodrine 10 MG tablet Commonly known as:  PROAMATINE Take 10 mg by mouth twice daily on Monday, Wednesday, Friday, and Saturday with hemodialysis.   midodrine 5 MG tablet Commonly known as:  PROAMATINE Take 5 mg by mouth twice daily on Monday, Wednesday, Friday, and Saturday with hemodialysis.   mirtazapine 15 MG tablet Commonly known as:  REMERON Take 15 mg by mouth at bedtime as needed (for itching or sleep).   multivitamin with minerals Tabs tablet Take 1 tablet by mouth every morning.   nitroGLYCERIN 0.4 MG SL tablet Commonly known as:  NITROSTAT Place 1 tablet (0.4 mg total) under the tongue every 5 (five) minutes as needed for chest pain.   oxyCODONE-acetaminophen 5-325 MG tablet Commonly known as:  PERCOCET/ROXICET Take 1 tablet by mouth every 4 (four) hours as needed for pain.   pantoprazole 40 MG tablet Commonly known as:  PROTONIX Take 40 mg by mouth 2 (two) times daily as needed (for acid reflux).   promethazine 12.5 MG tablet Commonly known as:  PHENERGAN Take 12.5 mg by mouth every 6 (six) hours as needed for nausea/vomiting.   Vitamin D (Ergocalciferol) 50000 units Caps capsule Commonly known as:  DRISDOL Take 1 capsule (50,000 Units total) by mouth every 7 (seven) days. What changed:  when to take this       Allergies  Allergen Reactions  . Piroxicam Other (See Comments)    REACTION: HEADACHE    Consultations:  Nephrology    Procedures/Studies: Dg Chest 2 View  Result Date: 10/13/2016 CLINICAL DATA:  No SOB, No pain  in chest. CHF, non smoker, dry cough and itch in throat since January, HTN until she started dialysis now she has low blood pressure and takes medication to raise her BP. Diabetic. Insertion of dialysis catheter right side 07/20/2016. EXAM: CHEST  2 VIEW COMPARISON:  08/08/2016 FINDINGS: Moderate enlargement of the cardiopericardial silhouette stable from the prior exam. No mediastinal or hilar masses.  No convincing adenopathy. Clear lungs.  No pleural effusion or pneumothorax. Right internal jugular tunneled catheter is stable, tip projecting in the right atrium. Skeletal structures are intact. IMPRESSION: 1. No acute cardiopulmonary disease. 2. Moderate cardiomegaly. Electronically Signed   By: Lajean Manes M.D.   On: 10/13/2016 09:40   Dg Knee 1-2 Views Left  Result Date: 10/13/2016 CLINICAL DATA:  Patient experiencing left knee problems. Yesterday after dialysis her left knee gave out on her and has been unable to stand since. Arthritis in left knee. No pain in left knee, just weak. No prior injuries, no prior surgeries to left knee. She has been experiencing stiffness in this knee for 1 year EXAM: LEFT KNEE - 1-2 VIEW COMPARISON:  04/18/2006 FINDINGS: No fracture.  No bone lesion. There is tricompartmental joint space narrowing with marginal osteophytes. This is greatest involving the medial compartment. There is mild medial joint space compartment subchondral sclerosis. No convincing joint effusion. Bones are demineralized. IMPRESSION: 1. No fracture or acute finding. 2. Advanced osteoarthritis mildly progressed when  compared to the prior study. Electronically Signed   By: Lajean Manes M.D.   On: 10/13/2016 09:39       Subjective: Patient feeling better, no nausea or vomiting, tolerating HD well. No significant knee pain.   Discharge Exam: Vitals:   10/15/16 1100 10/15/16 1110  BP: (!) 101/54 103/65  Pulse: 97 82  Resp:  18  Temp:  98 F (36.7 C)  SpO2:  100%   Vitals:   10/15/16 1000  10/15/16 1030 10/15/16 1100 10/15/16 1110  BP: 107/61 102/62 (!) 101/54 103/65  Pulse: 81 92 97 82  Resp:    18  Temp:    98 F (36.7 C)  TempSrc:    Oral  SpO2:    100%  Weight:    75 kg (165 lb 5.5 oz)  Height:        General: Pt is alert, awake, not in acute distress E ENT. Mild pallor, oral mucosa moist. Cardiovascular: RRR, S1/S2 +, no rubs, no gallops Respiratory: CTA bilaterally, no wheezing, no rhonchi Abdominal: Soft, NT, ND, bowel sounds + Extremities: no edema, no cyanosis. Left knee hypertrophic, non tender, no erythema.     The results of significant diagnostics from this hospitalization (including imaging, microbiology, ancillary and laboratory) are listed below for reference.     Microbiology: No results found for this or any previous visit (from the past 240 hour(s)).   Labs: BNP (last 3 results)  Recent Labs  12/22/15 1855 08/09/16 0021  BNP 2,045.4* >2,426.8*   Basic Metabolic Panel:  Recent Labs Lab 10/12/16 1706 10/13/16 0528 10/14/16 0356 10/14/16 1745 10/15/16 0452  NA 134* 136 134* 132* 133*  K 4.4 3.9 3.7 4.6 4.2  CL 94* 93* 94* 93* 95*  CO2 24 27 27 24 26   GLUCOSE 139* 107* 123* 86 111*  BUN 34* 35* 27* 32* 38*  CREATININE 3.71* 3.88* 3.49* 3.96* 4.16*  CALCIUM 9.2 9.2 8.9 9.0 9.2  MG  --   --  2.1  --  2.0  PHOS  --  2.1*  --   --   --    Liver Function Tests:  Recent Labs Lab 10/12/16 1706 10/13/16 0528 10/14/16 1745  AST 35  --  42*  ALT 16  --  18  ALKPHOS 133*  --  130*  BILITOT 1.0  --  1.4*  PROT 7.4  --  7.7  ALBUMIN 3.0* 2.9* 3.0*   No results for input(s): LIPASE, AMYLASE in the last 168 hours. No results for input(s): AMMONIA in the last 168 hours. CBC:  Recent Labs Lab 10/12/16 1706 10/13/16 0528 10/14/16 0356  WBC 9.3 8.1 7.2  NEUTROABS 7.6  --  5.3  HGB 12.4 11.7* 11.6*  HCT 35.6* 33.7* 34.0*  MCV 84.2 83.4 82.7  PLT 313 317 228   Cardiac Enzymes:  Recent Labs Lab 10/13/16 0319  10/13/16 0929 10/15/16 0452  TROPONINI 0.35* 0.27* 0.23*   BNP: Invalid input(s): POCBNP CBG:  Recent Labs Lab 10/14/16 1728 10/14/16 1742 10/14/16 1917 10/14/16 2037 10/15/16 1213  GLUCAP 51* 66 116* 109* 111*   D-Dimer No results for input(s): DDIMER in the last 72 hours. Hgb A1c No results for input(s): HGBA1C in the last 72 hours. Lipid Profile No results for input(s): CHOL, HDL, LDLCALC, TRIG, CHOLHDL, LDLDIRECT in the last 72 hours. Thyroid function studies No results for input(s): TSH, T4TOTAL, T3FREE, THYROIDAB in the last 72 hours.  Invalid input(s): FREET3 Anemia work up No results for  input(s): VITAMINB12, FOLATE, FERRITIN, TIBC, IRON, RETICCTPCT in the last 72 hours. Urinalysis    Component Value Date/Time   COLORURINE YELLOW 12/18/2015 2239   APPEARANCEUR CLEAR 12/18/2015 2239   LABSPEC 1.011 12/18/2015 2239   PHURINE 5.5 12/18/2015 2239   GLUCOSEU NEGATIVE 12/18/2015 2239   GLUCOSEU Color Interference (A) 08/15/2015 1245   HGBUR SMALL (A) 12/18/2015 2239   BILIRUBINUR NEGATIVE 12/18/2015 2239   KETONESUR NEGATIVE 12/18/2015 2239   PROTEINUR 100 (A) 12/18/2015 2239   UROBILINOGEN Color Interference (A) 08/15/2015 1245   NITRITE NEGATIVE 12/18/2015 2239   LEUKOCYTESUR NEGATIVE 12/18/2015 2239   Sepsis Labs Invalid input(s): PROCALCITONIN,  WBC,  LACTICIDVEN Microbiology No results found for this or any previous visit (from the past 240 hour(s)).   Time coordinating discharge: 45 minutes  SIGNED:   Tawni Millers, MD  [facility] 10/15/2016, 3:17 PM Pager [phone]  If 7PM-7AM, please contact night-coverage [email or web address] Password [email or web address]

## 2016-10-15 NOTE — Progress Notes (Signed)
Lab called and is questioning a order pertaining to a specimen that is to be collected. MD notified. MD stated that order must have been mistake and that he would D/C that order. Lab notified. Will continue to monitor.

## 2016-10-15 NOTE — Care Management Note (Signed)
Case Management Note  Patient Details  Name: Wendy Kerr MRN: [id number] Date of Birth: 1930/09/17  Subjective/Objective:     CM following for progression and d/c planning.                Action/Plan: 10/15/2016 Spoke with pt son, Leiloni Smithers, [facility] services to be provided by [facility]. Maria with [facility] confirmed that they will provide HHPT and HHOT to begin Wednesday, 10/17/2016. No further DME needs.   Expected Discharge Date:  10/15/16               Expected Discharge Plan:  [place]  In-House Referral:  NA  Discharge planning Services  CM Consult  Post Acute Care Choice:  Home Health Choice offered to:  Adult Children  DME Arranged:  N/A DME Agency:  NA  HH Arranged:  OT, PT [place] Agency:  [facility] (now [facility])  Status of Service:  Completed, signed off  If discussed at [place] of Stay Meetings, dates discussed:    Additional Comments:  Adron Bene, RN 10/15/2016, 4:24 PM

## 2016-10-15 NOTE — Progress Notes (Signed)
PT Cancellation Note  Patient Details Name: MARYKAY MCCLEOD MRN: [id number] DOB: 10-25-1930   Cancelled Treatment:    Reason Eval/Treat Not Completed: Patient at procedure or test/unavailable   Currently in HD;  Will follow up later today as time allows;  Otherwise, will follow up for PT tomorrow;   Thank you,  Roney Marion, PT  Acute Rehabilitation Services Pager [phone] Office [place] 10/15/2016, 10:32 AM

## 2016-10-15 NOTE — Patient Outreach (Signed)
[facility]) Care Management  10/15/16  KHILYNN BORNTREGER June 12, 1930 [id number]  RNCM received notification that patient no longer has a [facility] provider and is no longer eligible for [facility] services. RNCM to perform case closure.  Will not send a case closure letter as [facility] sent unable to reach you letter on 10/05/2016 with plans for closure on within 10 business days and RNCM has not had any communication since sending the letter.    [place] R. Judaea Burgoon, RN, BSN, [place] Management Coordinator [phone]

## 2016-10-15 NOTE — Progress Notes (Signed)
[facility] Progress Note   Subjective:  No c/o', on HD  Objective Vitals:   10/15/16 1000 10/15/16 1030 10/15/16 1100 10/15/16 1110  BP: 107/61 102/62 (!) 101/54 103/65  Pulse: 81 92 97 82  Resp:    18  Temp:    98 F (36.7 C)  TempSrc:    Oral  SpO2:    100%  Weight:    75 kg (165 lb 5.5 oz)  Height:       Physical Exam General: Chronically ill appearing AAF NAD  Heart: RRR +S4 Lungs: diminished but CTAB  Abdomen: soft ntnd Extremities: trace edemea BLE  Dialysis Access: R IJ TDC   Dialysis Orders:  NW MWFSat 4h 160F BFR 350/500 2K/2Ca Na Linear EDW 71kg  R IJ TDC No heparin  -Hectorol 4 mcg IV q HD -Venofer 50 mg q HD   Assessment/Plan: 1. Fall - s/p mechanical fall on 8/17 at HD center, No LOC No fx on knee xray. Per primary    2. ESRD - MWFS.  HD today 3. Anemia - Hgb 11.6, No ESA yet  4. MBD-  Continue VDRA 5. Hypotension/volume - On midodrine for HD support, takes pre HD and mid treatment/ Chronic volume overload/anascara - continue UF as tolerated  6. Nutrition - Renal diet/vitamins/ prostat for low albumin  7. H/o Aflutter - on Eliquis 8. DM  9. Chronic pruritis    Kelly Splinter MD [facility] pgr [phone]   10/15/2016, 1:32 PM    Additional Objective Labs: Basic Metabolic Panel:  Recent Labs Lab 10/13/16 0528 10/14/16 0356 10/14/16 1745 10/15/16 0452  NA 136 134* 132* 133*  K 3.9 3.7 4.6 4.2  CL 93* 94* 93* 95*  CO2 27 27 24 26   GLUCOSE 107* 123* 86 111*  BUN 35* 27* 32* 38*  CREATININE 3.88* 3.49* 3.96* 4.16*  CALCIUM 9.2 8.9 9.0 9.2  PHOS 2.1*  --   --   --    Liver Function Tests:  Recent Labs Lab 10/12/16 1706 10/13/16 0528 10/14/16 1745  AST 35  --  42*  ALT 16  --  18  ALKPHOS 133*  --  130*  BILITOT 1.0  --  1.4*  PROT 7.4  --  7.7  ALBUMIN 3.0* 2.9* 3.0*   No results for input(s): LIPASE, AMYLASE in the last 168 hours. CBC:  Recent Labs Lab 10/12/16 1706 10/13/16 0528  10/14/16 0356  WBC 9.3 8.1 7.2  NEUTROABS 7.6  --  5.3  HGB 12.4 11.7* 11.6*  HCT 35.6* 33.7* 34.0*  MCV 84.2 83.4 82.7  PLT 313 317 228   Blood Culture    Component Value Date/Time   SDES FLUID PERITONEAL 11/15/2015 1207   SDES FLUID PERITONEAL 11/15/2015 1207   SPECREQUEST NONE 11/15/2015 1207   SPECREQUEST NONE 11/15/2015 1207   CULT  11/15/2015 1207    NO GROWTH 5 DAYS Performed at [facility] 11/20/2015 FINAL 11/15/2015 1207   REPTSTATUS 11/15/2015 FINAL 11/15/2015 1207    Cardiac Enzymes:  Recent Labs Lab 10/13/16 0319 10/13/16 0929 10/15/16 0452  TROPONINI 0.35* 0.27* 0.23*   CBG:  Recent Labs Lab 10/14/16 1728 10/14/16 1742 10/14/16 1917 10/14/16 2037 10/15/16 1213  GLUCAP 51* 66 116* 109* 111*   Iron Studies: No results for input(s): IRON, TIBC, TRANSFERRIN, FERRITIN in the last 72 hours. Lab Results  Component Value Date   INR 1.27 07/20/2016   INR 1.24 07/13/2016   INR  1.17 06/21/2014   Medications: . sodium chloride    . sodium chloride     . apixaban  2.5 mg Oral BID  . atorvastatin  20 mg Oral q1800  . calcium acetate  1,334 mg Oral TID WC  . doxercalciferol  4 mcg Intravenous Q T,Th,Sa-HD  . feeding supplement (PRO-STAT SUGAR FREE 64)  30 mL Oral BID  . insulin aspart  0-9 Units Subcutaneous TID WC  . levothyroxine  75 mcg Oral QAC breakfast  . midodrine  10 mg Oral 2 times per day on Mon Wed Fri Sat  . multivitamin  1 tablet Oral QHS  . pantoprazole  40 mg Oral BID  . polyethylene glycol  17 g Oral Daily

## 2016-10-15 NOTE — Consult Note (Addendum)
   [facility] Inpatient Consult   10/15/2016  MAZEL VILLELA 12/13/1930 [id number]    Ms. Uriegas was recently followed by [place] Management program. Please see chart review then encounters for further patient outreach details.  Went to bedside to discuss desire for ongoing [place] Management follow up. Spoke with Ms. Killings briefly. She advised Probation officer to contact her son, [name].  Telephone call made to son [name] at [phone]. Had an extensive conversation with [name]. He endorses that his mother now has Medicaid as secondary and he has been trying to follow up with the new Medicaid case worker regarding CAPS.States he plans on calling the Medicaid case worker again today regarding CAPS status.   [name] also states his mother will need home health services at discharge. Discussed that the inpatient RNCM will make home health arrangements. Expresses understanding.  Discussed ongoing [place] Management follow up. [name] states he recently changed Ms. Folkert's Primary Care Provider from Dr. Sharlet Salina to Dr. Larene Beach who is with [facility]. Cross checked and Dr. Loa Socks is not a [facility] Provider. Made Randy aware of this. He expresses understanding that [place] Management can no longer follow due to patient no longer having a [facility] Provider.  Darnelle Spangle to contact [facility] to change Primary Care Provider to Dr. Loa Socks on the insurance card. He is agreeable to this.  Appreciative of call and assistance that was provided thus far by [place] Management team.   Made inpatient [facility] aware of all of above.  Will make [place] Management team aware as well.    Marthenia Rolling, MSN-Ed, RN,BSN [facility] Liaison [phone]

## 2016-10-15 NOTE — Progress Notes (Signed)
Malachy Mood to be D/C'd Home per MD order.  Discussed prescriptions and follow up appointments with the patient. Prescriptions given to patient, medication list explained in detail. Pt verbalized understanding.  Allergies as of 10/15/2016      Reactions   Piroxicam Other (See Comments)   REACTION: HEADACHE      Medication List    STOP taking these medications   atorvastatin 20 MG tablet Commonly known as:  LIPITOR     TAKE these medications   calcium acetate 667 MG capsule Commonly known as:  PHOSLO Take 2 capsules (1,334 mg total) by mouth 3 (three) times daily with meals.   ELIQUIS 2.5 MG Tabs tablet Generic drug:  apixaban TAKE 1 TABLET BY MOUTH 2 TIMES DAILY. What changed:  See the new instructions.   feeding supplement (PRO-STAT SUGAR FREE 64) Liqd Take 30 mLs by mouth 2 (two) times daily.   IRON PO Take 1 tablet by mouth daily.   levothyroxine 75 MCG tablet Commonly known as:  SYNTHROID, LEVOTHROID Take 75 mcg by mouth daily before breakfast.   midodrine 10 MG tablet Commonly known as:  PROAMATINE Take 10 mg by mouth twice daily on Monday, Wednesday, Friday, and Saturday with hemodialysis.   midodrine 5 MG tablet Commonly known as:  PROAMATINE Take 5 mg by mouth twice daily on Monday, Wednesday, Friday, and Saturday with hemodialysis.   mirtazapine 15 MG tablet Commonly known as:  REMERON Take 15 mg by mouth at bedtime as needed (for itching or sleep).   multivitamin with minerals Tabs tablet Take 1 tablet by mouth every morning.   nitroGLYCERIN 0.4 MG SL tablet Commonly known as:  NITROSTAT Place 1 tablet (0.4 mg total) under the tongue every 5 (five) minutes as needed for chest pain.   oxyCODONE-acetaminophen 5-325 MG tablet Commonly known as:  PERCOCET/ROXICET Take 1 tablet by mouth every 4 (four) hours as needed for pain.   pantoprazole 40 MG tablet Commonly known as:  PROTONIX Take 40 mg by mouth 2 (two) times daily as needed (for acid  reflux).   promethazine 12.5 MG tablet Commonly known as:  PHENERGAN Take 12.5 mg by mouth every 6 (six) hours as needed for nausea/vomiting.   Vitamin D (Ergocalciferol) 50000 units Caps capsule Commonly known as:  DRISDOL Take 1 capsule (50,000 Units total) by mouth every 7 (seven) days. What changed:  when to take this       Vitals:   10/15/16 1110 10/15/16 1742  BP: 103/65 (!) 94/58  Pulse: 82 84  Resp: 18 18  Temp: 98 F (36.7 C) 97.8 F (36.6 C)  SpO2: 100% 100%    Skin clean, dry and intact without evidence of skin break down, no evidence of skin tears noted. IV catheter discontinued intact. Site without signs and symptoms of complications. Dressing and pressure applied. Pt denies pain at this time. No complaints noted.  An After Visit Summary was printed and given to the patient. Patient escorted via [place], and D/C home via private auto.  Dixie Dials RN, BSN

## 2016-10-16 ENCOUNTER — Other Ambulatory Visit (HOSPITAL_COMMUNITY): Payer: Medicare Other

## 2016-10-16 LAB — PROTEIN ELECTROPHORESIS, SERUM
A/G RATIO SPE: 0.8 (ref 0.7–1.7)
ALBUMIN ELP: 3.5 g/dL (ref 2.9–4.4)
Alpha-1-Globulin: 0.3 g/dL (ref 0.0–0.4)
Alpha-2-Globulin: 1 g/dL (ref 0.4–1.0)
BETA GLOBULIN: 1 g/dL (ref 0.7–1.3)
Gamma Globulin: 2 g/dL — ABNORMAL HIGH (ref 0.4–1.8)
Globulin, Total: 4.3 g/dL — ABNORMAL HIGH (ref 2.2–3.9)
M-Spike, %: 1.2 g/dL — ABNORMAL HIGH
TOTAL PROTEIN ELP: 7.8 g/dL (ref 6.0–8.5)

## 2016-10-17 DIAGNOSIS — Z23 Encounter for immunization: Secondary | ICD-10-CM | POA: Diagnosis not present

## 2016-10-17 DIAGNOSIS — N185 Chronic kidney disease, stage 5: Secondary | ICD-10-CM | POA: Diagnosis not present

## 2016-10-17 DIAGNOSIS — N2581 Secondary hyperparathyroidism of renal origin: Secondary | ICD-10-CM | POA: Diagnosis not present

## 2016-10-17 DIAGNOSIS — N186 End stage renal disease: Secondary | ICD-10-CM | POA: Diagnosis not present

## 2016-10-17 DIAGNOSIS — D472 Monoclonal gammopathy: Secondary | ICD-10-CM | POA: Diagnosis not present

## 2016-10-17 DIAGNOSIS — D631 Anemia in chronic kidney disease: Secondary | ICD-10-CM | POA: Diagnosis not present

## 2016-10-17 DIAGNOSIS — D649 Anemia, unspecified: Secondary | ICD-10-CM | POA: Diagnosis not present

## 2016-10-18 ENCOUNTER — Ambulatory Visit (HOSPITAL_COMMUNITY)
Admission: RE | Admit: 2016-10-18 | Discharge: 2016-10-18 | Disposition: A | Discharge status: Home / Self Care | Payer: Medicare Other | Source: Ambulatory Visit | Attending: Surgery | Admitting: Surgery

## 2016-10-18 ENCOUNTER — Ambulatory Visit (INDEPENDENT_AMBULATORY_CARE_PROVIDER_SITE_OTHER): Payer: Medicare Other | Admitting: Vascular Surgery

## 2016-10-18 ENCOUNTER — Encounter: Payer: Self-pay | Admitting: Vascular Surgery

## 2016-10-18 VITALS — BP 110/86 | HR 80 | Temp 96.7°F | Resp 20 | Ht 63.0 in | Wt 169.0 lb

## 2016-10-18 DIAGNOSIS — Z992 Dependence on renal dialysis: Secondary | ICD-10-CM | POA: Diagnosis not present

## 2016-10-18 DIAGNOSIS — R9439 Abnormal result of other cardiovascular function study: Secondary | ICD-10-CM | POA: Insufficient documentation

## 2016-10-18 DIAGNOSIS — I878 Other specified disorders of veins: Secondary | ICD-10-CM | POA: Diagnosis not present

## 2016-10-18 DIAGNOSIS — N186 End stage renal disease: Secondary | ICD-10-CM

## 2016-10-18 NOTE — Progress Notes (Signed)
Patient name: Wendy Kerr MRN: [id number] DOB: 11/26/1930 Sex: female  REASON FOR VISIT:    Non-maturing AV fistula. A consult is requested by Dr. Jamal Maes.  HPI:   Wendy Kerr is a pleasant 83 y.o. female who had a right radiocephalic fistula placed on 07/13/2016. The vein was only 3 mm in diameter. Given that she had stage IV chronic kidney disease we were asked not to place a graft if the vein were not adequate. On 07/20/2016 the patient had placement of a right IJ tunneled dialysis catheter.  Since I saw the patient last she denies any pain or paresthesias in her right arm. She did fall just last week and was in the hospital for several days. She has a history of congestive heart failure and states that her blood pressure has been running low. She dialyzes on Monday Wednesdays Fridays and Saturdays with her tunneled dialysis catheter.  She does admit to some dyspnea on exertion.  Past Medical History:  Diagnosis Date  . 1st degree AV block   . Anemia   . Atrial flutter ([place])   . Benign neoplasm of pancreas, except islets of Langerhans   . Bronchitis, mucopurulent recurrent ([place])   . Cataract    B/L  . CHF (congestive heart failure) ([place])   . Chronic kidney disease   . Cirrhosis of liver ([place])    " just a little"  . Common migraine   . Diabetes mellitus ([place])   . Diverticulosis of colon   . DJD (degenerative joint disease)   . Generalized anxiety disorder   . GERD (gastroesophageal reflux disease)   . Hypercholesterolemia   . Hypertension   . Monoclonal gammopathy   . Obesity   . OSA (obstructive sleep apnea)    severe with AHI 37 events per hour  . Vitamin D deficiency     Family History  Problem Relation Age of Onset  . Hypertension Mother   . Kidney failure Mother        dialysis  . Heart Problems Mother 67  . Hypertension Father   . Kidney failure Father        dialysis  . Heart Problems Father 11  . Kidney disease Sister   . Kidney  disease Other     SOCIAL HISTORY: Social History  Substance Use Topics  . Smoking status: Never Smoker  . Smokeless tobacco: Never Used  . Alcohol use No    Allergies  Allergen Reactions  . Piroxicam Other (See Comments)    REACTION: HEADACHE    Current Outpatient Prescriptions  Medication Sig Dispense Refill  . Amino Acids (AMINO ACID PO) Take by mouth.    . calcium acetate (PHOSLO) 667 MG capsule Take 2 capsules (1,334 mg total) by mouth 3 (three) times daily with meals. 180 capsule 0  . cholestyramine light (PREVALITE) 4 GM/DOSE powder Take by mouth 3 (three) times daily with meals.    Marland Kitchen ELIQUIS 2.5 MG TABS tablet TAKE 1 TABLET BY MOUTH 2 TIMES DAILY. (Patient taking differently: TAKE 2.5 MG BY MOUTH 2 TIMES DAILY.) 60 tablet 11  . IRON PO Take 1 tablet by mouth daily.     Marland Kitchen levothyroxine (SYNTHROID, LEVOTHROID) 75 MCG tablet Take 75 mcg by mouth daily before breakfast.  5  . midodrine (PROAMATINE) 10 MG tablet Take 10 mg by mouth twice daily on Monday, Wednesday, Friday, and Saturday with hemodialysis.    . midodrine (PROAMATINE) 5 MG tablet Take 5 mg by mouth  twice daily on Monday, Wednesday, Friday, and Saturday with hemodialysis.    Marland Kitchen mirtazapine (REMERON) 15 MG tablet Take 15 mg by mouth at bedtime as needed (for itching or sleep).   2  . Multiple Vitamin (MULTIVITAMIN WITH MINERALS) TABS tablet Take 1 tablet by mouth every morning.    . nitroGLYCERIN (NITROSTAT) 0.4 MG SL tablet Place 1 tablet (0.4 mg total) under the tongue every 5 (five) minutes as needed for chest pain. 5 tablet 3  . oxyCODONE-acetaminophen (PERCOCET/ROXICET) 5-325 MG tablet Take 1 tablet by mouth every 4 (four) hours as needed for pain.  0  . pantoprazole (PROTONIX) 40 MG tablet Take 40 mg by mouth 2 (two) times daily as needed (for acid reflux).     . promethazine (PHENERGAN) 12.5 MG tablet Take 12.5 mg by mouth every 6 (six) hours as needed for nausea/vomiting.  0  . Vitamin D, Ergocalciferol,  (DRISDOL) 50000 units CAPS capsule Take 1 capsule (50,000 Units total) by mouth every 7 (seven) days. (Patient taking differently: Take 50,000 Units by mouth every Monday. ) 12 capsule 3   No current facility-administered medications for this visit.     REVIEW OF SYSTEMS:  [X]  denotes positive finding, [ ]  denotes negative finding Cardiac  Comments:  Chest pain or chest pressure:    Shortness of breath upon exertion: X   Short of breath when lying flat:    Irregular heart rhythm: X       Vascular    Pain in calf, thigh, or hip brought on by ambulation:    Pain in feet at night that wakes you up from your sleep:     Blood clot in your veins:    Leg swelling:         Pulmonary    Oxygen at home:    Productive cough:  X   Wheezing:         Neurologic    Sudden weakness in arms or legs:     Sudden numbness in arms or legs:     Sudden onset of difficulty speaking or slurred speech:    Temporary loss of vision in one eye:     Problems with dizziness:         Gastrointestinal    Blood in stool:     Vomited blood:         Genitourinary    Burning when urinating:     Blood in urine:        Psychiatric    Major depression:         Hematologic    Bleeding problems:    Problems with blood clotting too easily:        Skin    Rashes or ulcers:        Constitutional    Fever or chills:     PHYSICAL EXAM:   Vitals:   10/18/16 1615  BP: 110/86  Pulse: 80  Resp: 20  Temp: (!) 96.7 F (35.9 C)  TempSrc: Oral  SpO2: (!) 51%  Weight: 169 lb (76.7 kg)  Height: 5\' 3"  (1.6 m)    GENERAL: The patient is a well-nourished female, in no acute distress. The vital signs are documented above. CARDIAC: There is a regular rate and rhythm.  VASCULAR: She has a diminished right radial pulse. I cannot palpate a thrill in her right radiocephalic AV fistula. Her radial and ulnar signals with the Doppler are reasonable. PULMONARY: There is good air exchange bilaterally without  wheezing  or rales. NEUROLOGIC: No focal weakness or paresthesias are detected. SKIN: There are no ulcers or rashes noted. PSYCHIATRIC: The patient has a normal affect.  DATA:    DUPLEX OF RIGHT RADIOCEPHALIC AV FISTULA:  I have independently interpreted the duplex of the right radiocephalic AV fistula. There is minimal flow noted in the fistula.  MEDICAL ISSUES:   END-STAGE RENAL DISEASE:  Her right radiocephalic fistula is occluded. She might potentially be a candidate for a right upper arm fistula. Her upper arm cephalic vein had diameters ranging from 0.30-0.36 cm thus it was somewhat marginal in size. This was not adequate look like the basilic vein was not ideal and she would likely require a right upper arm graft. However currently she's having problems with her blood pressure being low and I would be reluctant to proceed with access until this issue is resolved. Her blood pressures been a bit of a challenge given her history of congestive heart failure. Her son and the patient are in agreement with this and do not want to proceed with surgery until her blood pressure is under better control and she's feeling a little stronger. They will call us when they're ready to schedule placement of a new right upper arm fistula or AV graft.  Deitra Mayo [facility] [phone]

## 2016-10-19 DIAGNOSIS — D649 Anemia, unspecified: Secondary | ICD-10-CM | POA: Diagnosis not present

## 2016-10-19 DIAGNOSIS — D631 Anemia in chronic kidney disease: Secondary | ICD-10-CM | POA: Diagnosis not present

## 2016-10-19 DIAGNOSIS — D472 Monoclonal gammopathy: Secondary | ICD-10-CM | POA: Diagnosis not present

## 2016-10-19 DIAGNOSIS — N185 Chronic kidney disease, stage 5: Secondary | ICD-10-CM | POA: Diagnosis not present

## 2016-10-19 DIAGNOSIS — N2581 Secondary hyperparathyroidism of renal origin: Secondary | ICD-10-CM | POA: Diagnosis not present

## 2016-10-19 DIAGNOSIS — Z23 Encounter for immunization: Secondary | ICD-10-CM | POA: Diagnosis not present

## 2016-10-19 DIAGNOSIS — N186 End stage renal disease: Secondary | ICD-10-CM | POA: Diagnosis not present

## 2016-10-20 DIAGNOSIS — D631 Anemia in chronic kidney disease: Secondary | ICD-10-CM | POA: Diagnosis not present

## 2016-10-20 DIAGNOSIS — D472 Monoclonal gammopathy: Secondary | ICD-10-CM | POA: Diagnosis not present

## 2016-10-20 DIAGNOSIS — N2581 Secondary hyperparathyroidism of renal origin: Secondary | ICD-10-CM | POA: Diagnosis not present

## 2016-10-20 DIAGNOSIS — N185 Chronic kidney disease, stage 5: Secondary | ICD-10-CM | POA: Diagnosis not present

## 2016-10-20 DIAGNOSIS — D649 Anemia, unspecified: Secondary | ICD-10-CM | POA: Diagnosis not present

## 2016-10-20 DIAGNOSIS — N186 End stage renal disease: Secondary | ICD-10-CM | POA: Diagnosis not present

## 2016-10-20 DIAGNOSIS — Z23 Encounter for immunization: Secondary | ICD-10-CM | POA: Diagnosis not present

## 2016-10-27 DIAGNOSIS — 419620001 Death: Secondary | SNOMED CT | POA: Diagnosis not present

## 2016-10-27 DEATH — deceased

## 2017-01-30 NOTE — Telephone Encounter (Signed)
error 

## 2017-10-15 IMAGING — DX DG CHEST 2V
2 series · 2 of 2 positions shown · non-contrast
Comparison: Chest x-rays dated 06/21/2014 in [REDACTED].

CLINICAL DATA: Nonproductive cough for 3 weeks. History of
hypertension and CHF.

EXAM:
CHEST  2 VIEW

[chest pa]
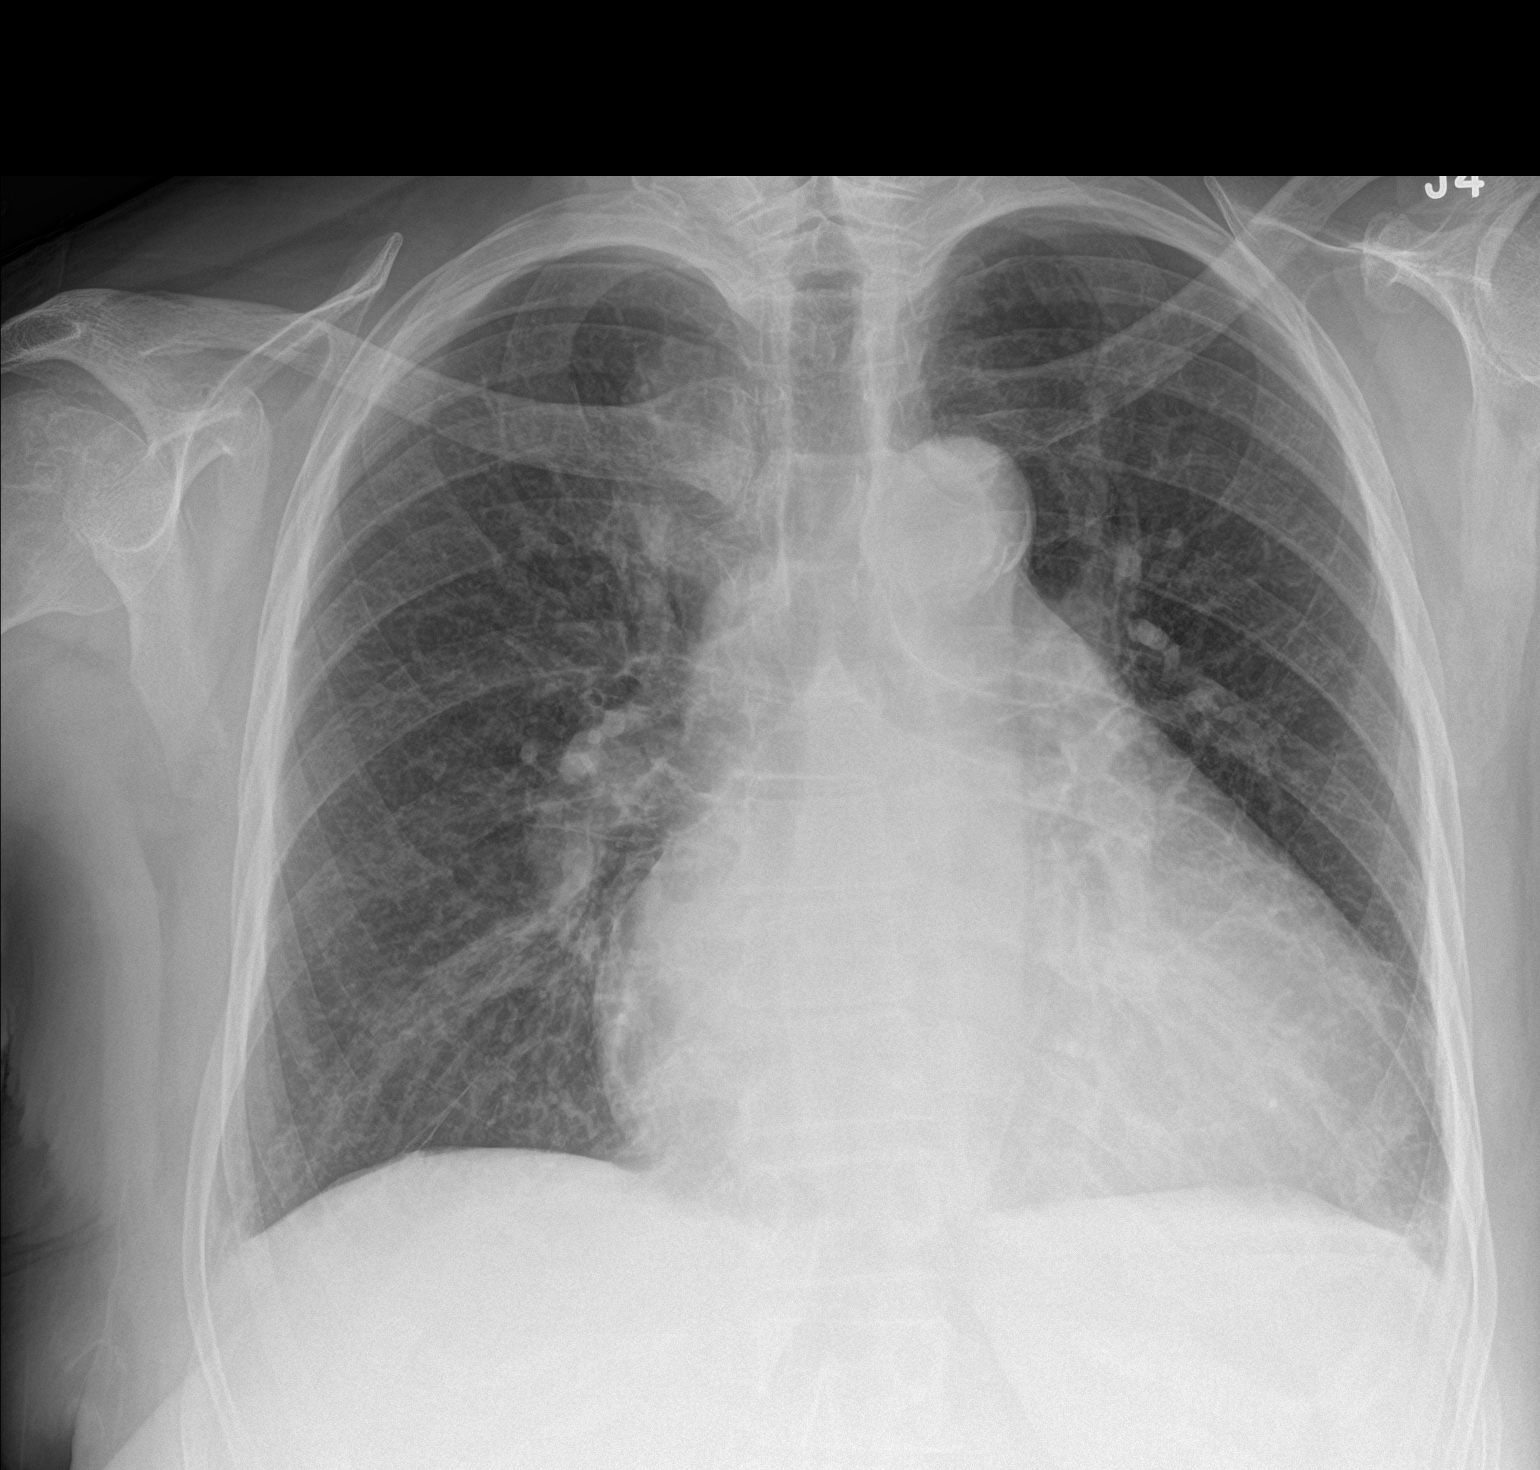

[chest lat]
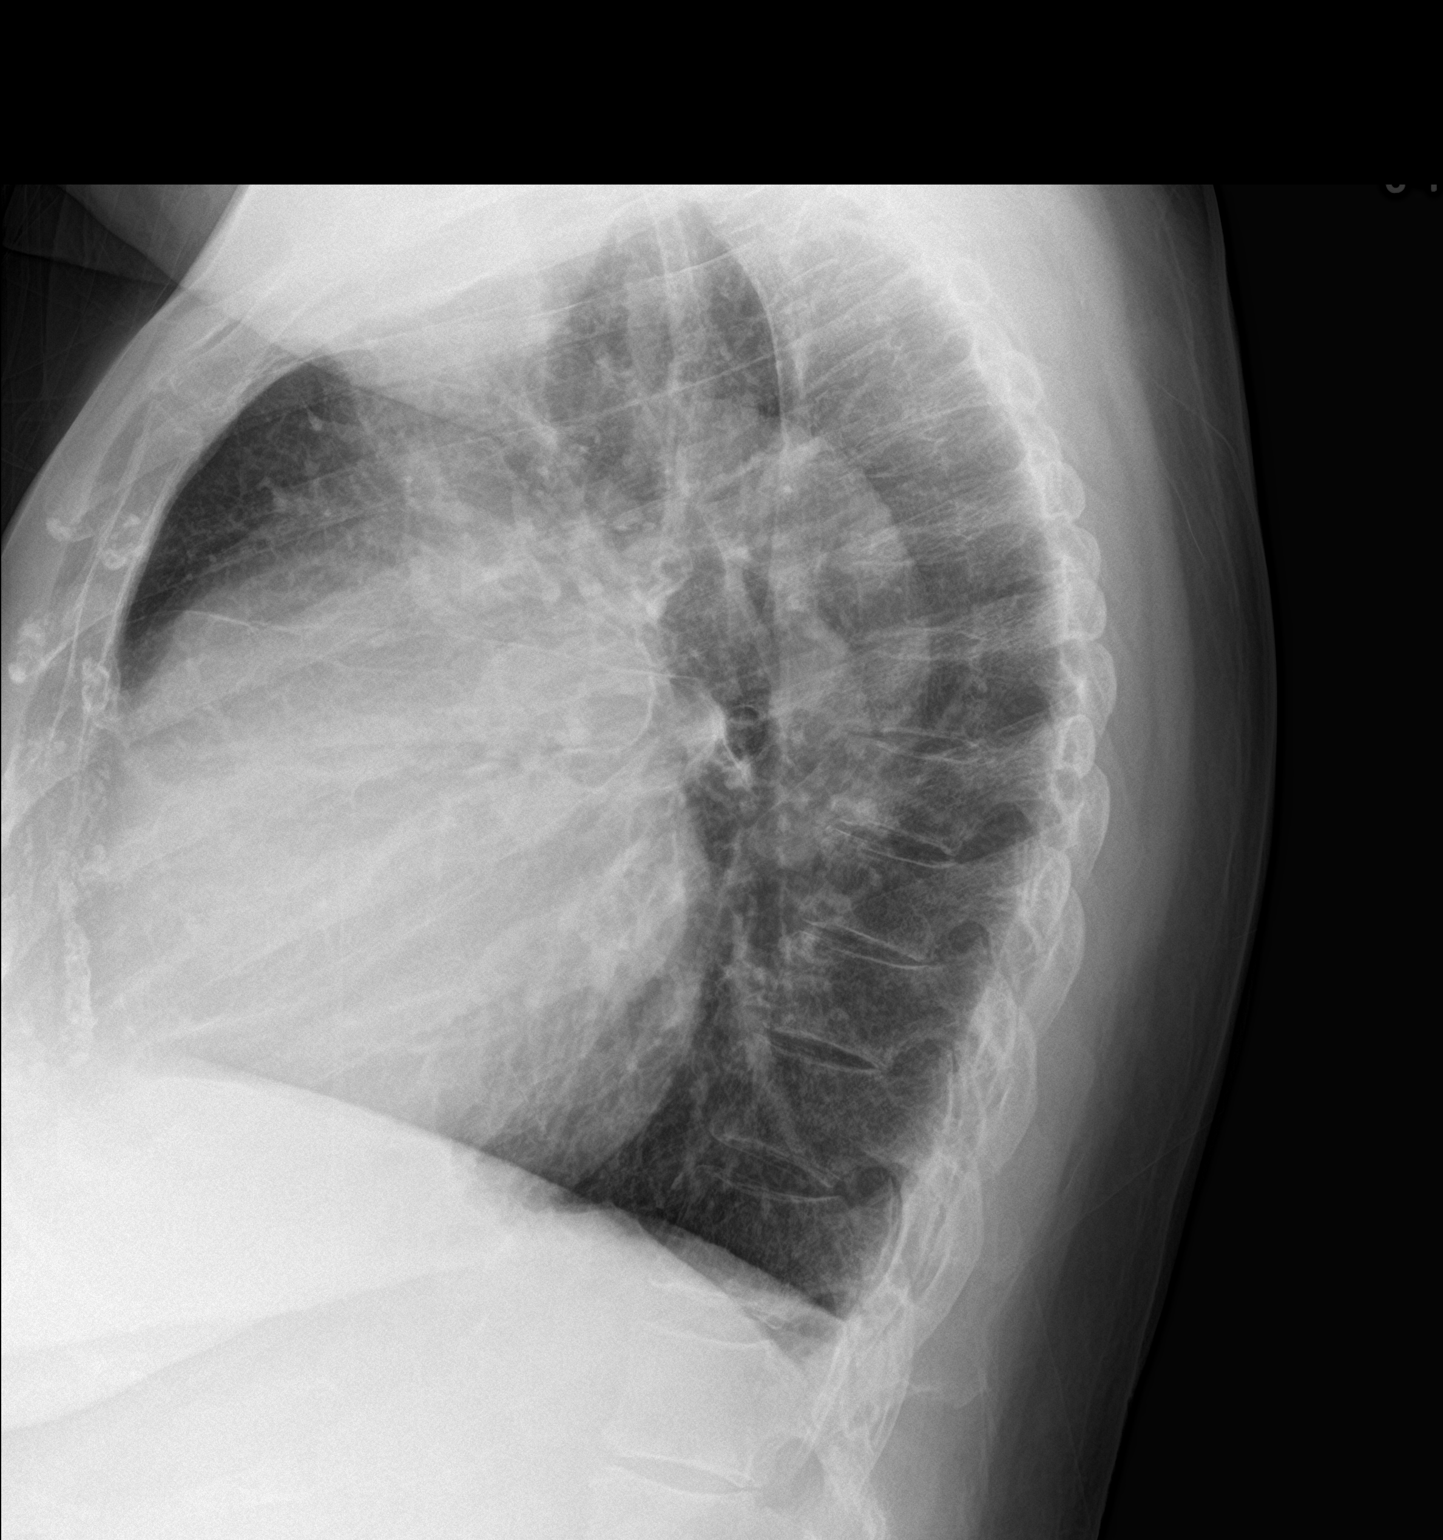

[2 of 2 positions shown; findings below may reference images not displayed]

FINDINGS: Marked cardiomegaly is stable. There is mild central pulmonary
vascular congestion that is likely chronic. No overt alveolar
pulmonary edema. No confluent airspace opacity to suggest a
developing pneumonia. No pleural effusion or pneumothorax seen.
Osseous structures about the chest are unremarkable.
IMPRESSION: Cardiomegaly.

Mild central pulmonary vascular congestion, likely chronic, without
overt pulmonary edema.

No evidence of pneumonia.

## 2017-12-07 IMAGING — US US RENAL
1 series · 14 of 25 positions shown · non-contrast
Comparison: CT 08/12/2015.

CLINICAL DATA: Chronic renal disease.

EXAM:
RENAL / URINARY TRACT ULTRASOUND COMPLETE

[Series 1: us renal · 0.19mm/px · 14 of 37 slices shown]
[im 1/37]
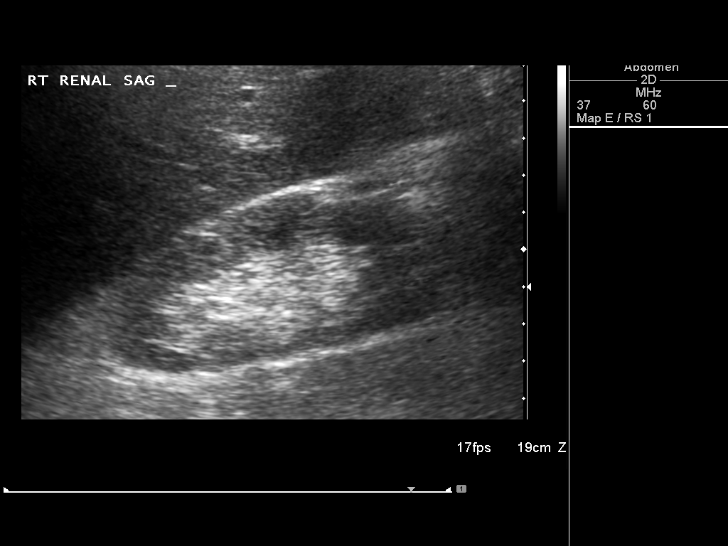
[im 4/37]
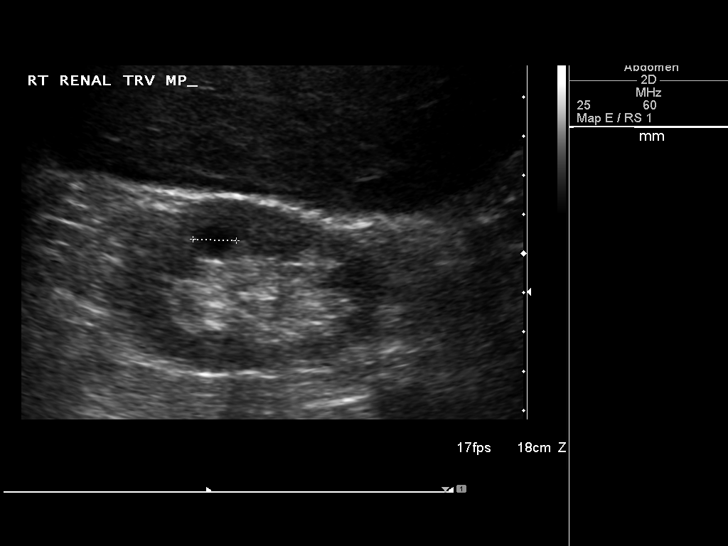
[im 7/37]
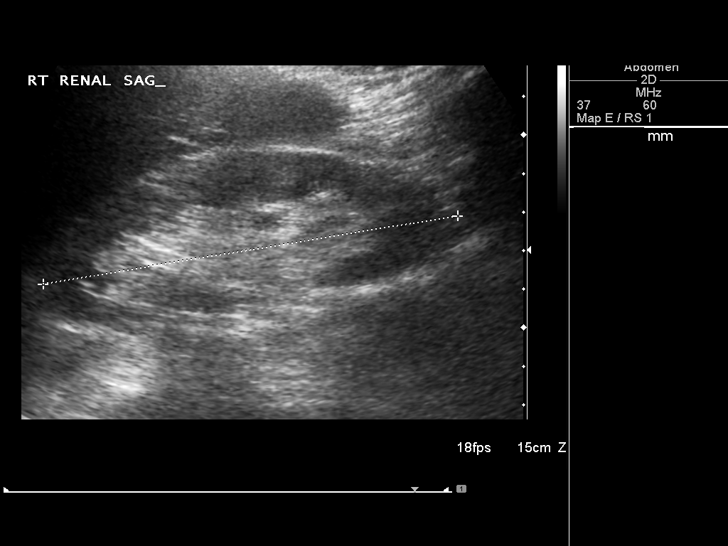
[im 10/37]
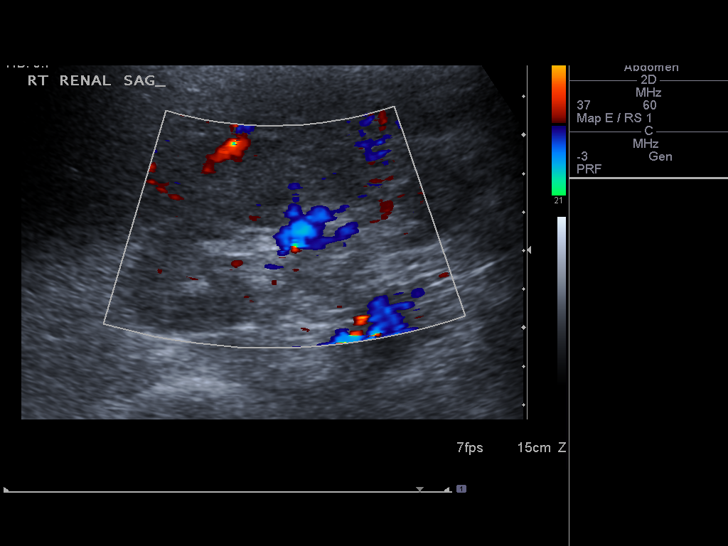
[im 13/37]
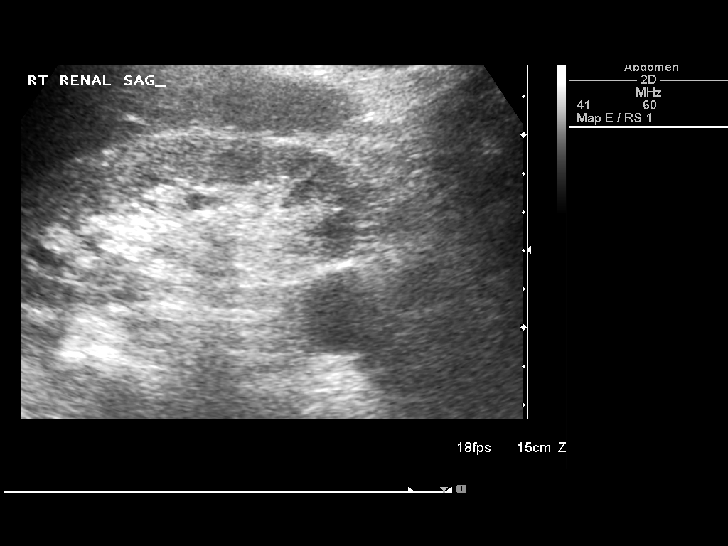
[im 14/37]
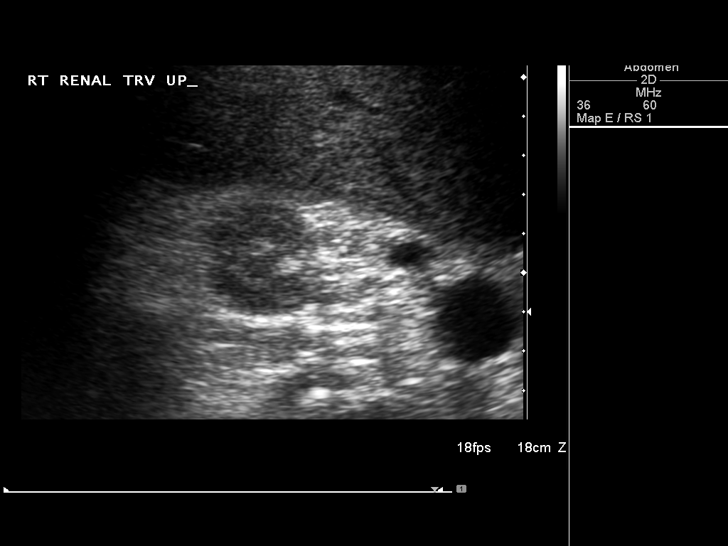
[im 17/37]
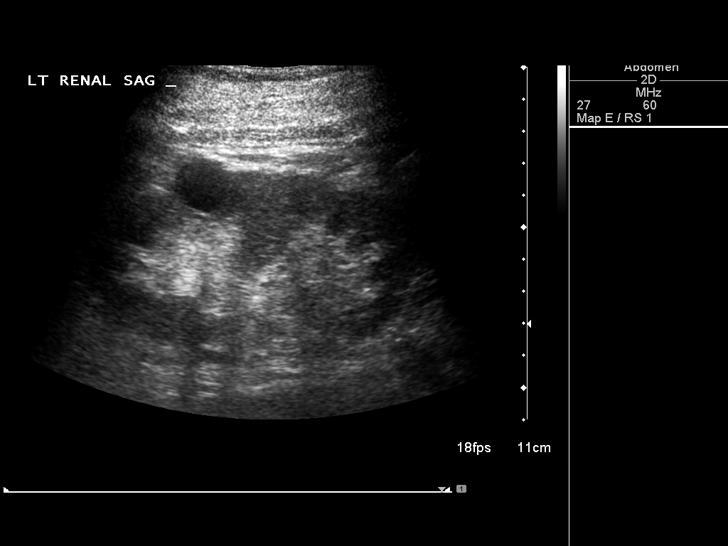
[im 20/37]
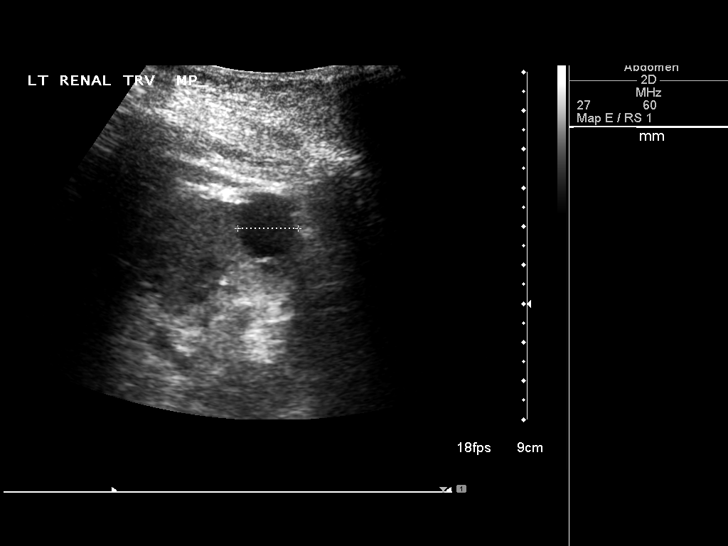
[im 23/37]
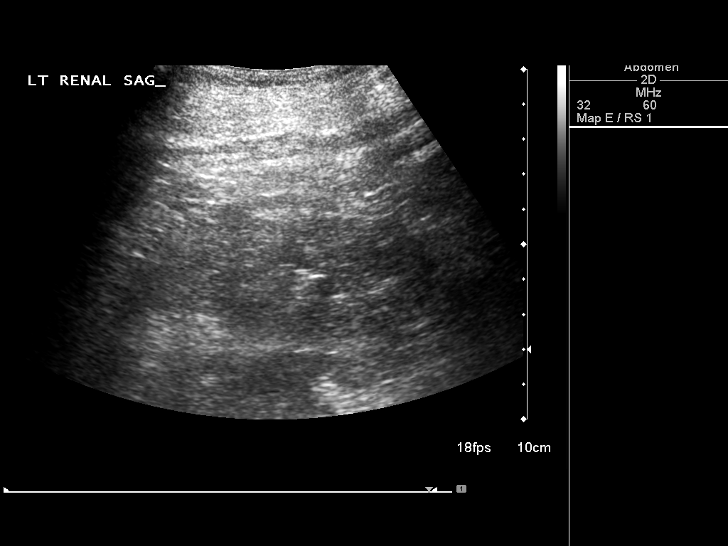
[im 25/37]
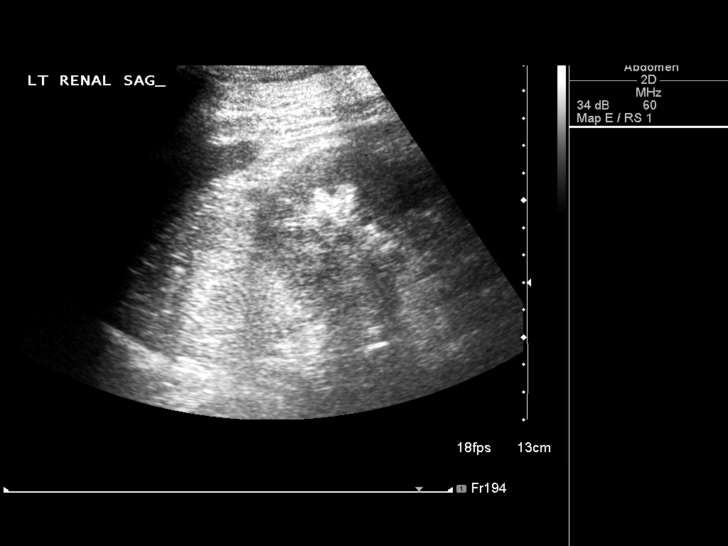
[im 28/37]
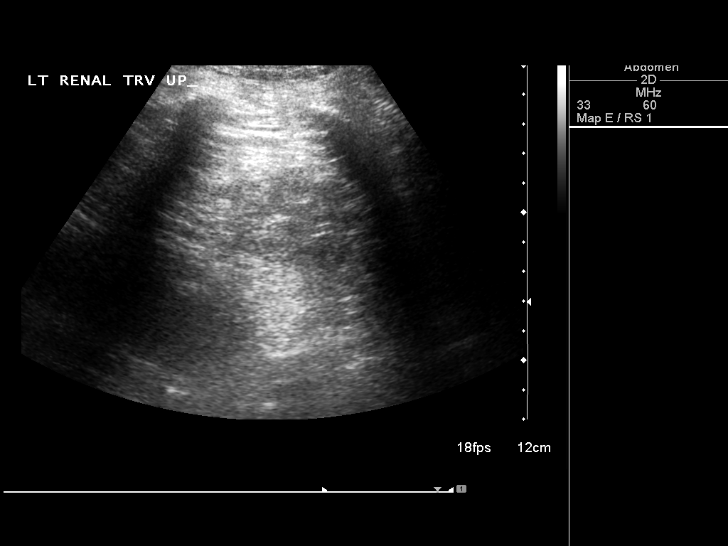
[im 31/37]
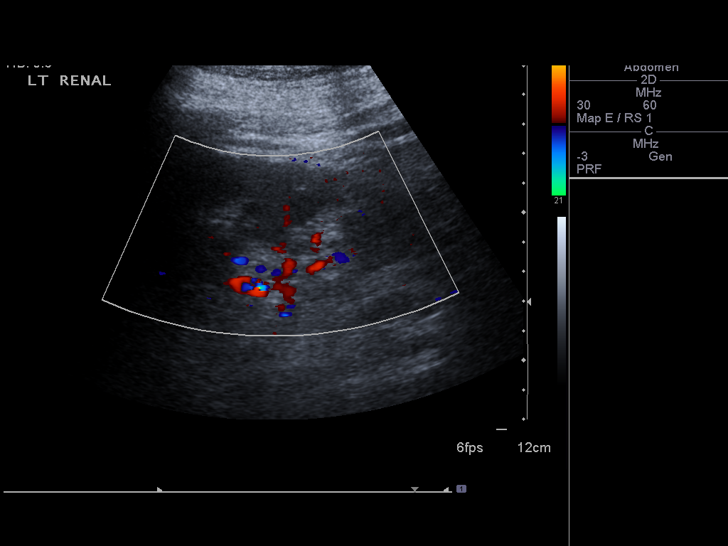
[im 34/37]
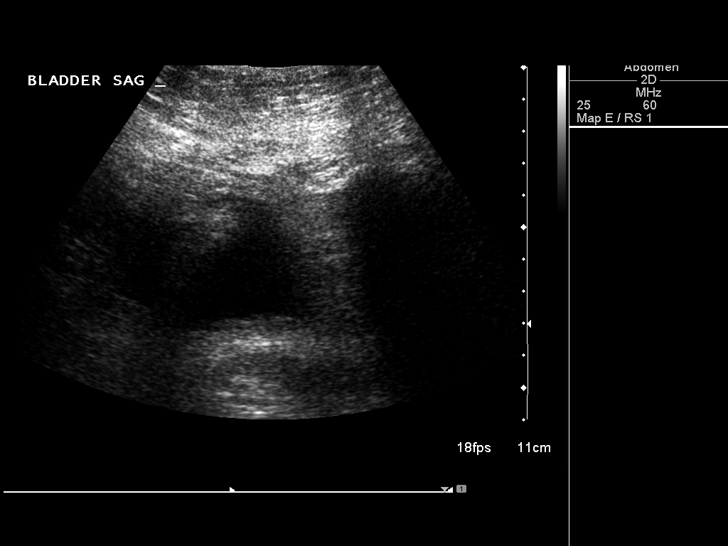
[im 37/37]
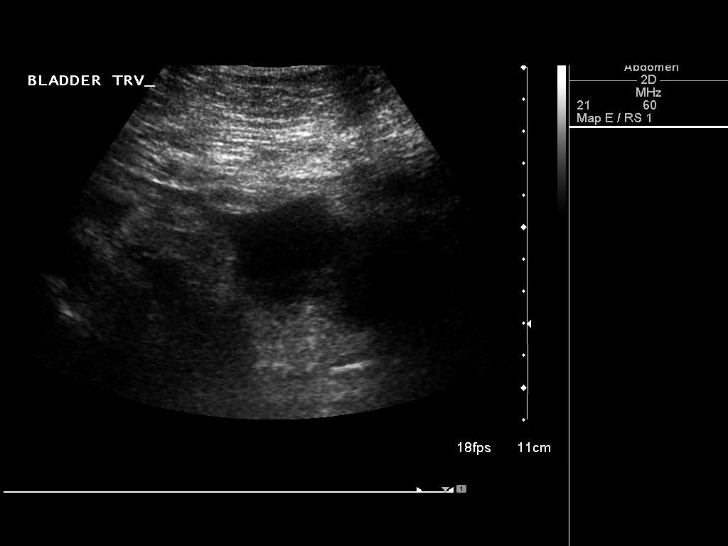

[14 of 25 positions shown; findings below may reference images not displayed]

FINDINGS: Right Kidney:

Length: 10.9 cm. Echogenicity within normal limits. No
hydronephrosis visualized. 1.3 cm simple cyst.

Left Kidney:

Length: 11.7 cm. Echogenicity within normal limits. No
hydronephrosis visualized. 1.8 cm simple cyst.

Bladder:

Appears normal for degree of bladder distention.
IMPRESSION: Bilateral simple renal cysts.  Exam otherwise unremarkable.

## 2017-12-25 IMAGING — US US ABDOMEN COMPLETE
1 series · 13 of 25 positions shown · non-contrast
Comparison: CT abdomen and pelvis 12/18/2015

CLINICAL DATA: Swelling in abdomen for 1 week, diabetes mellitus,
hypertension

EXAM:
ABDOMEN ULTRASOUND COMPLETE

[Series 1: us abdomen complete · 0.22mm/px · 13 of 102 slices shown]
[im 1/102]
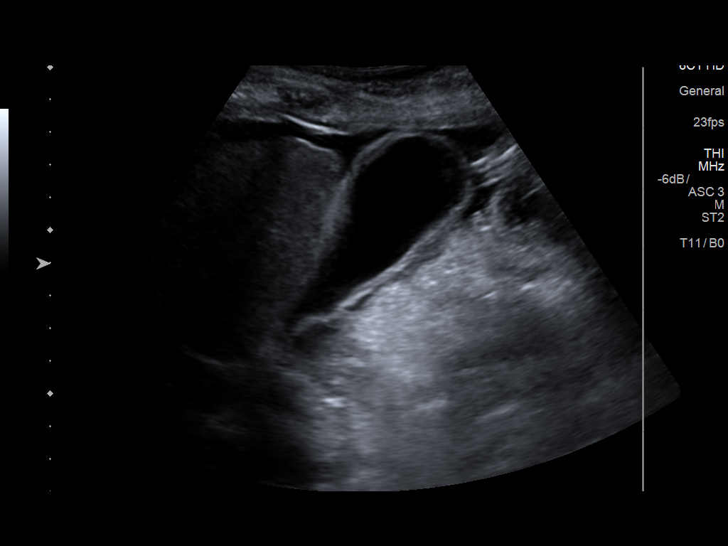
[im 9/102]
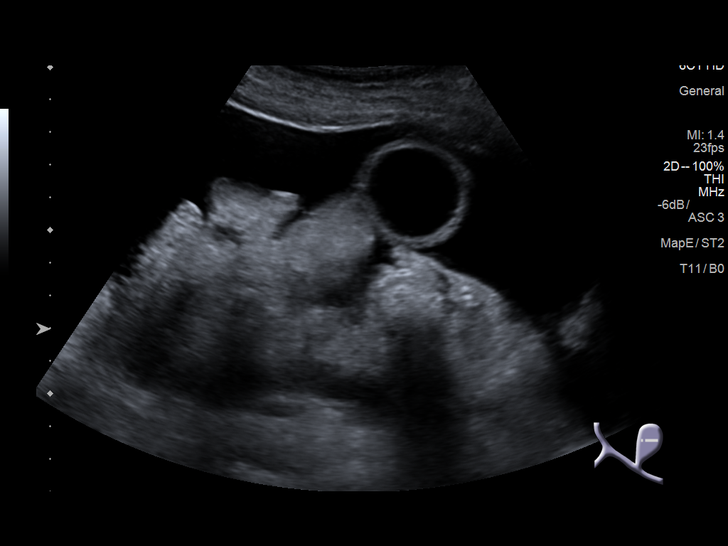
[im 17/102]
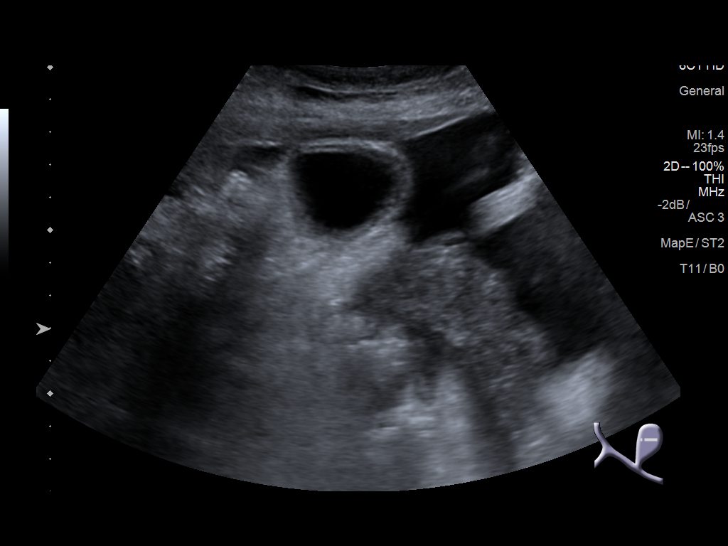
[im 26/102]
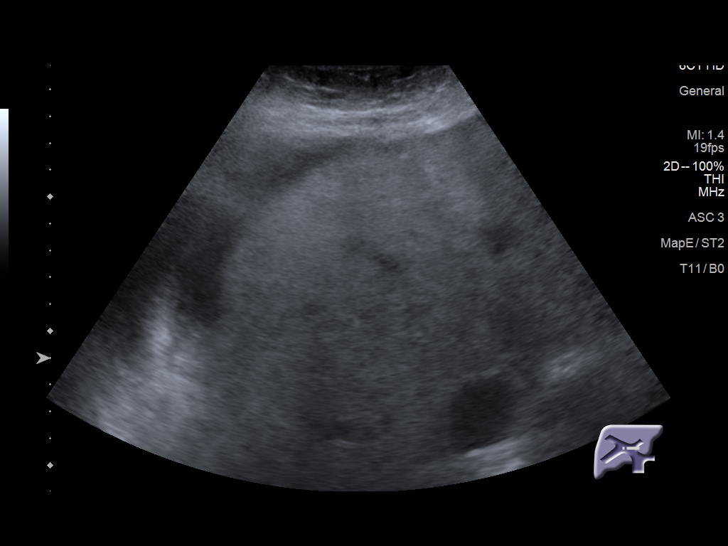
[im 34/102]
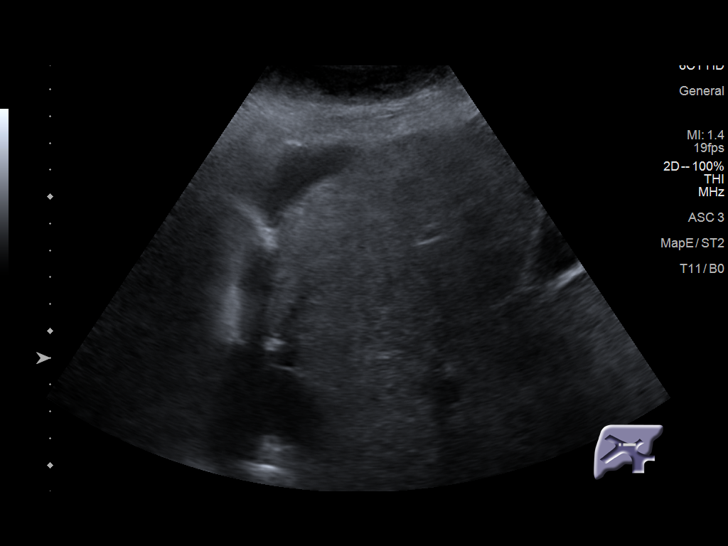
[im 43/102]
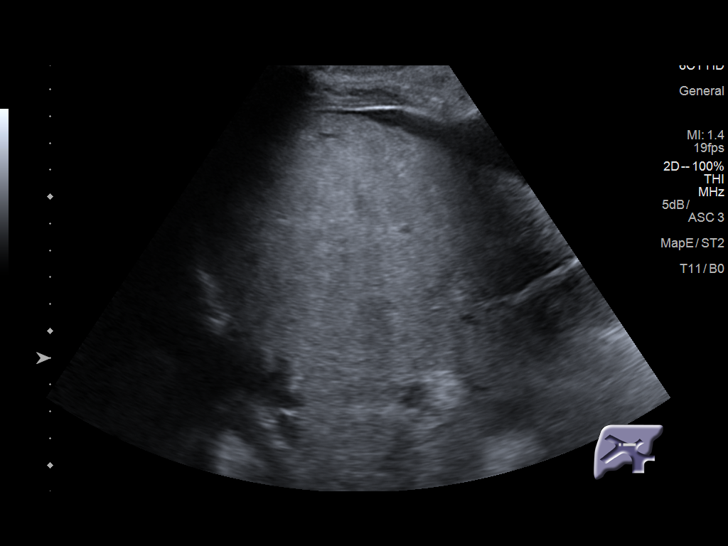
[im 51/102]
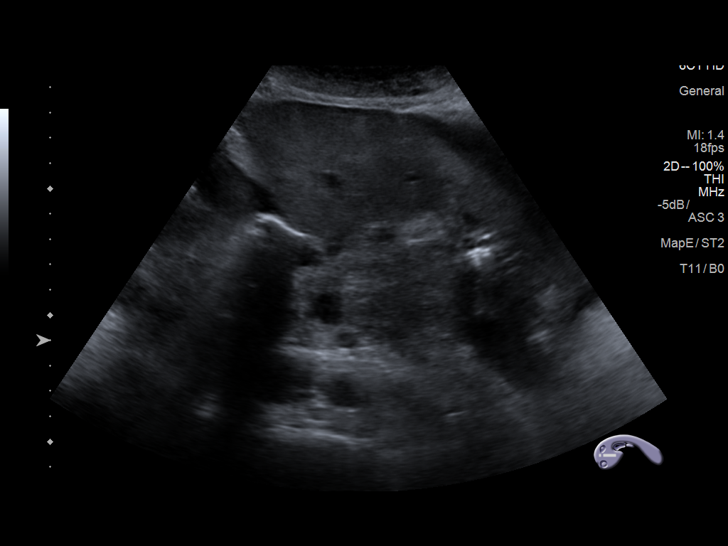
[im 59/102]
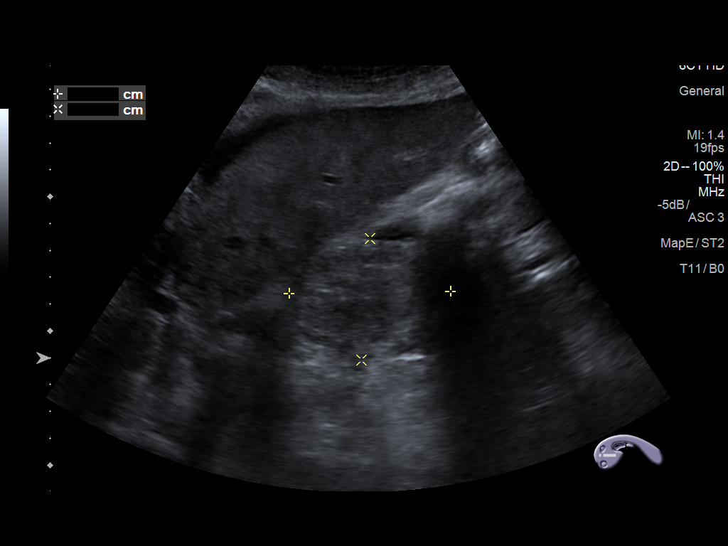
[im 68/102]
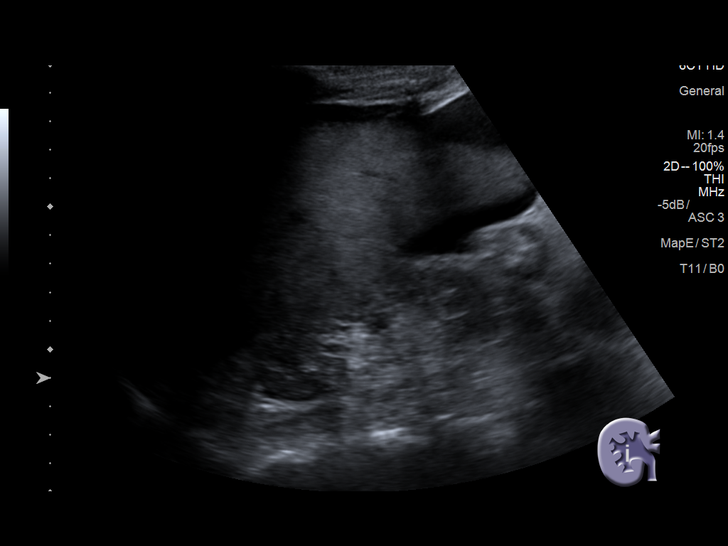
[im 76/102]
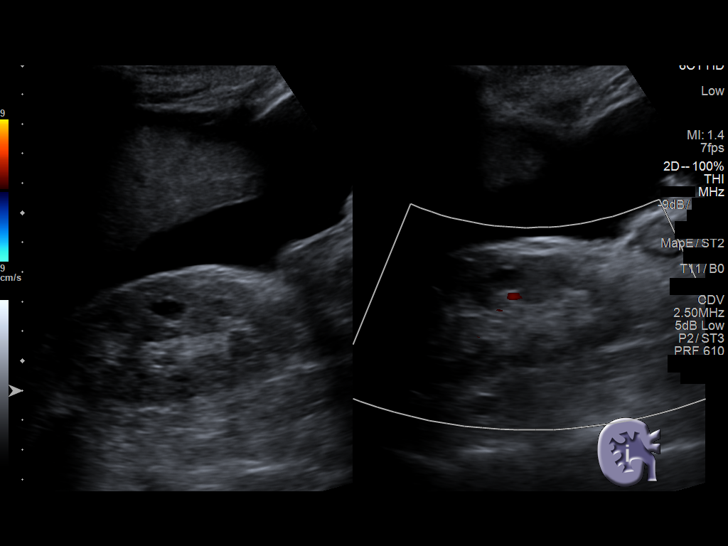
[im 85/102]
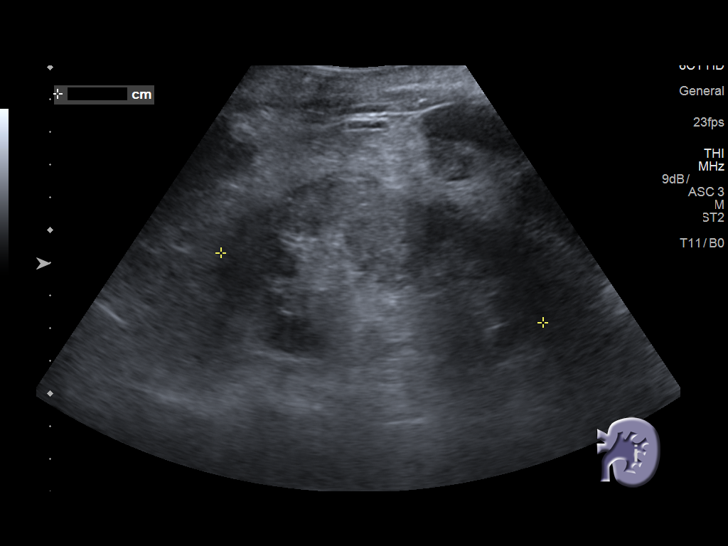
[im 93/102]
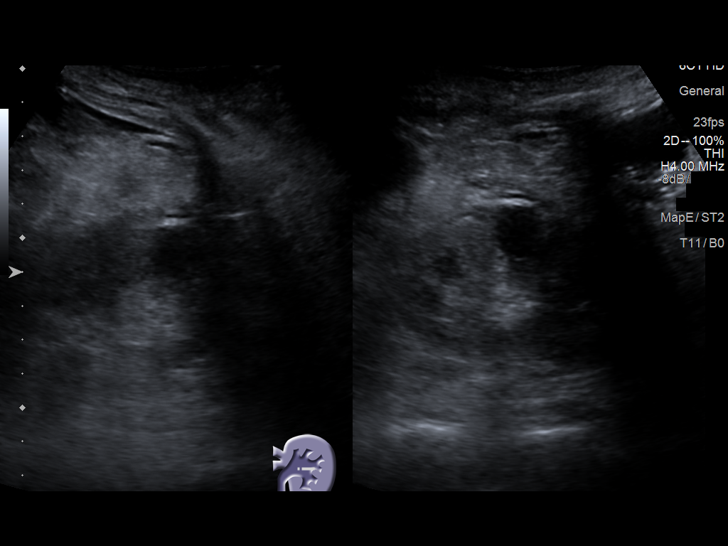
[im 102/102]
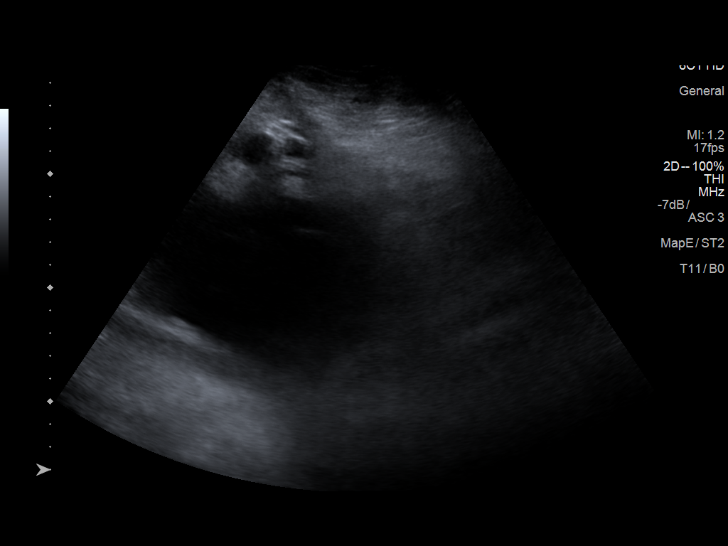

[13 of 25 positions shown; findings below may reference images not displayed]

FINDINGS: Gallbladder: Normally distended. Mildly thickened gallbladder wall.
No gallstones or sonographic Murphy sign.

Common bile duct: Diameter: 4 mm diameter, normal

Liver: Heterogeneous hepatic echogenicity with slightly nodular
hepatic contours likely representing cirrhosis. No discrete hepatic
mass identified. No intrahepatic biliary dilatation. Hepatopetal
portal venous flow.

IVC: Normal appearance

Pancreas: Heterogeneous hypoechoic enlargement of the proximal
pancreatic tail suspicious for mass 6.0 x 4.5 x 4.5 cm ; patient had
a similar sized area of focal enlargement on the prior CT.

Spleen: Normal appearance, 6.2 cm length

Right Kidney: Length: 10.0 cm. Cortical thinning with slightly
increased cortical echogenicity. Small cyst at mid RIGHT kidney 15 x
9 x 10 mm. No hydronephrosis or additional mass.

Left Kidney: Length: 10.1 cm. Cortical thinning. Increased cortical
echogenicity. Small cyst at mid inferior pole 17 x 16 x 17 mm. No
additional mass or hydronephrosis.

Abdominal aorta: Visualized proximal aorta normal caliber with mid
to distal aorta obscured by bowel gas

Other findings: Scattered ascites
IMPRESSION: Cirrhotic appearing liver with ascites.

Pancreatic mass 6.0 cm in greatest size at proximal tail, also noted
on prior CT from 2761; dedicated assessment by MR imaging with and
without contrast recommended.

BILATERAL renal cortical thinning median question medical renal
disease changes with small BILATERAL renal cysts.

## 2018-01-28 IMAGING — CT CT ABD-PELV W/O CM
2 of 4 series · 15 of 46 positions shown, 17 images · non-contrast
Comparison: None.

CLINICAL DATA: History of hypodense hypo enhancing mass at the
junction of the head of pancreas. Outside report documents the
lesion at 4.8 x 4.5 cm. Outside CT report from [REDACTED] also documents a 3.9 cm left adrenal mass.

EXAM:
CT ABDOMEN AND PELVIS WITHOUT CONTRAST
TECHNIQUE: Multidetector CT imaging of the abdomen and pelvis was performed
following the standard protocol without IV contrast.

[Series 2: abd/pelvis w/(date) · axial · 0.72mm/px · z∈[+866,+1226]mm · 12 of 83 slices shown, 14 images]
[im 7/83  soft-tissue]
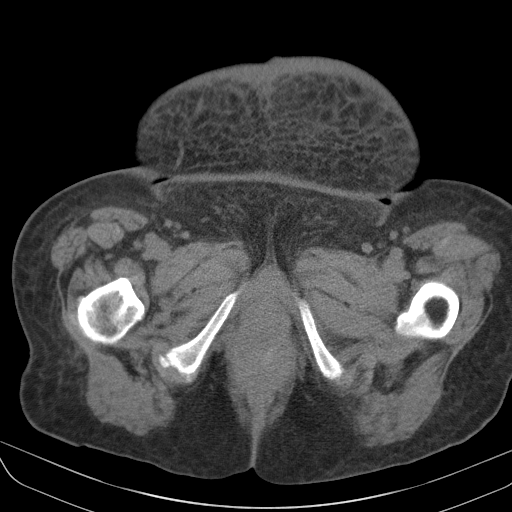
[im 7/83  bone]
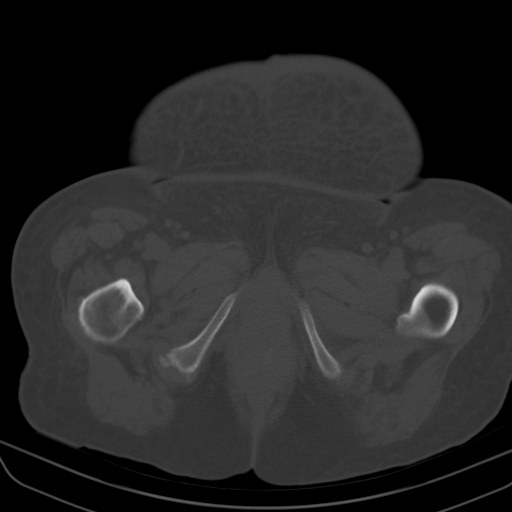
[im 14/83  soft-tissue]
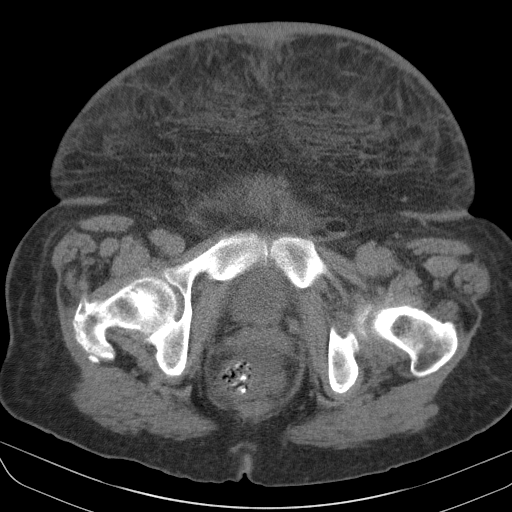
[im 20/83  soft-tissue]
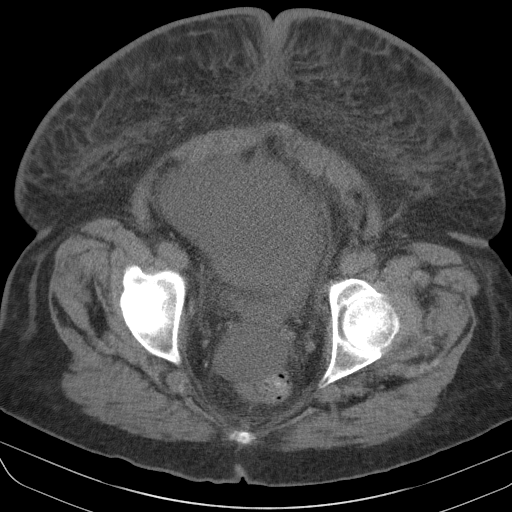
[im 27/83  soft-tissue]
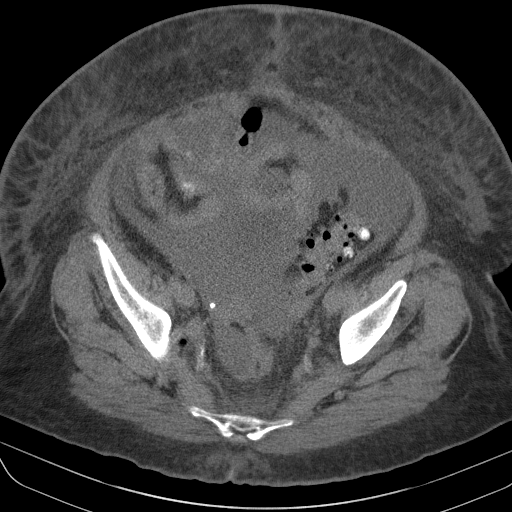
[im 33/83  soft-tissue]
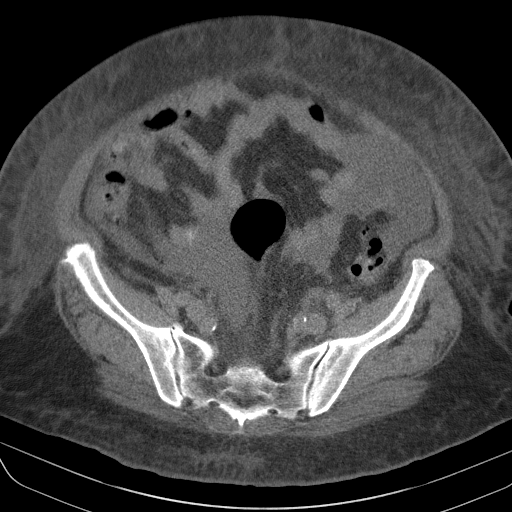
[im 40/83  soft-tissue]
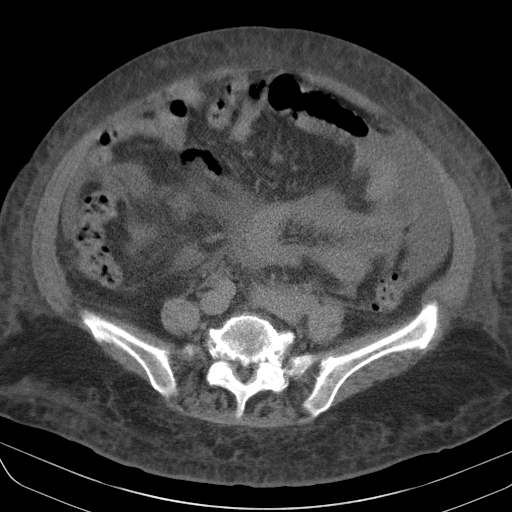
[im 46/83  soft-tissue]
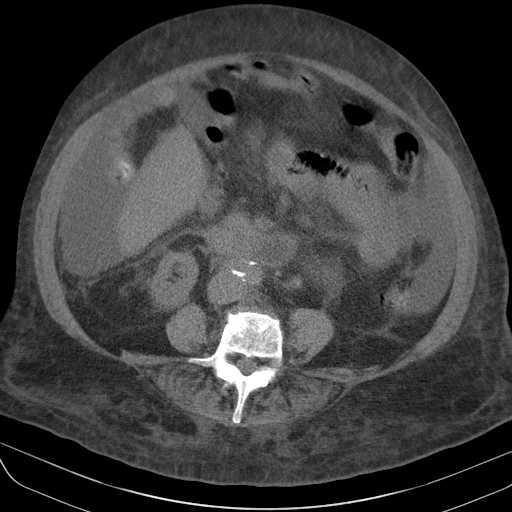
[im 53/83  soft-tissue]
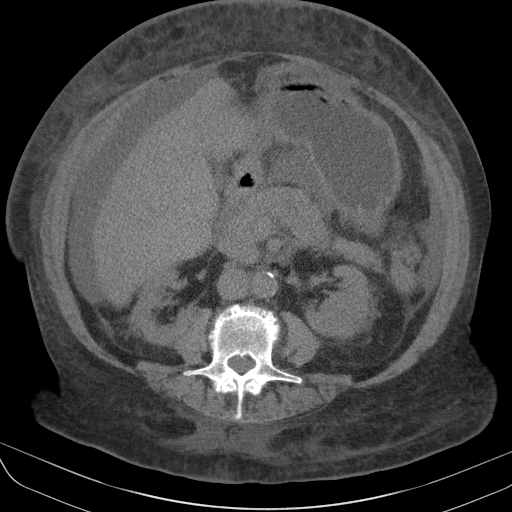
[im 60/83  soft-tissue]
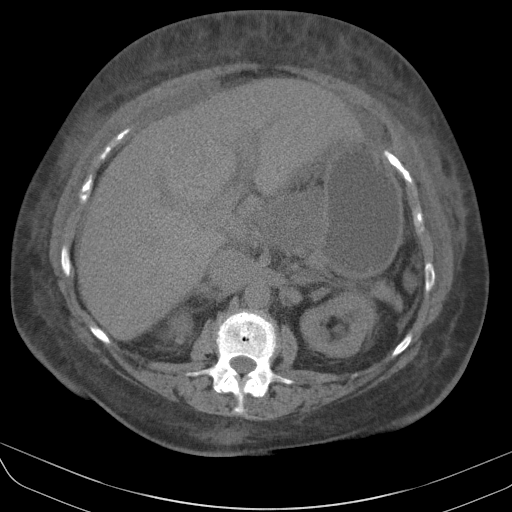
[im 60/83  bone]
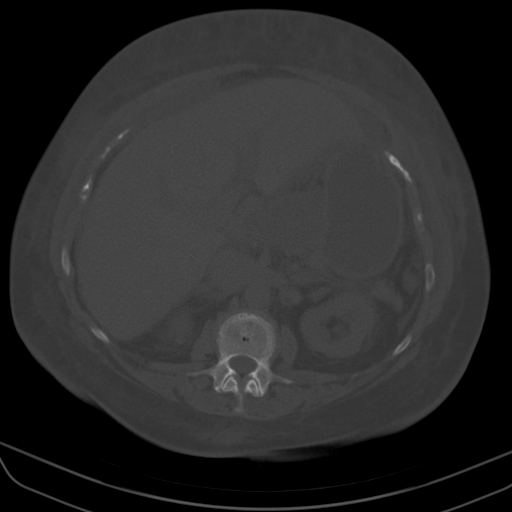
[im 66/83  soft-tissue]
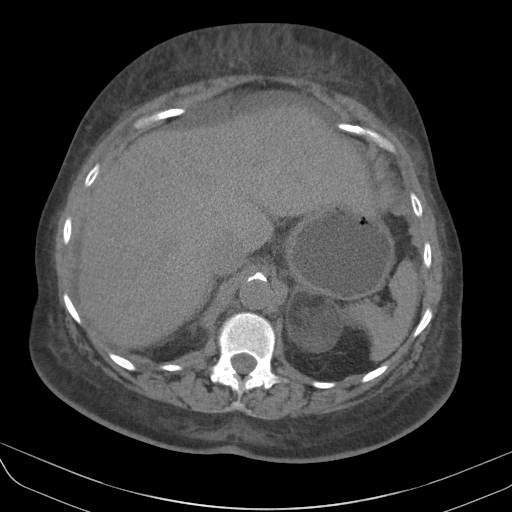
[im 73/83  soft-tissue]
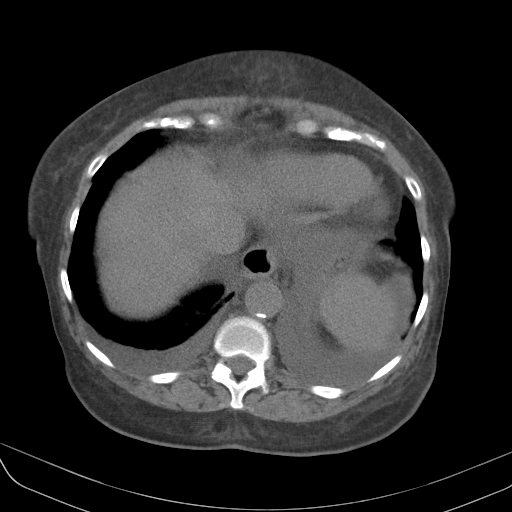
[im 79/83  soft-tissue]
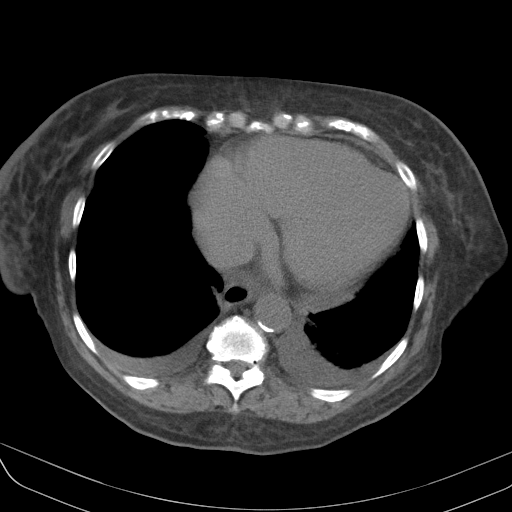

[Series 3: cor · coronal · 0.73mm/px · 3 of 121 slices shown]
[im 41/121  soft-tissue]
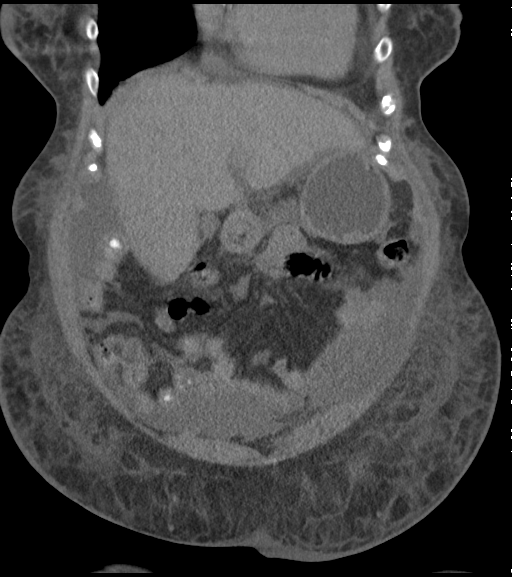
[im 54/121  soft-tissue]
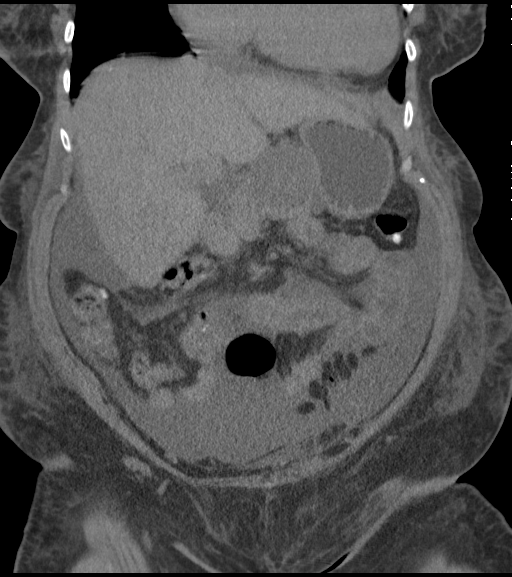
[im 67/121  soft-tissue]
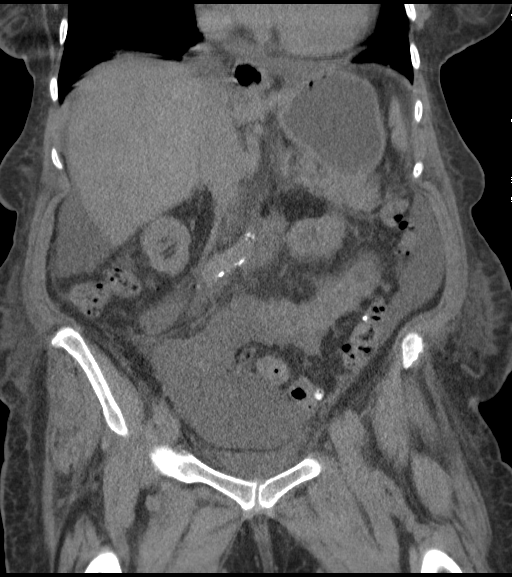

[15 of 46 positions shown; findings below may reference images not displayed]

FINDINGS: Lower chest: Small bilateral pleural effusions.

Hepatobiliary: No focal abnormality seen in the liver on this
noncontrast exam. Liver contour appears subtly nodular and left
hepatic lobe enlarged. Gallbladder unremarkable. No intrahepatic or
extrahepatic biliary dilation.

Pancreas: 4.8 x 5.0 cm hypo attenuating mass is identified in the
body the pancreas but difficult to measure without intravenous
contrast in apparent fluid between the stomach and the pancreas.

Spleen: No splenomegaly. No focal mass lesion.

Adrenals/Urinary Tract: Right adrenal gland unremarkable. 4.9 x
cm mass in the left adrenal gland measures larger than the
previously reported measurements.

Both kidneys appear atrophic. 18 mm interpolar lesion in the left
kidney approaches water attenuation and is likely a cyst. No
hydronephrosis. No hydroureter. The urinary bladder appears normal
for the degree of distention.

Stomach/Bowel: Small hiatal hernia. Stomach otherwise unremarkable.
Duodenum is normally positioned as is the ligament of Treitz. No
small bowel wall thickening. No small bowel dilatation. Terminal
ileum not well seen. The appendix is not visualized, but there is no
edema or inflammation in the region of the cecum. Diverticuli are
seen scattered along the entire length of the colon without CT
findings of diverticulitis.

Vascular/Lymphatic: There is abdominal aortic atherosclerosis
without aneurysm. No retroperitoneal lymphadenopathy. Assessment of
the mesentery is degraded by motion and edema. No pelvic sidewall
lymphadenopathy.

Reproductive: Uterus surgically absent.  No adnexal mass.

Other: Moderate volume intraperitoneal free fluid noted. Edema is
seen within the mesentery in soft tissues of the pelvic floor.

Musculoskeletal: Bone windows reveal no worrisome lytic or sclerotic
osseous lesions. Diffuse body wall edema is evident.
IMPRESSION: 1. Large hypo attenuating lesion in the body the pancreas measures
similar to dimensions reported lawn outside CT [facility] dated [facility]. This cannot be definitively
characterize today and neoplasm remains a distinct consideration.
Direct comparison to earlier imaging studies would be helpful. If
the patient can receive intravenous contrast material, MRI without
with contrast would be the study of choice to further evaluate.
2. 4.9 cm left adrenal mass measures larger than reported on the
previous study from about 2 years ago. This does appear to have some
macroscopic fat attenuation within the lesion and may represent a
myelo lipoma but metastatic disease or primary adrenal neoplasm
cannot be excluded. Direct comparison earlier imaging studies would
prove helpful to further evaluate.
3. Imaging features suggestive of, but not definite for cirrhosis.
4. Moderate ascites.  Diffuse body wall edema associated.
5. Abdominal aortic atherosclerosis.

## 2018-01-29 IMAGING — US US ABDOMEN LIMITED
1 series · 5 of 5 positions shown · non-contrast
Comparison: Abdominal ultrasound July 05, 2016

CLINICAL DATA: Abdominal distension, evaluate for ascites.

EXAM:
LIMITED ABDOMEN ULTRASOUND FOR ASCITES
TECHNIQUE: Limited ultrasound survey for ascites was performed in all four
abdominal quadrants.

[Series 1: us abdomen limited · 0.26mm/px · 5 of 5 slices shown]
[im 1/5]
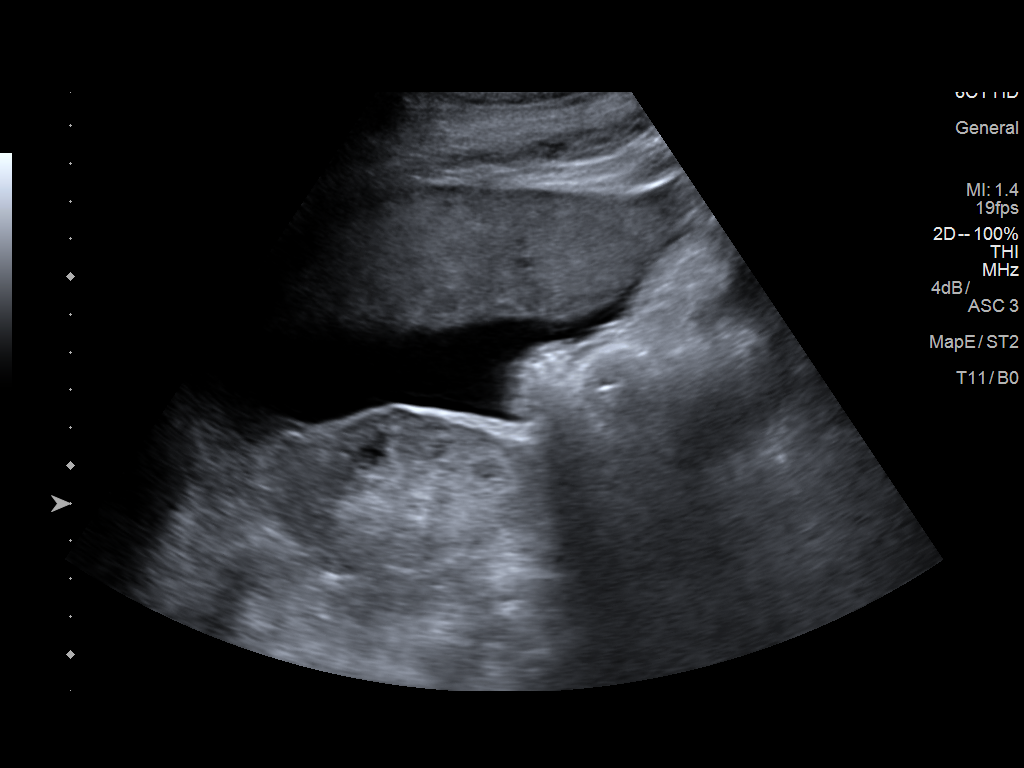
[im 2/5]
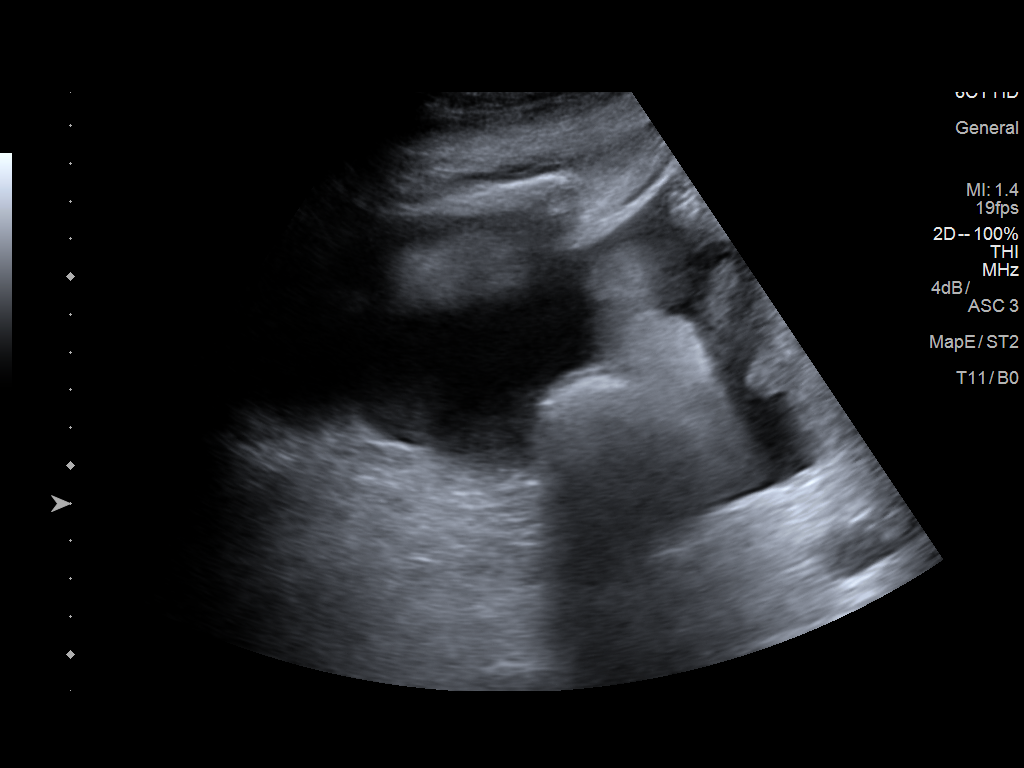
[im 3/5]
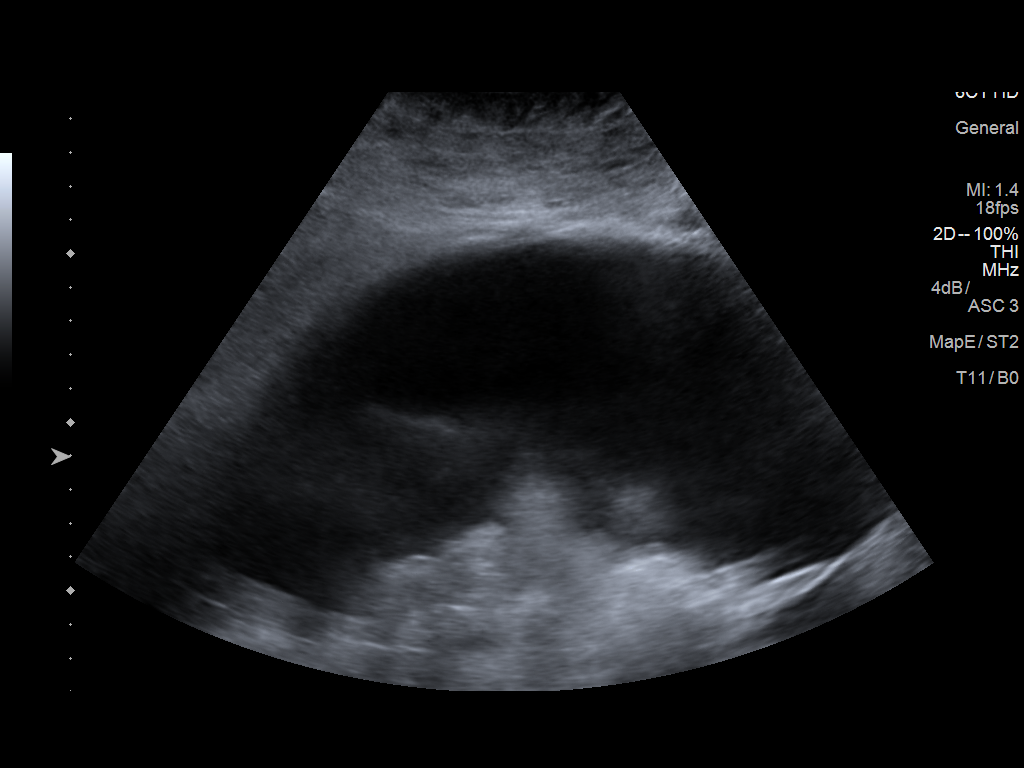
[im 4/5]
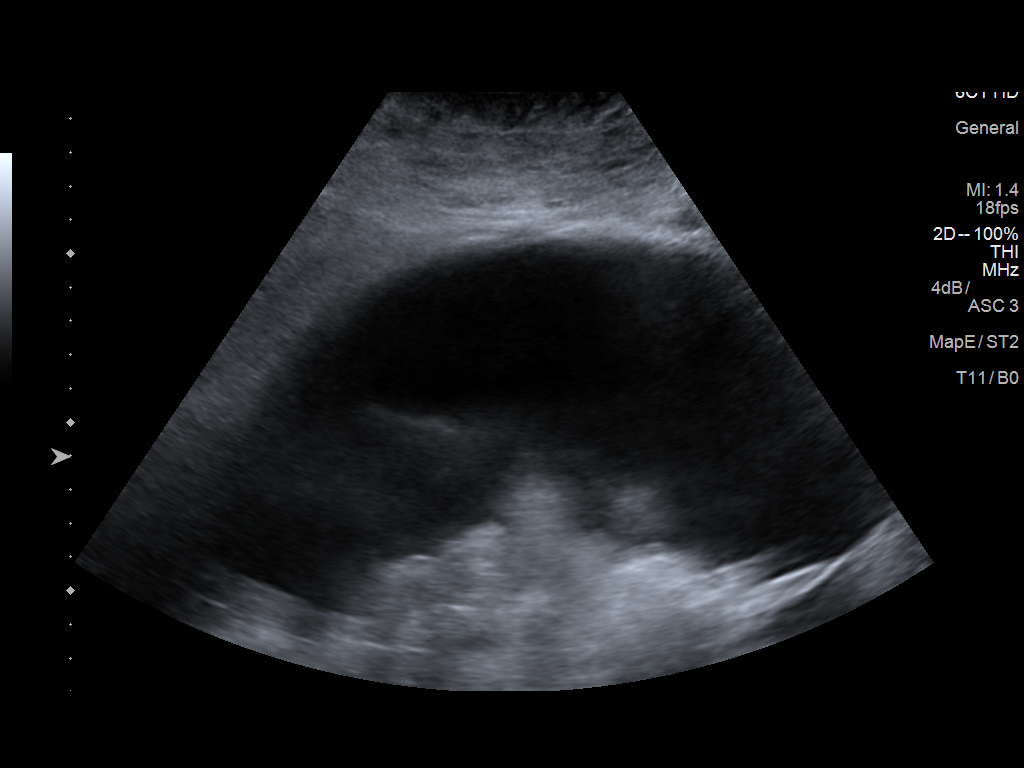
[im 5/5]
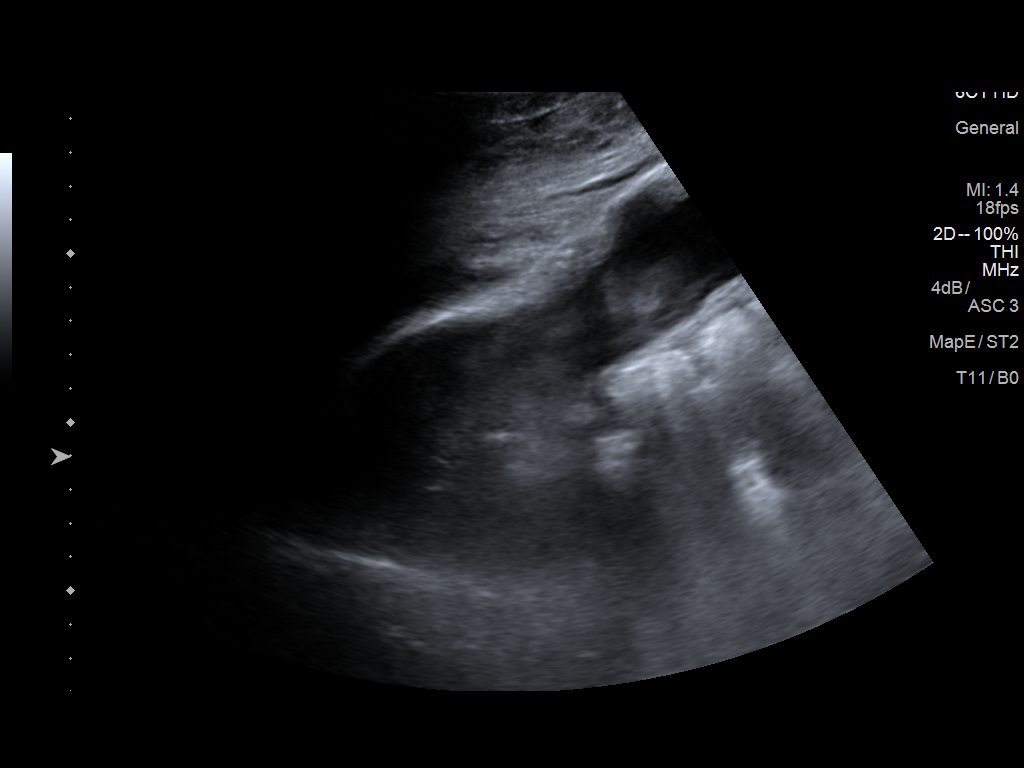

[5 of 5 positions shown; findings below may reference images not displayed]

FINDINGS: There is a moderate amount of ascites observed in all 4 quadrants.
The greatest volume appears to lie in the left lower quadrant.
IMPRESSION: There is a moderate amount of ascites noted throughout the abdomen.

## 2018-02-08 IMAGING — US US PARACENTESIS
1 series · 9 of 9 positions shown · non-contrast
Comparison: none

INDICATION: Pancreatic mass, left adrenal mass, imaging concerning for
cirrhosis, ascites. Request made for diagnostic and therapeutic
paracentesis up to 1 liter.

[Series 1: us paracentesis · 0.30mm/px · 9 of 9 slices shown]
[im 1/9]
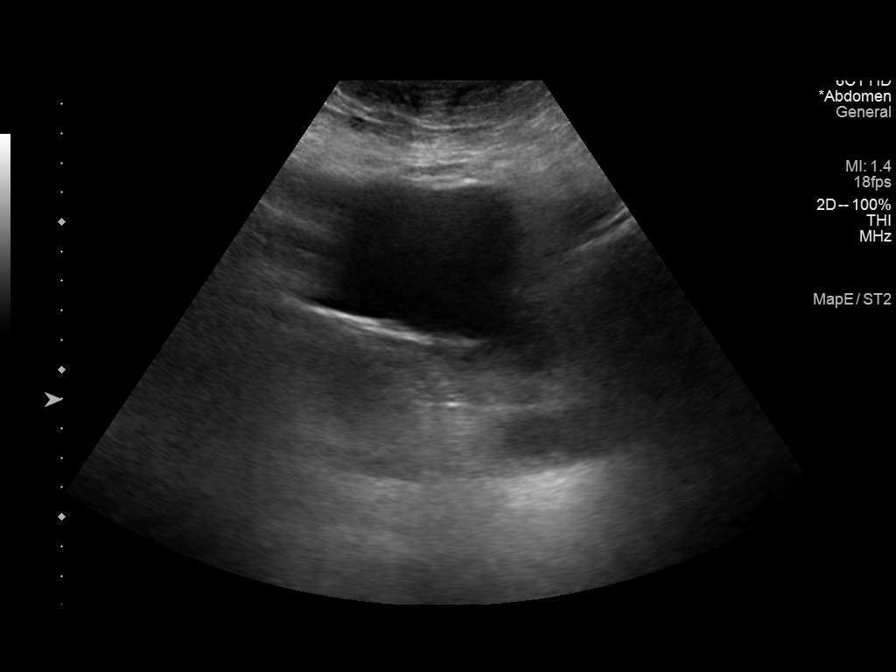
[im 2/9]
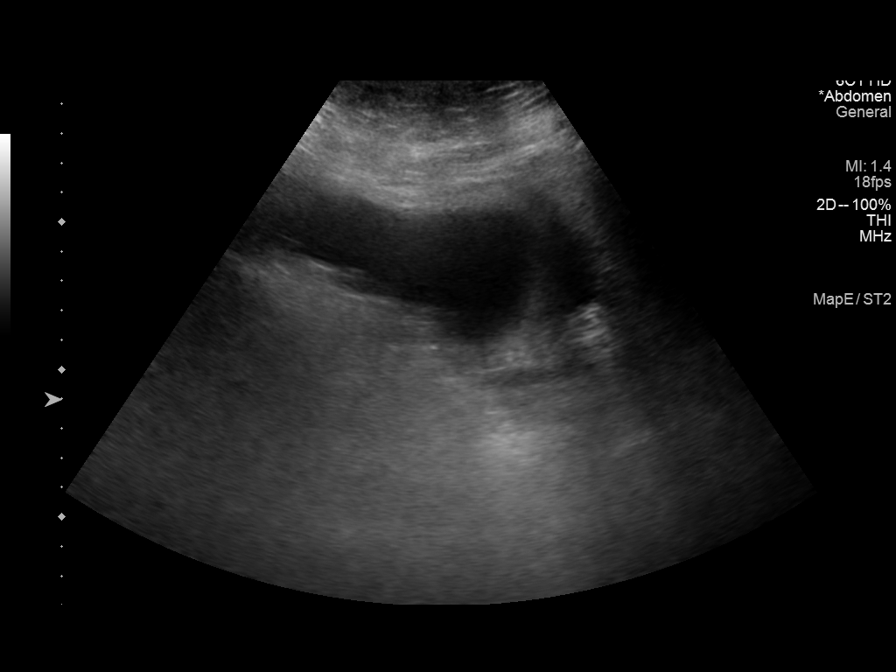
[im 3/9]
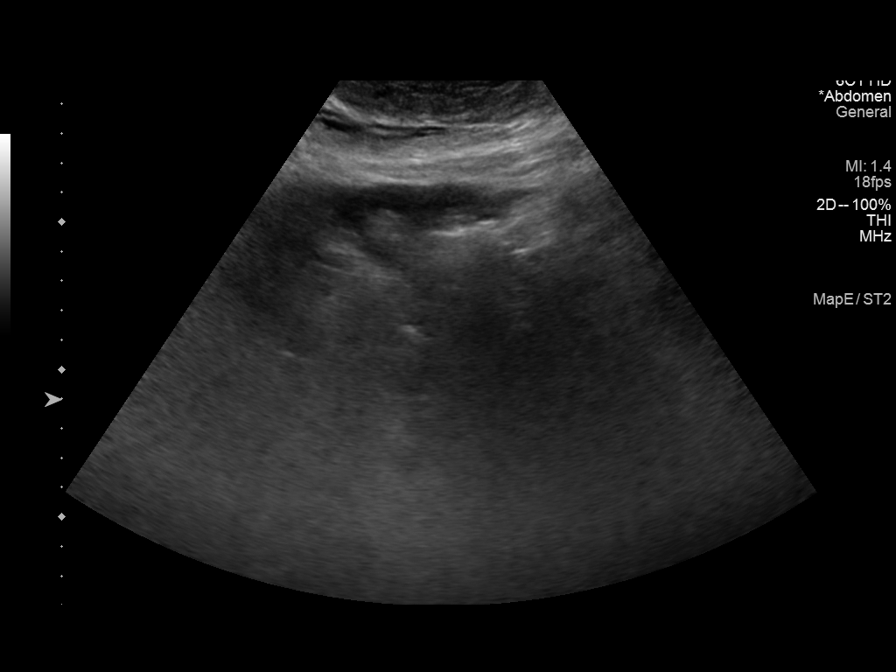
[im 4/9]
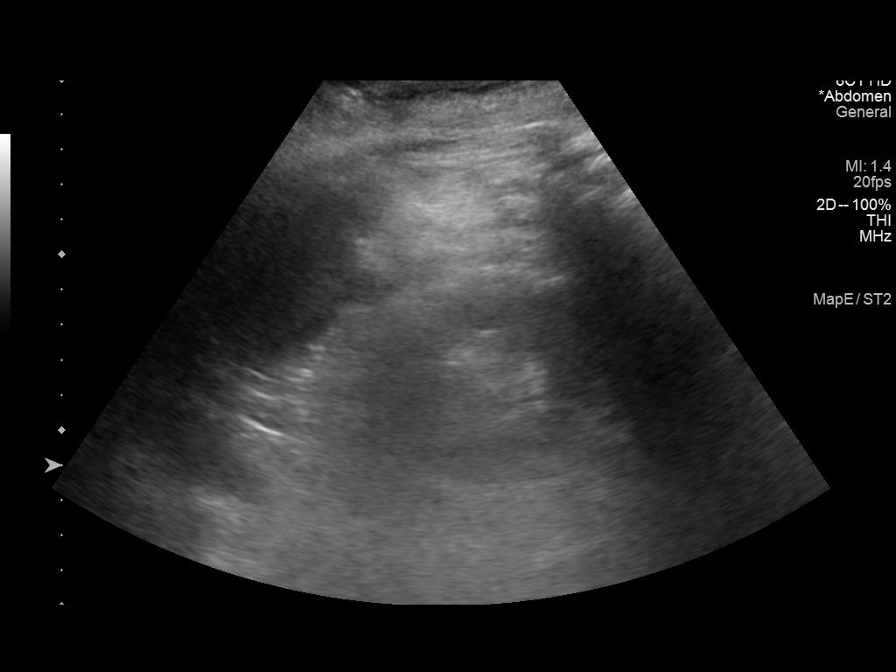
[im 5/9]
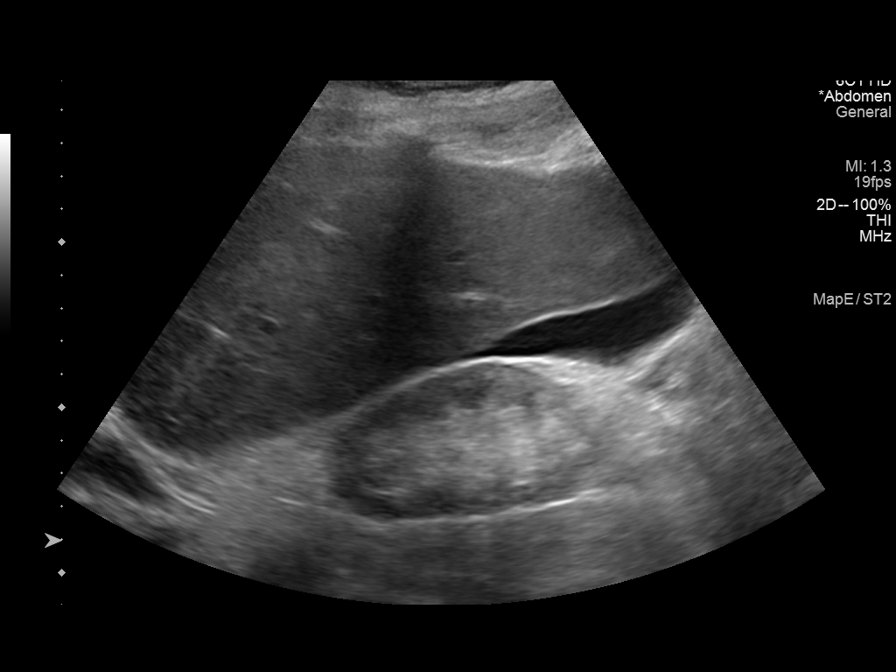
[im 6/9]
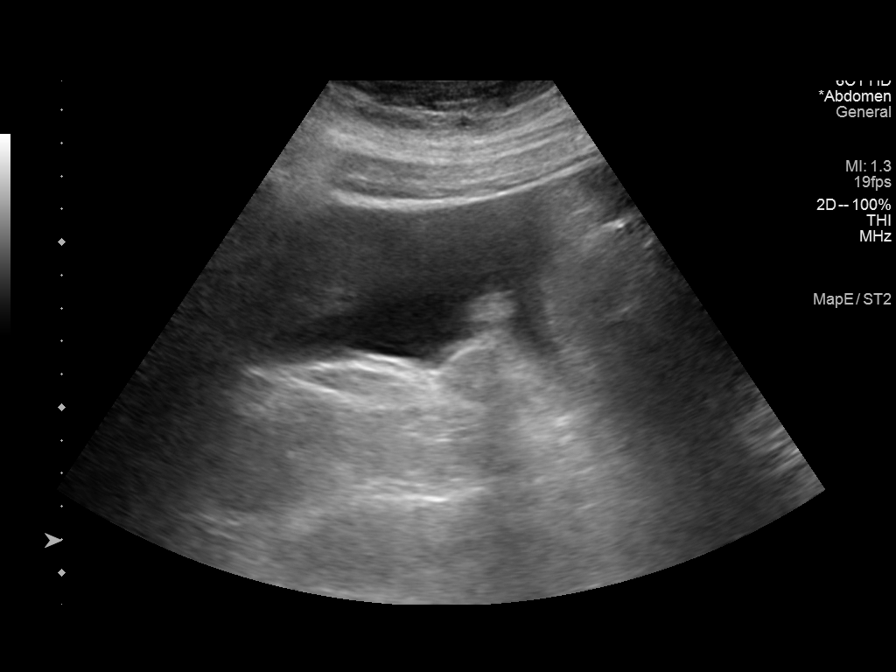
[im 7/9]
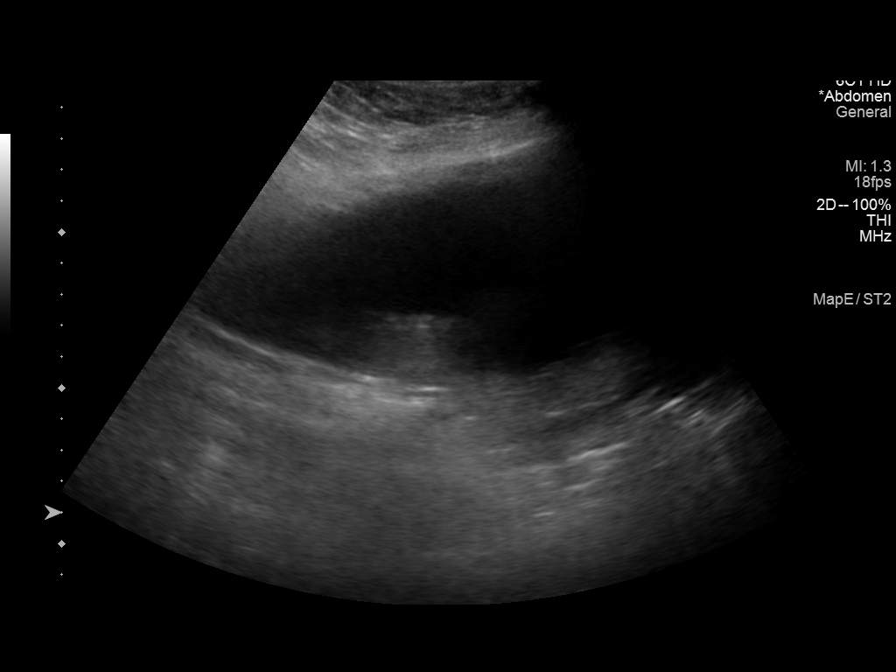
[im 8/9]
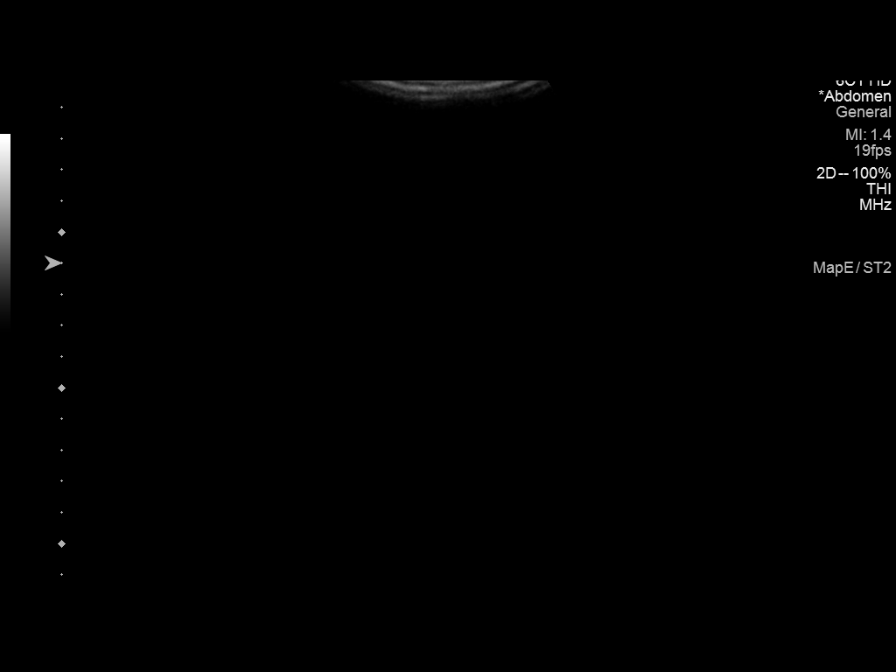
[im 9/9]
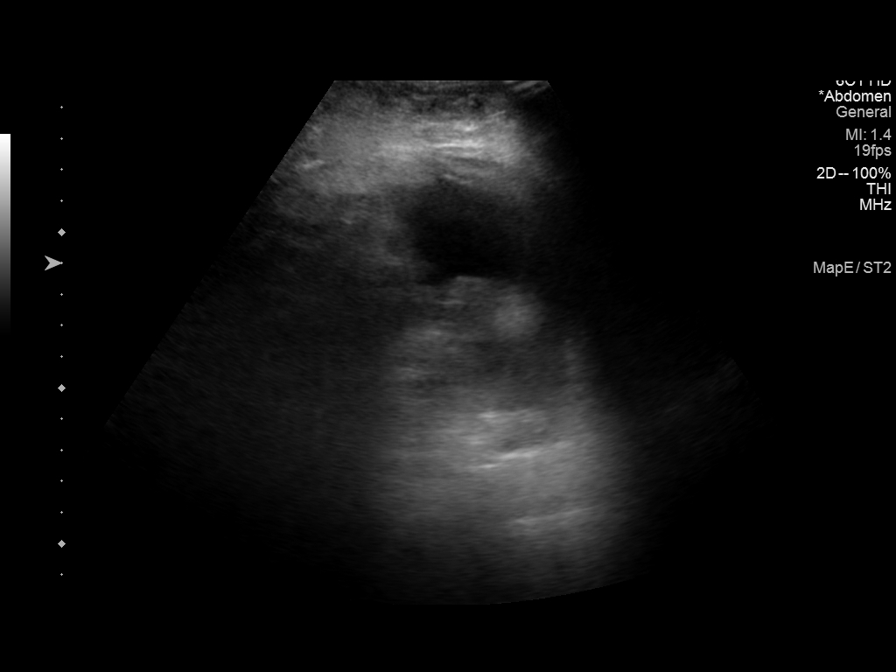

[9 of 9 positions shown; findings below may reference images not displayed]

EXAM:
ULTRASOUND GUIDED DIAGNOSTIC AND THERAPEUTIC PARACENTESIS

MEDICATIONS:
None.

COMPLICATIONS:
None immediate.

PROCEDURE:
Informed written consent was obtained from the patient after a
discussion of the risks, benefits and alternatives to treatment. A
timeout was performed prior to the initiation of the procedure.

Initial ultrasound scanning demonstrates a small to moderate amount
of ascites within the left mid to lower lower abdominal quadrant.
The left mid to lower lower abdomen was prepped and draped in the
usual sterile fashion. 1% lidocaine was used for local anesthesia.

Following this, a Yueh catheter was introduced. An ultrasound image
was saved for documentation purposes. The paracentesis was
performed. The catheter was removed and a dressing was applied. The
patient tolerated the procedure well without immediate post
procedural complication.
FINDINGS: A total of approximately 1.1 liters of yellow fluid was removed.
Samples were sent to the laboratory as requested by the clinical
team.
IMPRESSION: Successful ultrasound-guided diagnostic and therapeutic paracentesis
yielding 1.1 liters of peritoneal fluid.

## 2018-03-13 IMAGING — CT CT ABD-PELV W/O CM
1 of 4 series · 7 of 46 positions shown, 12 images · non-contrast
Comparison: CT dated 11/14/2015 and ultrasound dated 11/15/2015

CLINICAL DATA: 85-year-old female with acute abdominal pain.
History of pancreatic mass. Patient feels nauseous.

EXAM:
CT ABDOMEN AND PELVIS WITHOUT CONTRAST
TECHNIQUE: Multidetector CT imaging of the abdomen and pelvis was performed
following the standard protocol without IV contrast.

[Series 206: lungs · axial · 0.78mm/px · z∈[-737,-675]mm · 7 of 35 slices shown, 12 images]
[im 5/35  soft-tissue]
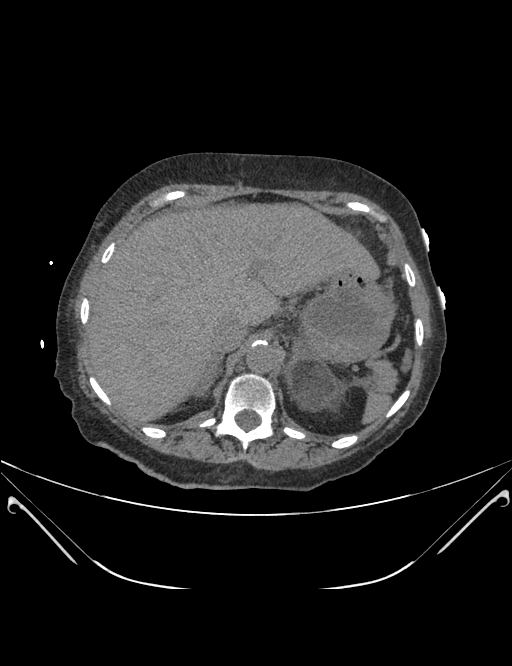
[im 5/35  bone]
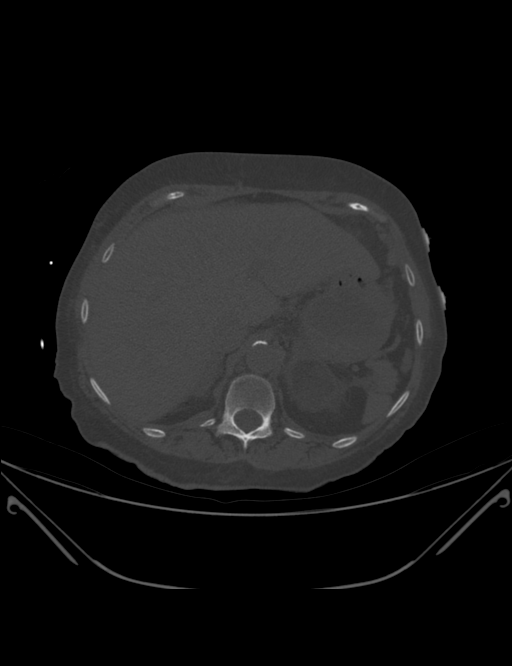
[im 9/35  soft-tissue]
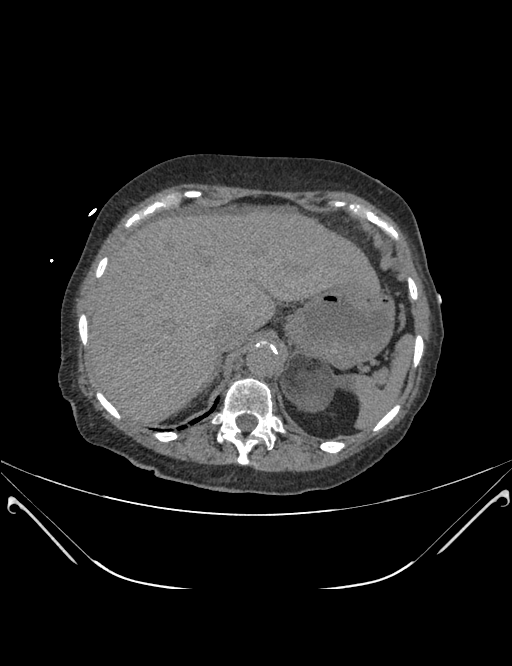
[im 13/35  soft-tissue]
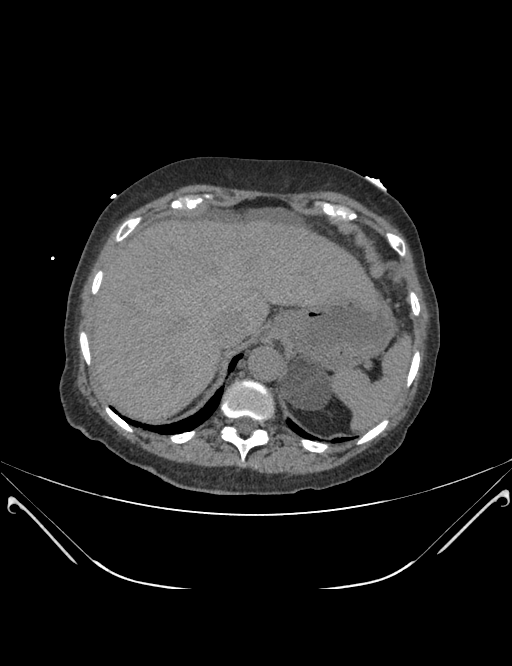
[im 18/35  soft-tissue]
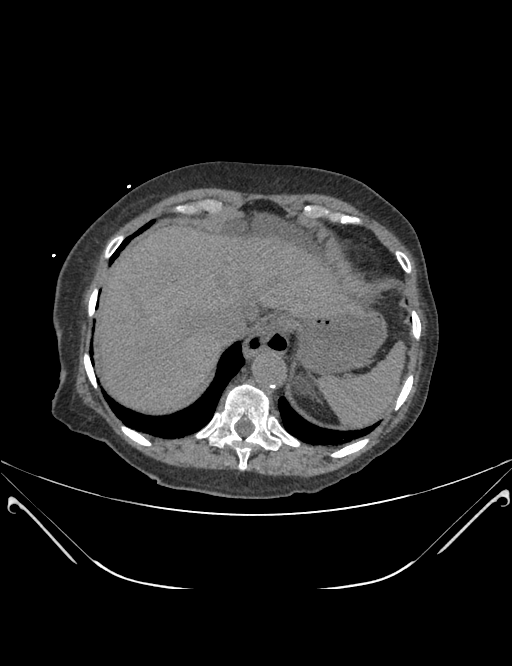
[im 18/35  lung]
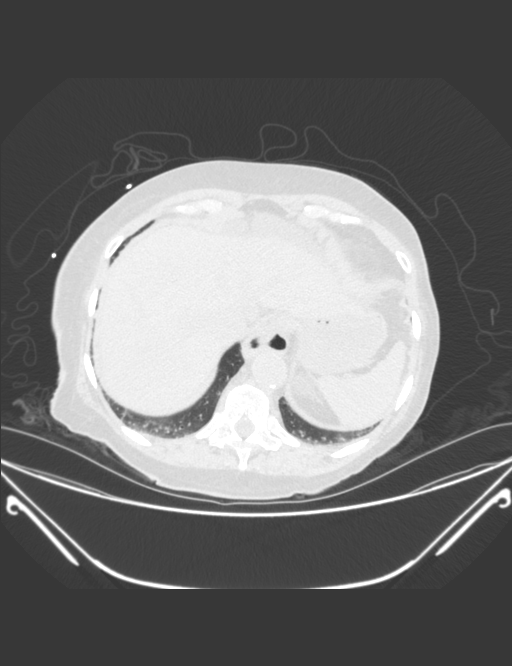
[im 22/35  soft-tissue]
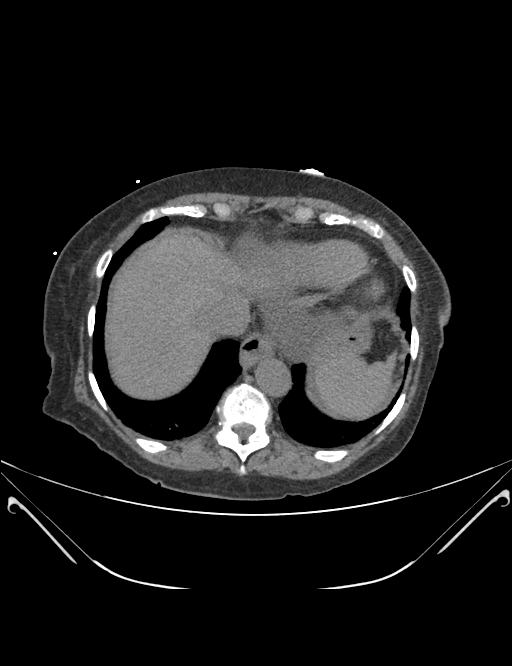
[im 22/35  lung]
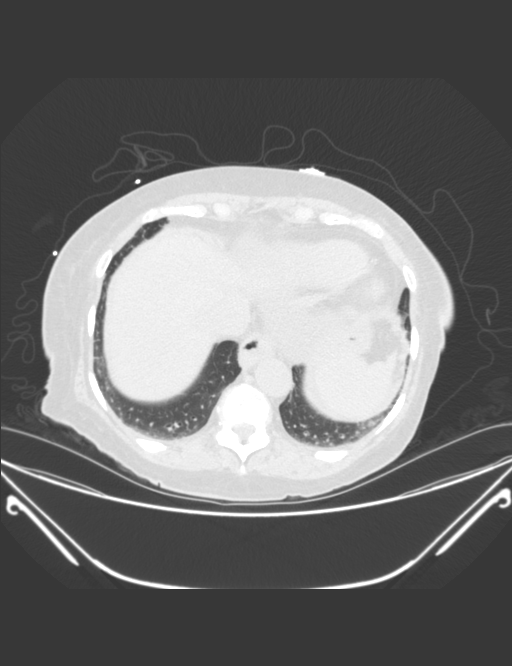
[im 26/35  soft-tissue]
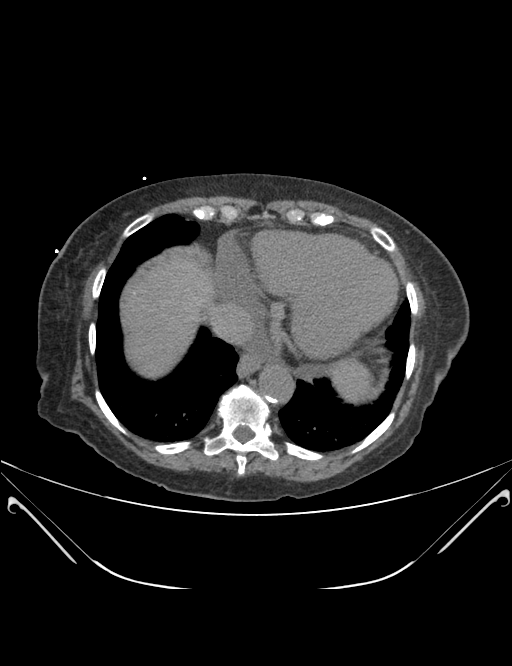
[im 26/35  lung]
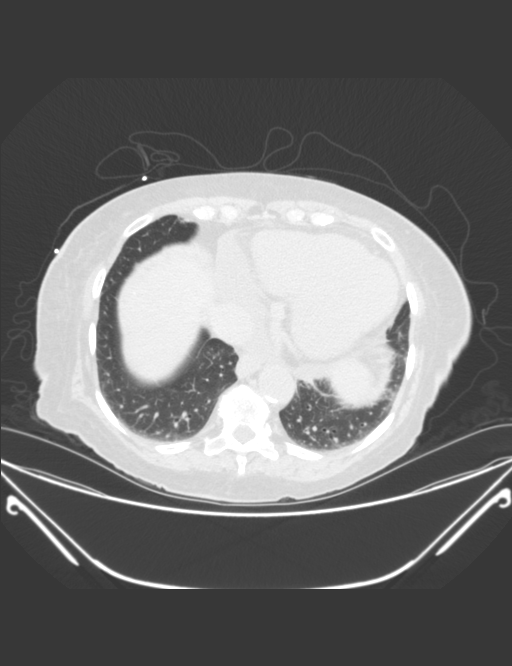
[im 30/35  soft-tissue]
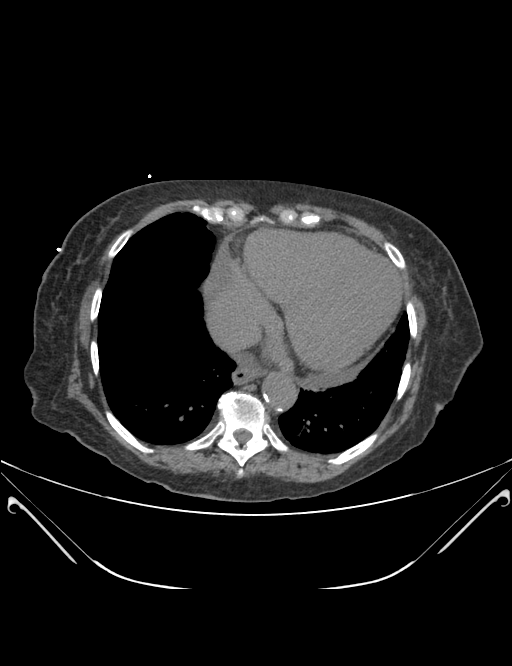
[im 30/35  lung]
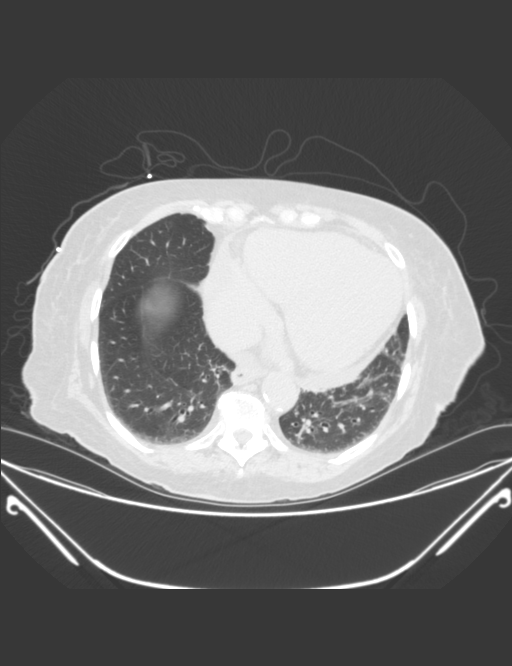

[7 of 46 positions shown; findings below may reference images not displayed]

FINDINGS: Evaluation of this exam is limited in the absence of intravenous
contrast.

Lower chest: Minimal left lung base atelectatic changes.
Cardiomegaly with partially visualized small pericardial effusion.

No intra-abdominal free air. Small ascites, decreased from prior
study.

Hepatobiliary: Irregularity of the hepatic contour may represent
underlying cirrhosis. Clinical correlation is recommended. No
intrahepatic biliary ductal dilatation. Mild periportal edema. The
gallbladder is unremarkable.

Pancreas: There is 4.5 x 5.0 cm (previously 4.8 x 5.0 cm) low
attenuating mass in the body of the pancreas. There is no associated
gland atrophy or dilatation of the main pancreatic duct.

Spleen: Normal in size without focal abnormality.

Adrenals/Urinary Tract: There is a 4.8 x 4.1 cm low attenuating
lesion arising from the left adrenal gland. This lesion demonstrates
macroscopic fat and may represent myelo lipoma. Other etiologies are
not excluded. This is similar to the prior CT. The right adrenal
gland appears unremarkable. There is mild bilateral renal atrophy. A
1.5 cm left renal interpolar hypodense lesion is not well
characterized but likely represents a cyst. There is mild right
hydronephrosis. A 5 mm calculus along the course of the proximal
right ureter appears in stable positioning as the prior CT and
likely represents vascular calcification. No definite renal or
ureteral calculi identified. There is no hydronephrosis on the left.
The urinary bladder is unremarkable.

Stomach/Bowel: There is extensive sigmoid and colonic diverticulosis
without active inflammatory changes. No evidence of bowel
obstruction. Multiple nondilated fluid-filled loops of small bowel
throughout the abdomen may be physiologic or represent enteritis.
The appendix is not visualized with certainty. No inflammatory
changes identified in the right lower quadrant.

Vascular/Lymphatic: There is moderate aortoiliac atherosclerotic
disease. The abdominal aorta and IVC are otherwise grossly
unremarkable on this noncontrast study. No portal venous gas
identified. There is no adenopathy.

Reproductive: Hysterectomy.

Other: Midline vertical anterior pelvic wall incisional scar. Mild
diffuse subcutaneous stranding and edema.

Musculoskeletal: Multilevel degenerative changes of the spine.
Multilevel disc desiccation with vacuum phenomena. No acute
fracture.
IMPRESSION: Nondistended fluid-filled loops of small bowel may represent
enteritis. Clinical correlation is recommended. No bowel
obstruction.

Colonic diverticulosis without active inflammatory changes.

Hypo attenuating mass in the body of the pancreas similar to the
prior CT. No gland atrophy or ductal dilatation.

Left adrenal fat containing lesion may represent myelo lipoma. Other
etiologies are not excluded.

Mild right hydronephrosis no stone identified. Correlation with
urinalysis recommended to exclude UTI.

Small ascites decreased from prior study.

Possible cirrhosis.  Clinical correlation is recommended.

## 2018-03-16 IMAGING — CR DG ABD PORTABLE 1V
1 series · 1 of 1 positions shown · non-contrast
Comparison: CT 12/18/2015.

CLINICAL DATA: Nausea vomiting.  Constipation.

EXAM:
PORTABLE ABDOMEN - 1 VIEW

[AP]
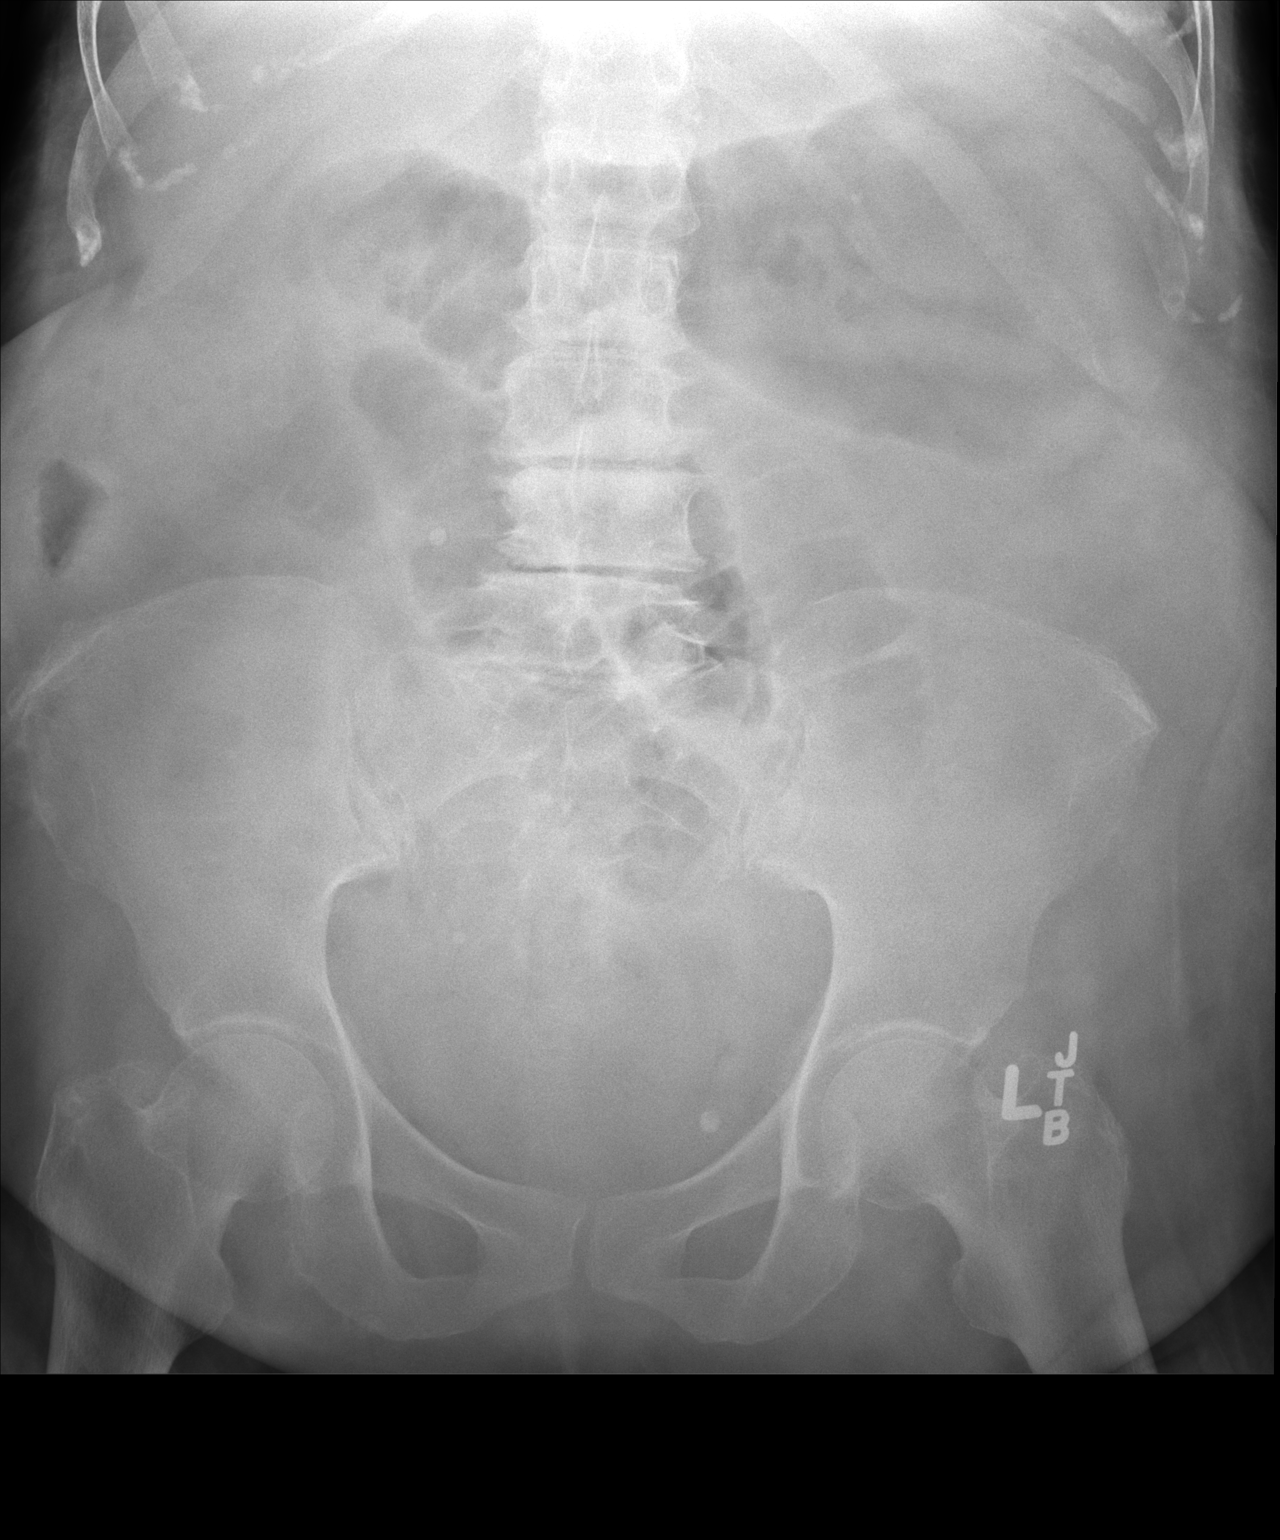

[1 of 1 positions shown; findings below may reference images not displayed]

FINDINGS: Soft tissue structures are unremarkable. Mild gastric distention.
Prominent dilated loops of proximal small bowel are noted.
Small-bowel obstruction cannot be excluded. Abdominal and pelvic
calcifications consistent with phleboliths. Degenerative changes
lumbar spine and both hips.
IMPRESSION: Mild gastric distention. Prominent dilated loops of proximal small
bowel noted. Small-bowel obstruction cannot be excluded.

## 2018-03-16 IMAGING — CR DG ABD PORTABLE 1V
1 series · 1 of 1 positions shown · non-contrast
Comparison: 12/21/2015

CLINICAL DATA: NG tube placement

EXAM:
PORTABLE ABDOMEN - 1 VIEW

[AP]
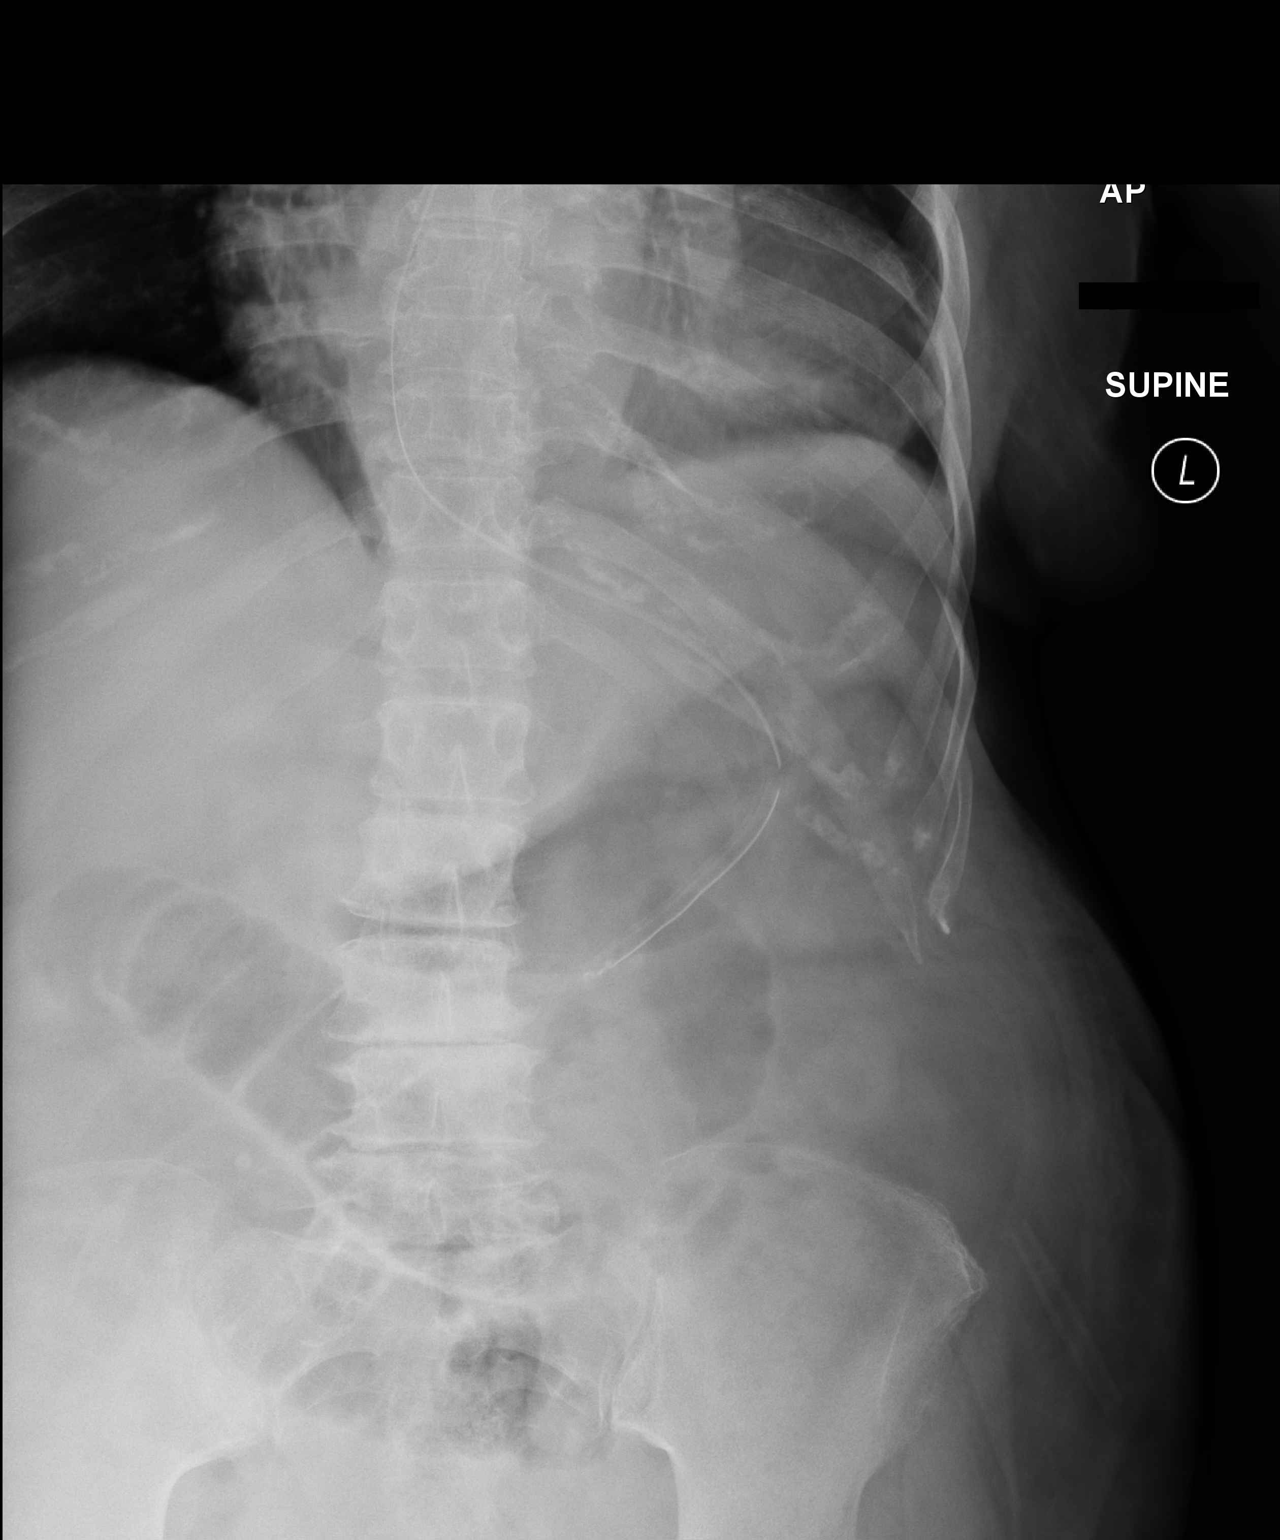

[1 of 1 positions shown; findings below may reference images not displayed]

FINDINGS: Heart size is enlarged. Esophageal tube tip overlies the mid
stomach. Persistent small bowel gaseous dilatation in the central
abdomen measuring up to 4 cm.
IMPRESSION: 1. Esophageal tube tip overlies the body of the stomach.
2. Persistent gaseous dilatation of small bowel in the central
abdomen, possible bowel obstruction.

## 2018-03-17 IMAGING — CR DG ABD PORTABLE 1V
1 series · 1 of 1 positions shown · non-contrast
Comparison: 12/21/2015

CLINICAL DATA: Small-bowel obstruction .

EXAM:
PORTABLE ABDOMEN - 1 VIEW

[AP]
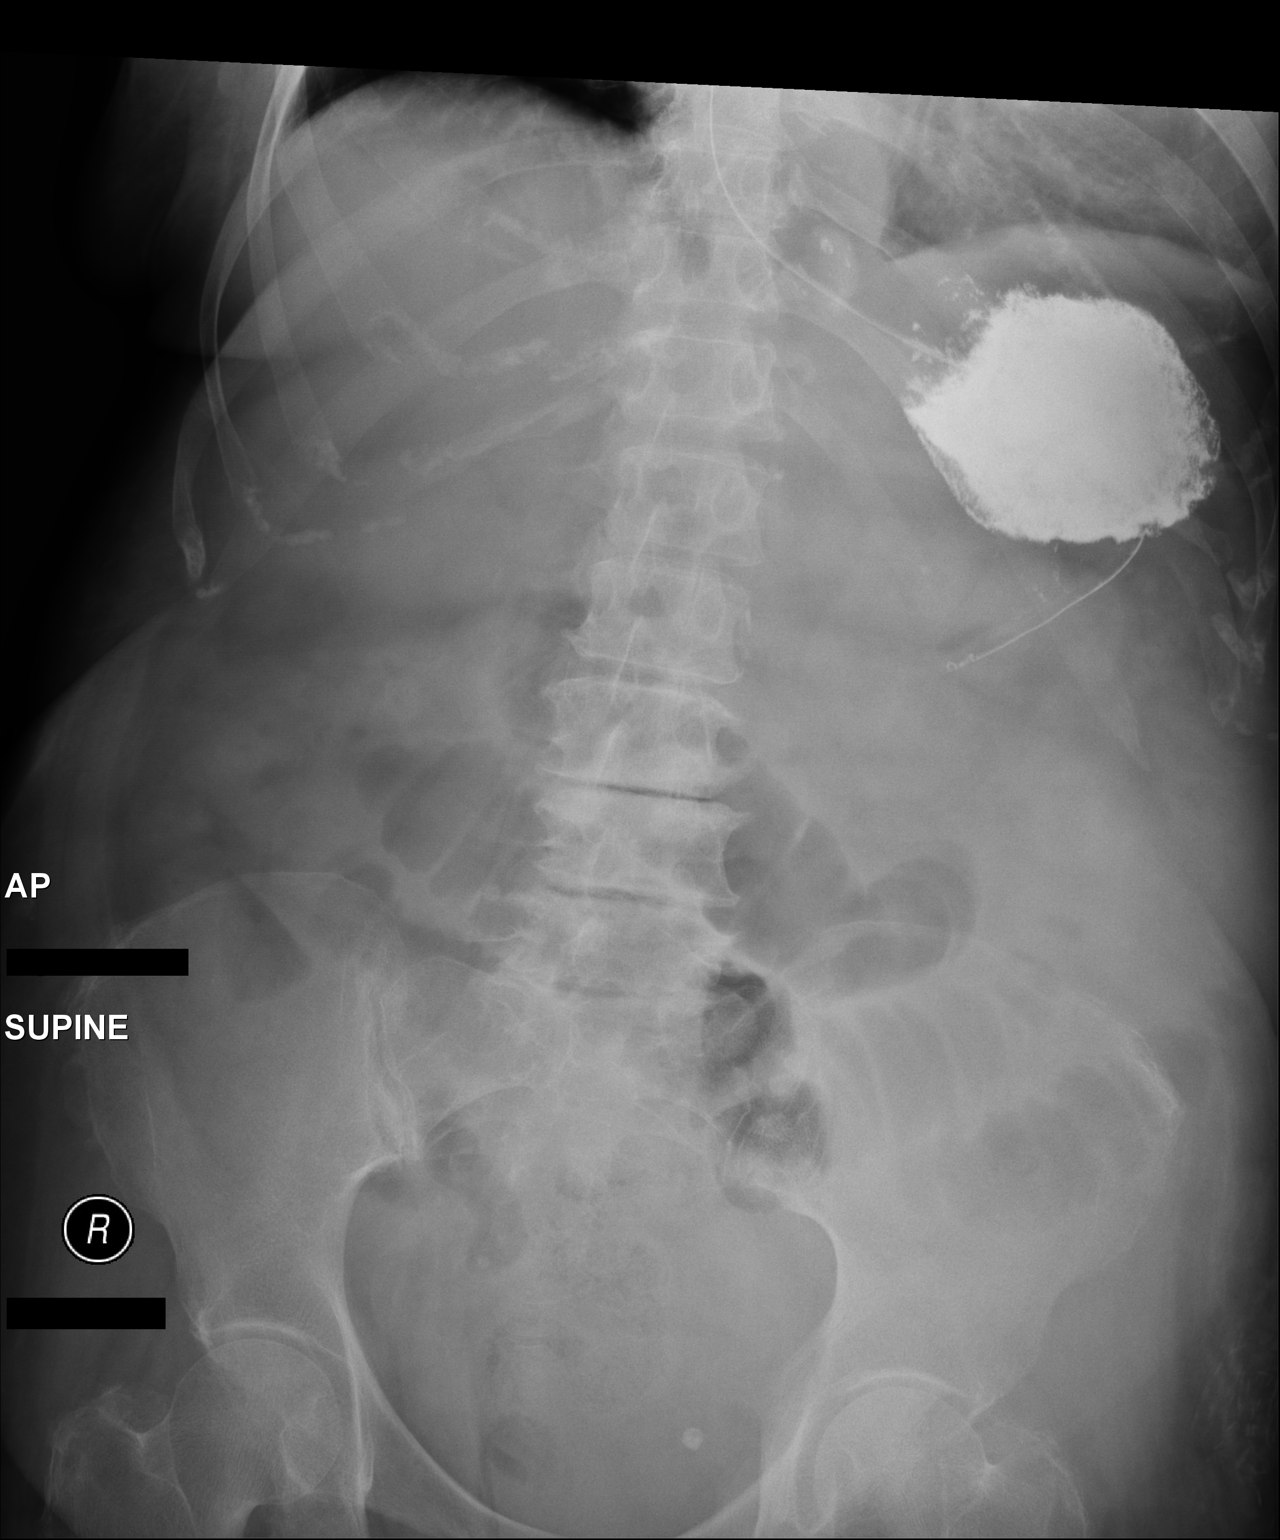

[1 of 1 positions shown; findings below may reference images not displayed]

FINDINGS: NG tube noted with tip in the stomach. Contrast noted within the
stomach. Persistent unchanged distended loops of small bowel
suggesting small bowel obstruction. No free air. Pelvic
calcifications consistent phleboliths. Degenerative changes lumbar
spine .
IMPRESSION: NG tube in stable position with its tip in the stomach. Contrast is
in the stomach.

2. Persistent unchanged distended loops of small bowel suggesting
small bowel obstruction.

## 2018-03-17 IMAGING — DX DG ABDOMEN 1V
1 series · 1 of 1 positions shown · non-contrast
Comparison: [DATE] [DATE], [DATE] [DATE] a.m..

CLINICAL DATA: Follow-up film.  NG tube present.

EXAM:
ABDOMEN - 1 VIEW

[t abdomen supine]
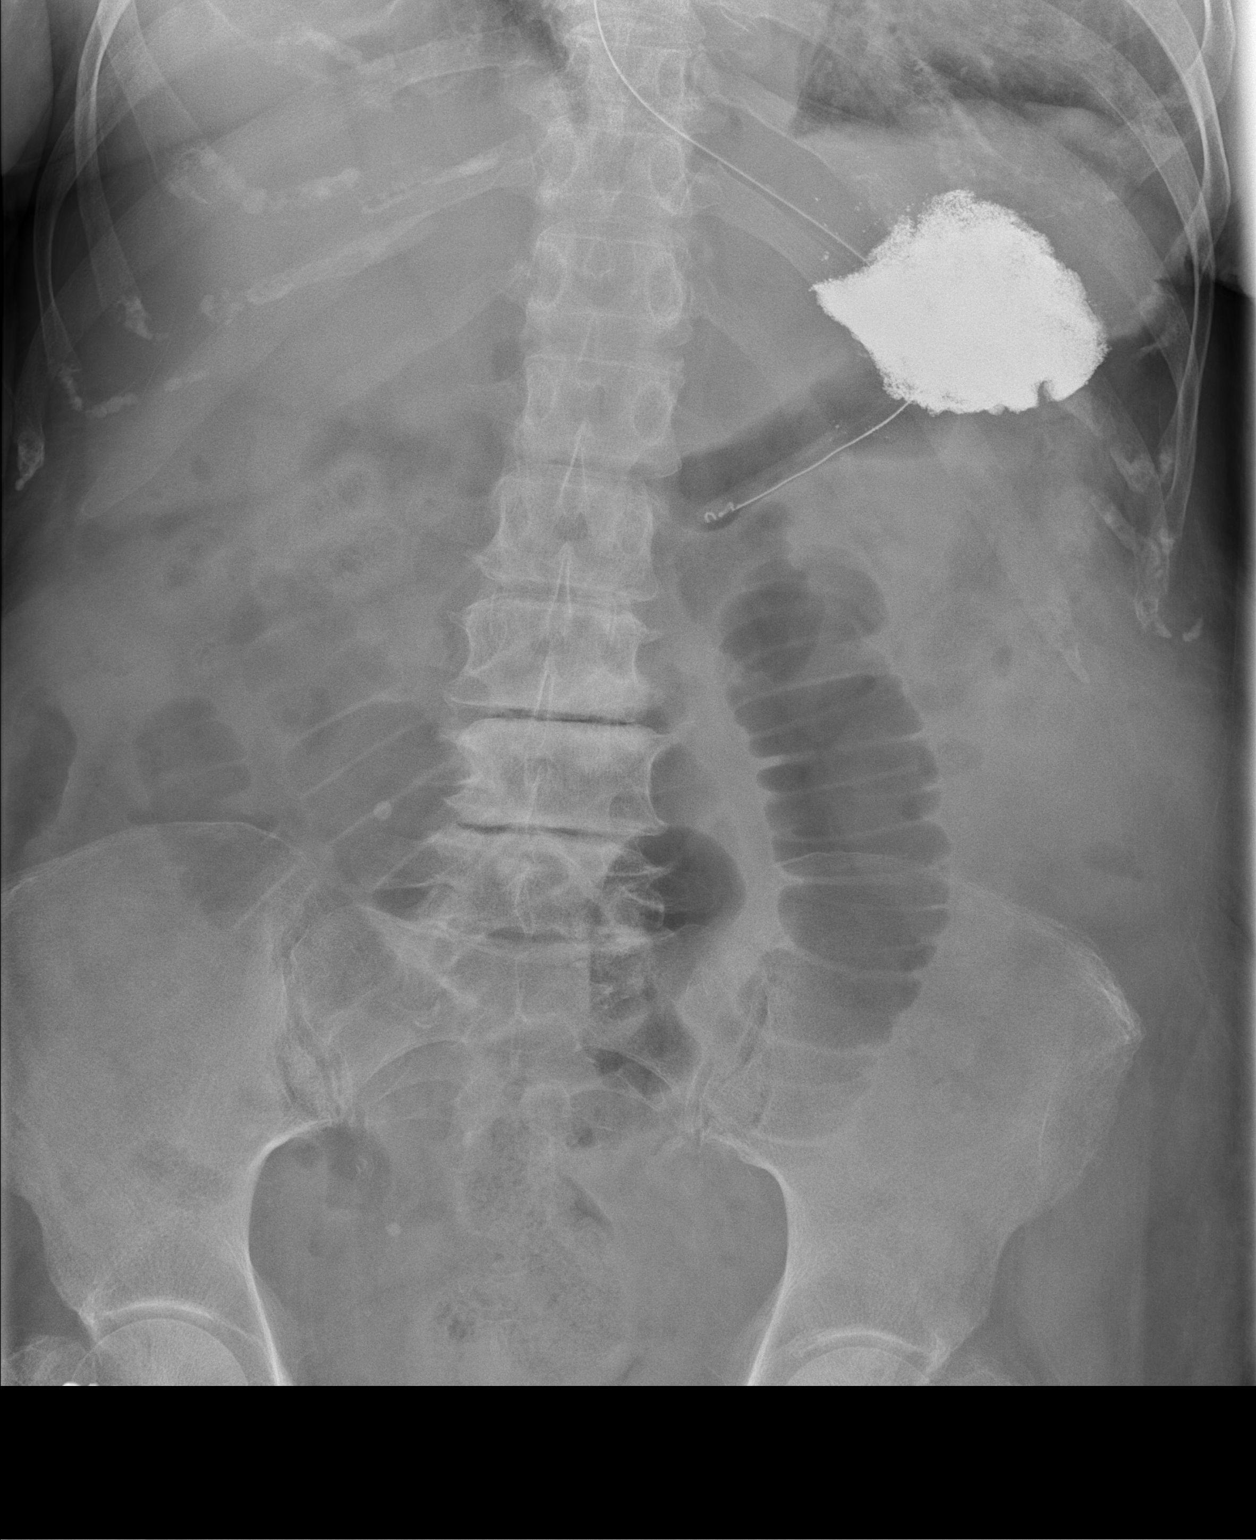

[1 of 1 positions shown; findings below may reference images not displayed]

FINDINGS: Air-filled dilated small bowel loops are identified slightly more
prominent compared to prior exam. A NG tube is identified distal tip
in the distal stomach. Degenerative joint changes of the spine are
identified.
IMPRESSION: NG tube is identified distal tip in the distal stomach.

Small bowel obstruction, slightly worse compared to prior exam.

## 2018-03-18 IMAGING — DX DG ABDOMEN 2V
2 series · 2 of 2 positions shown · non-contrast
Comparison: 12/22/2015

CLINICAL DATA: Abdominal pain.  Follow-up small bowel obstruction.

EXAM:
ABDOMEN - 2 VIEW

[w abdomen decub]
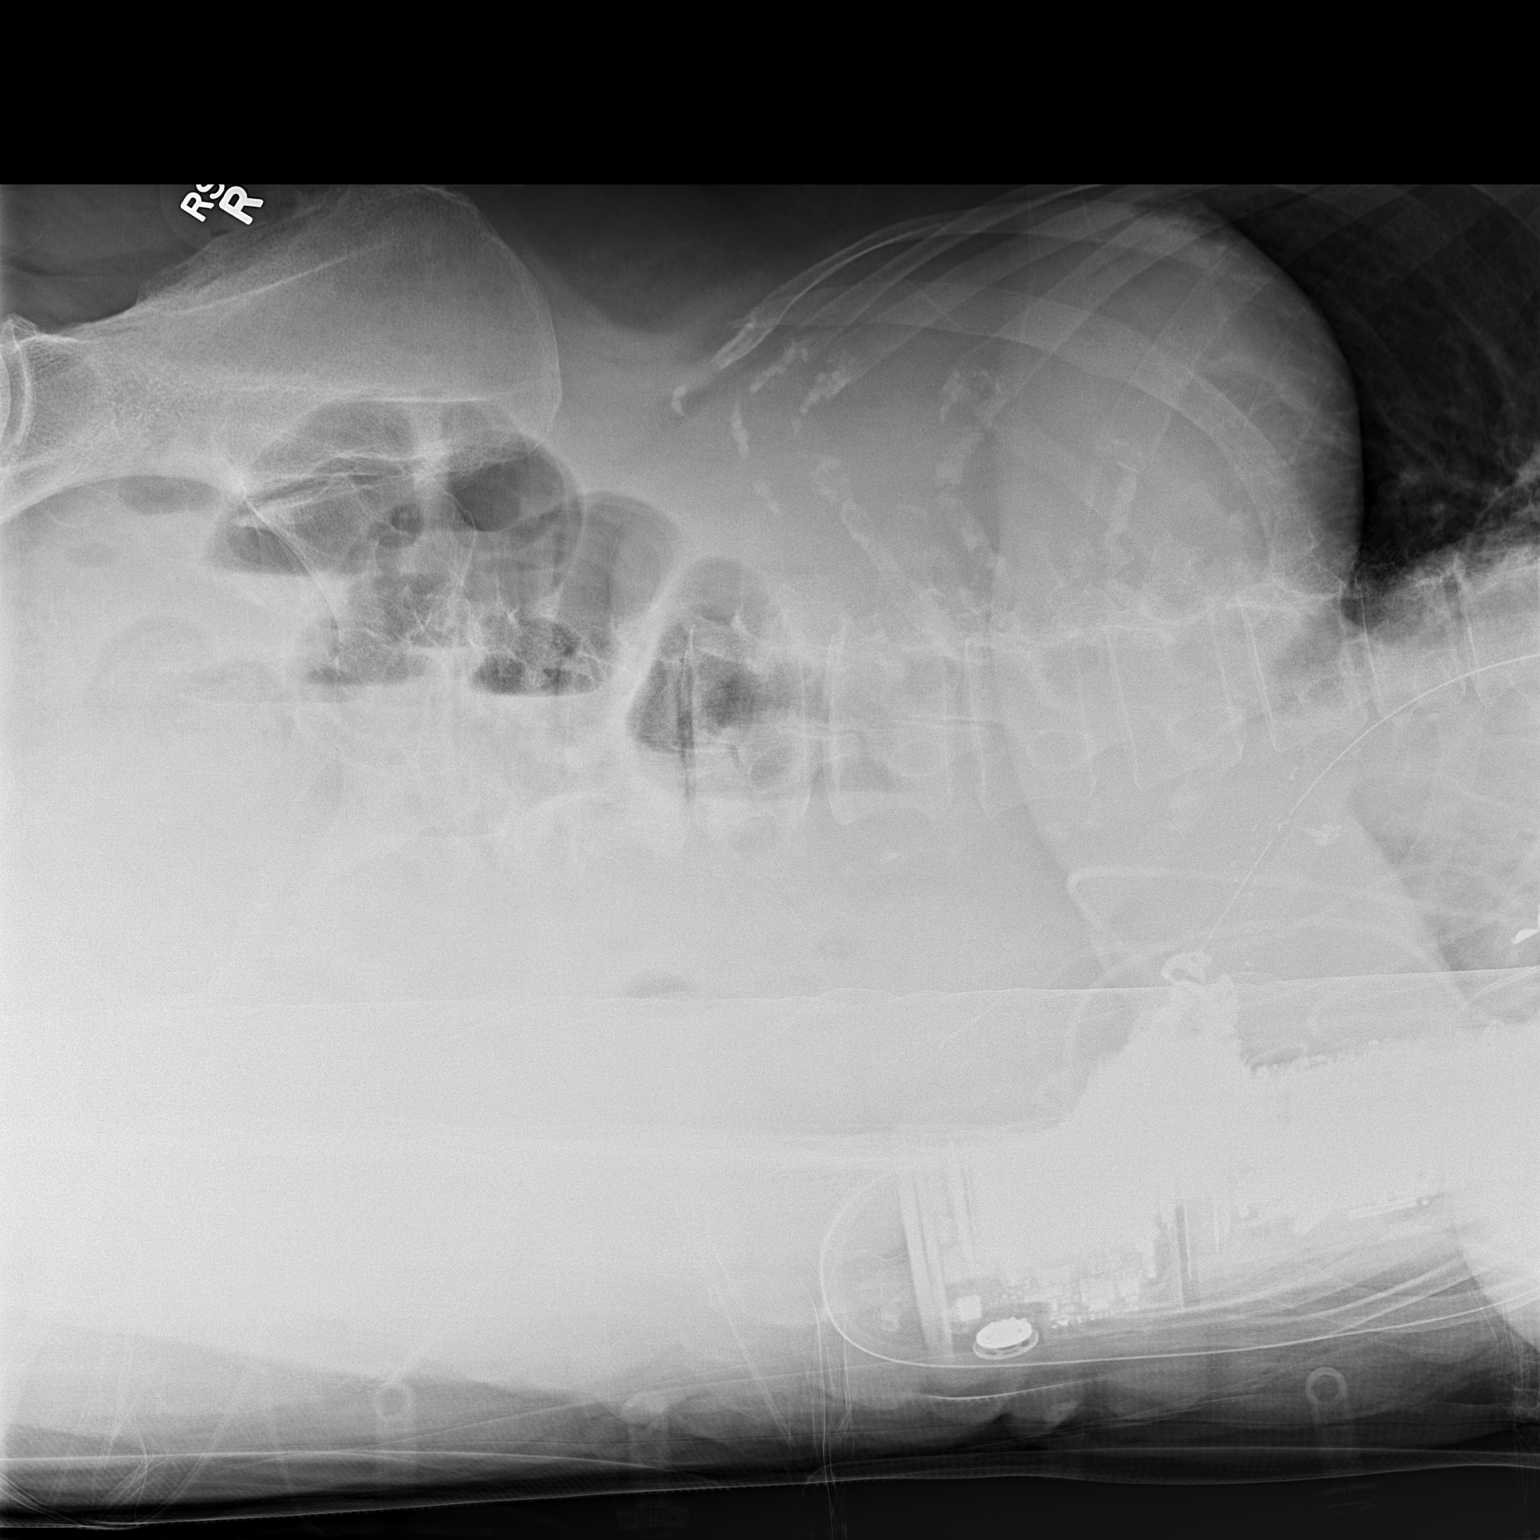

[t abdomen supine]
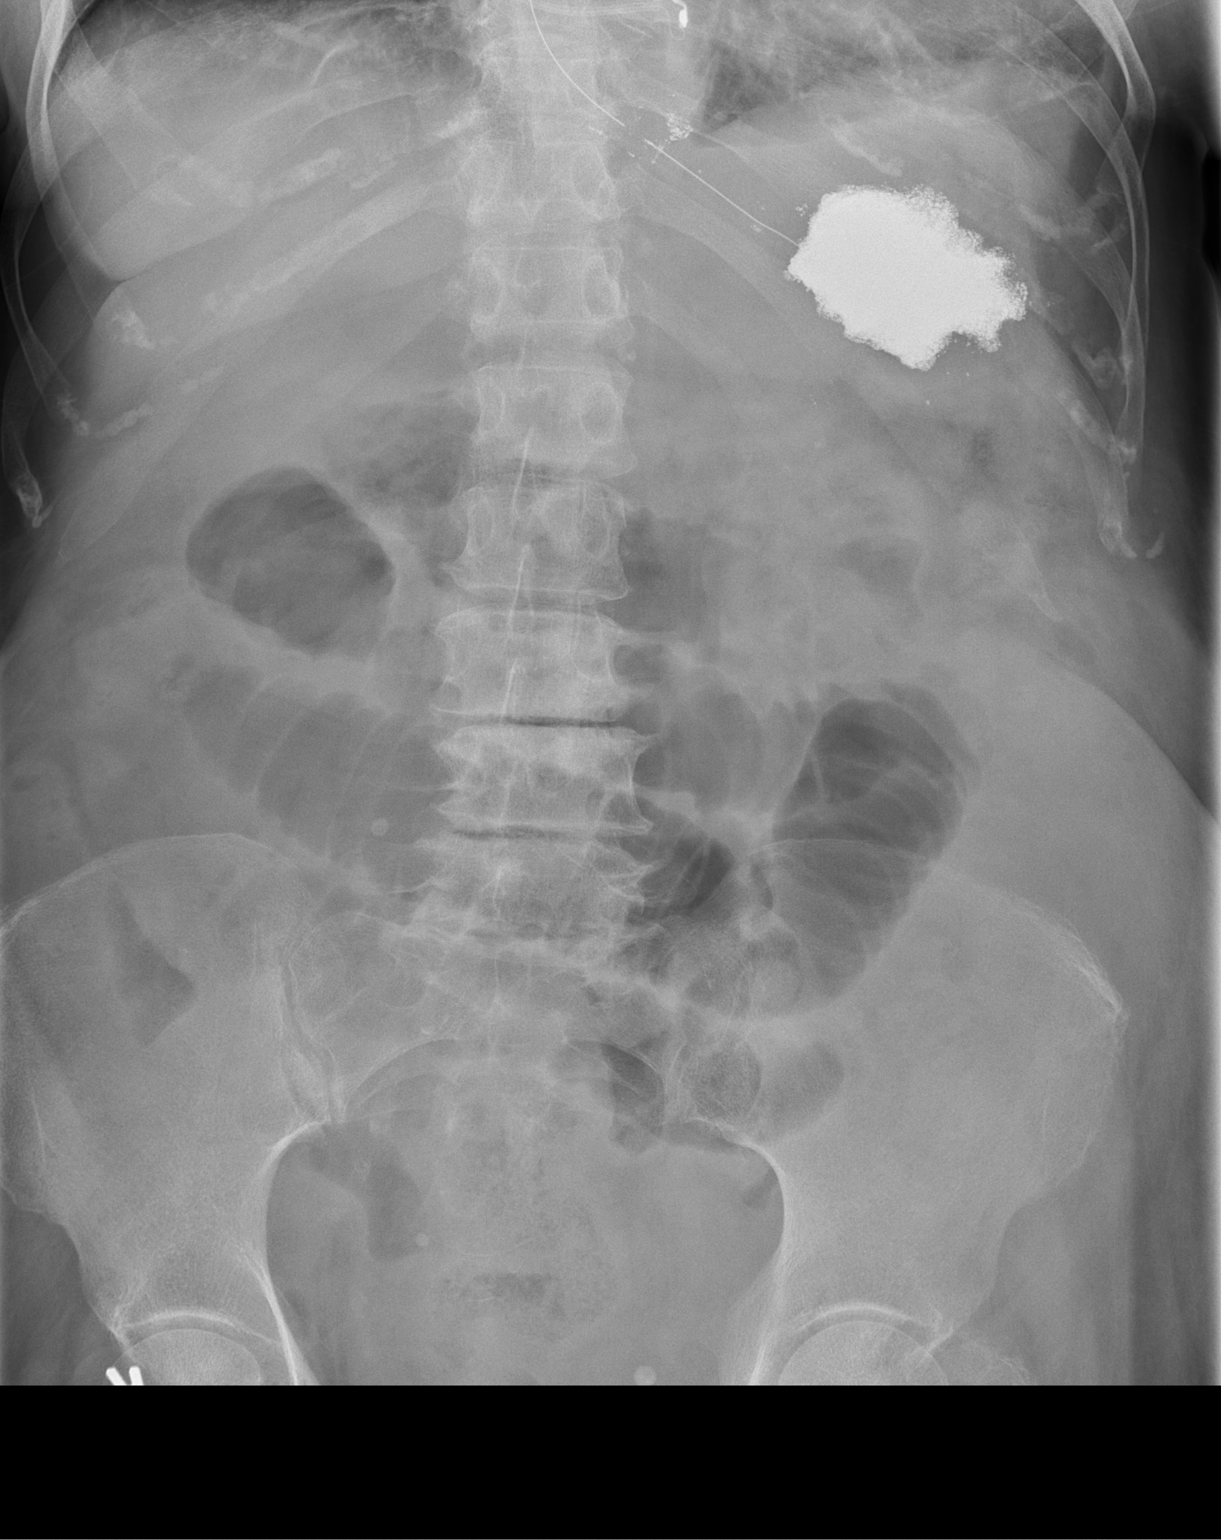

[2 of 2 positions shown; findings below may reference images not displayed]

FINDINGS: There persistent dilated loops of small bowel with limited air noted
in a nondistended colon. There is contrast projecting in the gastric
fundus unchanged from the previous day's study. Multiple air-fluid
levels are noted on the decubitus view. There is no free air.
IMPRESSION: 1. Persistent high-grade small bowel obstruction.

## 2018-09-21 IMAGING — DX DG CHEST 2V
2 series · 2 of 2 positions shown · non-contrast
Comparison: 07/22/2015

CLINICAL DATA: Cough, congestion for 1 year.

EXAM:
CHEST  2 VIEW

[chest pa]
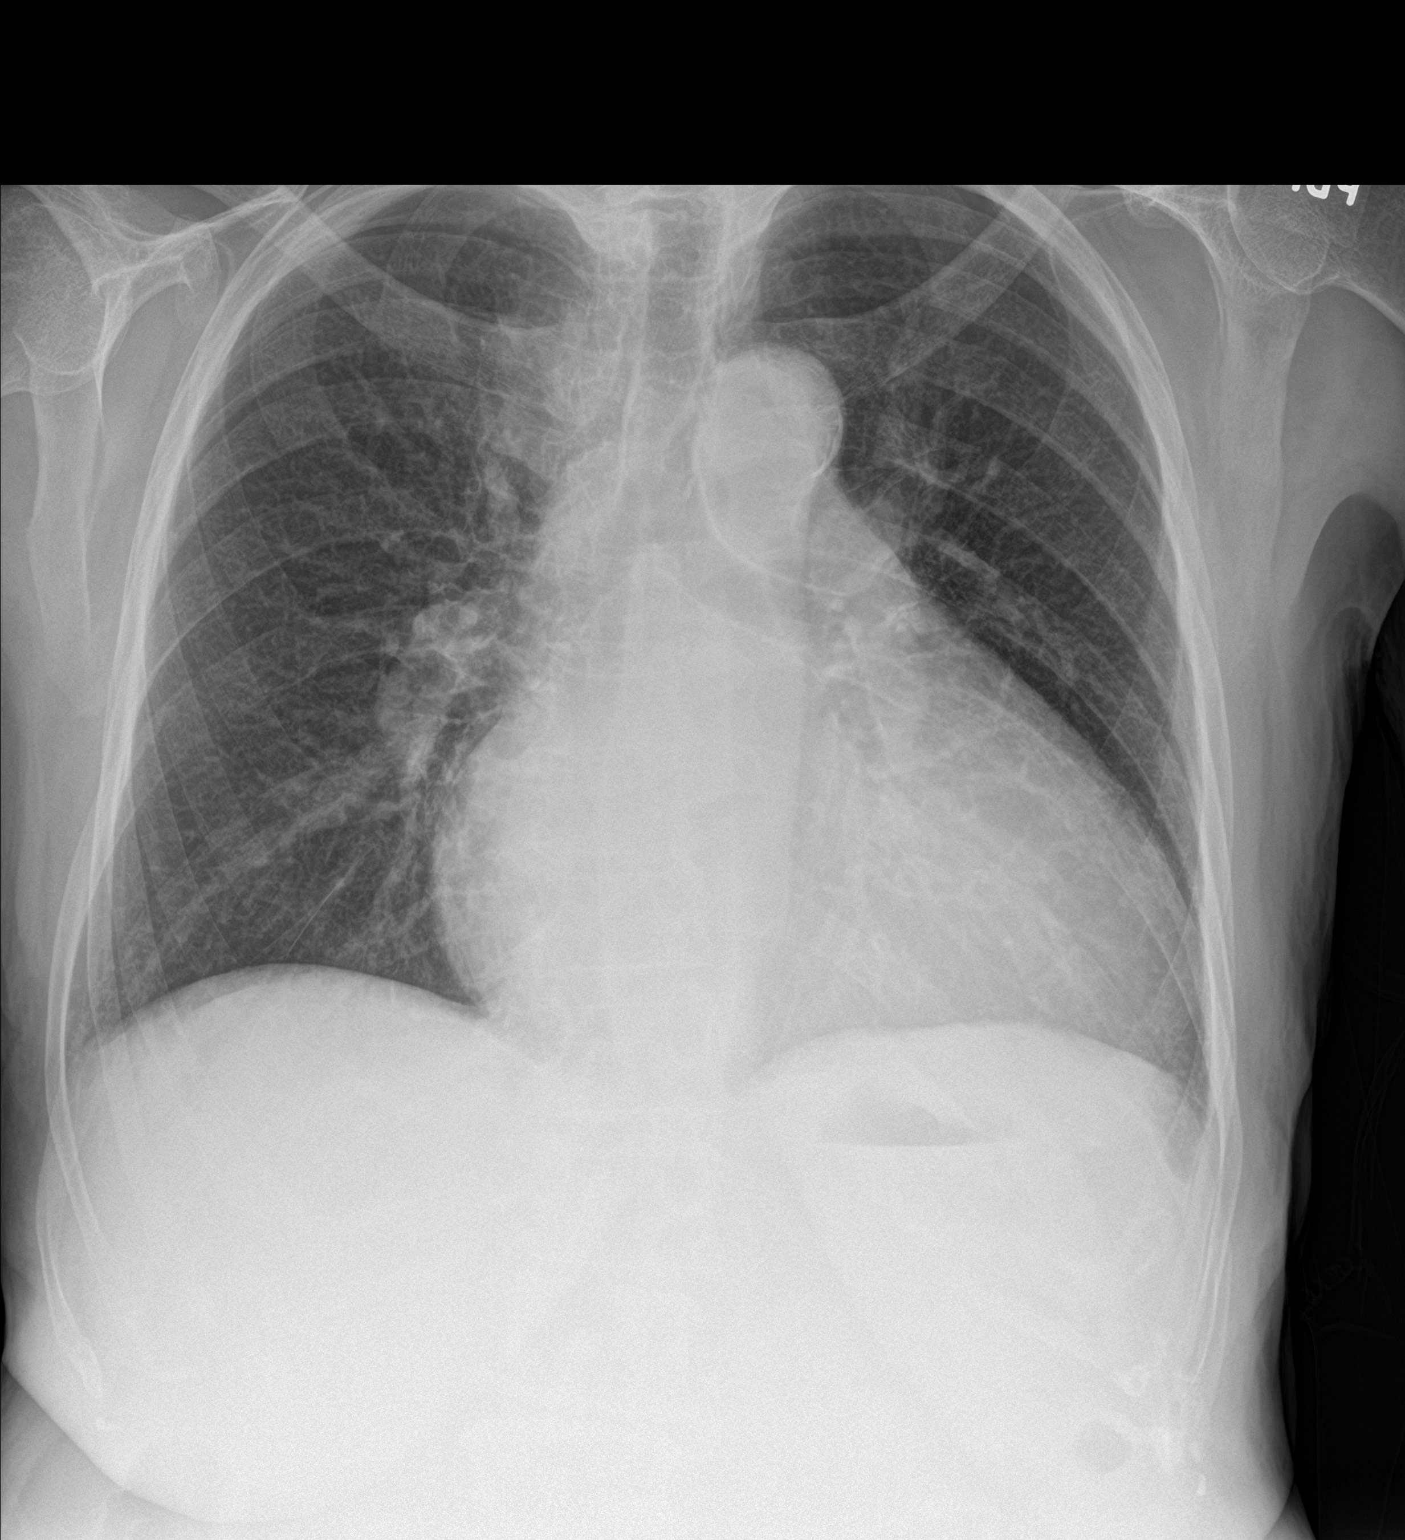

[chest lat]
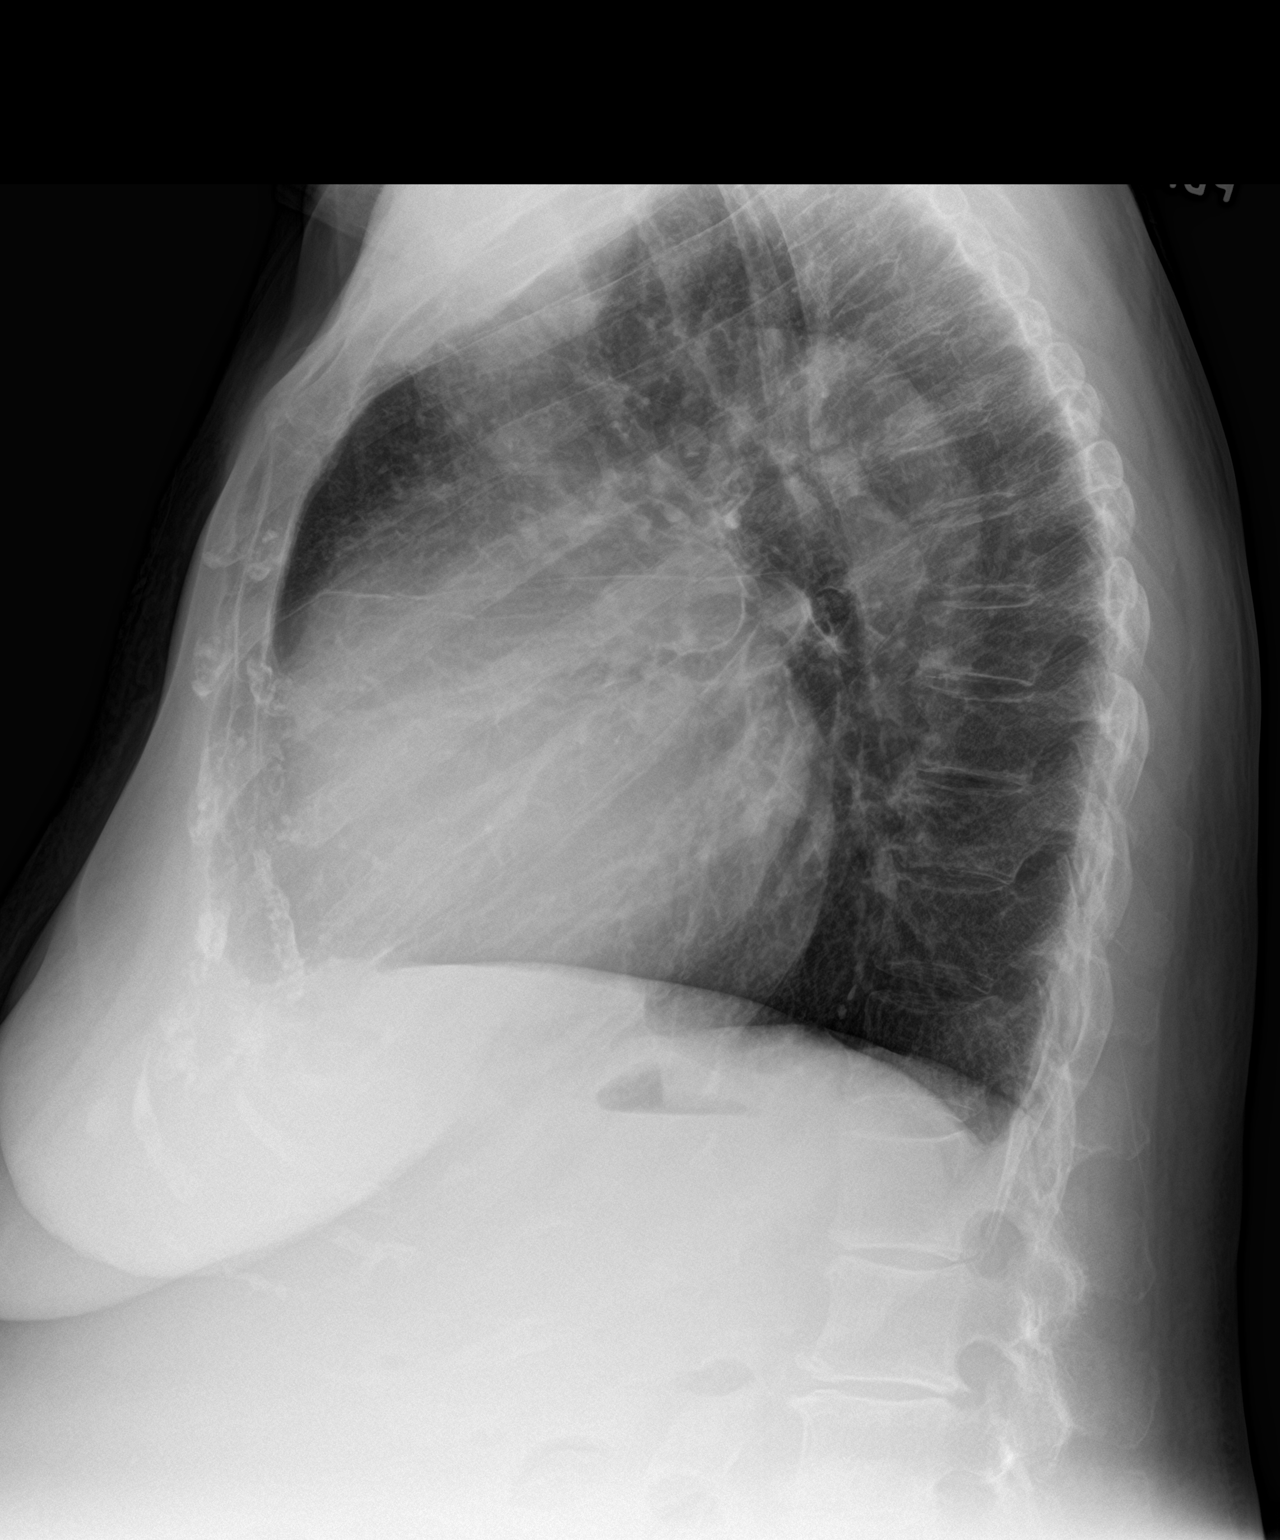

[2 of 2 positions shown; findings below may reference images not displayed]

FINDINGS: Stable cardiomegaly. Lungs are clear. No effusions or edema. No
acute bony abnormality.
IMPRESSION: Cardiomegaly.  No active disease.

## 2018-10-14 IMAGING — DX DG CHEST 1V PORT
1 series · 1 of 1 positions shown · non-contrast
Comparison: 06/27/2016

CLINICAL DATA: Status post dialysis catheter insertion

EXAM:
PORTABLE CHEST 1 VIEW

[chest ap]
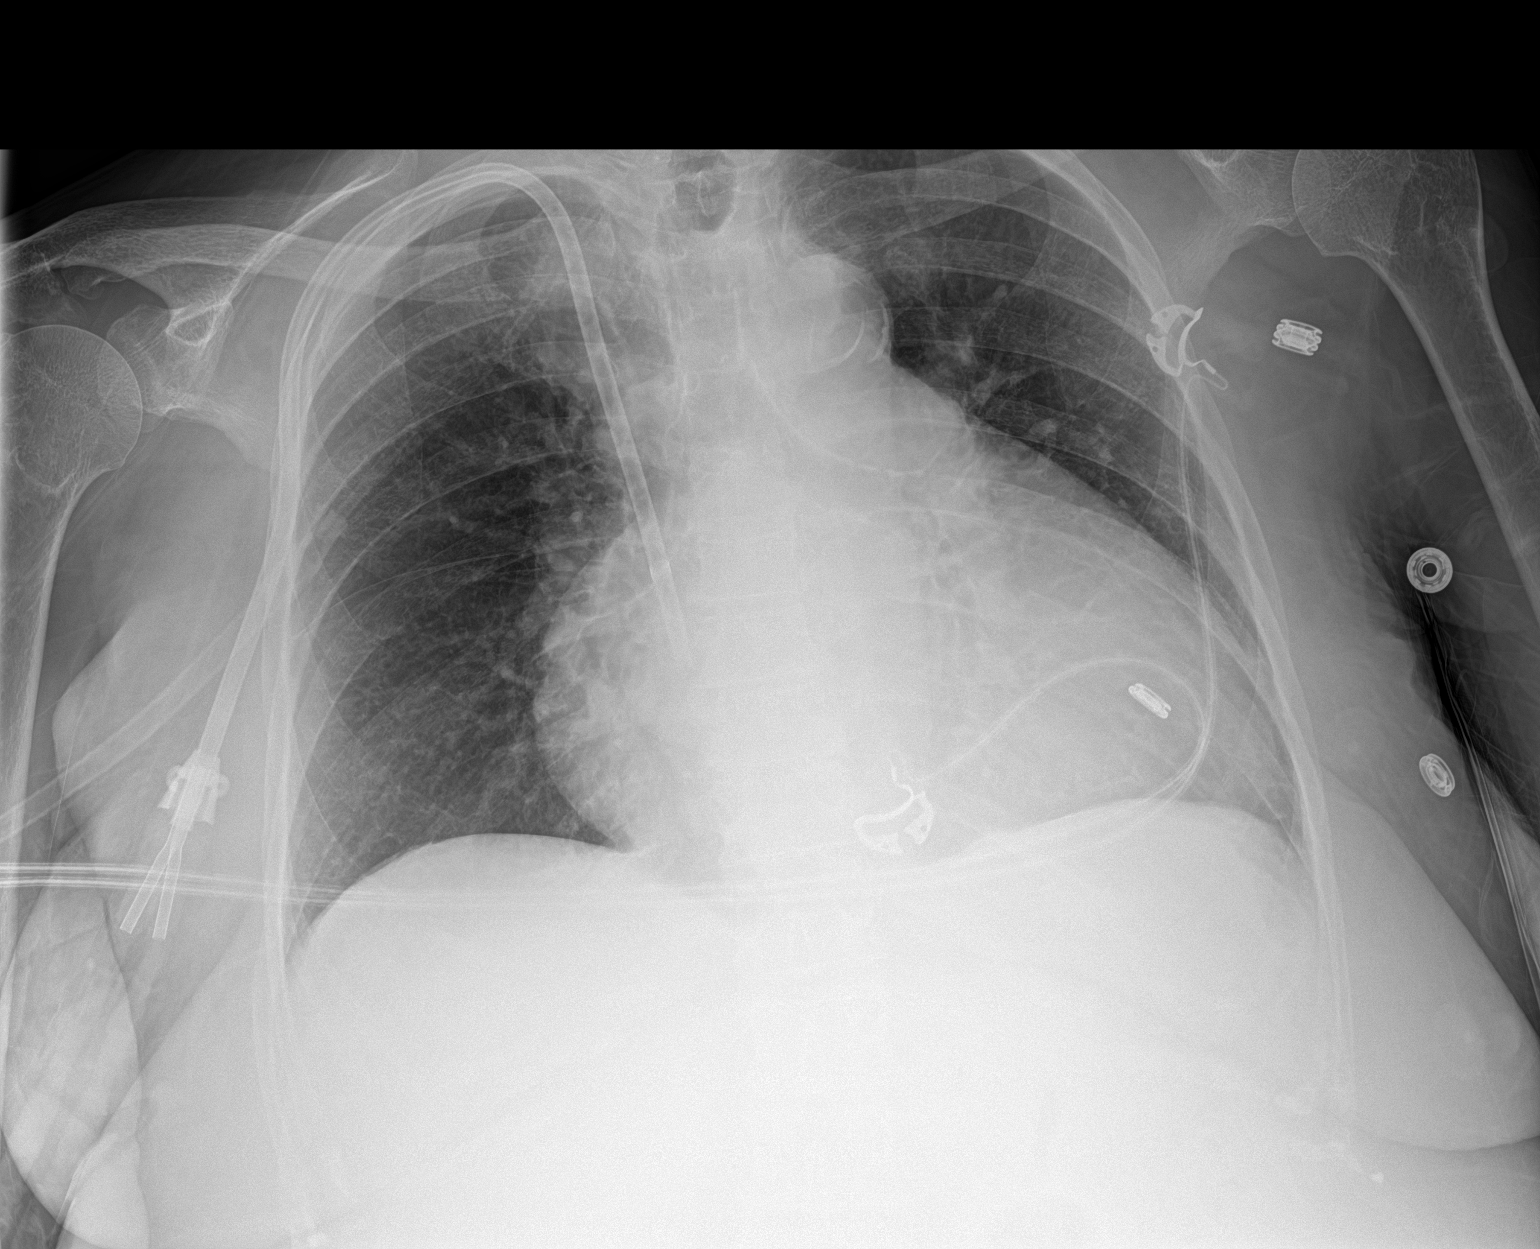

[1 of 1 positions shown; findings below may reference images not displayed]

FINDINGS: Moderate cardiomegaly without edema. Aortic atherosclerosis.
Right-sided central venous catheter tip projects over proximal right
atrium. No definitive pneumothorax is seen.
IMPRESSION: 1. Right-sided central venous catheter tip overlies the proximal
right atrium. No definitive pneumothorax.
2. Stable moderate cardiomegaly without edema or infiltrate.

## 2019-01-07 IMAGING — CR DG CHEST 2V
2 series · 2 of 2 positions shown · non-contrast
Comparison: 08/08/2016

CLINICAL DATA: No SOB, No pain in chest. CHF, non smoker, dry cough
and itch in throat since [REDACTED], HTN until she started dialysis now
she has low blood pressure and takes medication to raise her BP.
Diabetic. Insertion of dialysis catheter right side 07/20/2016.

EXAM:
CHEST  2 VIEW

[chest lat]
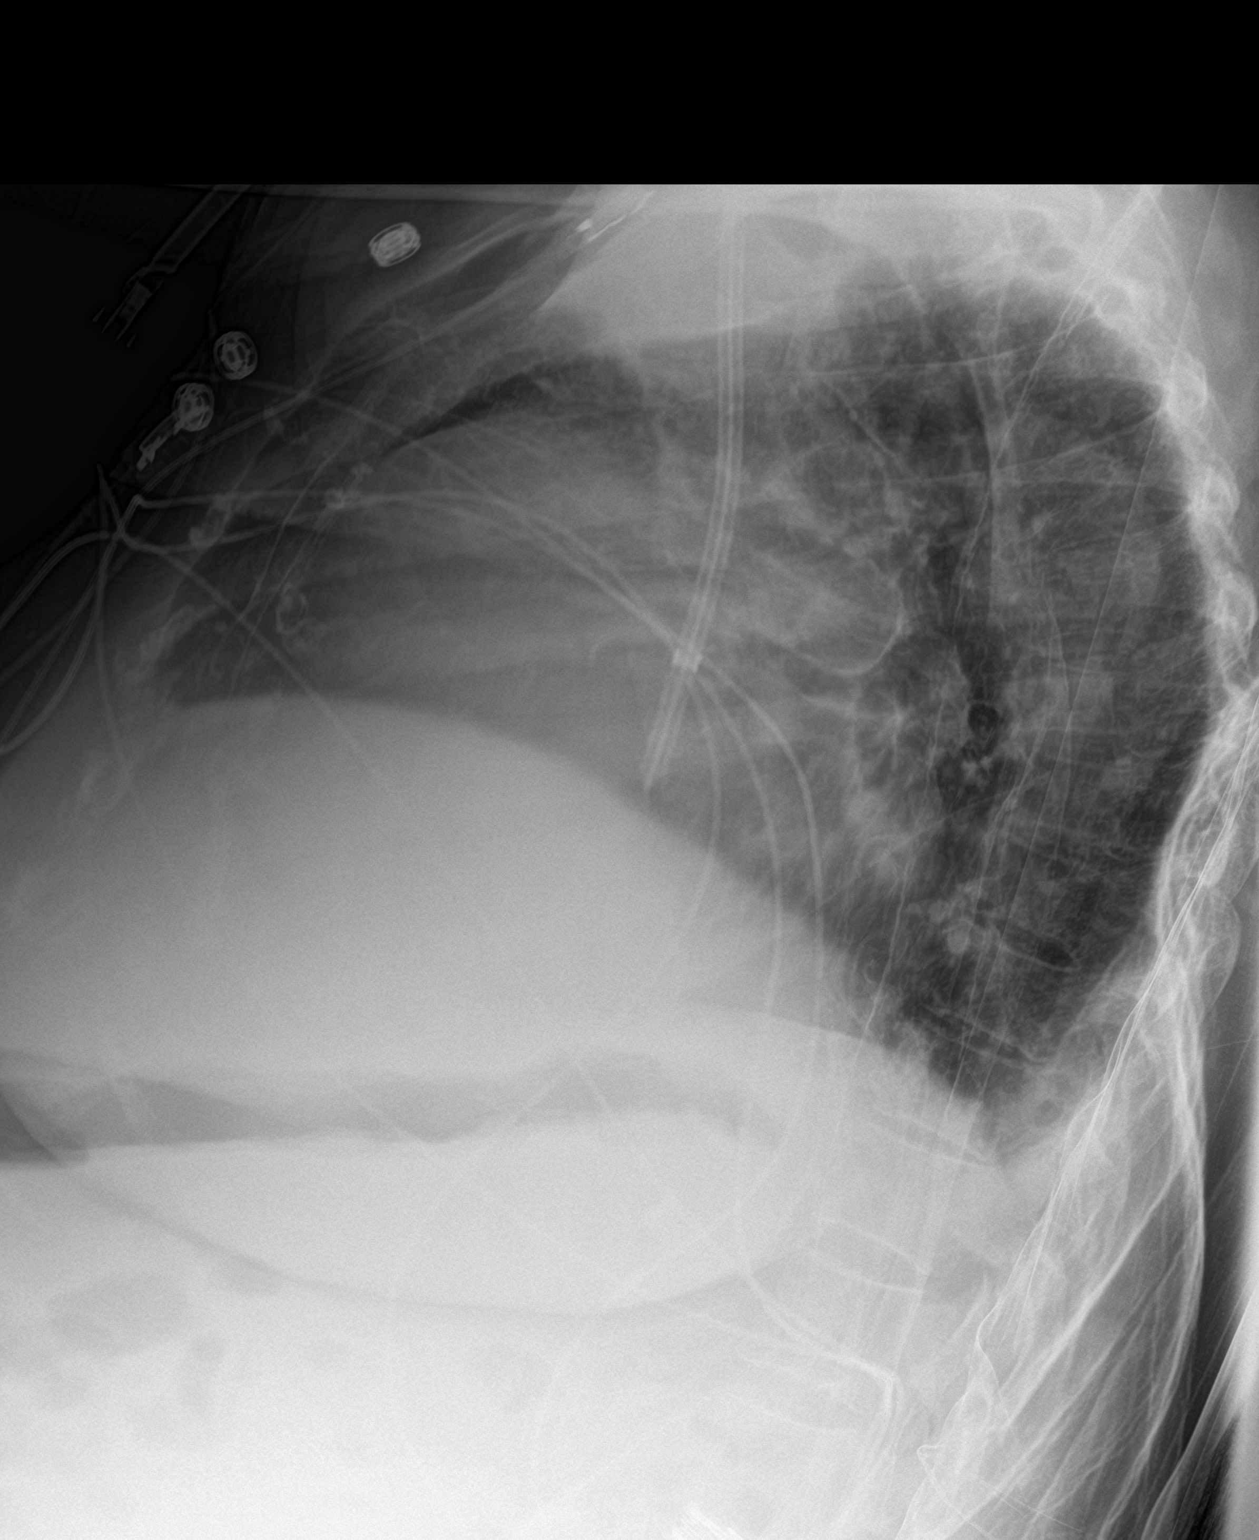

[chest ap]
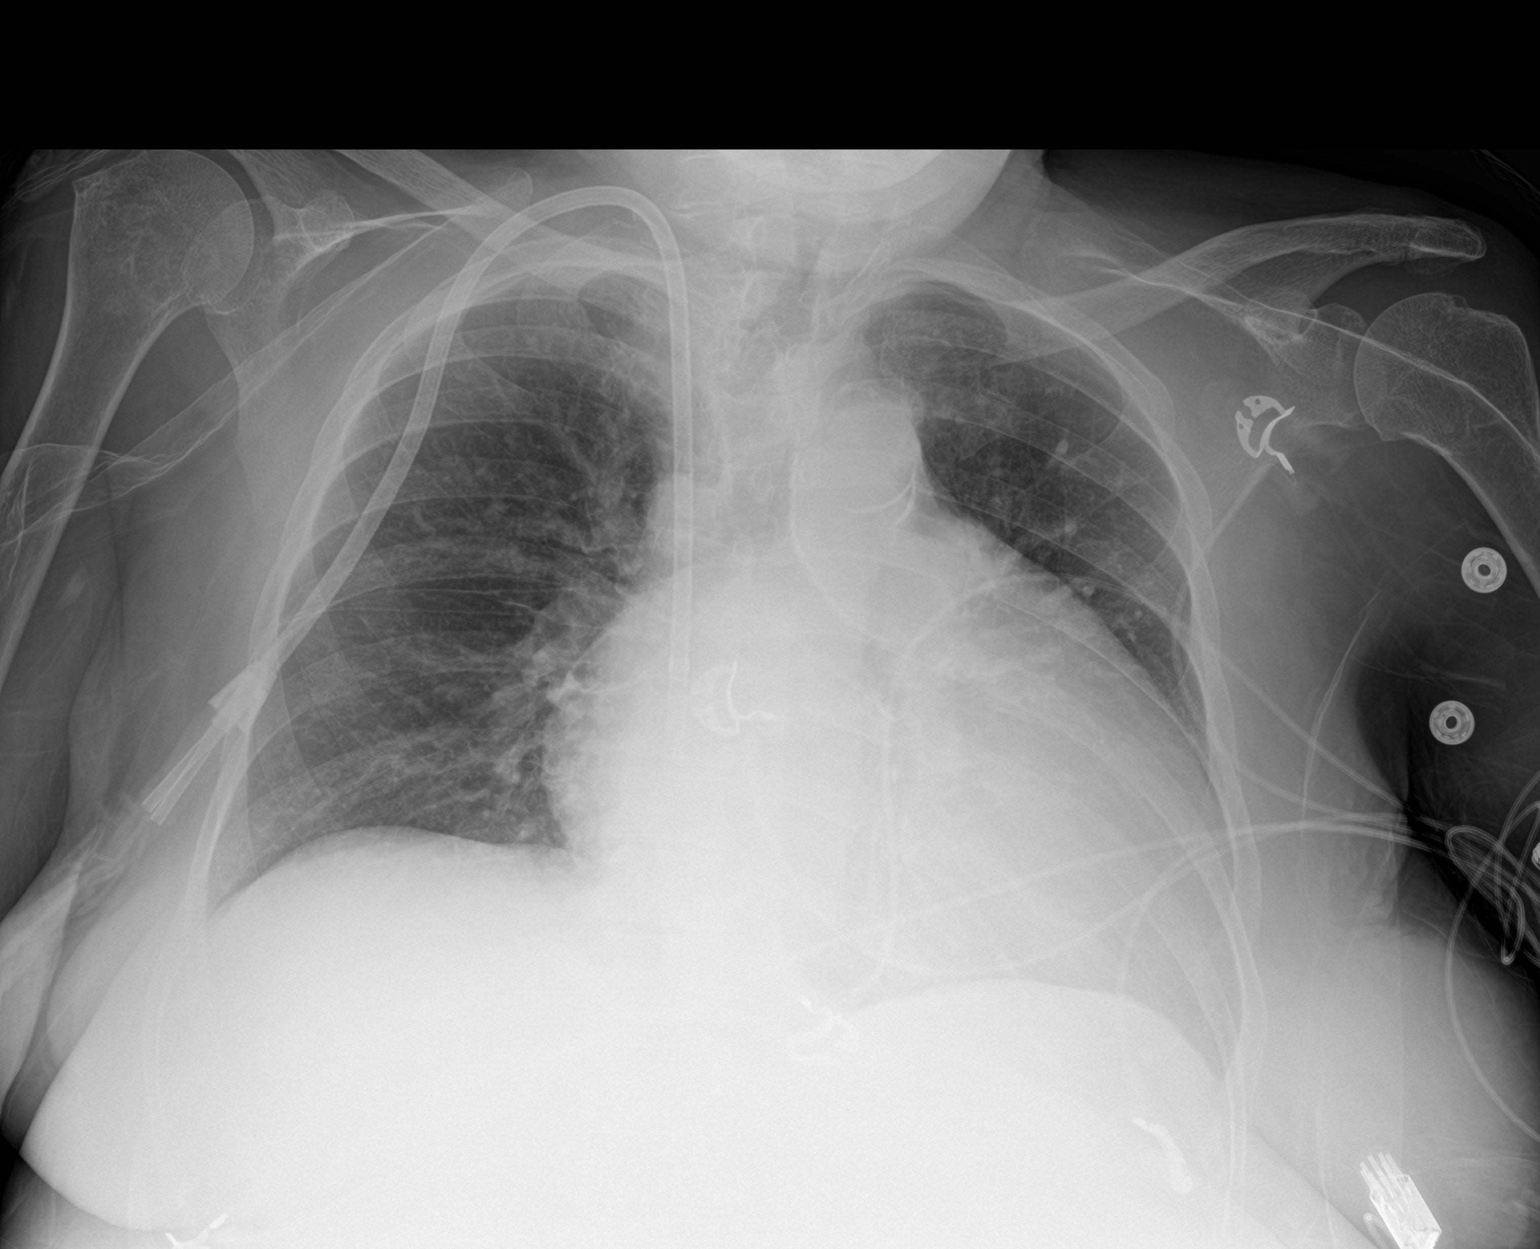

[2 of 2 positions shown; findings below may reference images not displayed]

FINDINGS: Moderate enlargement of the cardiopericardial silhouette stable from
the prior exam.

No mediastinal or hilar masses.  No convincing adenopathy.

Clear lungs.  No pleural effusion or pneumothorax.

Right internal jugular tunneled catheter is stable, tip projecting
in the right atrium.

Skeletal structures are intact.
IMPRESSION: 1. No acute cardiopulmonary disease.
2. Moderate cardiomegaly.

## 2019-01-07 IMAGING — CR DG KNEE 1-2V*L*
3 series · 3 of 3 positions shown · non-contrast
Comparison: 04/18/2006

CLINICAL DATA: Patient experiencing left knee problems. Yesterday
after dialysis her left knee gave out on her and has been unable to
stand since. Arthritis in left knee. No pain in left knee, just
weak. No prior injuries, no prior surgeries to left knee. She has
been experiencing stiffness in this knee for 1 year

EXAM:
LEFT KNEE - 1-2 VIEW

[knee ap]
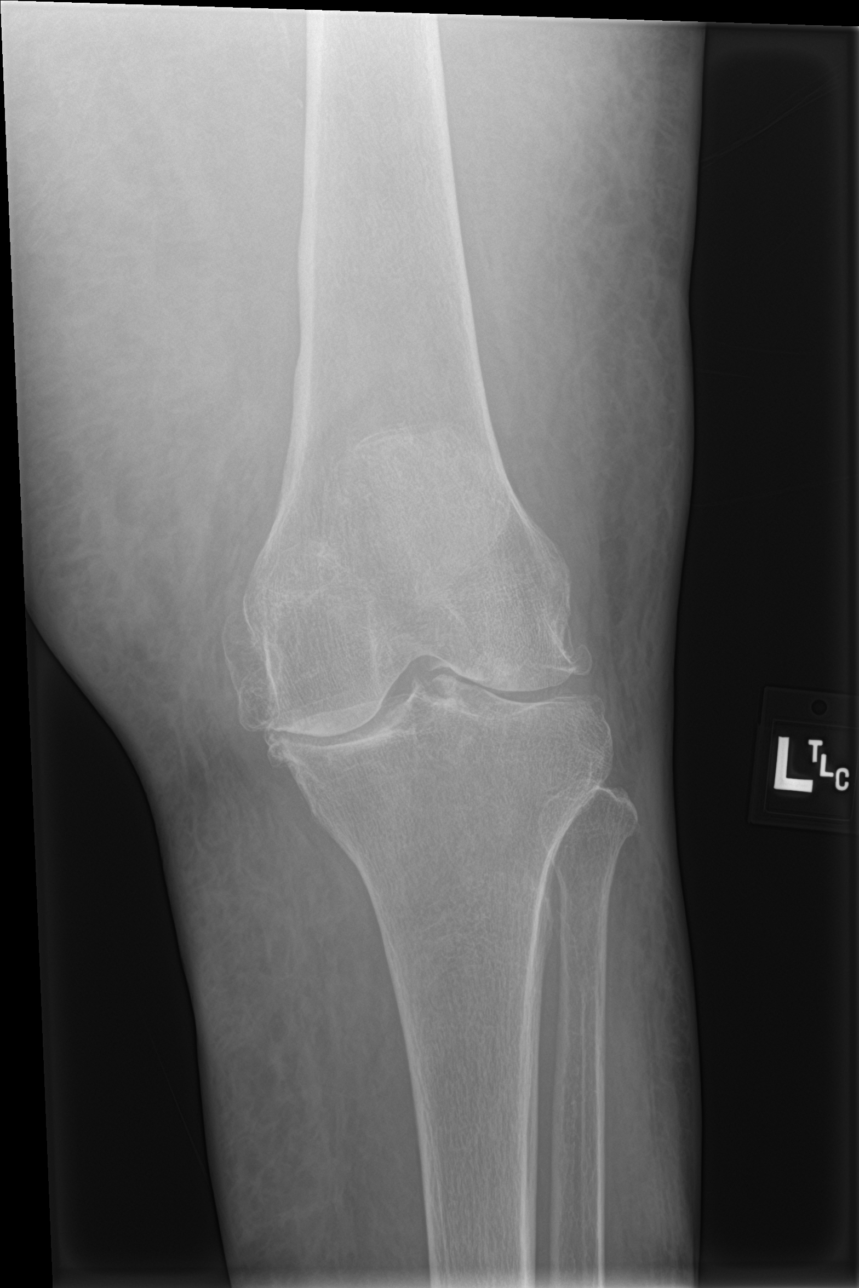

[knee lat (1 of 2)]
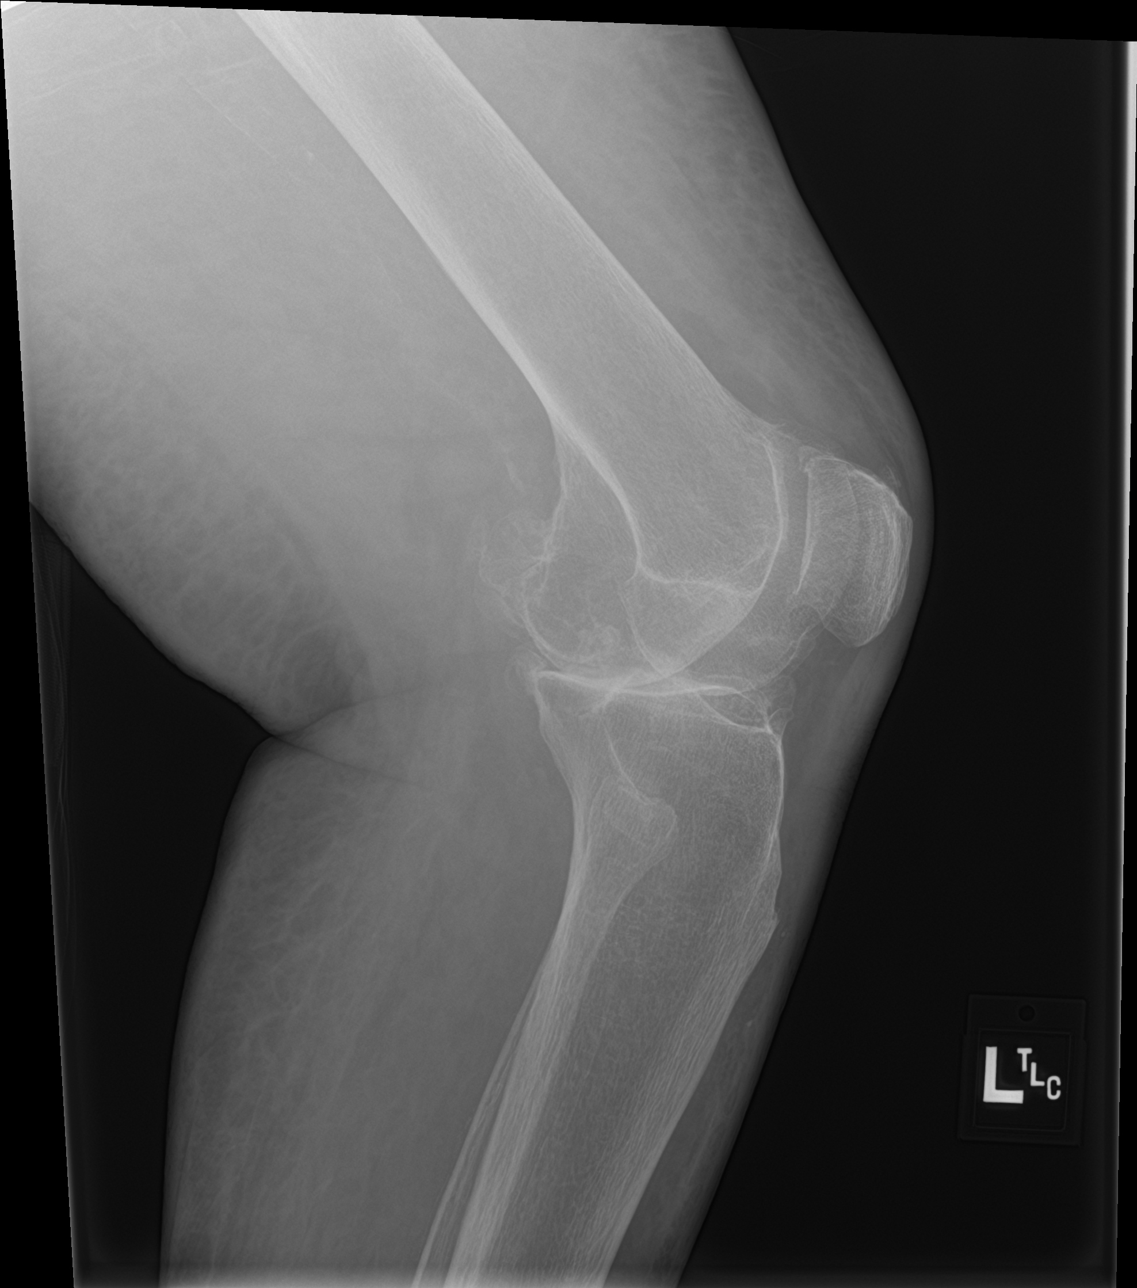

[knee lat (2 of 2)]
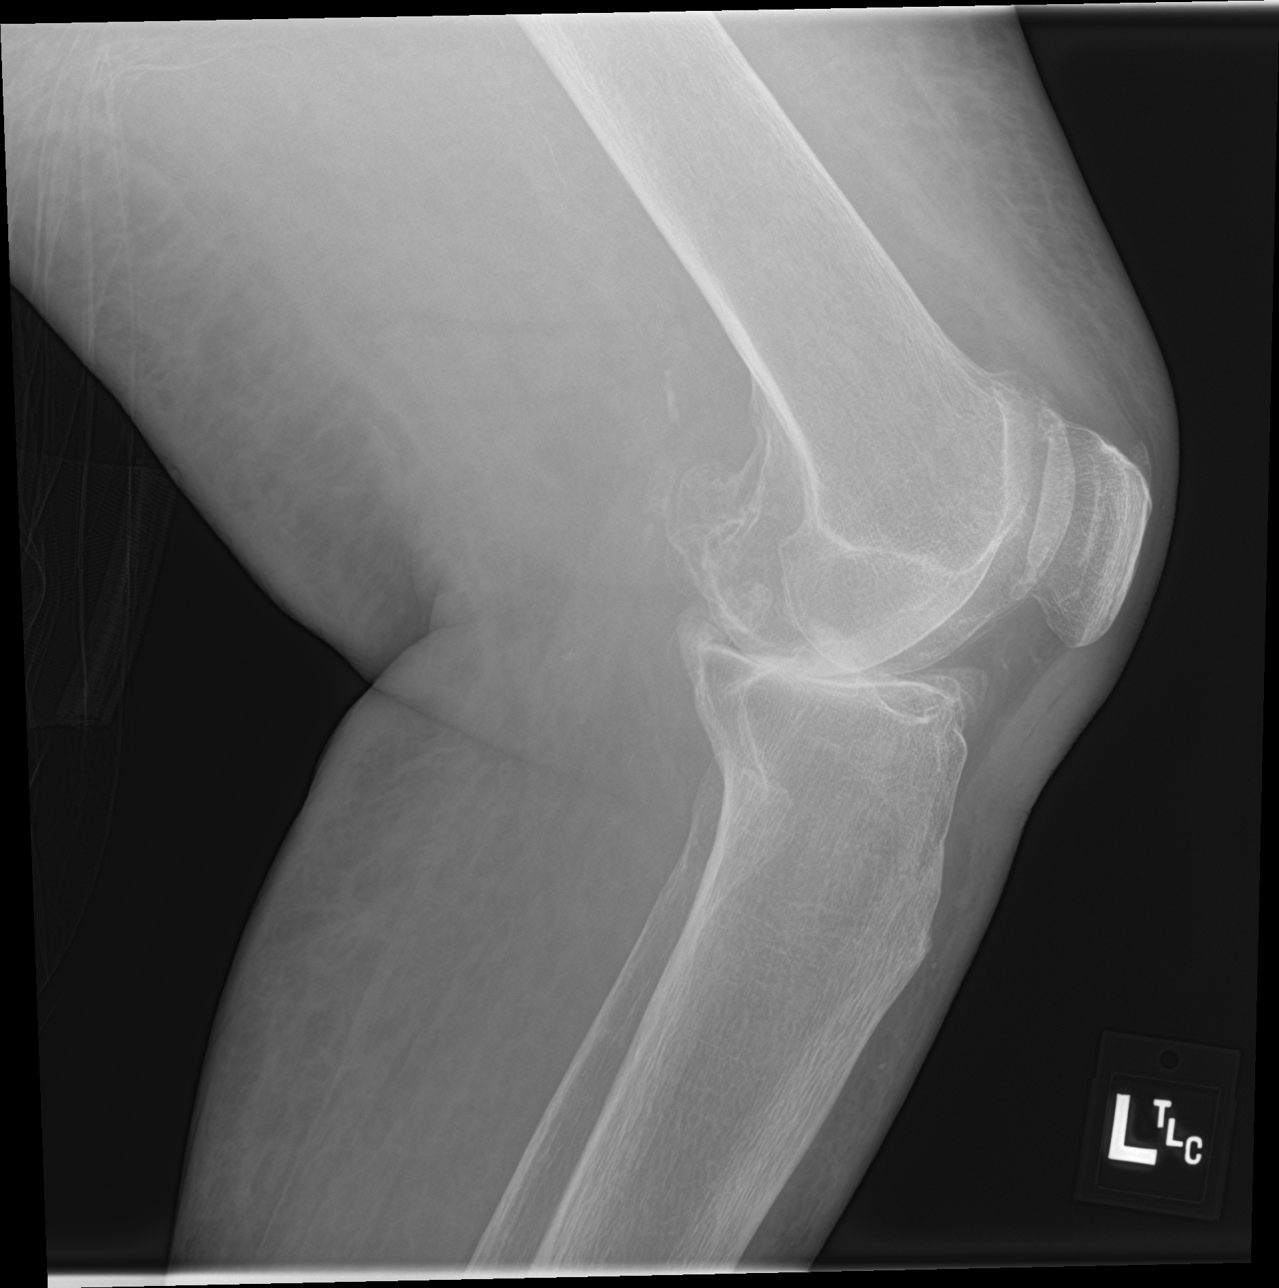

[3 of 3 positions shown; findings below may reference images not displayed]

FINDINGS: No fracture.  No bone lesion.

There is tricompartmental joint space narrowing with marginal
osteophytes. This is greatest involving the medial compartment.
There is mild medial joint space compartment subchondral sclerosis.

No convincing joint effusion.

Bones are demineralized.
IMPRESSION: 1. No fracture or acute finding.
2. Advanced osteoarthritis mildly progressed when compared to the
prior study.
